# Patient Record
Sex: Female | Born: 1935 | Race: Black or African American | Hispanic: No | Smoking: Never smoker
Health system: Southern US, Community
[De-identification: ages and names within clinical notes are randomized; demographics above are authoritative.]

## PROBLEM LIST (undated history)

## (undated) DIAGNOSIS — K219 Gastro-esophageal reflux disease without esophagitis: Secondary | ICD-10-CM

## (undated) DIAGNOSIS — Z9989 Dependence on other enabling machines and devices: Secondary | ICD-10-CM

## (undated) DIAGNOSIS — M25512 Pain in left shoulder: Secondary | ICD-10-CM

## (undated) DIAGNOSIS — I10 Essential (primary) hypertension: Secondary | ICD-10-CM

## (undated) DIAGNOSIS — E119 Type 2 diabetes mellitus without complications: Secondary | ICD-10-CM

## (undated) DIAGNOSIS — E785 Hyperlipidemia, unspecified: Secondary | ICD-10-CM

## (undated) DIAGNOSIS — I509 Heart failure, unspecified: Secondary | ICD-10-CM

## (undated) DIAGNOSIS — M1990 Unspecified osteoarthritis, unspecified site: Secondary | ICD-10-CM

## (undated) DIAGNOSIS — J189 Pneumonia, unspecified organism: Secondary | ICD-10-CM

## (undated) DIAGNOSIS — I219 Acute myocardial infarction, unspecified: Secondary | ICD-10-CM

## (undated) DIAGNOSIS — I4891 Unspecified atrial fibrillation: Principal | ICD-10-CM

## (undated) DIAGNOSIS — G4733 Obstructive sleep apnea (adult) (pediatric): Secondary | ICD-10-CM

## (undated) DIAGNOSIS — C50912 Malignant neoplasm of unspecified site of left female breast: Secondary | ICD-10-CM

## (undated) HISTORY — PX: LAPAROSCOPIC CHOLECYSTECTOMY: SUR755

## (undated) HISTORY — PX: JOINT REPLACEMENT: SHX530

## (undated) HISTORY — PX: CATARACT EXTRACTION W/ INTRAOCULAR LENS  IMPLANT, BILATERAL: SHX1307

---

## 1996-02-07 DIAGNOSIS — C50912 Malignant neoplasm of unspecified site of left female breast: Secondary | ICD-10-CM

## 1996-02-07 HISTORY — PX: MASTECTOMY: SHX3

## 1996-02-07 HISTORY — PX: BREAST BIOPSY: SHX20

## 1996-02-07 HISTORY — DX: Malignant neoplasm of unspecified site of left female breast: C50.912

## 1999-02-07 HISTORY — PX: TOTAL KNEE ARTHROPLASTY: SHX125

## 2007-07-21 ENCOUNTER — Observation Stay (HOSPITAL_COMMUNITY): Admission: EM | Admit: 2007-07-21 | Discharge: 2007-07-22 | Payer: Self-pay | Admitting: Emergency Medicine

## 2008-09-29 ENCOUNTER — Ambulatory Visit: Payer: Self-pay | Admitting: Diagnostic Radiology

## 2008-09-29 ENCOUNTER — Emergency Department (HOSPITAL_BASED_OUTPATIENT_CLINIC_OR_DEPARTMENT_OTHER): Admission: EM | Admit: 2008-09-29 | Discharge: 2008-09-29 | Payer: Self-pay | Admitting: Emergency Medicine

## 2009-04-01 ENCOUNTER — Emergency Department (HOSPITAL_COMMUNITY): Admission: EM | Admit: 2009-04-01 | Discharge: 2009-04-01 | Payer: Self-pay | Admitting: Emergency Medicine

## 2010-04-27 LAB — D-DIMER, QUANTITATIVE: D-Dimer, Quant: 0.36 ug/mL-FEU (ref 0.00–0.48)

## 2010-04-27 LAB — POCT I-STAT, CHEM 8
Calcium, Ion: 1.1 mmol/L — ABNORMAL LOW (ref 1.12–1.32)
Creatinine, Ser: 0.9 mg/dL (ref 0.4–1.2)
HCT: 40 % (ref 36.0–46.0)
Hemoglobin: 13.6 g/dL (ref 12.0–15.0)
Sodium: 138 mEq/L (ref 135–145)
TCO2: 25 mmol/L (ref 0–100)

## 2010-04-27 LAB — POCT CARDIAC MARKERS
CKMB, poc: 2.7 ng/mL (ref 1.0–8.0)
Myoglobin, poc: 111 ng/mL (ref 12–200)
Troponin i, poc: <0.05 ng/mL (ref 0.00–0.09)

## 2010-04-27 LAB — CBC
MCV: 91.2 fL (ref 78.0–100.0)
WBC: 9.7 10*3/uL (ref 4.0–10.5)

## 2010-06-21 NOTE — Discharge Summary (Signed)
[name], [name]            ACCOUNT NO.:  [id number]   MEDICAL RECORD NO.:  [id number]          PATIENT TYPE:  OBV   LOCATION:  5502                         FACILITY:  [facility]   PHYSICIAN:  Altha Harm, MDDATE OF BIRTH:  February 24, 1933   DATE OF ADMISSION:  07/21/2007  DATE OF DISCHARGE:  07/22/2007                               DISCHARGE SUMMARY   DISCHARGE DISPOSITION:  Home.   DISCHARGE DIAGNOSES:  1. Chest pain, atypical.  2. Reported tachycardia, not observed in the hospital.  3. Bilateral paresthesias.  4. History of hypertension.  5. History of hyperlipidemia.  6. History of cancers of the breast, status post left-sided      mastectomy.   DISCHARGE MEDICATIONS:  1. Metoprolol 25 mg p.o. b.i.d.  2. Exforge 10/160 mg p.o. daily.  3. Aspirin 81 mg p.o. daily.  4. Fish oil 1 tablet p.o. daily.  5. Nitroglycerin 0.4 mg sublingual every 5 minutes p.r.n. pain.   CONSULTANTS:  None.   PROCEDURE:  None.   DIAGNOSTIC STUDIES:  1. CT head without contrast, which shows no acute intracranial      findings.  2. Chest x-ray 2 views, which shows mild cardiomegaly and minimal      bibasilar subsegmental atelectasis.   ALLERGIES:  No known drug allergies.   CODE STATUS:  Full code.   PRIMARY CARE PHYSICIAN:  The patient's primary care physician is in  [place], [place].   CHIEF COMPLAINT:  Chest pain and fast heartbeat.   HISTORY OF PRESENT ILLNESS:  Please see the H&P dictated by Dr.  Toniann Fail; however it is reported by the patient to me, the patient  states that she missed several doses of her metoprolol and started  having a fast heart rate starting yesterday.  She states that this has  happened in the past and her doctor had scheduled her to be seen by  cardiologist in [place] on August 13, 2007.  She states that in light of  the fact that she was away from home, she came to the emergency room for  further evaluation.   HOSPITAL COURSE:  The  patient was evaluated for resting cardiac ischemia  with serial enzymes and was found to have none.  The patient had no  tachyarrhythmias while hospitalized and was able to ambulate without any  difficulty or any tachyarrhythmias.  I have discussed this at length  with the patient and she agrees that she will return to [place] to  see her physician as she is out of town.  She also states that she would  rather follow up with her appointment already scheduled with her  cardiologist in [place] for August 13, 2007.   The patient is being discharged home on the above-stated medications.   PHYSICAL EXAMINATION:  VITAL SIGNS:  Today, her vital signs are stable.  Her heart rate is sinus rhythm in the 50s to 70s, blood pressure 119/59,  respiratory rate 14-16, and O2 sats are 96% on room air.  LUNGS: Clear to auscultation.  The patient has no further chest pain or  feelings of a fast heart rate at this  time.  HEART:  She has normal S1 and S2.  No murmurs, rubs, or gallops were  noted.  ABDOMEN:  Obese, soft, nontender, and nondistended.  No masses or  hepatosplenomegaly.  EXTREMITIES:  There are no clubbing, cyanosis, or edema noticed in the  extremities.   DIETARY RESTRICTIONS:  The patient should be on a heart-healthy diet.   PHYSICAL RESTRICTIONS:  None and I would continue her activity,  nonstrenuous activity.   DISCHARGE INSTRUCTIONS:  The patient has been instructed that if she has  any further chest pain, she is to return to the emergency room, if she  has not yet returned back to her home in the [place], [place]  here.  This has all been discussed with the patient's daughter in  addition.      Altha Harm, MD  Electronically Signed     MAM/MEDQ  D:  07/22/2007  T:  07/23/2007  Job:  [id number]

## 2010-06-21 NOTE — H&P (Signed)
[name], BOULE NO.:  [id number]   MEDICAL RECORD NO.:  [id number]          PATIENT TYPE:  EMS   LOCATION:  MAJO                         FACILITY:  [facility]   PHYSICIAN:  Eduard Clos, MDDATE OF BIRTH:  05-18-1933   DATE OF ADMISSION:  07/21/2007  DATE OF DISCHARGE:                              HISTORY & PHYSICAL   CHIEF COMPLAINT:  Chest pain.   HISTORY OF PRESENT ILLNESS:  A 77 year old female with known history of  hypertension presented to the ER complaining of chest pain.  The patient  has been having off and on chest pain for the last 2 weeks.  These  typically last around 10-15 minutes, retrosternal, present even at rest,  not related to any exertion.  In addition to chest pain, the patient  also has associated palpitations along with the chest pain.  Denies any  associated shortness of breath, dizziness, or loss of consciousness.  Does have some numbness in her hands which was also present along the  duration of the chest pain.  Numbness is present in both upper hands and  lower legs in a stocking glove pattern.  Denies any weakness of limbs,  dizziness, or loss of consciousness.  Denies fever, chills, headache,  abdominal pain, nausea, vomiting, or diarrhea.   PAST MEDICAL HISTORY:  Hypertension, history of CA of the breast status  post left-sided mastectomy.   PAST SURGICAL HISTORY:  Left-sided mastectomy for CA breast,  cholecystectomy, and bilateral knee replacement.   MEDICATIONS PRIOR TO ADMISSION:  1. Metoprolol 25 mg p.o. twice daily.  2. Exforge 10/160 mg p.o. daily.  3. Aspirin 81 mg p.o. daily.  4. Fish oil.   ALLERGIES:  No known drug allergies.   FAMILY HISTORY:  Significant for the patient's mother having MI at age  8.   SOCIAL HISTORY:  The patient denies smoking cigarettes, but does snuff  tobacco.  She has been advised to quit snuffing.  She denies any alcohol  or drug abuse.   REVIEW OF SYSTEMS:  As present in  history of present illness.  Nothing  else significant.   PHYSICAL EXAMINATION:  GENERAL:  The patient examined at bedside, not in  acute distress.  Denies any chest pain now.  VITAL SIGNS:  Blood pressure 152/78, pulse 78 per minute, temperature  98.7, respirations 18 per minute, O2 saturation 99%.  HEENT:  Anicteric, no pallor.  CHEST:  Bilateral air entry present.  No rhonchi and no crepitation.  HEART:  S1 and S2 heard.  ABDOMEN:  Soft, nontender, bowel sounds heard.  No guarding and no  rigidity.  NEUROLOGY:  Alert, awake, oriented to time, place, and person.  Moves  upper and lower extremities 5/5.  EXTREMITIES:  Peripheral pulses felt.  No edema.   LABORATORY DATA:  CT of the head shows nothing acute.   Chest x-ray; mild cardiomegaly.  Minimal bibasilar subsegmental  atelectasis.   EKG; normal sinus rhythm with T wave flattening in inferior leads and V1  and lateral leads.   CBC; WBC 9.3, hemoglobin 12.2, hematocrit 36, platelets 304.  Complete  metabolic panel; sodium  139, potassium 3.7, chloride 101, glucose 99,  BUN 7, creatinine 0.9, total bilirubin 0.6, alkaline phosphatase 89, AST  19, ALT 11, total protein 7.7, albumin 4.1.  CK-MB 1.5, troponin less  than 0.05.   ASSESSMENT:  1. Chest pain, rule out acute coronary syndrome.  2. Numbness in the upper and lower extremities probably from      peripheral neuropathy, unable to rule out any transient ischemic      attack.  3. History of hypertension.  4. History of cancer of breast status post left-sided mastectomy.   PLAN:  Admit the patient to telemetry.  We will follow serial cardiac  markers.  We will get an MRI of the brain, carotid ultrasound.  We will  get a two-dimensional echocardiogram and further recommendations as the  patient's condition evolves.      Eduard Clos, MD  Electronically Signed     ANK/MEDQ  D:  07/21/2007  T:  07/21/2007  Job:  [id number]

## 2010-11-03 LAB — HEPATIC FUNCTION PANEL

## 2010-11-03 LAB — CK TOTAL AND CKMB (NOT AT ARMC)
CK, MB: 2.1
Total CK: 203 — ABNORMAL HIGH

## 2010-11-03 LAB — CBC
Hemoglobin: 11.7 — ABNORMAL LOW
MCHC: 33.8
Platelets: 291
RBC: 3.85 — ABNORMAL LOW
RBC: 3.57 — ABNORMAL LOW
RDW: 14.4
WBC: 9.3
WBC: 8.1

## 2010-11-03 LAB — DIFFERENTIAL
Basophils Absolute: 0
Basophils Relative: 1
Eosinophils Absolute: 0.2
Eosinophils Relative: 2
Lymphocytes Relative: 32
Lymphs Abs: 3
Monocytes Absolute: 0.6
Monocytes Relative: 6
Neutro Abs: 5.5

## 2010-11-03 LAB — CARDIAC PANEL(CRET KIN+CKTOT+MB+TROPI)
CK, MB: 1.7
CK, MB: 1.8
Relative Index: 0.9
Total CK: 156
Troponin I: 0.01
Troponin I: <0.01

## 2010-11-03 LAB — LIPID PANEL
HDL: 48
LDL Cholesterol: 107 — ABNORMAL HIGH
Triglycerides: 88
VLDL: 18

## 2010-11-03 LAB — POCT I-STAT, CHEM 8
Calcium, Ion: 1.17
Chloride: 101
Glucose, Bld: 99
Potassium: 3.7

## 2010-11-03 LAB — TROPONIN I: Troponin I: <0.01

## 2010-11-03 LAB — POCT CARDIAC MARKERS
CKMB, poc: 1.5
CKMB, poc: 2.2
Myoglobin, poc: 80.8
Myoglobin, poc: 83.8
Operator id: 277751
Troponin i, poc: <0.05

## 2011-01-25 ENCOUNTER — Emergency Department (HOSPITAL_BASED_OUTPATIENT_CLINIC_OR_DEPARTMENT_OTHER)
Admission: EM | Admit: 2011-01-25 | Discharge: 2011-01-25 | Disposition: A | Discharge status: Home / Self Care | Payer: Medicare Other | Attending: Emergency Medicine | Admitting: Emergency Medicine

## 2011-01-25 ENCOUNTER — Encounter: Payer: Self-pay | Admitting: *Deleted

## 2011-01-25 DIAGNOSIS — M25519 Pain in unspecified shoulder: Secondary | ICD-10-CM | POA: Insufficient documentation

## 2011-01-25 DIAGNOSIS — M75100 Unspecified rotator cuff tear or rupture of unspecified shoulder, not specified as traumatic: Secondary | ICD-10-CM

## 2011-01-25 DIAGNOSIS — E119 Type 2 diabetes mellitus without complications: Secondary | ICD-10-CM | POA: Insufficient documentation

## 2011-01-25 DIAGNOSIS — M67919 Unspecified disorder of synovium and tendon, unspecified shoulder: Secondary | ICD-10-CM | POA: Insufficient documentation

## 2011-01-25 DIAGNOSIS — M719 Bursopathy, unspecified: Secondary | ICD-10-CM | POA: Insufficient documentation

## 2011-01-25 DIAGNOSIS — I10 Essential (primary) hypertension: Secondary | ICD-10-CM | POA: Insufficient documentation

## 2011-01-25 HISTORY — DX: Essential (primary) hypertension: I10

## 2011-01-25 MED ORDER — OXYCODONE-ACETAMINOPHEN 5-325 MG PO TABS: 2.0000 | ORAL_TABLET | ORAL | Status: AC | PRN

## 2011-01-25 NOTE — ED Notes (Signed)
3 week history of left shoulder pain extending down to elbow no injury reported saw pmd last week given tramadol told it is arthritis but pt states not helping at all

## 2011-01-25 NOTE — ED Provider Notes (Signed)
History     CSN: [id number] Arrival date & time: 01/25/2011  9:59 AM   First MD Initiated Contact with Patient 01/25/11 1025      Chief Complaint  Patient presents with  . Shoulder Pain   patient has had chronic left shoulder pain for the past 3 weeks. Especially worsened with range of motion of the joints or palpation. She was seen by her primary care physician last week and given a prescription for tramadol and diagnosed with arthritis. She states she is not sure if she was referred to an orthopedist. The pain is persisting and she states the tramadol is not really helping. She has had no chest pain, no numbness, no diaphoresis. Denies any fevers. No respiratory symptoms. She also has an appointment with her cancer doctor on January 8, which she plans to keep.  (Consider location/radiation/quality/duration/timing/severity/associated sxs/prior treatment) HPI  Past Medical History  Diagnosis Date  . Diabetes mellitus   . Hypertension     History reviewed. No pertinent past surgical history.  History reviewed. No pertinent family history.  History  Substance Use Topics  . Smoking status: Former Games developer  . Smokeless tobacco: Not on file  . Alcohol Use: No    OB History    Grav Para Term Preterm Abortions TAB SAB Ect Mult Living                  Review of Systems  All other systems reviewed and are negative.    Allergies  Review of patient's allergies indicates no known allergies.  Home Medications   Current Outpatient Rx  Name Route Sig Dispense Refill  . AMLODIPINE BESYLATE 5 MG PO TABS Oral Take 5 mg by mouth daily.      Marland Kitchen DRONEDARONE HCL 400 MG PO TABS Oral Take 400 mg by mouth 2 (two) times daily with a meal.      . METFORMIN HCL 500 MG PO TABS Oral Take 500 mg by mouth 2 (two) times daily with a meal.      . METOPROLOL TARTRATE 50 MG PO TABS Oral Take 50 mg by mouth 2 (two) times daily.      . TRAMADOL HCL 50 MG PO TABS Oral Take 50 mg by mouth every 6 (six)  hours as needed. Maximum dose= 8 tablets per day     . OXYCODONE-ACETAMINOPHEN 5-325 MG PO TABS Oral Take 2 tablets by mouth every 4 (four) hours as needed for pain. 15 tablet 0    BP 165/87  Pulse 88  Temp(Src) 98.4 F (36.9 C) (Oral)  Resp 20  SpO2 99%  Physical Exam  Nursing note and vitals reviewed. Constitutional: She is oriented to person, place, and time. She appears well-developed and well-nourished.  HENT:  Head: Normocephalic and atraumatic.  Eyes: Conjunctivae and EOM are normal. Pupils are equal, round, and reactive to light.  Neck: Neck supple.  Cardiovascular: Normal rate and regular rhythm.  Exam reveals no gallop and no friction rub.   No murmur heard. Pulmonary/Chest: Breath sounds normal. She has no wheezes. She has no rales. She exhibits no tenderness.  Abdominal: Soft. Bowel sounds are normal. She exhibits no distension. There is no tenderness. There is no rebound and no guarding.  Musculoskeletal: She exhibits tenderness. She exhibits no edema.       Pulses are equal and symmetric. No redness, swelling, or edema. Painful range of motion of the left shoulder joint. Diffuse tenderness. No crepitance. No other abnormal finding.  Neurological: She is  alert and oriented to person, place, and time. No cranial nerve deficit. Coordination normal.  Skin: Skin is warm and dry. No rash noted.  Psychiatric: She has a normal mood and affect.    ED Course  Procedures (including critical care time)  Labs Reviewed - No data to display No results found.   1. Shoulder pain   2. Rotator cuff syndrome       MDM  Pt is seen and examined;  Initial history and physical completed.  Will follow.          Trevell Pariseau A. Patrica Duel, MD 01/25/11 1038

## 2011-05-03 ENCOUNTER — Emergency Department (INDEPENDENT_AMBULATORY_CARE_PROVIDER_SITE_OTHER): Payer: Medicare Other

## 2011-05-03 ENCOUNTER — Other Ambulatory Visit: Payer: Self-pay

## 2011-05-03 ENCOUNTER — Encounter (HOSPITAL_BASED_OUTPATIENT_CLINIC_OR_DEPARTMENT_OTHER): Payer: Self-pay | Admitting: Emergency Medicine

## 2011-05-03 ENCOUNTER — Inpatient Hospital Stay (HOSPITAL_BASED_OUTPATIENT_CLINIC_OR_DEPARTMENT_OTHER)
Admission: EM | Admit: 2011-05-03 | Discharge: 2011-05-05 | DRG: 310 | Disposition: A | Discharge status: Home / Self Care | Payer: Medicare Other | Source: Ambulatory Visit | Attending: Internal Medicine | Admitting: Internal Medicine

## 2011-05-03 DIAGNOSIS — M25512 Pain in left shoulder: Secondary | ICD-10-CM | POA: Diagnosis present

## 2011-05-03 DIAGNOSIS — E785 Hyperlipidemia, unspecified: Secondary | ICD-10-CM | POA: Diagnosis present

## 2011-05-03 DIAGNOSIS — Z79899 Other long term (current) drug therapy: Secondary | ICD-10-CM

## 2011-05-03 DIAGNOSIS — M25519 Pain in unspecified shoulder: Secondary | ICD-10-CM | POA: Diagnosis present

## 2011-05-03 DIAGNOSIS — Z853 Personal history of malignant neoplasm of breast: Secondary | ICD-10-CM

## 2011-05-03 DIAGNOSIS — E119 Type 2 diabetes mellitus without complications: Secondary | ICD-10-CM | POA: Diagnosis present

## 2011-05-03 DIAGNOSIS — R079 Chest pain, unspecified: Secondary | ICD-10-CM

## 2011-05-03 DIAGNOSIS — K219 Gastro-esophageal reflux disease without esophagitis: Secondary | ICD-10-CM | POA: Diagnosis present

## 2011-05-03 DIAGNOSIS — I4891 Unspecified atrial fibrillation: Principal | ICD-10-CM | POA: Diagnosis present

## 2011-05-03 DIAGNOSIS — I10 Essential (primary) hypertension: Secondary | ICD-10-CM | POA: Diagnosis present

## 2011-05-03 DIAGNOSIS — I509 Heart failure, unspecified: Secondary | ICD-10-CM

## 2011-05-03 DIAGNOSIS — I517 Cardiomegaly: Secondary | ICD-10-CM

## 2011-05-03 DIAGNOSIS — R0602 Shortness of breath: Secondary | ICD-10-CM

## 2011-05-03 HISTORY — DX: Gastro-esophageal reflux disease without esophagitis: K21.9

## 2011-05-03 HISTORY — DX: Pain in left shoulder: M25.512

## 2011-05-03 HISTORY — DX: Hyperlipidemia, unspecified: E78.5

## 2011-05-03 HISTORY — DX: Unspecified atrial fibrillation: I48.91

## 2011-05-03 HISTORY — DX: Malignant neoplasm of unspecified site of left female breast: C50.912

## 2011-05-03 LAB — BASIC METABOLIC PANEL
Calcium: 9.7 mg/dL (ref 8.4–10.5)
Creatinine, Ser: 1 mg/dL (ref 0.50–1.10)
GFR calc Af Amer: 62 mL/min — ABNORMAL LOW (ref >90)

## 2011-05-03 LAB — DIFFERENTIAL
Basophils Absolute: 0 10*3/uL (ref 0.0–0.1)
Basophils Relative: 0 % (ref 0–1)
Eosinophils Relative: 2 % (ref 0–5)
Monocytes Absolute: 0.6 10*3/uL (ref 0.1–1.0)

## 2011-05-03 LAB — CBC
HCT: 38.3 % (ref 36.0–46.0)
MCH: 30.4 pg (ref 26.0–34.0)
MCHC: 33.7 g/dL (ref 30.0–36.0)
MCV: 90.3 fL (ref 78.0–100.0)
RDW: 14.1 % (ref 11.5–15.5)

## 2011-05-03 LAB — GLUCOSE, CAPILLARY
Glucose-Capillary: 69 mg/dL — ABNORMAL LOW (ref 70–99)
Glucose-Capillary: 81 mg/dL (ref 70–99)

## 2011-05-03 LAB — APTT: aPTT: 34 seconds (ref 24–37)

## 2011-05-03 MED ORDER — ZOLPIDEM TARTRATE 5 MG PO TABS: 5.0000 mg | ORAL_TABLET | Freq: Every evening | ORAL | Status: DC | PRN

## 2011-05-03 MED ORDER — SODIUM CHLORIDE 0.9 % IJ SOLN
3.0000 mL | Freq: Two times a day (BID) | INTRAMUSCULAR | Status: DC
  Administered 2011-05-04: 3 mL via INTRAVENOUS

## 2011-05-03 MED ORDER — PREGABALIN 50 MG PO CAPS
150.0000 mg | ORAL_CAPSULE | Freq: Every day | ORAL | Status: DC
Start: 2011-05-03 — End: 2011-05-05
  Administered 2011-05-03 – 2011-05-04 (×2): 150 mg via ORAL
  Filled 2011-05-03: qty 1
  Filled 2011-05-03: qty 2
  Filled 2011-05-03: qty 3

## 2011-05-03 MED ORDER — RIVAROXABAN 10 MG PO TABS
20.0000 mg | ORAL_TABLET | Freq: Every day | ORAL | Status: DC
  Administered 2011-05-03 – 2011-05-05 (×3): 20 mg via ORAL
  Filled 2011-05-03 (×3): qty 2

## 2011-05-03 MED ORDER — ASPIRIN 81 MG PO CHEW
CHEWABLE_TABLET | ORAL | Status: AC
  Administered 2011-05-03: 324 mg
  Filled 2011-05-03: qty 4

## 2011-05-03 MED ORDER — DILTIAZEM HCL 25 MG/5ML IV SOLN
10.0000 mg | Freq: Once | INTRAVENOUS | Status: AC
  Administered 2011-05-03: 10 mg via INTRAVENOUS
  Filled 2011-05-03: qty 5

## 2011-05-03 MED ORDER — SODIUM CHLORIDE 0.9 % IJ SOLN: 3.0000 mL | INTRAMUSCULAR | Status: DC | PRN

## 2011-05-03 MED ORDER — DILTIAZEM HCL 100 MG IV SOLR
INTRAVENOUS | Status: AC
  Administered 2011-05-03: 16:00:00
  Filled 2011-05-03: qty 100

## 2011-05-03 MED ORDER — INSULIN ASPART 100 UNIT/ML ~~LOC~~ SOLN: 0.0000 [IU] | Freq: Three times a day (TID) | SUBCUTANEOUS | Status: DC

## 2011-05-03 MED ORDER — ASPIRIN 81 MG PO TABS
324.0000 mg | ORAL_TABLET | Freq: Once | ORAL | Status: DC
  Administered 2011-05-03: 324 mg via ORAL

## 2011-05-03 MED ORDER — NITROGLYCERIN 0.4 MG SL SUBL: 0.4000 mg | SUBLINGUAL_TABLET | SUBLINGUAL | Status: DC | PRN

## 2011-05-03 MED ORDER — DILTIAZEM HCL 60 MG PO TABS
120.0000 mg | ORAL_TABLET | Freq: Every day | ORAL | Status: DC
  Filled 2011-05-03: qty 2

## 2011-05-03 MED ORDER — ASPIRIN 81 MG PO CHEW: 324.0000 mg | CHEWABLE_TABLET | Freq: Once | ORAL | Status: DC

## 2011-05-03 MED ORDER — SODIUM CHLORIDE 0.9 % IV BOLUS (SEPSIS)
500.0000 mL | Freq: Once | INTRAVENOUS | Status: AC
  Administered 2011-05-03: 500 mL via INTRAVENOUS

## 2011-05-03 MED ORDER — SODIUM CHLORIDE 0.9 % IV SOLN: 250.0000 mL | INTRAVENOUS | Status: DC | PRN

## 2011-05-03 MED ORDER — ONDANSETRON HCL 4 MG/2ML IJ SOLN: 4.0000 mg | Freq: Four times a day (QID) | INTRAMUSCULAR | Status: DC | PRN

## 2011-05-03 MED ORDER — DILTIAZEM HCL 100 MG IV SOLR
5.0000 mg/h | Freq: Once | INTRAVENOUS | Status: AC
  Administered 2011-05-03: 5 mg/h via INTRAVENOUS

## 2011-05-03 MED ORDER — ACETAMINOPHEN 325 MG PO TABS
650.0000 mg | ORAL_TABLET | ORAL | Status: DC | PRN
  Administered 2011-05-04: 650 mg via ORAL
  Filled 2011-05-03: qty 2

## 2011-05-03 MED ORDER — METOPROLOL TARTRATE 50 MG PO TABS
50.0000 mg | ORAL_TABLET | Freq: Two times a day (BID) | ORAL | Status: DC
  Administered 2011-05-04 – 2011-05-05 (×3): 50 mg via ORAL
  Filled 2011-05-03 (×5): qty 1

## 2011-05-03 NOTE — H&P (Signed)
History and Physical  Patient ID: DILLYN JOAQUIN MRN: [id number], SOB: 07/17/1933 78 y.o. Date of Encounter: 05/03/2011, 9:13 PM  Primary Physician:  Primary Cardiologist: New to [facility]  Chief Complaint: Left shoulder pain and A.fib w/ RVR  HPI: 78 y.o. female w/ PMHx significant for A.Fib, HTN, HLD, and diabetes who presented to [facility] with c/o of left shoulder pain and was found to be in A. Fib w/ RVR and transferred to [facility] on 05/03/2011 .  She reports being diagnosed with A.Fib about 57yrs ago at which time she was placed on metoprolol and Multaq, but no anticoagulation. She denies DCCV. She moved to [place] to live with her daughter 3 years ago and has not seen cardiology since moving. She reports having chronic left shoulder pain for about three years. It was evaluated with a MRI in January and was told she has a pinched nerve for which she has received injections. She has had worsening of her pain over the last 2 months that has not been relieved with oxycodone or lyrica so she [facility]. While there she was noted to be in A. Fib w/ rates 150s. She reports feeling palpitations with mild dizziness this morning and states that she has occasional palpitations, maybe every other week, but usually only when she is in pain. She denies chest pain, sob, or syncope. She states she has "poor balance" but denies falls. She is not active and therefore unable to determine whether she experiences exertional symptoms. She denies recent illness, fever, chills, changes in bladder or bowels. She does report that she doesn't always take her meds as prescribed, stating she is forgetful.   At the [facility] labs were unremarkable with normal troponin, CXR revealed minimal CHF with stable cardiomegaly and minimal interstitial pulmonary edema. She received IV cardizem followed by a drip with improvement of heart rates 80s-90s. Upon arrival to [facility] she is still in A. Fib with rates in the 60s-70s on 5mg /hr. She denies chest pain, sob, or palpitations. She is euvolemic on exam.  Past Medical History  Diagnosis Date  . Diabetes mellitus   . Hypertension   . Atrial fibrillation     Diagnosed ~2009  . GERD (gastroesophageal reflux disease)   . Breast cancer, left breast     S/p chemo and mastectomy 1998  . Hyperlipidemia   . Left shoulder pain     "MRI showed pinched nerve" - per pt     Surgical History:  Past Surgical History  Procedure Date  . Cholecystectomy   . Knee surgery     left  . Mastectomy     left, 1998     Home Meds: Medication Sig  amLODipine (NORVASC) 5 MG tablet Take 5 mg by mouth daily.    dronedarone (MULTAQ) 400 MG tablet Take 400 mg by mouth 2 (two) times daily with a meal.    metFORMIN (GLUCOPHAGE) 500 MG tablet Take 500 mg by mouth 2 (two) times daily with a meal.    metoprolol (LOPRESSOR) 50 MG tablet Take 50 mg by mouth 2 (two) times daily.    pregabalin (LYRICA) 75 MG capsule Take 150 mg by mouth at bedtime.     Allergies: No Known Allergies  Social History  . Marital Status: Single   Occupational History  . Retired   Social History Main Topics  . Smoking status: Never Smoker   . Smokeless tobacco: Current User  Types: Chew  . Alcohol Use: No  . Drug Use: No  . Sexually Active: No   Social History Narrative   Moved from the [place] area to [place] ~ 84yrs ago to live with her daughter     Family History  Problem Relation Age of Onset  . Heart attack Mother     63s    Review of Systems General: negative for chills, fever, night sweats or weight changes.  Cardiovascular: (+) palpitations, right ankle edema; negative for chest pain, shortness of breath, dyspnea on exertion, orthopnea, paroxysmal nocturnal dyspnea  Dermatological: negative for rash Respiratory: negative for cough or wheezing Urologic: negative for hematuria Msk: (+) Left shoulder pain Abdominal:  negative for nausea, vomiting, diarrhea, bright red blood per rectum, melena, or hematemesis Neurologic: (+) Dizziness; negative for visual changes, syncope All other systems reviewed and are otherwise negative except as noted above.  Labs:  Component Value Date   WBC 6.8 05/03/2011   HGB 12.9 05/03/2011   HCT 38.3 05/03/2011   MCV 90.3 05/03/2011   PLT 331 05/03/2011    Lab 05/03/11 1505  NA 138  K 3.7  CL 102  CO2 26  BUN 13  CREATININE 1.00  CALCIUM 9.7  GLUCOSE 102*   Basename 05/03/11 1505  CKTOTAL --  CKMB --  TROPONINI <0.30     Radiology/Studies:  Dg Chest Portable 1 View  05/03/2011  *RADIOLOGY REPORT*  Clinical Data: Chest pain.  Shortness of breath.  Dizziness. History diabetes, hypertension, and atrial fibrillation.  PORTABLE CHEST - 1 VIEW 05/03/2011 1530 hours:  Comparison: Portable chest x-ray 04/01/2009 and two-view chest x- ray 07/21/2007 [facility].  Findings: Cardiac silhouette enlarged but stable, allowing for differences in technique.  Thoracic aorta tortuous atherosclerotic, unchanged.  Hilar and mediastinal contours otherwise unremarkable. Pulmonary venous hypertension with perhaps minimal interstitial pulmonary edema.  No confluent airspace consolidation.  Prior left mastectomy and axillary node dissection.  IMPRESSION: Minimal/incipient CHF, with stable cardiomegaly and perhaps minimal interstitial pulmonary edema.  Original Report Authenticated By: Arnell Sieving, M.D.     EKG: 05/03/11 @ 1459 - A. Fib w/ RVR 150bpm  05/03/11 @ 1525 - A. Fib   Physical Exam: Blood pressure 131/80, pulse 77, temperature 98.1 F (36.7 C), temperature source Oral, resp. rate 16, height 5\' 6"  (1.676 m), weight 150 lb (68.04 kg), SpO2 97.00%. General: Elderly black female in no acute distress. Head: Normocephalic, atraumatic, sclera non-icteric, nares are without discharge Neck: Supple. Negative for carotid bruits. JVD not elevated. Chest: Left mastectomy Lungs:  Distant, diminished breath sounds throughout without wheezes, rales, or rhonchi. Breathing is unlabored. Heart: Irregularly irregular with S1 S2. Modest systolic murmur, no rub or gallop appreciated. Abdomen: Soft, non-tender, non-distended with normoactive bowel sounds. No rebound/guarding. No obvious abdominal masses. Msk:  Strength and tone appear normal for age. Extremities: No edema. No clubbing or cyanosis. Distal pedal pulses are 2+ and equal bilaterally. Neuro: Alert and oriented X 3. Moves all extremities spontaneously. Psych:  Responds to questions appropriately with a normal affect.    ASSESSMENT AND PLAN:  78 y.o. female w/ PMHx significant for A.Fib, HTN, HLD, and DMII who presented to [facility] with c/o of left shoulder pain and was found to be in A. Fib w/ RVR and transferred to [facility] on 05/03/2011 .  1. A. Fib w/ RVR: Has a history of A. Fib diagnosed about 39yrs ago and placed on BB and Multaq. Was seen at [facility]  in 2009 presenting with possible tachyarrhythmia but found to be in NSR. [facility] with shoulder pain and found to be in a.fib w/ RVR. Unsure of onset. Rate controlled with Cardizem. No anginal symptoms. CHADS2 score 3 (HTN, Age, DM; Echo pending to assess LV function). Will need anticoagulation, but due to questionable medication compliance would favor Xarelto. Will start now and have case management help her with determining cost. CrCl 52.89ml/min. Stop Multaq and Amlodipine. Cont Metoprolol. Cont IV cardizem over night and switch to oral in the morning. Check TSH.  2. HTN: Stable. Meds as above  3. HLD: Lipid panel & LFTs in AM. With a history of diabetes, she will likely require treatment with a statin.  4. DMII: Hold Metformin. Place on SSI. Check A1C.  Signed, HOPE, JESSICA PA-C 05/03/2011, 9:13 PM   Cardiology Attending Patient interviewed and examined. Discussed with [facility].  Above note annotated and  modified based upon my findings.  H/O AF, but the pattern of occurrence is uncertain.  Likely has PAF that had been fairly well contolled with dronedarone.  We need records from pt's cardiologist in [place] and PCP in [place].  For now antiarrhythmics will be discontinued, and we will pursue a rate control strategy with anticoagulation.  TSH and echo pending.  CXR interpreted as mild pulmonary edema, but there are no signs of CHF on exam.  Will defer treatment for now and check a BNP level.  Convert from IV to PO diltiazem in AM.  If she is doing and feeling well, consider discharge tomorrow afternoon.  Albion Bing, MD 05/03/2011, 10:45 PM

## 2011-05-03 NOTE — Progress Notes (Signed)
cardizem iv that patient had infusing on admission is cut off.

## 2011-05-03 NOTE — ED Notes (Signed)
Pt. Is in A Fib at present time.

## 2011-05-03 NOTE — ED Notes (Signed)
Pt. Has IV Cardizem infusing with no s/s of distress .

## 2011-05-03 NOTE — ED Notes (Signed)
Pt declines w/c, amb to room, gait noted to be unsteady initially, pt states "I feel dizzy..." pt reports left sided intermittant chest pain x Thursday with sob, and dizzyness onset today.

## 2011-05-03 NOTE — ED Notes (Signed)
Informed Pt. And Pt. Family of being transported by [facility] soon.

## 2011-05-03 NOTE — ED Provider Notes (Addendum)
History     CSN: [id number]  Arrival date & time 05/03/11  1448   First MD Initiated Contact with Patient 05/03/11 1501      Chief Complaint  Patient presents with  . Chest Pain  . Shortness of Breath  . Dizziness    (Consider location/radiation/quality/duration/timing/severity/associated sxs/prior treatment) HPI Comments: Patient presents today for her left shoulder and arm pain.  She notes that this is been ongoing for several weeks to months.  She's also noted over the last few days that she's felt some mild intermittent left-sided chest pain and some dizziness.  She's noted that she's had a problem with elevated heart rate before and believes this may be going on again today.  She's not currently taking any antiplatelet or anticoagulation agents.  She does note that he ongoing shoulder and neck pain she did receive some steroid injections.  Patient denies any prior coronary artery disease her cardiac stenting.  She does have a history of hypertension.  Patient is a 78 y.o. female presenting with shoulder pain. The history is provided by the patient. No language interpreter was used.  Shoulder Pain Associated symptoms include chest pain and shortness of breath. Pertinent negatives include no abdominal pain and no headaches.    Past Medical History  Diagnosis Date  . Diabetes mellitus   . Hypertension   . Atrial fibrillation   . GERD (gastroesophageal reflux disease)     Past Surgical History  Procedure Date  . Cholecystectomy   . Knee surgery     History reviewed. No pertinent family history.  History  Substance Use Topics  . Smoking status: Former Games developer  . Smokeless tobacco: Not on file  . Alcohol Use: No    OB History    Grav Para Term Preterm Abortions TAB SAB Ect Mult Living                  Review of Systems  Constitutional: Negative.  Negative for fever and chills.  HENT: Negative.   Eyes: Negative.  Negative for discharge and redness.  Respiratory:  Positive for shortness of breath. Negative for cough.   Cardiovascular: Positive for chest pain.  Gastrointestinal: Negative.  Negative for nausea, vomiting, abdominal pain and diarrhea.  Genitourinary: Negative.  Negative for dysuria and vaginal discharge.  Musculoskeletal: Negative for back pain.  Skin: Negative.  Negative for color change and rash.  Neurological: Positive for dizziness. Negative for syncope and headaches.  Hematological: Negative.  Negative for adenopathy.  Psychiatric/Behavioral: Negative.  Negative for confusion.  All other systems reviewed and are negative.    Allergies  Review of patient's allergies indicates no known allergies.  Home Medications   Current Outpatient Rx  Name Route Sig Dispense Refill  . AMLODIPINE BESYLATE 5 MG PO TABS Oral Take 5 mg by mouth daily.      Marland Kitchen DRONEDARONE HCL 400 MG PO TABS Oral Take 400 mg by mouth 2 (two) times daily with a meal.      . METFORMIN HCL 500 MG PO TABS Oral Take 500 mg by mouth 2 (two) times daily with a meal.      . METOPROLOL TARTRATE 50 MG PO TABS Oral Take 50 mg by mouth 2 (two) times daily.      . TRAMADOL HCL 50 MG PO TABS Oral Take 50 mg by mouth every 6 (six) hours as needed. Maximum dose= 8 tablets per day       BP 128/87  Pulse 113  Temp(Src) 98.4  F (36.9 C) (Oral)  Resp 18  Ht 5\' 6"  (1.676 m)  Wt 150 lb (68.04 kg)  BMI 24.21 kg/m2  SpO2 100%  Physical Exam  Nursing note and vitals reviewed. Constitutional: She is oriented to person, place, and time. She appears well-developed and well-nourished.  Non-toxic appearance. She does not have a sickly appearance.  HENT:  Head: Normocephalic and atraumatic.  Eyes: Conjunctivae, EOM and lids are normal. Pupils are equal, round, and reactive to light. No scleral icterus.  Neck: Trachea normal and normal range of motion. Neck supple.  Cardiovascular: S1 normal, S2 normal and normal heart sounds.  An irregularly irregular rhythm present. Tachycardia  present.   Pulmonary/Chest: Effort normal and breath sounds normal. No respiratory distress. She has no wheezes. She has no rales.  Abdominal: Soft. Normal appearance. There is no tenderness. There is no rebound, no guarding and no CVA tenderness.  Musculoskeletal: Normal range of motion.  Neurological: She is alert and oriented to person, place, and time. She has normal strength.  Skin: Skin is warm, dry and intact. No rash noted.  Psychiatric: She has a normal mood and affect. Her behavior is normal. Judgment and thought content normal.    ED Course  Procedures (including critical care time)  Results for orders placed during the hospital encounter of 05/03/11  CBC      Component Value Range   WBC 6.8  4.0 - 10.5 (K/uL)   RBC 4.24  3.87 - 5.11 (MIL/uL)   Hemoglobin 12.9  12.0 - 15.0 (g/dL)   HCT 84.6  96.2 - 95.2 (%)   MCV 90.3  78.0 - 100.0 (fL)   MCH 30.4  26.0 - 34.0 (pg)   MCHC 33.7  30.0 - 36.0 (g/dL)   RDW 84.1  32.4 - 40.1 (%)   Platelets 331  150 - 400 (K/uL)  DIFFERENTIAL      Component Value Range   Neutrophils Relative 48  43 - 77 (%)   Neutro Abs 3.2  1.7 - 7.7 (K/uL)   Lymphocytes Relative 42  12 - 46 (%)   Lymphs Abs 2.9  0.7 - 4.0 (K/uL)   Monocytes Relative 8  3 - 12 (%)   Monocytes Absolute 0.6  0.1 - 1.0 (K/uL)   Eosinophils Relative 2  0 - 5 (%)   Eosinophils Absolute 0.1  0.0 - 0.7 (K/uL)   Basophils Relative 0  0 - 1 (%)   Basophils Absolute 0.0  0.0 - 0.1 (K/uL)  BASIC METABOLIC PANEL      Component Value Range   Sodium 138  135 - 145 (mEq/L)   Potassium 3.7  3.5 - 5.1 (mEq/L)   Chloride 102  96 - 112 (mEq/L)   CO2 26  19 - 32 (mEq/L)   Glucose, Bld 102 (*) 70 - 99 (mg/dL)   BUN 13  6 - 23 (mg/dL)   Creatinine, Ser 0.27  0.50 - 1.10 (mg/dL)   Calcium 9.7  8.4 - 25.3 (mg/dL)   GFR calc non Af Amer 54 (*) >90 (mL/min)   GFR calc Af Amer 62 (*) >90 (mL/min)  APTT      Component Value Range   aPTT 34  24 - 37 (seconds)  PROTIME-INR      Component  Value Range   Prothrombin Time 13.8  11.6 - 15.2 (seconds)   INR 1.04  0.00 - 1.49   TROPONIN I      Component Value Range   Troponin I <0.30  <  0.30 (ng/mL)   Dg Chest Portable 1 View  05/03/2011  *RADIOLOGY REPORT*  Clinical Data: Chest pain.  Shortness of breath.  Dizziness. History diabetes, hypertension, and atrial fibrillation.  PORTABLE CHEST - 1 VIEW 05/03/2011 1530 hours:  Comparison: Portable chest x-ray 04/01/2009 and two-view chest x- ray 07/21/2007 [facility].  Findings: Cardiac silhouette enlarged but stable, allowing for differences in technique.  Thoracic aorta tortuous atherosclerotic, unchanged.  Hilar and mediastinal contours otherwise unremarkable. Pulmonary venous hypertension with perhaps minimal interstitial pulmonary edema.  No confluent airspace consolidation.  Prior left mastectomy and axillary node dissection.  IMPRESSION: Minimal/incipient CHF, with stable cardiomegaly and perhaps minimal interstitial pulmonary edema.  Original Report Authenticated By: Arnell Sieving, M.D.       Date: 05/03/2011  Rate: 150  Rhythm: atrial fibrillation  QRS Axis: normal  Intervals: normal  ST/T Wave abnormalities: nonspecific ST/T changes  Conduction Disutrbances:none  Narrative Interpretation:   Old EKG Reviewed: changed from old ECG from 04/01/09 where pt was in NSR, but other old ECGs do show a-fib but unclear of date of those ECGs (not labeled on ECG)   Date: 05/03/2011 repeat @1525   Rate: 99  Rhythm: atrial fibrillation  QRS Axis: normal  Intervals: normal  ST/T Wave abnormalities: Flattened T waves diffusely  Conduction Disutrbances:none  Narrative Interpretation:   Old EKG Reviewed: changes noted from earlier ECG with RVR   MDM  Patient with A. fib with RVR on her initial EKG.  Patient was given an aspirin here and will be placed on a diltiazem bolus and interpret treatment.  Her heart rate has decreased to 80s-90s.  Her blood pressures have maintained  normal levels.  Patient's cardiac markers are normal and her laboratory studies.  Her oxygenation is good so although there is possible minimal CHF on her chest x-ray clinically the patient is not having any difficulty with her breathing.  I do believe this patient will warrant transfer for admission for A. fib with RVR.  Patient prefers to go to [facility] and does not have a cardiologist of her own but I will contact the cardiology service on call for unassigned patients for transfer potentially to their service.  Patient's primary care physician is unclear as multiple papers from the patient mentioned of her doctors and she's not sure which one of them is her primary care physician.  CRITICAL CARE Performed by: Emeline General A   Total critical care time: 35 minutes  Critical care time was exclusive of separately billable procedures and treating other patients.  Critical care was necessary to treat or prevent imminent or life-threatening deterioration.  Critical care was time spent personally by me on the following activities: development of treatment plan with patient and/or surrogate as well as nursing, discussions with consultants, evaluation of patient's response to treatment, examination of patient, obtaining history from patient or surrogate, ordering and performing treatments and interventions, ordering and review of laboratory studies, ordering and review of radiographic studies, pulse oximetry and re-evaluation of patient's condition.         Nat Christen, MD 05/03/11 1606  Pt discussed with Dr. Johney Frame from cardiology and he accepted the pt to a tele bed.    Nat Christen, MD 05/03/11 1626

## 2011-05-04 ENCOUNTER — Other Ambulatory Visit: Payer: Self-pay

## 2011-05-04 ENCOUNTER — Inpatient Hospital Stay (HOSPITAL_COMMUNITY): Payer: Medicare Other

## 2011-05-04 DIAGNOSIS — I059 Rheumatic mitral valve disease, unspecified: Secondary | ICD-10-CM

## 2011-05-04 LAB — LIPID PANEL
Cholesterol: 186 mg/dL (ref 0–200)
HDL: 53 mg/dL (ref >39)
Total CHOL/HDL Ratio: 3.5 RATIO
VLDL: 31 mg/dL (ref 0–40)

## 2011-05-04 LAB — COMPREHENSIVE METABOLIC PANEL WITH GFR
ALT: 8 U/L (ref 0–35)
AST: 17 U/L (ref 0–37)
Albumin: 3.3 g/dL — ABNORMAL LOW (ref 3.5–5.2)
Alkaline Phosphatase: 60 U/L (ref 39–117)
BUN: 10 mg/dL (ref 6–23)
CO2: 22 meq/L (ref 19–32)
Calcium: 9.2 mg/dL (ref 8.4–10.5)
Chloride: 106 meq/L (ref 96–112)
Creatinine, Ser: 0.77 mg/dL (ref 0.50–1.10)
GFR calc Af Amer: >90 mL/min
GFR calc non Af Amer: 80 mL/min — ABNORMAL LOW
Glucose, Bld: 98 mg/dL (ref 70–99)
Potassium: 4.2 meq/L (ref 3.5–5.1)
Sodium: 138 meq/L (ref 135–145)
Total Bilirubin: 0.3 mg/dL (ref 0.3–1.2)
Total Protein: 6.7 g/dL (ref 6.0–8.3)

## 2011-05-04 LAB — GLUCOSE, CAPILLARY
Glucose-Capillary: 89 mg/dL (ref 70–99)
Glucose-Capillary: 83 mg/dL (ref 70–99)

## 2011-05-04 LAB — CBC
HCT: 36.2 % (ref 36.0–46.0)
Hemoglobin: 11.9 g/dL — ABNORMAL LOW (ref 12.0–15.0)
MCH: 30.5 pg (ref 26.0–34.0)
MCHC: 32.9 g/dL (ref 30.0–36.0)
MCV: 92.8 fL (ref 78.0–100.0)
Platelets: 290 10*3/uL (ref 150–400)
RBC: 3.9 MIL/uL (ref 3.87–5.11)
RDW: 14.5 % (ref 11.5–15.5)
WBC: 6.2 10*3/uL (ref 4.0–10.5)

## 2011-05-04 LAB — TSH: TSH: 1.007 u[IU]/mL (ref 0.350–4.500)

## 2011-05-04 LAB — HEMOGLOBIN A1C
Hgb A1c MFr Bld: 6.6 % — ABNORMAL HIGH (ref <5.7)
Mean Plasma Glucose: 143 mg/dL — ABNORMAL HIGH (ref <117)

## 2011-05-04 MED ORDER — DILTIAZEM HCL ER COATED BEADS 120 MG PO CP24
120.0000 mg | ORAL_CAPSULE | Freq: Every day | ORAL | Status: DC
  Administered 2011-05-04 – 2011-05-05 (×2): 120 mg via ORAL
  Filled 2011-05-04 (×3): qty 1

## 2011-05-04 MED FILL — Aspirin Chew Tab 81 MG: ORAL | Qty: 4 | Status: AC

## 2011-05-04 NOTE — Progress Notes (Signed)
   CARE MANAGEMENT NOTE 05/04/2011  Patient:  [name], [name]   Account Number:  [id number]  Date Initiated:  05/04/2011  Documentation initiated by:  GRAVES-BIGELOW,Linzy Darling  Subjective/Objective Assessment:   Pt admitted with Left shoulder pain and A.fib w/ RVR.Xarelto benefits check in process. Pt usually gets medications filled at [facility] on [address]. The system is down and CM is not able to get a cost for xarelto.     Action/Plan:   Anticipated DC Date:  05/05/2011   Anticipated DC Plan:  HOME/SELF CARE      DC Planning Services  CM consult      Choice offered to / List presented to:             Status of service:  Completed, signed off Medicare Important Message given?   (If response is "NO", the following Medicare IM given date fields will be blank) Date Medicare IM given:   Date Additional Medicare IM given:    Discharge Disposition:  HOME/SELF CARE  Per UR Regulation:    If discussed at Long Length of Stay Meetings, dates discussed:    Comments:  05-04-11 1524 Tomi Bamberger, RN,BSN [phone] CM will continue to monitor for co pay cost in am. Pt states she usually pays 1.50 to 3.00 for medications.

## 2011-05-04 NOTE — Progress Notes (Signed)
UR Completed. Simmons, Rishikesh Khachatryan F [phone]  

## 2011-05-04 NOTE — Progress Notes (Signed)
Subjective:  Patient is still having mild left shoulder pain which is worse when she moves.  No chest pain.  NO dyspnea.  Objective:  Vital Signs in the last 24 hours: Temp:  [97.9 F (36.6 C)-98.4 F (36.9 C)] 97.9 F (36.6 C) (03/28 0359) Pulse Rate:  [70-113] 70  (03/28 0359) Resp:  [16-18] 18  (03/28 0359) BP: (109-131)/(70-92) 110/70 mmHg (03/28 0359) SpO2:  [95 %-100 %] 95 % (03/28 0359) FiO2 (%):  [2 %] 2 % (03/27 1746) Weight:  [68.04 kg (150 lb)] 68.04 kg (150 lb) (03/27 1502)  Intake/Output from previous day: 03/27 0701 - 03/28 0700 In: 365.1 [P.O.:120; I.V.:245.1] Out: -  Intake/Output from this shift:       . aspirin      . diltiazem (CARDIZEM) infusion  5-15 mg/hr Intravenous Once  . diltiazem      . diltiazem  10 mg Intravenous Once  . diltiazem  120 mg Oral Daily  . insulin aspart  0-15 Units Subcutaneous TID WC  . metoprolol  50 mg Oral BID  . pregabalin  150 mg Oral QHS  . rivaroxaban  20 mg Oral QAC supper  . sodium chloride  500 mL Intravenous Once  . sodium chloride  3 mL Intravenous Q12H  . DISCONTD: aspirin  324 mg Oral Once  . DISCONTD: aspirin  324 mg Oral Once      Physical Exam: The patient appears to be in no distress.  Head and neck exam reveals that the pupils are equal and reactive.  The extraocular movements are full.  There is no scleral icterus.  Mouth and pharynx are benign.  No lymphadenopathy.  No carotid bruits.  The jugular venous pressure is normal.  Thyroid is not enlarged or tender.  Chest is clear to percussion and auscultation.  No rales or rhonchi.  Expansion of the chest is symmetrical.  Heart reveals no abnormal lift or heave.  First and second heart sounds are normal.  There is no  gallop rub or click. No significant murmur heard this am  The abdomen is soft and nontender.  Bowel sounds are normoactive.  There is no hepatosplenomegaly or mass.  There are no abdominal bruits.  Extremities reveal no phlebitis or  edema.  Pedal pulses are good.  There is no cyanosis or clubbing.  Neurologic exam is normal strength and no lateralizing weakness.  No sensory deficits.  Integument reveals no rash  Lab Results:  Basename 05/04/11 0635 05/03/11 1505  WBC 6.2 6.8  HGB 11.9* 12.9  PLT 290 331    Basename 05/04/11 0635 05/03/11 1505  NA 138 138  K 4.2 3.7  CL 106 102  CO2 22 26  GLUCOSE 98 102*  BUN 10 13  CREATININE 0.77 1.00    Basename 05/03/11 1505  TROPONINI <0.30   Hepatic Function Panel  Basename 05/04/11 0635  PROT 6.7  ALBUMIN 3.3*  AST 17  ALT 8  ALKPHOS 60  BILITOT 0.3  BILIDIR --  IBILI --    Basename 05/04/11 0635  CHOL 186   No results found for this basename: PROTIME in the last 72 hours  Imaging: Dg Chest Portable 1 View  05/03/2011  *RADIOLOGY REPORT*  Clinical Data: Chest pain.  Shortness of breath.  Dizziness. History diabetes, hypertension, and atrial fibrillation.  PORTABLE CHEST - 1 VIEW 05/03/2011 1530 hours:  Comparison: Portable chest x-ray 04/01/2009 and two-view chest x- ray 07/21/2007 [facility].  Findings: Cardiac silhouette enlarged but  stable, allowing for differences in technique.  Thoracic aorta tortuous atherosclerotic, unchanged.  Hilar and mediastinal contours otherwise unremarkable. Pulmonary venous hypertension with perhaps minimal interstitial pulmonary edema.  No confluent airspace consolidation.  Prior left mastectomy and axillary node dissection.  IMPRESSION: Minimal/incipient CHF, with stable cardiomegaly and perhaps minimal interstitial pulmonary edema.  Original Report Authenticated By: Arnell Sieving, M.D.    Cardiac Studies: 2D echo pending.  EKG shows low voltage, no ischemic changes. Assessment/Plan:  Patient Active Hospital Problem List:  1) Atrial fib not known whether paroxysmal or established.  Not on anticoagulation at home.         Plan: Rate control, anticoagulation (with Xarelto if patient can afford)  2)  Left shoulder pain probably musculoskeletal         Plan: shoulder xray today  Plan: Probably home in am   LOS: 1 day    Cassell Clement 05/04/2011, 7:54 AM

## 2011-05-04 NOTE — Progress Notes (Signed)
  Echocardiogram 2D Echocardiogram has been performed.  [name] A 05/04/2011, 3:35 PM

## 2011-05-05 ENCOUNTER — Other Ambulatory Visit: Payer: Self-pay

## 2011-05-05 DIAGNOSIS — I4891 Unspecified atrial fibrillation: Principal | ICD-10-CM | POA: Diagnosis present

## 2011-05-05 DIAGNOSIS — M25512 Pain in left shoulder: Secondary | ICD-10-CM | POA: Diagnosis present

## 2011-05-05 DIAGNOSIS — I10 Essential (primary) hypertension: Secondary | ICD-10-CM | POA: Diagnosis present

## 2011-05-05 LAB — GLUCOSE, CAPILLARY: Glucose-Capillary: 106 mg/dL — ABNORMAL HIGH (ref 70–99)

## 2011-05-05 MED ORDER — DILTIAZEM HCL ER COATED BEADS 120 MG PO CP24: 120.0000 mg | ORAL_CAPSULE | Freq: Every day | ORAL | Status: DC

## 2011-05-05 MED ORDER — RIVAROXABAN 20 MG PO TABS: 20.0000 mg | ORAL_TABLET | Freq: Every day | ORAL | Status: DC

## 2011-05-05 NOTE — Progress Notes (Signed)
05-05-11 0840 CM did Call [facility] on [address] and they have xarelto 20 mg po available at cost of 3.50. Gala Lewandowsky, RN,BSN [phone].

## 2011-05-05 NOTE — Discharge Summary (Signed)
CARDIOLOGY DISCHARGE SUMMARY   Patient ID: [name] MRN: [id number] DOB/AGE: 07-14-1933 78 y.o.  Admit date: 05/03/2011 Discharge date: 05/05/2011  Primary Discharge Diagnosis:  PAF Secondary Discharge Diagnosis:  Patient Active Problem List  Diagnoses  . Atrial fibrillation  . Left shoulder pain  . Hypertension   Hospital Course: [name] is a 78 year-old female with a history of PAF. She went to [facility] with c/o of left shoulder pain and was found to be in A. Fib w/ RVR. She was transferred to [facility] for further evaluation and treatment.  Her shoulder was x-rayed and found to have no acute process. She was started on IV Cardizem and spontaneously converted to SR. The Cardizem was then changed to PO. Her Norvasc was discontinued. She was started on Xarelto for anticoagulation and case management checked to make sure her insurance would cover it and her pharmacy carried it.   On 05/05/2011, she was evaluated by Dr Daleen Squibb. She was ambulating without chest pain or SOB and is considered stable for discharge, to follow up as an outpatient.  Labs:   Lab Results  Component Value Date   WBC 6.2 05/04/2011   HGB 11.9* 05/04/2011   HCT 36.2 05/04/2011   MCV 92.8 05/04/2011   PLT 290 05/04/2011    Lab 05/04/11 0635  NA 138  K 4.2  CL 106  CO2 22  BUN 10  CREATININE 0.77  CALCIUM 9.2  PROT 6.7  BILITOT 0.3  ALKPHOS 60  ALT 8  AST 17  GLUCOSE 98    Basename 05/03/11 1505  CKTOTAL --  CKMB --  CKMBINDEX --  TROPONINI <0.30   Lipid Panel     Component Value Date/Time   CHOL 186 05/04/2011 0635   TRIG 157* 05/04/2011 0635   HDL 53 05/04/2011 0635   CHOLHDL 3.5 05/04/2011 0635   VLDL 31 05/04/2011 0635   LDLCALC 102* 05/04/2011 0635    Pro B Natriuretic peptide (BNP)  Date/Time Value Range Status  05/04/2011  6:35 AM 1043.0* 0-450 (pg/mL) Final    Basename 05/03/11 1505  INR 1.04       Radiology: Dg Chest Portable 1 View  05/03/2011   *RADIOLOGY REPORT*  Clinical Data: Chest pain.  Shortness of breath.  Dizziness. History diabetes, hypertension, and atrial fibrillation.  PORTABLE CHEST - 1 VIEW 05/03/2011 1530 hours:  Comparison: Portable chest x-ray 04/01/2009 and two-view chest x- ray 07/21/2007 [facility].  Findings: Cardiac silhouette enlarged but stable, allowing for differences in technique.  Thoracic aorta tortuous atherosclerotic, unchanged.  Hilar and mediastinal contours otherwise unremarkable. Pulmonary venous hypertension with perhaps minimal interstitial pulmonary edema.  No confluent airspace consolidation.  Prior left mastectomy and axillary node dissection.  IMPRESSION: Minimal/incipient CHF, with stable cardiomegaly and perhaps minimal interstitial pulmonary edema.  Original Report Authenticated By: Arnell Sieving, M.D.   Dg Shoulder Left  05/04/2011  *RADIOLOGY REPORT*  Clinical Data: Left shoulder pain.  Bursitis.  LEFT SHOULDER - 2+ VIEW  Comparison: None.  Findings: Left shoulder is located.  Internal and external rotation views appear normal.  Visualized left chest appears within normal limits.  Left axillary dissection clips are present.  There is no fracture.  Mild to moderate AC joint osteoarthritis.  Type 2 acromion.  IMPRESSION: No acute osseous abnormality.  AC joint osteoarthritis.  Original Report Authenticated By: Andreas Newport, M.D.    EKG: 04-May-2011 07:56:12  Atrial fibrillation Low voltage QRS Nonspecific T wave abnormality ,  probably digitalis effect Vent. rate 71 BPM PR interval * Cassandra QRS duration 78 Cassandra QT/QTc 406/441 Cassandra P-R-T axes * 44 68  Echo: 05/04/2011 Study Conclusions - Left ventricle: Poor image quality. Abnormal septal motion ? mild diffuse hypokinesis Consider MRI for more accurate EF The cavity size was normal. Ronnel Zuercher thickness was normal. Systolic function was mildly reduced. The estimated ejection fraction was in the range of 45% to 50%. Diffuse hypokinesis. -  Mitral valve: Calcified annulus. Mildly thickened leaflets . Mild regurgitation. - Atrial septum: No defect or patent foramen ovale was identified. - Pericardium, extracardiac: A trivial pericardial effusion was identified posterior to the heart.    FOLLOW UP PLANS AND APPOINTMENTS Discharge Orders    Future Orders Please Complete By Expires   Diet Carb Modified      Increase activity slowly        No Known Allergies Medication List  As of 05/05/2011  5:04 PM   STOP taking these medications         amLODipine 5 MG tablet      dronedarone 400 MG tablet         TAKE these medications         diltiazem 120 MG 24 hr capsule   Commonly known as: CARDIZEM CD   Take 1 capsule (120 mg total) by mouth daily.      metFORMIN 500 MG tablet   Commonly known as: GLUCOPHAGE   Take 500 mg by mouth 2 (two) times daily with a meal.      metoprolol 50 MG tablet   Commonly known as: LOPRESSOR   Take 50 mg by mouth 2 (two) times daily.      pregabalin 75 MG capsule   Commonly known as: LYRICA   Take 150 mg by mouth at bedtime.      Rivaroxaban 20 MG Tabs   Take 20 mg by mouth daily before supper.        [name] will call you with a follow-up appt.     BRING ALL MEDICATIONS WITH YOU TO FOLLOW UP APPOINTMENTS  Time spent with patient to include physician time: 35 min Signed: Theodore Demark 05/05/2011, 5:04 PM Co-Sign MD  Jesse Sans. Daleen Squibb, MD, [facility] Pager:  [phone]

## 2011-05-05 NOTE — Progress Notes (Signed)
Patient ambulated the 'circle' and down to the end of the long hall with the NT without any difficulty earlier this morning.  Baird Lyons 2:16 PM

## 2011-05-05 NOTE — Discharge Instructions (Signed)
[facility] will call with a follow-up appointment. [phone] [address] [place], [place] [address]

## 2011-05-05 NOTE — Progress Notes (Signed)
Patient Name: [name] Date of Encounter: 05/05/2011  Active Problems:  Atrial fibrillation  Left shoulder pain  Hypertension   SUBJECTIVE: Feels well. Denies chest pain or SOB. No palps. Has not been OOB much or amb in halls.   OBJECTIVE  Filed Vitals:   05/03/11 1935 05/04/11 0359 05/04/11 1426 05/04/11 2049  BP: 131/80 110/70 112/73 105/65  Pulse: 77 70 62 63  Temp: 98.1 F (36.7 C) 97.9 F (36.6 C) 97.8 F (36.6 C) 98.7 F (37.1 C)  TempSrc: Oral Oral Oral Oral  Resp: 16 18 17 18   Height:      Weight:      SpO2: 97% 95% 96% 94%    Intake/Output Summary (Last 24 hours) at 05/05/11 0640 Last data filed at 05/04/11 1800  Gross per 24 hour  Intake    720 ml  Output    400 ml  Net    320 ml   Weight change:  Wt Readings from Last 3 Encounters:  05/03/11 150 lb (68.04 kg)     PHYSICAL EXAM  General: Well developed, well nourished, AA female in no acute distress. Head: Normocephalic, atraumatic.  Neck: Supple without bruits, JVD slightly elevated. Lungs:  Resp regular and unlabored, decreased breath sounds in bases with no crackles noted. Heart: Irregular R&R, S1, S2, no S3, S4, or murmurs. Abdomen: Soft, non-tender, non-distended, BS + x 4.  Extremities: No clubbing, cyanosis, no edema.  Neuro: Alert and oriented X 3. Moves all extremities spontaneously. Psych: Normal affect.  LABS:  CBC: Basename 05/04/11 0635 05/03/11 1505  WBC 6.2 6.8  NEUTROABS -- 3.2  HGB 11.9* 12.9  HCT 36.2 38.3  MCV 92.8 90.3  PLT 290 331   INR: Basename 05/03/11 1505  INR 1.04   Basic Metabolic Panel: Basename 05/04/11 0635 05/03/11 1505  NA 138 138  K 4.2 3.7  CL 106 102  CO2 22 26  GLUCOSE 98 102*  BUN 10 13  CREATININE 0.77 1.00  CALCIUM 9.2 9.7  MG -- --  PHOS -- --   Liver Function Tests: [employer] 05/04/11 0635  AST 17  ALT 8  ALKPHOS 60  BILITOT 0.3  PROT 6.7  ALBUMIN 3.3*   Cardiac Enzymes: Basename 05/03/11 1505  CKTOTAL --    CKMB --  CKMBINDEX --  TROPONINI <0.30   BNP: Pro B Natriuretic peptide (BNP)  Date/Time Value Range Status  05/04/2011  6:35 AM 1043.0* 0-450 (pg/mL) Final   Hemoglobin A1C: Basename 05/03/11 2205  HGBA1C 6.6*   Fasting Lipid Panel: Basename 05/04/11 0635  CHOL 186  HDL 53  LDLCALC 102*  TRIG 157*  CHOLHDL 3.5  LDLDIRECT --   Thyroid Function Tests: Basename 05/03/11 2205  TSH 1.007  T4TOTAL --  T3FREE --  THYROIDAB --    TELE:  Atrial fib, rates 40s at times, especially while asleep but no pauses > 3 sec and no HR sustained < 40.   Echo: 05/04/2011 Study Conclusions - Left ventricle: Poor image quality. Abnormal septal motion ? mild diffuse hypokinesis Consider MRI for more accurate EF The cavity size was normal. [name] Dec thickness was normal. Systolic function was mildly reduced. The estimated ejection fraction was in the range of 45% to 50%. Diffuse hypokinesis. - Mitral valve: Calcified annulus. Mildly thickened leaflets . Mild regurgitation. - Atrial septum: No defect or patent foramen ovale was identified. - Pericardium, extracardiac: A trivial pericardial effusion was identified posterior to the heart.   ECG: 05/04/2011 04-May-2011 07:56:12  Atrial fibrillation Low voltage QRS Nonspecific T wave abnormality , probably digitalis effect Abnormal ECG Vent. rate 71 BPM PR interval * ms QRS duration 78 ms QT/QTc 406/441 ms P-R-T axes * 44 68   Radiology/Studies: Dg Chest Portable 1 View 05/03/2011  *RADIOLOGY REPORT*  Clinical Data: Chest pain.  Shortness of breath.  Dizziness. History diabetes, hypertension, and atrial fibrillation.  PORTABLE CHEST - 1 VIEW 05/03/2011 1530 hours:  Comparison: Portable chest x-ray 04/01/2009 and two-view chest x- ray 07/21/2007 [facility].  Findings: Cardiac silhouette enlarged but stable, allowing for differences in technique.  Thoracic aorta tortuous atherosclerotic, unchanged.  Hilar and mediastinal contours  otherwise unremarkable. Pulmonary venous hypertension with perhaps minimal interstitial pulmonary edema.  No confluent airspace consolidation.  Prior left mastectomy and axillary node dissection.  IMPRESSION: Minimal/incipient CHF, with stable cardiomegaly and perhaps minimal interstitial pulmonary edema.  Original Report Authenticated By: Arnell Sieving, M.D.   Dg Shoulder Left 05/04/2011  *RADIOLOGY REPORT*  Clinical Data: Left shoulder pain.  Bursitis.  LEFT SHOULDER - 2+ VIEW  Comparison: None.  Findings: Left shoulder is located.  Internal and external rotation views appear normal.  Visualized left chest appears within normal limits.  Left axillary dissection clips are present.  There is no fracture.  Mild to moderate AC joint osteoarthritis.  Type 2 acromion.  IMPRESSION: No acute osseous abnormality.  AC joint osteoarthritis.  Original Report Authenticated By: Andreas Newport, M.D.    Current Medications:     . diltiazem  120 mg Oral Daily  . insulin aspart  0-15 Units Subcutaneous TID WC  . metoprolol  50 mg Oral BID  . pregabalin  150 mg Oral QHS  . rivaroxaban  20 mg Oral QAC supper  . sodium chloride  3 mL Intravenous Q12H  . DISCONTD: diltiazem  120 mg Oral Daily    ASSESSMENT AND PLAN: Active Problems:  Atrial fibrillation - Good rate control on Lopressor 50 bid, dronedarone d/c'd    Left shoulder pain -  OA on X ray, f/u with primary MD +/- ortho   Hypertension - Good control on Lopressor, off Norvasc  Anticoag - Xarelto  Started this admit. Case Mgr to assess cost to pt.   Plan: ambulate today, d/c if pt does well with ambulation and Xarelto can be obtained as OP.       SignedTheodore Demark , PA-C 6:40 AM 05/05/2011 Patient examined and agree. Home this afternoon.  Valera Castle, MD 05/05/2011 10:22 AM

## 2011-05-19 ENCOUNTER — Encounter: Payer: Medicare Other | Admitting: Nurse Practitioner

## 2011-09-18 DIAGNOSIS — K219 Gastro-esophageal reflux disease without esophagitis: Secondary | ICD-10-CM | POA: Insufficient documentation

## 2012-06-18 DIAGNOSIS — J4489 Other specified chronic obstructive pulmonary disease: Secondary | ICD-10-CM | POA: Insufficient documentation

## 2012-07-31 ENCOUNTER — Encounter (HOSPITAL_BASED_OUTPATIENT_CLINIC_OR_DEPARTMENT_OTHER): Payer: Self-pay | Admitting: Family Medicine

## 2012-07-31 ENCOUNTER — Emergency Department (HOSPITAL_BASED_OUTPATIENT_CLINIC_OR_DEPARTMENT_OTHER): Payer: Medicare Other

## 2012-07-31 ENCOUNTER — Emergency Department (HOSPITAL_BASED_OUTPATIENT_CLINIC_OR_DEPARTMENT_OTHER)
Admission: EM | Admit: 2012-07-31 | Discharge: 2012-07-31 | Disposition: A | Discharge status: Home / Self Care | Payer: Medicare Other | Attending: Emergency Medicine | Admitting: Emergency Medicine

## 2012-07-31 DIAGNOSIS — M25512 Pain in left shoulder: Secondary | ICD-10-CM

## 2012-07-31 DIAGNOSIS — Z853 Personal history of malignant neoplasm of breast: Secondary | ICD-10-CM | POA: Insufficient documentation

## 2012-07-31 DIAGNOSIS — R002 Palpitations: Secondary | ICD-10-CM | POA: Insufficient documentation

## 2012-07-31 DIAGNOSIS — E119 Type 2 diabetes mellitus without complications: Secondary | ICD-10-CM | POA: Insufficient documentation

## 2012-07-31 DIAGNOSIS — I10 Essential (primary) hypertension: Secondary | ICD-10-CM | POA: Insufficient documentation

## 2012-07-31 DIAGNOSIS — M25519 Pain in unspecified shoulder: Secondary | ICD-10-CM | POA: Insufficient documentation

## 2012-07-31 DIAGNOSIS — Z79899 Other long term (current) drug therapy: Secondary | ICD-10-CM | POA: Insufficient documentation

## 2012-07-31 DIAGNOSIS — Z862 Personal history of diseases of the blood and blood-forming organs and certain disorders involving the immune mechanism: Secondary | ICD-10-CM | POA: Insufficient documentation

## 2012-07-31 DIAGNOSIS — I4891 Unspecified atrial fibrillation: Secondary | ICD-10-CM | POA: Insufficient documentation

## 2012-07-31 DIAGNOSIS — Z8639 Personal history of other endocrine, nutritional and metabolic disease: Secondary | ICD-10-CM | POA: Insufficient documentation

## 2012-07-31 DIAGNOSIS — Z8719 Personal history of other diseases of the digestive system: Secondary | ICD-10-CM | POA: Insufficient documentation

## 2012-07-31 LAB — BASIC METABOLIC PANEL
BUN: 14 mg/dL (ref 6–23)
Chloride: 102 mEq/L (ref 96–112)
Creatinine, Ser: 0.9 mg/dL (ref 0.50–1.10)
GFR calc non Af Amer: 61 mL/min — ABNORMAL LOW (ref >90)
Glucose, Bld: 112 mg/dL — ABNORMAL HIGH (ref 70–99)
Potassium: 3.7 mEq/L (ref 3.5–5.1)

## 2012-07-31 LAB — CBC WITH DIFFERENTIAL/PLATELET
Eosinophils Absolute: 0.2 10*3/uL (ref 0.0–0.7)
HCT: 36.2 % (ref 36.0–46.0)
Hemoglobin: 12 g/dL (ref 12.0–15.0)
Lymphs Abs: 2.7 10*3/uL (ref 0.7–4.0)
MCH: 30.1 pg (ref 26.0–34.0)
MCHC: 33.1 g/dL (ref 30.0–36.0)
MCV: 90.7 fL (ref 78.0–100.0)
Monocytes Absolute: 0.8 10*3/uL (ref 0.1–1.0)
Monocytes Relative: 8 % (ref 3–12)
Neutrophils Relative %: 65 % (ref 43–77)
RBC: 3.99 MIL/uL (ref 3.87–5.11)

## 2012-07-31 NOTE — ED Notes (Signed)
Patient transported to X-ray 

## 2012-07-31 NOTE — ED Notes (Signed)
Pt denies chest pain, denies neck and shoulder pain at present.

## 2012-07-31 NOTE — ED Notes (Signed)
Pt c/o heart racing x 2-3 days and pain to left neck and shoulder x 2-3 weeks. Pt sts she had echo with [place] on 5/26 at [facility].

## 2012-07-31 NOTE — ED Provider Notes (Signed)
History    CSN: [id number] Arrival date & time 07/31/12  4098  First MD Initiated Contact with Patient 07/31/12 [phone]     Chief Complaint  Patient presents with  . Palpitations   (Consider location/radiation/quality/duration/timing/severity/associated sxs/prior Treatment) Patient is a 79 y.o. female presenting with palpitations.  Palpitations  Pt with history PAF currently on Cardizem and Xarelto and followed by cardiology at [facility] reports intermittent heart racing for the last several days. Feels like her heart is 'going to run away' Some chest fullness and SOB during those episodes. She is symptom free now. She has appt with Cards tomorrow but was afraid to wait that long. She also had chronic L shoulder pain from pinched nerve and rotator cuff syndrome. No change there, no pain now.   Past Medical History  Diagnosis Date  . Diabetes mellitus   . Hypertension   . Atrial fibrillation     Diagnosed ~2009  . GERD (gastroesophageal reflux disease)   . Breast cancer, left breast     S/p chemo and mastectomy 1998  . Hyperlipidemia   . Left shoulder pain     "MRI showed pinched nerve" - per pt   Past Surgical History  Procedure Laterality Date  . Cholecystectomy    . Knee surgery      left  . Mastectomy      left, 1998   Family History  Problem Relation Age of Onset  . Heart attack Mother     85s   History  Substance Use Topics  . Smoking status: Never Smoker   . Smokeless tobacco: Current User    Types: Chew  . Alcohol Use: No   OB History   Grav Para Term Preterm Abortions TAB SAB Ect Mult Living                 Review of Systems  Cardiovascular: Positive for palpitations.   All other systems reviewed and are negative except as noted in HPI.   Allergies  Review of patient's allergies indicates no known allergies.  Home Medications   Current Outpatient Rx  Name  Route  Sig  Dispense  Refill  . EXPIRED: diltiazem (CARDIZEM CD) 120 MG 24 hr  capsule   Oral   Take 1 capsule (120 mg total) by mouth daily.   30 capsule   11   . metFORMIN (GLUCOPHAGE) 500 MG tablet   Oral   Take 500 mg by mouth 2 (two) times daily with a meal.           . metoprolol (LOPRESSOR) 50 MG tablet   Oral   Take 50 mg by mouth 2 (two) times daily.           . pregabalin (LYRICA) 75 MG capsule   Oral   Take 150 mg by mouth at bedtime.          . rivaroxaban 20 MG TABS   Oral   Take 20 mg by mouth daily before supper.   30 tablet   11    BP 172/104  Pulse 98  Temp(Src) 98.1 F (36.7 C) (Oral)  Resp 20  SpO2 98% Physical Exam  Nursing note and vitals reviewed. Constitutional: She is oriented to person, place, and time. She appears well-developed and well-nourished.  HENT:  Head: Normocephalic and atraumatic.  Eyes: EOM are normal. Pupils are equal, round, and reactive to light.  Neck: Normal range of motion. Neck supple.  Cardiovascular: Normal rate, normal heart sounds and  intact distal pulses.   Pulmonary/Chest: Effort normal and breath sounds normal.  Abdominal: Bowel sounds are normal. She exhibits no distension. There is no tenderness.  Musculoskeletal: Normal range of motion. She exhibits no edema and no tenderness.  Neurological: She is alert and oriented to person, place, and time. She has normal strength. No cranial nerve deficit or sensory deficit.  Skin: Skin is warm and dry. No rash noted.  Psychiatric: She has a normal mood and affect.    ED Course  Procedures (including critical care time) Labs Reviewed  CBC WITH DIFFERENTIAL - Abnormal; Notable for the following:    WBC 10.7 (*)    All other components within normal limits  BASIC METABOLIC PANEL - Abnormal; Notable for the following:    Glucose, Bld 112 (*)    GFR calc non Af Amer 61 (*)    GFR calc Af Amer 70 (*)    All other components within normal limits  TROPONIN I   Dg Chest 2 View  07/31/2012   *RADIOLOGY REPORT*  Clinical Data: Shortness of  breath with palpitations.  History of diabetes and hypertension.  CHEST - 2 VIEW  Comparison: 05/03/2011 and 04/01/2009.  Findings: There is stable cardiomegaly.  Mediastinal contours are stable.  The lungs are clear.  There is no pleural effusion or pneumothorax.  Postsurgical changes are noted status post left mastectomy and axillary node dissection.  The osseous structures appear unchanged.  IMPRESSION: No acute cardiopulmonary process.  Stable cardiomegaly.   Original Report Authenticated By: Carey Bullocks, M.D.   1. Atrial fibrillation   2. Left shoulder pain   3. Palpitations     MDM   Date: 07/31/2012  Rate: 100  Rhythm: atrial fibrillation and premature ventricular contractions (PVC)  QRS Axis: normal  Intervals: normal  ST/T Wave abnormalities: normal  Conduction Disutrbances:none  Narrative Interpretation:   Old EKG Reviewed: faster rate today  Pt remains asymptomatic in the ED. Labs and imaging unremarkable. She has follow up already scheduled for tomorrow. I suspect she is having episodes of RVR due to afib, but none in the ED today. Advised to return for worsening.    Charles B. Bernette Mayers, MD 07/31/12 1021

## 2012-11-16 ENCOUNTER — Encounter (HOSPITAL_BASED_OUTPATIENT_CLINIC_OR_DEPARTMENT_OTHER): Payer: Self-pay | Admitting: Emergency Medicine

## 2012-11-16 ENCOUNTER — Emergency Department (HOSPITAL_BASED_OUTPATIENT_CLINIC_OR_DEPARTMENT_OTHER)
Admission: EM | Admit: 2012-11-16 | Discharge: 2012-11-16 | Disposition: A | Discharge status: Home / Self Care | Payer: Medicare Other | Attending: Emergency Medicine | Admitting: Emergency Medicine

## 2012-11-16 ENCOUNTER — Emergency Department (HOSPITAL_BASED_OUTPATIENT_CLINIC_OR_DEPARTMENT_OTHER): Payer: Medicare Other

## 2012-11-16 DIAGNOSIS — Z79899 Other long term (current) drug therapy: Secondary | ICD-10-CM | POA: Insufficient documentation

## 2012-11-16 DIAGNOSIS — Z9221 Personal history of antineoplastic chemotherapy: Secondary | ICD-10-CM | POA: Insufficient documentation

## 2012-11-16 DIAGNOSIS — I10 Essential (primary) hypertension: Secondary | ICD-10-CM | POA: Insufficient documentation

## 2012-11-16 DIAGNOSIS — Z853 Personal history of malignant neoplasm of breast: Secondary | ICD-10-CM | POA: Insufficient documentation

## 2012-11-16 DIAGNOSIS — M25561 Pain in right knee: Secondary | ICD-10-CM

## 2012-11-16 DIAGNOSIS — M25469 Effusion, unspecified knee: Secondary | ICD-10-CM | POA: Insufficient documentation

## 2012-11-16 DIAGNOSIS — Z9889 Other specified postprocedural states: Secondary | ICD-10-CM | POA: Insufficient documentation

## 2012-11-16 DIAGNOSIS — E119 Type 2 diabetes mellitus without complications: Secondary | ICD-10-CM | POA: Insufficient documentation

## 2012-11-16 DIAGNOSIS — IMO0001 Reserved for inherently not codable concepts without codable children: Secondary | ICD-10-CM | POA: Insufficient documentation

## 2012-11-16 DIAGNOSIS — Z8719 Personal history of other diseases of the digestive system: Secondary | ICD-10-CM | POA: Insufficient documentation

## 2012-11-16 DIAGNOSIS — M25569 Pain in unspecified knee: Secondary | ICD-10-CM | POA: Insufficient documentation

## 2012-11-16 DIAGNOSIS — I4891 Unspecified atrial fibrillation: Secondary | ICD-10-CM | POA: Insufficient documentation

## 2012-11-16 MED ORDER — HYDROCODONE-ACETAMINOPHEN 5-325 MG PO TABS
2.0000 | ORAL_TABLET | Freq: Once | ORAL | Status: AC
  Administered 2012-11-16: 2 via ORAL
  Filled 2012-11-16: qty 2

## 2012-11-16 MED ORDER — HYDROCODONE-ACETAMINOPHEN 5-325 MG PO TABS: 2.0000 | ORAL_TABLET | ORAL | Status: DC | PRN

## 2012-11-16 NOTE — ED Notes (Signed)
Patient here with increasing left knee pain, denies injury, swelling with fluid noted to same, hx of same

## 2012-11-16 NOTE — ED Provider Notes (Signed)
Medical screening examination/treatment/procedure(s) were performed by non-physician practitioner and as supervising physician I was immediately available for consultation/collaboration.   Charles B. Bernette Mayers, MD 11/16/12 1459

## 2012-11-16 NOTE — ED Provider Notes (Addendum)
CSN: [id number]     Arrival date & time 11/16/12  1316 History   First MD Initiated Contact with Patient 11/16/12 1415     Chief Complaint  Patient presents with  . Knee Pain   (Consider location/radiation/quality/duration/timing/severity/associated sxs/prior Treatment) Patient is a 79 y.o. female presenting with knee pain. The history is provided by the patient. No language interpreter was used.  Knee Pain Location:  Knee Injury: no   Knee location:  L knee Pain details:    Quality:  Aching   Radiates to:  Does not radiate   Severity:  No pain   Timing:  Constant   Progression:  Worsening Chronicity:  New Relieved by:  Nothing Worsened by:  Nothing tried Ineffective treatments:  None tried Associated symptoms: no back pain   Risk factors: no concern for non-accidental trauma   Pt complains of pain in her left knee.   Pt has had swelling for several months.    Past Medical History  Diagnosis Date  . Diabetes mellitus   . Hypertension   . Atrial fibrillation     Diagnosed ~2009  . GERD (gastroesophageal reflux disease)   . Breast cancer, left breast     S/p chemo and mastectomy 1998  . Hyperlipidemia   . Left shoulder pain     "MRI showed pinched nerve" - per pt   Past Surgical History  Procedure Laterality Date  . Cholecystectomy    . Knee surgery      left  . Mastectomy      left, 1998   Family History  Problem Relation Age of Onset  . Heart attack Mother     46s   History  Substance Use Topics  . Smoking status: Never Smoker   . Smokeless tobacco: Current User    Types: Chew  . Alcohol Use: No   OB History   Grav Para Term Preterm Abortions TAB SAB Ect Mult Living                 Review of Systems  Musculoskeletal: Positive for gait problem, joint swelling and myalgias. Negative for back pain.  All other systems reviewed and are negative.    Allergies  Review of patient's allergies indicates no known allergies.  Home Medications   Current  Outpatient Rx  Name  Route  Sig  Dispense  Refill  . EXPIRED: diltiazem (CARDIZEM CD) 120 MG 24 hr capsule   Oral   Take 1 capsule (120 mg total) by mouth daily.   30 capsule   11   . metFORMIN (GLUCOPHAGE) 500 MG tablet   Oral   Take 500 mg by mouth 2 (two) times daily with a meal.           . metoprolol (LOPRESSOR) 50 MG tablet   Oral   Take 50 mg by mouth 2 (two) times daily.           . pregabalin (LYRICA) 75 MG capsule   Oral   Take 150 mg by mouth at bedtime.          . rivaroxaban 20 MG TABS   Oral   Take 20 mg by mouth daily before supper.   30 tablet   11    BP 150/108  Pulse 94  Temp(Src) 98.4 F (36.9 C) (Oral)  Resp 20  Ht 5\' 4"  (1.626 m)  Wt 163 lb (73.936 kg)  BMI 27.97 kg/m2  SpO2 98% Physical Exam  Nursing note and vitals reviewed.  Constitutional: She appears well-developed and well-nourished.  HENT:  Head: Normocephalic.  Neck: Normal range of motion.  Cardiovascular: Normal rate.   Pulmonary/Chest: Effort normal.  Musculoskeletal: She exhibits tenderness.  Swollen left knee,  No erythema,  Normal temp   Neurological: She is alert.  Skin: Skin is warm.  Psychiatric: She has a normal mood and affect.    ED Course  Procedures (including critical care time) Labs Review Labs Reviewed - No data to display Imaging Review Dg Knee Complete 4 Views Left  11/16/2012   CLINICAL DATA:  Left knee pain.  EXAM: LEFT KNEE - COMPLETE 4+ VIEW  COMPARISON:  No priors.  FINDINGS: Four views of the left knee demonstrate postoperative changes of left total knee arthroplasty. Adjacent to the femoral prosthesis there is extensive lucency in the surrounding bone, which can be seen in setting of loosening or infection. Likewise, on the lateral projection there is lucency at the bone-cement interface in the posterior aspect of the proximal tibia. Large joint effusion. No acute displaced fractures are identified.  IMPRESSION: 1. Postoperative changes of left total  knee arthroplasty without evidence to suggest loosening or infection of the prosthesis, with very large joint effusion. Clinical correlation is strongly recommended.   Electronically Signed   By: Trudie Reed M.D.   On: 11/16/2012 14:15    EKG Interpretation   None       MDM  No diagnosis found. Pt counseled.   I will treat pain.   I advised pt to follow up with her Orthopaedist for recheck next week.    Elson Areas, PA-C 11/16/12 1456  Elson Areas, PA-C 11/16/12 [phone]

## 2012-11-18 ENCOUNTER — Telehealth (HOSPITAL_BASED_OUTPATIENT_CLINIC_OR_DEPARTMENT_OTHER): Payer: Self-pay | Admitting: *Deleted

## 2012-11-29 ENCOUNTER — Other Ambulatory Visit (HOSPITAL_COMMUNITY): Payer: Self-pay | Admitting: Orthopaedic Surgery

## 2012-11-29 DIAGNOSIS — M25562 Pain in left knee: Secondary | ICD-10-CM

## 2012-12-10 ENCOUNTER — Encounter (HOSPITAL_COMMUNITY)
Admission: RE | Admit: 2012-12-10 | Discharge: 2012-12-10 | Disposition: A | Discharge status: Home / Self Care | Payer: Medicare Other | Source: Ambulatory Visit | Attending: Orthopaedic Surgery | Admitting: Orthopaedic Surgery

## 2012-12-10 DIAGNOSIS — Z96659 Presence of unspecified artificial knee joint: Secondary | ICD-10-CM | POA: Insufficient documentation

## 2012-12-10 DIAGNOSIS — M7989 Other specified soft tissue disorders: Secondary | ICD-10-CM | POA: Insufficient documentation

## 2012-12-10 DIAGNOSIS — M25562 Pain in left knee: Secondary | ICD-10-CM

## 2012-12-10 DIAGNOSIS — R059 Cough, unspecified: Secondary | ICD-10-CM | POA: Insufficient documentation

## 2012-12-10 DIAGNOSIS — R05 Cough: Secondary | ICD-10-CM | POA: Insufficient documentation

## 2012-12-10 DIAGNOSIS — M25569 Pain in unspecified knee: Secondary | ICD-10-CM | POA: Insufficient documentation

## 2012-12-10 MED ORDER — TECHNETIUM TC 99M MEDRONATE IV KIT
25.0000 | PACK | Freq: Once | INTRAVENOUS | Status: AC | PRN
  Administered 2012-12-10: 25 via INTRAVENOUS

## 2013-01-15 ENCOUNTER — Other Ambulatory Visit (HOSPITAL_COMMUNITY): Payer: Self-pay | Admitting: Orthopaedic Surgery

## 2013-01-21 NOTE — Pre-Procedure Instructions (Signed)
[name]  01/21/2013   Your procedure is scheduled on:  Tuesday, December 23rd.  Report to [facility], Main Entrance/Entrance "A" at 11:45 AM.  Call this number if you have problems the morning of surgery: [phone]   Remember:   Do not eat food or drink liquids after midnight Monday.   Take these medicines the morning of surgery with A SIP OF WATER: Diltiazem, Metoprolol and pain medication if needed for pain   Do not wear jewelry, make-up or nail polish.  Do not wear lotions, powders, or perfumes. You may wear deodorant.  Do not shave 48 hours prior to surgery.   Do not bring valuables to the hospital.  [facility] is not responsible for any belongings or valuables.               Contacts, dentures or bridgework may not be worn into surgery.  Leave suitcase in the car. After surgery it may be brought to your room.  For patients admitted to the hospital, discharge time is determined by your  treatment team.                 Special Instructions: Shower using CHG 2 nights before surgery and the night before surgery.  If you shower the day of surgery use CHG.  Use special wash - you have one bottle of CHG for all showers.  You should use approximately 1/3 of the bottle for each shower.   Please read over the following fact sheets that you were given: Pain Booklet, Coughing and Deep Breathing, Blood Transfusion Information and Surgical Site Infection Prevention

## 2013-01-22 ENCOUNTER — Encounter (HOSPITAL_COMMUNITY)
Admission: RE | Admit: 2013-01-22 | Discharge: 2013-01-22 | Disposition: A | Discharge status: Home / Self Care | Payer: Medicare Other | Source: Ambulatory Visit | Attending: Orthopaedic Surgery | Admitting: Orthopaedic Surgery

## 2013-01-22 ENCOUNTER — Encounter (HOSPITAL_COMMUNITY): Payer: Self-pay

## 2013-01-22 DIAGNOSIS — Z01812 Encounter for preprocedural laboratory examination: Secondary | ICD-10-CM | POA: Insufficient documentation

## 2013-01-22 DIAGNOSIS — Z01818 Encounter for other preprocedural examination: Secondary | ICD-10-CM | POA: Insufficient documentation

## 2013-01-22 HISTORY — DX: Unspecified osteoarthritis, unspecified site: M19.90

## 2013-01-22 LAB — URINALYSIS, ROUTINE W REFLEX MICROSCOPIC
Glucose, UA: NEGATIVE mg/dL
Hgb urine dipstick: NEGATIVE
Ketones, ur: NEGATIVE mg/dL
Leukocytes, UA: NEGATIVE
Protein, ur: 100 mg/dL — AB
Urobilinogen, UA: 1 mg/dL (ref 0.0–1.0)
pH: 6 (ref 5.0–8.0)

## 2013-01-22 LAB — COMPREHENSIVE METABOLIC PANEL
ALT: 6 U/L (ref 0–35)
AST: 16 U/L (ref 0–37)
Calcium: 9.9 mg/dL (ref 8.4–10.5)
Creatinine, Ser: 0.87 mg/dL (ref 0.50–1.10)
GFR calc non Af Amer: 63 mL/min — ABNORMAL LOW (ref >90)
Sodium: 138 mEq/L (ref 135–145)
Total Bilirubin: 0.4 mg/dL (ref 0.3–1.2)
Total Protein: 8.4 g/dL — ABNORMAL HIGH (ref 6.0–8.3)

## 2013-01-22 LAB — URINE MICROSCOPIC-ADD ON

## 2013-01-22 LAB — CBC WITH DIFFERENTIAL/PLATELET
Basophils Absolute: 0 10*3/uL (ref 0.0–0.1)
Basophils Relative: 0 % (ref 0–1)
Eosinophils Absolute: 0.1 10*3/uL (ref 0.0–0.7)
Eosinophils Relative: 1 % (ref 0–5)
HCT: 39.6 % (ref 36.0–46.0)
Lymphocytes Relative: 23 % (ref 12–46)
MCH: 30.4 pg (ref 26.0–34.0)
MCHC: 33.3 g/dL (ref 30.0–36.0)
MCV: 91.2 fL (ref 78.0–100.0)
Monocytes Absolute: 0.6 10*3/uL (ref 0.1–1.0)
Neutrophils Relative %: 71 % (ref 43–77)
Platelets: 301 10*3/uL (ref 150–400)
RDW: 13.3 % (ref 11.5–15.5)
WBC: 10.7 10*3/uL — ABNORMAL HIGH (ref 4.0–10.5)

## 2013-01-22 LAB — C-REACTIVE PROTEIN: CRP: 0.5 mg/dL — ABNORMAL LOW (ref <0.60)

## 2013-01-22 LAB — SEDIMENTATION RATE: Sed Rate: 22 mm/hr (ref 0–22)

## 2013-01-22 LAB — TYPE AND SCREEN
ABO/RH(D): A POS
Antibody Screen: NEGATIVE

## 2013-01-22 LAB — SURGICAL PCR SCREEN: Staphylococcus aureus: NEGATIVE

## 2013-01-22 LAB — PROTIME-INR: INR: 1.13 (ref 0.00–1.49)

## 2013-01-22 MED ORDER — CHLORHEXIDINE GLUCONATE 4 % EX LIQD: 60.0000 mL | Freq: Once | CUTANEOUS | Status: DC

## 2013-01-23 LAB — URINE CULTURE

## 2013-01-23 NOTE — Progress Notes (Addendum)
Anesthesia chart review:  Patient is a 79 year old female scheduled for revision of left TKA on 01/28/13 by Dr. Roda Shutters.  History includes non-smoker, afib ~ '09, DM2, HTN, GERD, left breast cancer s/p left mastectomy and chemotherapy '98, arthritis, cholecystectomy, left TKA '01, bilateral cataract extraction. PCP is listed as Deneen Harts, FNP.  Last cardiology note in Epic is from Dr. Valera Castle in 04/2011 during a hospitalization for afib with RVR. However, she is now followed by Dr. Lollie Marrow at [facility]) in [facility].  Records are pending, but patient reports a recent stress and echo.  She denies chest pain, SOB.  She reports her Xarelto is already on hold.     EKG on 07/31/12 showed afib with PVC, septal infarct (age undetermined).  Her last echo in Epic is from 05/04/11.  Report can be viewed under Results Review tab.  She reports a more recent echo at [facility], however records are pending.  CXR on 07/31/12 showed no acute cardiopulmonary process.  Stable cardiomegaly.  Preoperative labs noted.  Urine culture showed insignificant growth.  I'll follow-up once additional records are received.  Velna Ochs [facility] Short Stay Center/Anesthesiology Phone [phone] 01/23/2013 1:40 PM  Addendum: 01/27/2013 9:45 AM I received records from [facility] this morning.  She saw Dr. Hanley Hays last on 12/26/12.  His note indicates that she is cleared for surgery.    She had a nuclear stress test on 12/26/12 that showed: Myocardial perfusion imaging is normal showing only mild mid anterior fixed breast attenuation artifact.  Overall, LV systolic function was normal without regional wall motion abnormalities.  LVEF 57%.  Echo on 12/26/12 showed: Overall LV systolic function is mildly impaired with EF between 41-49%. Mildly dilated LA,  Moderately enlarged RA, Trace MR.  Moderate TR. Mild to moderate pulmonary hypertension.  RVSP measured by Doppler is 44.35 mmHg.    If no  acute changes then anticipate that she can proceed as planned.  Of note, she reported that she did not want to go back on Xarelto after surgery because it made her feel "sick."  I did encourage her to talk further with Dr. Hanley Hays about this since her CVA risk was increased due to her chronic afib.  I do anticipate that she will be on some sort of DVT prophylaxis post-operatively per Dr. Roda Shutters.

## 2013-01-27 LAB — VITAMIN D 1,25 DIHYDROXY
Vitamin D 1, 25 (OH)2 Total: 57 pg/mL (ref 18–72)
Vitamin D2 1, 25 (OH)2: <8 pg/mL
Vitamin D3 1, 25 (OH)2: 57 pg/mL

## 2013-01-27 MED ORDER — SODIUM CHLORIDE 0.9 % IJ SOLN
Freq: Once | INTRAMUSCULAR | Status: DC
  Filled 2013-01-27: qty 20

## 2013-01-28 ENCOUNTER — Encounter (HOSPITAL_COMMUNITY): Payer: Medicare Other | Admitting: Vascular Surgery

## 2013-01-28 ENCOUNTER — Encounter (HOSPITAL_COMMUNITY)
Admission: RE | Disposition: A | Discharge status: Home / Self Care | Payer: Self-pay | Source: Ambulatory Visit | Attending: Orthopaedic Surgery

## 2013-01-28 ENCOUNTER — Inpatient Hospital Stay (HOSPITAL_COMMUNITY): Payer: Medicare Other | Admitting: Anesthesiology

## 2013-01-28 ENCOUNTER — Encounter (HOSPITAL_COMMUNITY): Payer: Self-pay | Admitting: *Deleted

## 2013-01-28 ENCOUNTER — Inpatient Hospital Stay (HOSPITAL_COMMUNITY)
Admission: RE | Admit: 2013-01-28 | Discharge: 2013-01-31 | DRG: 468 | Disposition: A | Discharge status: Home / Self Care | Payer: Medicare Other | Source: Ambulatory Visit | Attending: Orthopaedic Surgery | Admitting: Orthopaedic Surgery

## 2013-01-28 ENCOUNTER — Inpatient Hospital Stay (HOSPITAL_COMMUNITY): Payer: Medicare Other

## 2013-01-28 DIAGNOSIS — E119 Type 2 diabetes mellitus without complications: Secondary | ICD-10-CM | POA: Diagnosis present

## 2013-01-28 DIAGNOSIS — Y831 Surgical operation with implant of artificial internal device as the cause of abnormal reaction of the patient, or of later complication, without mention of misadventure at the time of the procedure: Secondary | ICD-10-CM | POA: Diagnosis present

## 2013-01-28 DIAGNOSIS — I10 Essential (primary) hypertension: Secondary | ICD-10-CM | POA: Diagnosis present

## 2013-01-28 DIAGNOSIS — E785 Hyperlipidemia, unspecified: Secondary | ICD-10-CM | POA: Diagnosis present

## 2013-01-28 DIAGNOSIS — M129 Arthropathy, unspecified: Secondary | ICD-10-CM | POA: Diagnosis present

## 2013-01-28 DIAGNOSIS — T8484XA Pain due to internal orthopedic prosthetic devices, implants and grafts, initial encounter: Secondary | ICD-10-CM

## 2013-01-28 DIAGNOSIS — I4891 Unspecified atrial fibrillation: Secondary | ICD-10-CM | POA: Diagnosis present

## 2013-01-28 DIAGNOSIS — Z96659 Presence of unspecified artificial knee joint: Secondary | ICD-10-CM

## 2013-01-28 DIAGNOSIS — Z853 Personal history of malignant neoplasm of breast: Secondary | ICD-10-CM

## 2013-01-28 DIAGNOSIS — T84039A Mechanical loosening of unspecified internal prosthetic joint, initial encounter: Principal | ICD-10-CM | POA: Diagnosis present

## 2013-01-28 DIAGNOSIS — K219 Gastro-esophageal reflux disease without esophagitis: Secondary | ICD-10-CM | POA: Diagnosis present

## 2013-01-28 HISTORY — PX: TOTAL KNEE REVISION: SHX996

## 2013-01-28 LAB — GLUCOSE, CAPILLARY
Glucose-Capillary: 73 mg/dL (ref 70–99)
Glucose-Capillary: 97 mg/dL (ref 70–99)
Glucose-Capillary: 108 mg/dL — ABNORMAL HIGH (ref 70–99)

## 2013-01-28 LAB — GRAM STAIN

## 2013-01-28 SURGERY — TOTAL KNEE REVISION
Anesthesia: Spinal | Site: Knee | Laterality: Left

## 2013-01-28 MED ORDER — TRANEXAMIC ACID 100 MG/ML IV SOLN
1000.0000 mg | INTRAVENOUS | Status: AC
  Administered 2013-01-28: 1000 mg via INTRAVENOUS
  Filled 2013-01-28: qty 10

## 2013-01-28 MED ORDER — ONDANSETRON HCL 4 MG PO TABS: 4.0000 mg | ORAL_TABLET | Freq: Four times a day (QID) | ORAL | Status: DC | PRN

## 2013-01-28 MED ORDER — POVIDONE-IODINE 10 % EX SOLN
CUTANEOUS | Status: DC | PRN
  Administered 2013-01-28: 1 via TOPICAL

## 2013-01-28 MED ORDER — KETOROLAC TROMETHAMINE 30 MG/ML IJ SOLN
INTRAMUSCULAR | Status: AC
  Filled 2013-01-28: qty 1

## 2013-01-28 MED ORDER — ALBUTEROL SULFATE HFA 108 (90 BASE) MCG/ACT IN AERS
1.0000 | INHALATION_SPRAY | Freq: Four times a day (QID) | RESPIRATORY_TRACT | Status: DC | PRN
Start: 2013-01-28 — End: 2013-01-31
  Filled 2013-01-28: qty 6.7

## 2013-01-28 MED ORDER — CELECOXIB 200 MG PO CAPS
200.0000 mg | ORAL_CAPSULE | Freq: Two times a day (BID) | ORAL | Status: DC
  Administered 2013-01-28 – 2013-01-31 (×6): 200 mg via ORAL
  Filled 2013-01-28 (×7): qty 1

## 2013-01-28 MED ORDER — LIDOCAINE HCL (CARDIAC) 20 MG/ML IV SOLN
INTRAVENOUS | Status: DC | PRN
  Administered 2013-01-28: 100 mg via INTRAVENOUS

## 2013-01-28 MED ORDER — LACTATED RINGERS IV SOLN
INTRAVENOUS | Status: DC
  Administered 2013-01-28: 12:00:00 via INTRAVENOUS

## 2013-01-28 MED ORDER — ACETAMINOPHEN 650 MG RE SUPP: 650.0000 mg | Freq: Four times a day (QID) | RECTAL | Status: DC | PRN

## 2013-01-28 MED ORDER — METOPROLOL TARTRATE 50 MG PO TABS
50.0000 mg | ORAL_TABLET | Freq: Two times a day (BID) | ORAL | Status: DC
  Administered 2013-01-29 – 2013-01-31 (×5): 50 mg via ORAL
  Filled 2013-01-28 (×7): qty 1

## 2013-01-28 MED ORDER — MENTHOL 3 MG MT LOZG: 1.0000 | LOZENGE | OROMUCOSAL | Status: DC | PRN

## 2013-01-28 MED ORDER — SORBITOL 70 % SOLN: 30.0000 mL | Freq: Every day | Status: DC | PRN

## 2013-01-28 MED ORDER — METOCLOPRAMIDE HCL 5 MG/ML IJ SOLN: 5.0000 mg | Freq: Three times a day (TID) | INTRAMUSCULAR | Status: DC | PRN

## 2013-01-28 MED ORDER — ACETAMINOPHEN 500 MG PO TABS
1000.0000 mg | ORAL_TABLET | Freq: Four times a day (QID) | ORAL | Status: AC
  Administered 2013-01-28 – 2013-01-29 (×4): 1000 mg via ORAL
  Filled 2013-01-28 (×5): qty 2

## 2013-01-28 MED ORDER — SODIUM CHLORIDE 0.9 % IJ SOLN: Freq: Once | INTRAMUSCULAR | Status: DC

## 2013-01-28 MED ORDER — CEFAZOLIN SODIUM-DEXTROSE 2-3 GM-% IV SOLR
INTRAVENOUS | Status: DC | PRN
  Administered 2013-01-28: 2 g via INTRAVENOUS

## 2013-01-28 MED ORDER — OXYCODONE HCL ER 10 MG PO T12A
10.0000 mg | EXTENDED_RELEASE_TABLET | Freq: Two times a day (BID) | ORAL | Status: DC
  Administered 2013-01-28 – 2013-01-31 (×6): 10 mg via ORAL
  Filled 2013-01-28 (×6): qty 1

## 2013-01-28 MED ORDER — SENNA 8.6 MG PO TABS
1.0000 | ORAL_TABLET | Freq: Two times a day (BID) | ORAL | Status: DC
  Administered 2013-01-28 – 2013-01-31 (×6): 8.6 mg via ORAL
  Filled 2013-01-28 (×7): qty 1

## 2013-01-28 MED ORDER — PROMETHAZINE HCL 25 MG/ML IJ SOLN: 6.2500 mg | INTRAMUSCULAR | Status: DC | PRN

## 2013-01-28 MED ORDER — DILTIAZEM HCL ER COATED BEADS 120 MG PO CP24
120.0000 mg | ORAL_CAPSULE | Freq: Every day | ORAL | Status: DC
  Administered 2013-01-29 – 2013-01-31 (×3): 120 mg via ORAL
  Filled 2013-01-28 (×4): qty 1

## 2013-01-28 MED ORDER — LACTATED RINGERS IV SOLN
INTRAVENOUS | Status: DC | PRN
  Administered 2013-01-28 (×2): via INTRAVENOUS

## 2013-01-28 MED ORDER — METHOCARBAMOL 500 MG PO TABS
ORAL_TABLET | ORAL | Status: AC
  Filled 2013-01-28: qty 1

## 2013-01-28 MED ORDER — CHLORHEXIDINE GLUCONATE 4 % EX LIQD: 60.0000 mL | Freq: Once | CUTANEOUS | Status: DC

## 2013-01-28 MED ORDER — MEPERIDINE HCL 25 MG/ML IJ SOLN: 6.2500 mg | INTRAMUSCULAR | Status: DC | PRN

## 2013-01-28 MED ORDER — OXYCODONE HCL 5 MG PO TABS
5.0000 mg | ORAL_TABLET | Freq: Once | ORAL | Status: AC | PRN
  Administered 2013-01-28: 5 mg via ORAL

## 2013-01-28 MED ORDER — ALUM & MAG HYDROXIDE-SIMETH 200-200-20 MG/5ML PO SUSP: 30.0000 mL | ORAL | Status: DC | PRN

## 2013-01-28 MED ORDER — HYDROMORPHONE HCL PF 1 MG/ML IJ SOLN: 0.2500 mg | INTRAMUSCULAR | Status: DC | PRN

## 2013-01-28 MED ORDER — KETOROLAC TROMETHAMINE 30 MG/ML IJ SOLN
30.0000 mg | Freq: Four times a day (QID) | INTRAMUSCULAR | Status: AC | PRN
  Administered 2013-01-28: 30 mg via INTRAVENOUS
  Filled 2013-01-28: qty 1

## 2013-01-28 MED ORDER — OXYCODONE HCL 5 MG/5ML PO SOLN: 5.0000 mg | Freq: Once | ORAL | Status: AC | PRN

## 2013-01-28 MED ORDER — DIPHENHYDRAMINE HCL 12.5 MG/5ML PO ELIX: 25.0000 mg | ORAL_SOLUTION | ORAL | Status: DC | PRN

## 2013-01-28 MED ORDER — PHENYLEPHRINE HCL 10 MG/ML IJ SOLN
10.0000 mg | INTRAVENOUS | Status: DC | PRN
  Administered 2013-01-28: 20 ug/min via INTRAVENOUS

## 2013-01-28 MED ORDER — SODIUM CHLORIDE 0.9 % IR SOLN
Status: DC | PRN
  Administered 2013-01-28: 3000 mL

## 2013-01-28 MED ORDER — BUPIVACAINE LIPOSOME 1.3 % IJ SUSP
20.0000 mL | INTRAMUSCULAR | Status: AC
  Filled 2013-01-28: qty 20

## 2013-01-28 MED ORDER — CEFAZOLIN SODIUM-DEXTROSE 2-3 GM-% IV SOLR
2.0000 g | Freq: Three times a day (TID) | INTRAVENOUS | Status: AC
  Administered 2013-01-28 – 2013-01-29 (×2): 2 g via INTRAVENOUS
  Filled 2013-01-28 (×2): qty 50

## 2013-01-28 MED ORDER — PROPOFOL INFUSION 10 MG/ML OPTIME
INTRAVENOUS | Status: DC | PRN
  Administered 2013-01-28: 25 ug/kg/min via INTRAVENOUS

## 2013-01-28 MED ORDER — METHOCARBAMOL 100 MG/ML IJ SOLN
500.0000 mg | Freq: Four times a day (QID) | INTRAVENOUS | Status: DC | PRN
  Filled 2013-01-28: qty 5

## 2013-01-28 MED ORDER — SODIUM CHLORIDE 0.9 % IJ SOLN
INTRAMUSCULAR | Status: DC | PRN
  Administered 2013-01-28: 15:00:00

## 2013-01-28 MED ORDER — MIDAZOLAM HCL 5 MG/5ML IJ SOLN
INTRAMUSCULAR | Status: DC | PRN
  Administered 2013-01-28 (×2): 1 mg via INTRAVENOUS

## 2013-01-28 MED ORDER — MAGNESIUM CITRATE PO SOLN: 1.0000 | Freq: Once | ORAL | Status: AC | PRN

## 2013-01-28 MED ORDER — OXYCODONE HCL 5 MG PO TABS
ORAL_TABLET | ORAL | Status: AC
  Filled 2013-01-28: qty 1

## 2013-01-28 MED ORDER — SODIUM CHLORIDE 0.9 % IR SOLN
Status: DC | PRN
  Administered 2013-01-28: 1000 mL

## 2013-01-28 MED ORDER — ONDANSETRON HCL 4 MG/2ML IJ SOLN
INTRAMUSCULAR | Status: DC | PRN
  Administered 2013-01-28: 4 mg via INTRAVENOUS

## 2013-01-28 MED ORDER — SODIUM CHLORIDE 0.9 % IJ SOLN
INTRAMUSCULAR | Status: AC
  Filled 2013-01-28: qty 6

## 2013-01-28 MED ORDER — POLYETHYLENE GLYCOL 3350 17 G PO PACK: 17.0000 g | PACK | Freq: Every day | ORAL | Status: DC | PRN

## 2013-01-28 MED ORDER — SODIUM CHLORIDE 0.9 % IV SOLN
INTRAVENOUS | Status: DC
  Administered 2013-01-28 – 2013-01-29 (×3): via INTRAVENOUS

## 2013-01-28 MED ORDER — ACETAMINOPHEN 325 MG PO TABS: 650.0000 mg | ORAL_TABLET | Freq: Four times a day (QID) | ORAL | Status: DC | PRN

## 2013-01-28 MED ORDER — PHENOL 1.4 % MT LIQD: 1.0000 | OROMUCOSAL | Status: DC | PRN

## 2013-01-28 MED ORDER — CEFAZOLIN SODIUM-DEXTROSE 2-3 GM-% IV SOLR
INTRAVENOUS | Status: AC
  Filled 2013-01-28: qty 50

## 2013-01-28 MED ORDER — OXYCODONE HCL 5 MG PO TABS
5.0000 mg | ORAL_TABLET | ORAL | Status: DC | PRN
  Administered 2013-01-28: 10 mg via ORAL
  Administered 2013-01-29 – 2013-01-30 (×3): 5 mg via ORAL
  Filled 2013-01-28: qty 2
  Filled 2013-01-28: qty 3
  Filled 2013-01-28 (×2): qty 1
  Filled 2013-01-28: qty 2

## 2013-01-28 MED ORDER — ASPIRIN EC 325 MG PO TBEC
325.0000 mg | DELAYED_RELEASE_TABLET | Freq: Two times a day (BID) | ORAL | Status: DC
Start: 2013-01-28 — End: 2013-01-31
  Administered 2013-01-28 – 2013-01-31 (×6): 325 mg via ORAL
  Filled 2013-01-28 (×8): qty 1

## 2013-01-28 MED ORDER — METHOCARBAMOL 500 MG PO TABS
500.0000 mg | ORAL_TABLET | Freq: Four times a day (QID) | ORAL | Status: DC | PRN
  Administered 2013-01-28: 500 mg via ORAL
  Filled 2013-01-28: qty 1

## 2013-01-28 MED ORDER — MORPHINE SULFATE 2 MG/ML IJ SOLN
2.0000 mg | INTRAMUSCULAR | Status: DC | PRN
Start: 2013-01-28 — End: 2013-01-31
  Administered 2013-01-28: 2 mg via INTRAVENOUS
  Filled 2013-01-28: qty 1

## 2013-01-28 MED ORDER — METOCLOPRAMIDE HCL 10 MG PO TABS: 5.0000 mg | ORAL_TABLET | Freq: Three times a day (TID) | ORAL | Status: DC | PRN

## 2013-01-28 MED ORDER — ONDANSETRON HCL 4 MG/2ML IJ SOLN: 4.0000 mg | Freq: Four times a day (QID) | INTRAMUSCULAR | Status: DC | PRN

## 2013-01-28 SURGICAL SUPPLY — 79 items
BANDAGE ELASTIC 6 VELCRO ST LF (GAUZE/BANDAGES/DRESSINGS) ×2 IMPLANT
BANDAGE ESMARK 6X9 LF (GAUZE/BANDAGES/DRESSINGS) ×1 IMPLANT
BLADE SAG 18X100X1.27 (BLADE) ×2 IMPLANT
BLADE SAGITTAL 25.0X1.27X90 (BLADE) ×2 IMPLANT
BLADE SAW SGTL 13.0X1.19X90.0M (BLADE) ×4 IMPLANT
BLADE SURG ROTATE 9660 (MISCELLANEOUS) IMPLANT
BNDG ESMARK 6X9 LF (GAUZE/BANDAGES/DRESSINGS) ×2
BONE CEMENT PALACOS R-G (Orthopedic Implant) ×4 IMPLANT
BOWL SMART MIX CTS (DISPOSABLE) ×2 IMPLANT
CEMENT BONE PALACOS R-G (Orthopedic Implant) ×2 IMPLANT
CLOTH BEACON ORANGE TIMEOUT ST (SAFETY) ×2 IMPLANT
CONT SPEC 4OZ CLIKSEAL STRL BL (MISCELLANEOUS) ×2 IMPLANT
COVER BACK TABLE 24X17X13 BIG (DRAPES) IMPLANT
COVER SURGICAL LIGHT HANDLE (MISCELLANEOUS) ×2 IMPLANT
CUFF TOURNIQUET SINGLE 34IN LL (TOURNIQUET CUFF) ×2 IMPLANT
CUFF TOURNIQUET SINGLE 44IN (TOURNIQUET CUFF) IMPLANT
DRAPE EXTREMITY T 121X128X90 (DRAPE) ×2 IMPLANT
DRAPE ORTHO SPLIT 77X108 STRL (DRAPES) ×2
DRAPE SURG ORHT 6 SPLT 77X108 (DRAPES) ×2 IMPLANT
DRAPE U-SHAPE 47X51 STRL (DRAPES) ×2 IMPLANT
DRSG PAD ABDOMINAL 8X10 ST (GAUZE/BANDAGES/DRESSINGS) ×4 IMPLANT
DURAPREP 26ML APPLICATOR (WOUND CARE) ×2 IMPLANT
ELECT REM PT RETURN 9FT ADLT (ELECTROSURGICAL) ×2
ELECTRODE REM PT RTRN 9FT ADLT (ELECTROSURGICAL) ×1 IMPLANT
EVACUATOR 1/8 PVC DRAIN (DRAIN) IMPLANT
FACESHIELD STD STERILE (MASK) ×4 IMPLANT
FEM COMP 4 LEFT KNEE (Knees) ×2 IMPLANT
FEMORAL GENESIS PRIM LUGS (Orthopedic Implant) ×4 IMPLANT
GAUZE XEROFORM 1X8 LF (GAUZE/BANDAGES/DRESSINGS) ×2 IMPLANT
GAUZE XEROFORM 5X9 LF (GAUZE/BANDAGES/DRESSINGS) ×2 IMPLANT
GLOVE BIO SURGEON STRL SZ8 (GLOVE) ×2 IMPLANT
GLOVE BIOGEL PI IND STRL 7.5 (GLOVE) ×1 IMPLANT
GLOVE BIOGEL PI IND STRL 8 (GLOVE) ×1 IMPLANT
GLOVE BIOGEL PI INDICATOR 7.5 (GLOVE) ×1
GLOVE BIOGEL PI INDICATOR 8 (GLOVE) ×1
GLOVE ECLIPSE 7.0 STRL STRAW (GLOVE) ×2 IMPLANT
GLOVE ORTHO TXT STRL SZ7.5 (GLOVE) ×2 IMPLANT
GOWN PREVENTION PLUS LG XLONG (DISPOSABLE) ×4 IMPLANT
GOWN PREVENTION PLUS XLARGE (GOWN DISPOSABLE) ×2 IMPLANT
GOWN STRL NON-REIN LRG LVL3 (GOWN DISPOSABLE) ×2 IMPLANT
HANDPIECE INTERPULSE COAX TIP (DISPOSABLE) ×1
INSERT ARTICULAR 13MM (Knees) ×2 IMPLANT
KIT BASIN OR (CUSTOM PROCEDURE TRAY) ×2 IMPLANT
KIT ROOM TURNOVER OR (KITS) ×2 IMPLANT
MANIFOLD NEPTUNE II (INSTRUMENTS) ×2 IMPLANT
NEEDLE 18GX1X1/2 (RX/OR ONLY) (NEEDLE) ×2 IMPLANT
NEEDLE SPNL 18GX3.5 QUINCKE PK (NEEDLE) ×2 IMPLANT
NS IRRIG 1000ML POUR BTL (IV SOLUTION) ×2 IMPLANT
PACK TOTAL JOINT (CUSTOM PROCEDURE TRAY) ×2 IMPLANT
PAD ABD 8X10 STRL (GAUZE/BANDAGES/DRESSINGS) ×2 IMPLANT
PAD ARMBOARD 7.5X6 YLW CONV (MISCELLANEOUS) ×4 IMPLANT
PADDING CAST COTTON 6X4 STRL (CAST SUPPLIES) ×2 IMPLANT
PIN TROCAR 3 INCH (PIN) ×8 IMPLANT
RASP HELIOCORDIAL MED (MISCELLANEOUS) IMPLANT
SET HNDPC FAN SPRY TIP SCT (DISPOSABLE) ×1 IMPLANT
SET PAD KNEE POSITIONER (MISCELLANEOUS) ×2 IMPLANT
SPONGE GAUZE 4X4 12PLY (GAUZE/BANDAGES/DRESSINGS) ×2 IMPLANT
STAPLER VISISTAT 35W (STAPLE) ×2 IMPLANT
SUCTION FRAZIER TIP 10 FR DISP (SUCTIONS) ×2 IMPLANT
SUT ETHILON 2 0 FS 18 (SUTURE) ×6 IMPLANT
SUT PDS AB 1 CT  36 (SUTURE) ×2
SUT PDS AB 1 CT 36 (SUTURE) ×2 IMPLANT
SUT VIC AB 0 CT1 27 (SUTURE) ×2
SUT VIC AB 0 CT1 27XBRD ANBCTR (SUTURE) ×2 IMPLANT
SUT VIC AB 1 CT1 27 (SUTURE)
SUT VIC AB 1 CT1 27XBRD ANBCTR (SUTURE) IMPLANT
SUT VIC AB 2-0 CT1 27 (SUTURE) ×4
SUT VIC AB 2-0 CT1 TAPERPNT 27 (SUTURE) ×4 IMPLANT
SUT VLOC 180 0 24IN GS25 (SUTURE) IMPLANT
SWAB COLLECTION DEVICE MRSA (MISCELLANEOUS) ×2 IMPLANT
SYR 20CC LL (SYRINGE) ×2 IMPLANT
SYR 50ML LL SCALE MARK (SYRINGE) ×2 IMPLANT
TOWEL OR 17X24 6PK STRL BLUE (TOWEL DISPOSABLE) ×2 IMPLANT
TOWEL OR 17X26 10 PK STRL BLUE (TOWEL DISPOSABLE) ×2 IMPLANT
TRAY FOLEY CATH 14FRSI W/METER (CATHETERS) ×2 IMPLANT
TRAY FOLEY CATH 16FRSI W/METER (SET/KITS/TRAYS/PACK) IMPLANT
TUBE ANAEROBIC SPECIMEN COL (MISCELLANEOUS) ×4 IMPLANT
WATER STERILE IRR 1000ML POUR (IV SOLUTION) ×6 IMPLANT
WEDGE FEMORAL LNG 5MM (Knees) ×4 IMPLANT

## 2013-01-28 NOTE — Anesthesia Preprocedure Evaluation (Addendum)
Anesthesia Evaluation  Patient identified by MRN, date of birth, ID band Patient awake    Reviewed: Allergy & Precautions, H&P , NPO status , Patient's Chart, lab work & pertinent test results, reviewed documented beta blocker date and time   Airway Mallampati: II TM Distance: >3 FB Neck ROM: Full    Dental  (+) Dental Advisory Given, Edentulous Upper and Edentulous Lower   Pulmonary neg pulmonary ROS,  breath sounds clear to auscultation        Cardiovascular hypertension, Pt. on medications and Pt. on home beta blockers negative cardio ROS  + dysrhythmias Atrial Fibrillation Rhythm:Irregular Rate:Normal     Neuro/Psych negative neurological ROS  negative psych ROS   GI/Hepatic Neg liver ROS, GERD-  ,  Endo/Other  negative endocrine ROSdiabetes, Type 2  Renal/GU negative Renal ROS     Musculoskeletal negative musculoskeletal ROS (+)   Abdominal   Peds  Hematology negative hematology ROS (+)   Anesthesia Other Findings   Reproductive/Obstetrics negative OB ROS                          Anesthesia Physical Anesthesia Plan  ASA: III  Anesthesia Plan: Spinal   Post-op Pain Management:    Induction:   Airway Management Planned:   Additional Equipment:   Intra-op Plan:   Post-operative Plan:   Informed Consent: I have reviewed the patients History and Physical, chart, labs and discussed the procedure including the risks, benefits and alternatives for the proposed anesthesia with the patient or authorized representative who has indicated his/her understanding and acceptance.   Dental advisory given  Plan Discussed with: CRNA  Anesthesia Plan Comments:         Anesthesia Quick Evaluation

## 2013-01-28 NOTE — Op Note (Addendum)
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  Surgeon: Glee Arvin, M.D.  Co-surgeon: Doneen Poisson, M.D.  Asst.: Richardean Canal, PA-C  Anesthesia: Spinal and local  Implants: [employer] PS  Estimated blood loss: Minimal  Drains: Hemovac  Complications: None  Condition to PACU: Stable  Indications for procedure: The patient is a 78 year old female who I've seen in the clinic who presents today with surgical management of a painful left total knee replacement. She undergone preoperative workup for infection and this was ruled out. The risks, benefits, and alternatives to surgery were once again discussed with the patient and the daughter and they elected to undergo surgery.  Description of procedure: The patient was identified in the preoperative holding area. The operative site and the procedure were confirmed by the patient and marked by the surgeon. She was brought back to the operating room. A timeout was performed.  Spinal anesthesia was induced by the anesthesiologist. She was placed supine on the table. A nonsterile tourniquet was placed on the upper left thigh. Pre-incisional antibiotics were held in anticipation for intraoperative cultures and specimens area the left lower extremity was prepped and draped in standard sterile fashion. The tourniquet was inflated to 350 mm Hg.  We began by excising the old surgical scar. Blunt dissection was taken down to the level of the retinaculum.  Full-thickness flaps were created on the medial side and on the lateral side. The patellar tendon and the quadriceps tendon and medial retinaculum were identified and marked with a purple pen. The arthrotomy was then created. There was a large effusion.  We first began with debridement of the knee  joint gain adequate exposure.  The medial and lateral gutters and superior pouch were debrided of synovitis and inflamed tissue.  Once this was done the knee was brought up into flexion and the polyethylene liner was extracted. The PCL was resected.  Once this was done the femoral component was tested and did not require any significant effort to extract.  The component was grossly loose. There was a small amount of cement that was retained. There was not any significant bone loss. At that time samples were taken from the knee joint and sent off for frozen section. While we waited for the frozen section to come back we proceeded with the surgery.  We tested the tibial implant for loosening and no loosening was appreciated.  The cement mantle was intact and stable.  We gained access to the femoral canal using the intramedullary rod. The jig was then fitted to the distal femur. This was pinned. And angel wing was used to assess the cut.  The distal femoral cut was freshened up. We then moved to the flexion gap. In order to determine our rotation of the femoral component, I placed a 9 mm dog bone on the tibial tray and allowed the femoral component to rest flush. We then pinned the femoral sizer in its place. The dog bone was taken out and the anterior and anterior and posterior chamfer cuts were then made. There was no bone that was taken off with the posterior condylar cut. Once this was done we curetted out the old lug holes. We placed the femoral trial on the femur it appeared that we needed a 5 mm posterior condylar augment. Placed a trial on the femur with the  augments and then cut our box. Once this was done a trial 13 mm constrained liner was placed. We were able to achieve full extension and over 120 of flexion without liftoff of the polyethylene liner. The knee was ligamentously stable in full extension, 30 of flexion, 60 of flexion and 90 of flexion. We then took the components out and prepared the bone  surface. We irrigated 3 L of normal saline using pulse lavage.  A 20cc 1.3% solution of exparel diluded down with 40 cc with normal saline was used to inject the posterior capsule and the anterior medial and medial proximal tibia.  We then dried the femoral bone surface and cemented in the femoral component. Once the cement hardened, the trial poly was taken out and the posterior knee was checked for any loose cement.  The final 13 mm constrained liner was then placed.  The knee was then checked again and was deemed to be stable. A medium Hemovac was placed in the joint and the arthrotomy was closed using interrupted #1 PDS suture. 2-0 Vicryl was used to close the subcutaneous layer. 2-0 nylon was used to close the skin. Dermabond was placed on the incision. A sterile dressing was placed. The patient was transferred to the PACU in stable condition.  Disposition: Patient will be admitted to the orthopedic unit. She will be weightbearing as tolerated. She will be on a CPM machine while she is in the hospital. We will monitor the drain output and discontinue it when it is appropriate. She will be on aspirin 325 twice a day for DVT chemoprophylaxis. She will also be placed on SCDs for mechanical DVT prophylaxis.  She will be up with PT and OT in the morning.  Mayra Reel, MD [facility] [phone] 5:09 PM

## 2013-01-28 NOTE — H&P (Signed)
PREOPERATIVE H&P  Chief Complaint: Painful left total knee replacement  HPI: [name] is a 79 y.o. female who presents for surgical treatment of Painful left total knee replacement.  She denies any changes in medical history.  Past Medical History  Diagnosis Date  . Diabetes mellitus   . Hypertension   . Atrial fibrillation     Diagnosed ~2009  . GERD (gastroesophageal reflux disease)   . Breast cancer, left breast     S/p chemo and mastectomy 1998  . Hyperlipidemia   . Left shoulder pain     "MRI showed pinched nerve" - per pt  . Arthritis    Past Surgical History  Procedure Laterality Date  . Cholecystectomy    . Knee surgery      left  . Mastectomy      left, 1998  . Joint replacement Left 2001  . Eye surgery Bilateral     cataracts   History   Social History  . Marital Status: Single    Spouse Name: N/A    Number of Children: N/A  . Years of Education: N/A   Social History Main Topics  . Smoking status: Never Smoker   . Smokeless tobacco: Current User    Types: Chew  . Alcohol Use: No  . Drug Use: No  . Sexual Activity: No   Other Topics Concern  . None   Social History Narrative   Moved from the [place] area to [place] ~ 29yrs ago to live with her daughter   Family History  Problem Relation Age of Onset  . Heart attack Mother     102s   No Known Allergies Prior to Admission medications   Medication Sig Start Date End Date Taking? Authorizing Provider  Cholecalciferol (VITAMIN D PO) Take 1 capsule by mouth daily.   Yes Historical Provider, MD  HYDROcodone-acetaminophen (NORCO/VICODIN) 5-325 MG per tablet Take 2 tablets by mouth every 4 (four) hours as needed for pain. 11/16/12  Yes Lonia Skinner Sofia, PA-C  metoprolol (LOPRESSOR) 50 MG tablet Take 50 mg by mouth 2 (two) times daily.     Yes Historical Provider, MD  albuterol (PROVENTIL HFA;VENTOLIN HFA) 108 (90 BASE) MCG/ACT inhaler Inhale 1 puff into the lungs every 6 (six) hours as  needed for wheezing or shortness of breath.    Historical Provider, MD  diltiazem (CARDIZEM CD) 120 MG 24 hr capsule Take 120 mg by mouth daily.    Historical Provider, MD     Positive ROS: All other systems have been reviewed and were otherwise negative with the exception of those mentioned in the HPI and as above.  Physical Exam: General: Alert, no acute distress Cardiovascular: No pedal edema Respiratory: No cyanosis, no use of accessory musculature GI: No organomegaly, abdomen is soft and non-tender Skin: No lesions in the area of chief complaint Neurologic: Sensation intact distally Psychiatric: Patient is competent for consent with normal mood and affect Lymphatic: No axillary or cervical lymphadenopathy  MUSCULOSKELETAL:  LLE - well healed incision - ROM 0 - 105  Assessment: Painful left total knee replacement  Plan: Plan for Procedure(s): LEFT TOTAL KNEE ARTHROPLASTY REVISION  The risks benefits and alternatives were discussed with the patient including but not limited to the risks of nonoperative treatment, versus surgical intervention including infection, bleeding, nerve injury,  blood clots, cardiopulmonary complications, morbidity, mortality, among others, and they were willing to proceed.   Cheral Almas, MD   01/28/2013 1:51 PM

## 2013-01-28 NOTE — OR Nursing (Signed)
Rund, MD called to [facility], at 1545 on 01-28-2013, and reported results of frozen section from left knee synovium to Roda Shutters, MD - "reactive tissue, no significant acute inflamation."

## 2013-01-28 NOTE — OR Nursing (Signed)
Spoke with Misty Stanley in Microbiology at 1704 on 01-28-13, results of stat gram stain on synovium from left knee arthroplasty revision were " red WBC's, predominately monos, no organisms." Per Roda Shutters, MD request, anaerobic, aerobic, and fungal cultures were cancelled at 1704 on 01-28-13, per [facility] in Microbiology.

## 2013-01-28 NOTE — Plan of Care (Signed)
Problem: Consults Goal: Diagnosis- Total Joint Replacement Revision Total Knee: Left     

## 2013-01-28 NOTE — Progress Notes (Signed)
Orthopedic Tech Progress Note Patient Details:  [name] 1933/11/20 [id number]  CPM Left Knee CPM Left Knee: On Left Knee Flexion (Degrees): 60 Left Knee Extension (Degrees): 0 Additional Comments: put ohf on bed   Jennye Moccasin 01/28/2013, 5:59 PM

## 2013-01-28 NOTE — Transfer of Care (Signed)
Immediate Anesthesia Transfer of Care Note  Patient: [name]  Procedure(s) Performed: Procedure(s): LEFT TOTAL KNEE ARTHROPLASTY REVISION (Left)  Patient Location: PACU  Anesthesia Type:Spinal  Level of Consciousness: awake, alert , oriented and patient cooperative  Airway & Oxygen Therapy: Patient Spontanous Breathing and Patient connected to nasal cannula oxygen  Post-op Assessment: Report given to PACU RN and Post -op Vital signs reviewed and stable  Post vital signs: Reviewed and stable  Complications: No apparent anesthesia complications

## 2013-01-28 NOTE — Anesthesia Procedure Notes (Addendum)
Spinal  Patient location during procedure: OR Start time: 01/28/2013 2:00 PM End time: 01/28/2013 2:06 PM Staffing Anesthesiologist: Lewie Loron R Performed by: anesthesiologist  Preanesthetic Checklist Completed: patient identified, site marked, surgical consent, pre-op evaluation, timeout performed, IV checked, risks and benefits discussed and monitors and equipment checked Spinal Block Patient position: sitting Prep: Betadine Patient monitoring: heart rate, continuous pulse ox and blood pressure Approach: midline Location: L3-4 Injection technique: single-shot Needle Needle type: Quincke  Needle gauge: 22 G Needle length: 9 cm Assessment Sensory level: T8 Additional Notes Expiration date of kit checked and confirmed. Patient tolerated procedure well, without complications.   Performed by: Coralee Rud    Procedure Name: MAC Date/Time: 01/28/2013 2:10 PM Performed by: Coralee Rud Pre-anesthesia Checklist: Patient identified Patient Re-evaluated:Patient Re-evaluated prior to inductionOxygen Delivery Method: Simple face mask Preoxygenation: Pre-oxygenation with 100% oxygen Intubation Type: IV induction

## 2013-01-29 LAB — GLUCOSE, CAPILLARY
Glucose-Capillary: 110 mg/dL — ABNORMAL HIGH (ref 70–99)
Glucose-Capillary: 114 mg/dL — ABNORMAL HIGH (ref 70–99)
Glucose-Capillary: 122 mg/dL — ABNORMAL HIGH (ref 70–99)

## 2013-01-29 LAB — CBC
HCT: 30.5 % — ABNORMAL LOW (ref 36.0–46.0)
MCV: 91.6 fL (ref 78.0–100.0)
Platelets: 221 10*3/uL (ref 150–400)
RBC: 3.33 MIL/uL — ABNORMAL LOW (ref 3.87–5.11)
RDW: 13.4 % (ref 11.5–15.5)
WBC: 9.4 10*3/uL (ref 4.0–10.5)

## 2013-01-29 LAB — BASIC METABOLIC PANEL
CO2: 25 mEq/L (ref 19–32)
Calcium: 8.3 mg/dL — ABNORMAL LOW (ref 8.4–10.5)
Chloride: 103 mEq/L (ref 96–112)
Creatinine, Ser: 0.87 mg/dL (ref 0.50–1.10)
Glucose, Bld: 112 mg/dL — ABNORMAL HIGH (ref 70–99)

## 2013-01-29 MED ORDER — ASPIRIN EC 325 MG PO TBEC: 325.0000 mg | DELAYED_RELEASE_TABLET | Freq: Two times a day (BID) | ORAL | Status: DC

## 2013-01-29 MED ORDER — OXYCODONE HCL 5 MG PO TABS: 5.0000 mg | ORAL_TABLET | ORAL | Status: DC | PRN

## 2013-01-29 NOTE — Anesthesia Postprocedure Evaluation (Signed)
  Anesthesia Post-op Note  Patient: [name]  Procedure(s) Performed: Procedure(s): LEFT TOTAL KNEE ARTHROPLASTY REVISION (Left)  Patient Location: PACU  Anesthesia Type:Spinal  Level of Consciousness: awake, alert , oriented and patient cooperative  Airway and Oxygen Therapy: Patient Spontanous Breathing  Post-op Pain: none  Post-op Assessment: Post-op Vital signs reviewed, Patient's Cardiovascular Status Stable, Respiratory Function Stable, No signs of Nausea or vomiting and Pain level controlled  Post-op Vital Signs: Reviewed and stable  Complications: No apparent anesthesia complications

## 2013-01-29 NOTE — Progress Notes (Signed)
Orthopedic Tech Progress Note Patient Details:  [name] 05-11-1933 [id number]  Patient ID: [name], female   DOB: [birth year], 79 y.o.   MRN: [id number] Placed pt's lle in cpm @ 0-60 degrees @ 1745  Nikki Dom 01/29/2013, 7:42 PM

## 2013-01-29 NOTE — Progress Notes (Signed)
Patient reported feeling dizzy and shaky.  She stated, "it feels like my sugar is low."  Vital signs and a capillary blood glucose were obtained, both within normal limits.  Pt will continue to be monitored closely.

## 2013-01-29 NOTE — Progress Notes (Signed)
Utilization review completed.  

## 2013-01-29 NOTE — Evaluation (Signed)
Physical Therapy Evaluation Patient Details Name: Cassandra DOBESH MRN: [id number] DOB: [birth year] Today's Date: 01/29/2013 Time: 8657-8469 PT Time Calculation (min): 28 min  PT Assessment / Plan / Recommendation History of Present Illness  79 y.o. s/p LEFT TOTAL KNEE ARTHROPLASTY REVISION (Left); femoral component only  Clinical Impression  This patient underwent a left TKA revision and presents to PT with anticipated post-op decrease in strength and ROM, decreased functional mobility and gait.  Pt. Will benefit from acute PT to address these and below issues. Made good progress with PT today and anticipate she would be ready for Dc home tomorrow ( from PT standpoint) following PT visit if MD feels she is ready.      PT Assessment  Patient needs continued PT services    Follow Up Recommendations  Home health PT;Supervision/Assistance - 24 hour    Does the patient have the potential to tolerate intense rehabilitation      Barriers to Discharge        Equipment Recommendations  None recommended by PT    Recommendations for Other Services     Frequency 7X/week    Precautions / Restrictions Precautions Precautions: Knee;Fall Precaution Booklet Issued: No Precaution Comments: Pt. able to recall "no pillow under knee" without prompting Restrictions Weight Bearing Restrictions: Yes LLE Weight Bearing: Weight bearing as tolerated   Pertinent Vitals/Pain See vitals tab Pain was not a significant limiting factor in her PT session today      Mobility  Bed Mobility Bed Mobility: Not assessed (up in recliner) Supine to Sit: 5: Supervision Sitting - Scoot to Edge of Bed: 4: Min guard Details for Bed Mobility Assistance: Pt able to move to EOB without physical assistance. Cues to be gentle with RLE. Transfers Transfers: Sit to Stand;Stand to Sit Sit to Stand: 4: Min guard;With armrests;With upper extremity assist;From chair/3-in-1 Stand to Sit: 4: Min guard;To  chair/3-in-1;With upper extremity assist;With armrests Details for Transfer Assistance: reminders for technique Ambulation/Gait Ambulation/Gait Assistance: 4: Min guard Ambulation Distance (Feet): 80 Feet Assistive device: Rolling walker Ambulation/Gait Assistance Details: pt. with good sequencing; cued to increase weightbearing as she is able and for heel strike Gait Pattern: Step-to pattern (emerging step through)    Exercises Total Joint Exercises Ankle Circles/Pumps: AROM;Both;20 reps Quad Sets: AROM;Both;10 reps Hip ABduction/ADduction: AROM;Left;10 reps Knee Flexion: AROM;10 reps;Seated Goniometric ROM: -4 to 80   PT Diagnosis: Difficulty walking;Abnormality of gait;Acute pain  PT Problem List: Decreased strength;Decreased range of motion;Decreased activity tolerance;Decreased mobility;Decreased knowledge of use of DME;Decreased knowledge of precautions;Pain PT Treatment Interventions: DME instruction;Gait training;Functional mobility training;Therapeutic activities;Therapeutic exercise;Patient/family education     PT Goals(Current goals can be found in the care plan section) Acute Rehab PT Goals Patient Stated Goal: home on Friday PT Goal Formulation: With patient Time For Goal Achievement: 02/05/13 Potential to Achieve Goals: Good  Visit Information  Last PT Received On: 01/29/13 Assistance Needed: +1 History of Present Illness: 79 y.o. s/p LEFT TOTAL KNEE ARTHROPLASTY REVISION (Left); femoral component only       Prior Functioning  Home Living Family/patient expects to be discharged to:: Private residence Living Arrangements: Children Available Help at Discharge: Available 24 hours/day (daughter available 24/ until after new year) Type of Home: House Home Access: Level entry Home Layout: Two level;Able to live on main level with bedroom/bathroom Home Equipment: Shower seat;Walker - 2 wheels;Cane - single point Prior Function Level of Independence: Needs  assistance Gait / Transfers Assistance Needed: independent with cane on PRN basis ADL's / Homemaking  Assistance Needed: Assistance with bathing Communication Communication: No difficulties    Cognition  Cognition Arousal/Alertness: Awake/alert Behavior During Therapy: WFL for tasks assessed/performed Overall Cognitive Status: Within Functional Limits for tasks assessed    Extremity/Trunk Assessment Upper Extremity Assessment Upper Extremity Assessment: Overall WFL for tasks assessed Lower Extremity Assessment Lower Extremity Assessment: LLE deficits/detail LLE Deficits / Details: good ankle pump and quad set Cervical / Trunk Assessment Cervical / Trunk Assessment: Normal   Balance    End of Session PT - End of Session Equipment Utilized During Treatment: Gait belt Activity Tolerance: Patient tolerated treatment well Patient left: in chair;with call bell/phone within reach Nurse Communication: Mobility status  GP     Ferman Hamming 01/29/2013, 1:00 PM Weldon Picking PT Acute Rehab Services [phone] [facility] [phone]

## 2013-01-29 NOTE — Progress Notes (Addendum)
   Subjective:  Patient reports pain as mild.    Objective:   VITALS:   Filed Vitals:   01/28/13 2111 01/29/13 0000 01/29/13 0400 01/29/13 0625  BP: 102/45   105/48  Pulse: 75   84  Temp: 98.2 F (36.8 C)   98.1 F (36.7 C)  TempSrc:      Resp: 18 16 18 18   Height:      Weight:      SpO2: 99% 99% 100% 100%    Neurologically intact Neurovascular intact Sensation intact distally Intact pulses distally Dorsiflexion/Plantar flexion intact Incision: dressing C/D/I and no drainage No cellulitis present Compartment soft HVAC intact    Lab Results  Component Value Date   WBC 9.4 01/29/2013   HGB 9.8* 01/29/2013   HCT 30.5* 01/29/2013   MCV 91.6 01/29/2013   PLT 221 01/29/2013     Assessment/Plan: 1 Day Post-Op   Problem List Items Addressed This Visit   None      Advance diet Up with PT/OT DVT ppx - SCDs, ambulation, aspirin  WBAT left and lower extremity Foley out this am HVAC d/c'ed CPM 6 hrs a day only while in house.  Does not need CPM at home. Pain control Discharge planning   Cheral Almas 01/29/2013, 8:05 AM [phone]

## 2013-01-29 NOTE — Evaluation (Signed)
Occupational Therapy Evaluation Patient Details Name: [name] MRN: [id number] DOB: [birth year] Today's Date: 01/29/2013 Time: 4098-1191 OT Time Calculation (min): 17 min  OT Assessment / Plan / Recommendation History of present illness 79 y.o. s/p LEFT TOTAL KNEE ARTHROPLASTY REVISION (Left)   Clinical Impression   Pt presents with below problem list. Pt moving well during evaluation. Recommending HHOT upon d/c with 24/7 assistance/supervision.     OT Assessment  Patient needs continued OT Services    Follow Up Recommendations  Home health OT;Supervision/Assistance - 24 hour    Barriers to Discharge      Equipment Recommendations  None recommended by OT    Recommendations for Other Services    Frequency  Min 2X/week    Precautions / Restrictions Precautions Precautions: Knee;Fall Precaution Booklet Issued: No Precaution Comments: Reviewed precautions with pt Restrictions Weight Bearing Restrictions: Yes LLE Weight Bearing: Weight bearing as tolerated   Pertinent Vitals/Pain Pain 6/10 in knee. Repositioned.     ADL  Grooming: Wash/dry hands;Min guard Where Assessed - Grooming: Supported standing Upper Body Dressing: Set up Where Assessed - Upper Body Dressing: Supported sitting Lower Body Dressing: Min guard Where Assessed - Lower Body Dressing: Supported sit to Pharmacist, hospital: Hydrographic surveyor Method: Sit to Barista: Raised toilet seat with arms (or 3-in-1 over toilet) Toileting - Clothing Manipulation and Hygiene: Min guard Where Assessed - Toileting Clothing Manipulation and Hygiene: Sit on 3-in-1 or toilet;Sit to stand from 3-in-1 or toilet Tub/Shower Transfer Method: Not assessed Equipment Used: Gait belt;Rolling walker Transfers/Ambulation Related to ADLs: Min guard; cues for technique with ambulation ADL Comments: Educated on benefit of reaching down to don left sock as it increases ROM in knee. Educated on  dressing technique and wrote it down for pt. Educated to stand in front of chair/bed with walker in front when pulling up LB clothing for safety. OT demonstrated/explained tub transfer techniques.    OT Diagnosis: Acute pain  OT Problem List: Decreased strength;Decreased activity tolerance;Impaired balance (sitting and/or standing);Decreased knowledge of use of DME or AE;Decreased knowledge of precautions;Pain OT Treatment Interventions: Self-care/ADL training;DME and/or AE instruction;Therapeutic activities;Patient/family education;Balance training   OT Goals(Current goals can be found in the care plan section) Acute Rehab OT Goals Patient Stated Goal: not stated OT Goal Formulation: With patient Time For Goal Achievement: 02/05/13 Potential to Achieve Goals: Good ADL Goals Pt Will Perform Grooming: with modified independence;standing Pt Will Perform Lower Body Dressing: with modified independence;sit to/from stand Pt Will Transfer to Toilet: with modified independence;ambulating (3 in 1 over commode) Pt Will Perform Toileting - Clothing Manipulation and hygiene: with modified independence;sit to/from stand Pt Will Perform Tub/Shower Transfer: Tub transfer;with supervision;ambulating;rolling walker (tub equipment tbd)  Visit Information  Last OT Received On: 01/29/13 Assistance Needed: +1 History of Present Illness: 79 y.o. s/p LEFT TOTAL KNEE ARTHROPLASTY REVISION (Left)       Prior Functioning     Home Living Family/patient expects to be discharged to:: Private residence Living Arrangements: Children Available Help at Discharge: Family;Available PRN/intermittently Type of Home: House Home Access: Level entry Home Layout: Two level;Able to live on main level with bedroom/bathroom Home Equipment: Shower seat;Walker - 2 wheels;Cane - single point Prior Function Level of Independence: Needs assistance ADL's / Homemaking Assistance Needed: Assistance with  bathing Communication Communication: No difficulties         Vision/Perception     Cognition  Cognition Arousal/Alertness: Awake/alert Behavior During Therapy: WFL for tasks assessed/performed Overall Cognitive Status:  Within Functional Limits for tasks assessed    Extremity/Trunk Assessment Upper Extremity Assessment Upper Extremity Assessment: Overall WFL for tasks assessed Lower Extremity Assessment Lower Extremity Assessment: Defer to PT evaluation     Mobility Bed Mobility Bed Mobility: Supine to Sit;Sitting - Scoot to Edge of Bed Supine to Sit: 5: Supervision Sitting - Scoot to Edge of Bed: 4: Min guard Details for Bed Mobility Assistance: Pt able to move to EOB without physical assistance. Cues to be gentle with RLE. Transfers Transfers: Sit to Stand;Stand to Sit Sit to Stand: 4: Min guard;With upper extremity assist;From bed;From chair/3-in-1 Stand to Sit: 4: Min guard;To chair/3-in-1 Details for Transfer Assistance: Cues for technique.     Exercise     Balance     End of Session OT - End of Session Equipment Utilized During Treatment: Gait belt;Rolling walker Activity Tolerance: Patient tolerated treatment well Patient left: in chair;with call bell/phone within reach  GO     Earlie Raveling OTR/L [phone] 01/29/2013, 12:40 PM

## 2013-01-29 NOTE — Care Management Note (Signed)
CARE MANAGEMENT NOTE 01/29/2013  Patient:  [name], [name]   Account Number:  [id number]  Date Initiated:  01/29/2013  Documentation initiated by:  Vance Peper  Subjective/Objective Assessment:   79 yr old female s/p left total knee revision.     Action/Plan:   CM spoke with patient concerning home health and DME needs at discharge. Choice offered. Patient lives with her daughter. CM contacted Jodene Nam, RN with referral.   Anticipated DC Date:  01/30/2013   Anticipated DC Plan:  HOME W HOME HEALTH SERVICES      DC Planning Services  CM consult      [facility] Choice  DURABLE MEDICAL EQUIPMENT  HOME HEALTH   Choice offered to / List presented to:  C-1 Patient   DME arranged  3-N-1  Levan Hurst      DME agency  [facility]     HH arranged  HH-2 PT      [facility] agency  [facility]   Status of service:  In process, will continue to follow Medicare Important Message given?   (If response is "NO", the following Medicare IM given date fields will be blank) Date Medicare IM given:   Date Additional Medicare IM given:    Discharge Disposition:  HOME W HOME HEALTH SERVICES  Per UR Regulation:

## 2013-01-30 LAB — GLUCOSE, CAPILLARY: Glucose-Capillary: 91 mg/dL (ref 70–99)

## 2013-01-30 LAB — CBC
MCH: 30 pg (ref 26.0–34.0)
MCHC: 32.9 g/dL (ref 30.0–36.0)
Platelets: 192 10*3/uL (ref 150–400)
RBC: 3.17 MIL/uL — ABNORMAL LOW (ref 3.87–5.11)
RDW: 13.4 % (ref 11.5–15.5)

## 2013-01-30 NOTE — Progress Notes (Signed)
Subjective: 2 Days Post-Op Procedure(s) (LRB): LEFT TOTAL KNEE ARTHROPLASTY REVISION (Left) Patient reports pain as 8 on 0-10 scale.    Objective: Vital signs in last 24 hours: Temp:  [98.1 F (36.7 C)-98.9 F (37.2 C)] 98.8 F (37.1 C) (12/25 0522) Pulse Rate:  [63-88] 65 (12/25 0522) Resp:  [16-18] 16 (12/25 0522) BP: (114-132)/(60-90) 126/90 mmHg (12/25 0522) SpO2:  [95 %-100 %] 95 % (12/25 0522)  Intake/Output from previous day: 12/24 0701 - 12/25 0700 In: 2786.7 [P.O.:620; I.V.:2166.7] Out: -  Intake/Output this shift: Total I/O In: 240 [P.O.:240] Out: -    Recent Labs  01/29/13 0400  HGB 9.8*    Recent Labs  01/29/13 0400  WBC 9.4  RBC 3.33*  HCT 30.5*  PLT 221    Recent Labs  01/29/13 0400  NA 138  K 4.1  CL 103  CO2 25  BUN 11  CREATININE 0.87  GLUCOSE 112*  CALCIUM 8.3*   No results found for this basename: LABPT, INR,  in the last 72 hours  Neurologically intact  Assessment/Plan: 2 Days Post-Op Procedure(s) (LRB): LEFT TOTAL KNEE ARTHROPLASTY REVISION (Left) Up with therapy dressing change  Kashayla Ungerer C 01/30/2013, 8:23 AM

## 2013-01-30 NOTE — Progress Notes (Signed)
Physical Therapy Treatment Patient Details Name: [name] MRN: [id number] DOB: August 24, 1933 Today's Date: 01/30/2013 Time: 4098-1191 PT Time Calculation (min): 24 min  PT Assessment / Plan / Recommendation  History of Present Illness 79 y.o. s/p LEFT TOTAL KNEE ARTHROPLASTY REVISION (Left); femoral component only   PT Comments   Pt progressing very well. Will be ready to d/c tomorrow.  Follow Up Recommendations  Home health PT;Supervision/Assistance - 24 hour     Does the patient have the potential to tolerate intense rehabilitation     Barriers to Discharge        Equipment Recommendations  None recommended by PT    Recommendations for Other Services    Frequency 7X/week   Progress towards PT Goals Progress towards PT goals: Progressing toward goals  Plan Current plan remains appropriate    Precautions / Restrictions Precautions Precautions: Knee;Fall Precaution Booklet Issued: No Restrictions Weight Bearing Restrictions: Yes LLE Weight Bearing: Weight bearing as tolerated   Pertinent Vitals/Pain 4/10 Lt knee with activity    Mobility  Bed Mobility Bed Mobility: Supine to Sit;Sitting - Scoot to Edge of Bed Supine to Sit: 5: Supervision;HOB flat Sitting - Scoot to Edge of Bed: 7: Independent Details for Bed Mobility Assistance: pt assists her LLE off EOB with Bil UEs Transfers Transfers: Sit to Stand;Stand to Sit Sit to Stand: With upper extremity assist;5: Supervision Stand to Sit: To chair/3-in-1;With upper extremity assist;With armrests;5: Supervision Details for Transfer Assistance: no cues needed; supervision for safety Ambulation/Gait Ambulation/Gait Assistance: 4: Min guard Ambulation Distance (Feet): 150 Feet Assistive device: Rolling walker Ambulation/Gait Assistance Details: vc to shorten step length on Lt and keep RW slightly closer to her (pushing too far ahead) Gait Pattern: Step-to pattern (emerging step through)    Exercises Total Joint  Exercises Ankle Circles/Pumps: AROM;Both;10 reps Quad Sets: AROM;Both;10 reps Heel Slides: AAROM;Left;10 reps;Supine Straight Leg Raises: AAROM;Left;10 reps;Supine   PT Diagnosis:    PT Problem List:   PT Treatment Interventions:     PT Goals (current goals can now be found in the care plan section) Acute Rehab PT Goals Patient Stated Goal: home on Friday PT Goal Formulation: With patient Time For Goal Achievement: 02/05/13 Potential to Achieve Goals: Good  Visit Information  Last PT Received On: 01/30/13 Assistance Needed: +1 History of Present Illness: 79 y.o. s/p LEFT TOTAL KNEE ARTHROPLASTY REVISION (Left); femoral component only    Subjective Data  Patient Stated Goal: home on Friday   Cognition  Cognition Arousal/Alertness: Awake/alert Behavior During Therapy: [facility] for tasks assessed/performed Overall Cognitive Status: Within Functional Limits for tasks assessed    Balance     End of Session PT - End of Session Equipment Utilized During Treatment: Gait belt Activity Tolerance: Patient tolerated treatment well Patient left: in chair;with call bell/phone within reach Nurse Communication: Mobility status   GP     [name] 01/30/2013, 1:24 PM Pager [phone]

## 2013-01-30 NOTE — Progress Notes (Signed)
Orthopedic Tech Progress Note Patient Details:  [name] 09/24/1933 [id number]  Patient ID: [name], female   DOB: 1933-08-20, 79 y.o.   MRN: [id number]  Placed pt's lle in cpm @ 0-70 degrees @1445  Nikki Dom 01/30/2013, 2:42 PM

## 2013-01-30 NOTE — Progress Notes (Signed)
Orthopedic Tech Progress Note Patient Details:  [name] [birth year] [id number]  Patient ID: [name], female   DOB: [birth year], 79 y.o.   MRN: [id number] Placed pt's lle in cpm @ 0-70 degrees @2015   Nikki Dom 01/30/2013, 8:14 PM

## 2013-01-31 LAB — CBC
HCT: 25.5 % — ABNORMAL LOW (ref 36.0–46.0)
Hemoglobin: 8.5 g/dL — ABNORMAL LOW (ref 12.0–15.0)
MCV: 89.5 fL (ref 78.0–100.0)
WBC: 10.3 10*3/uL (ref 4.0–10.5)

## 2013-01-31 MED ORDER — ASPIRIN 325 MG PO TBEC: 325.0000 mg | DELAYED_RELEASE_TABLET | Freq: Two times a day (BID) | ORAL | Status: DC

## 2013-01-31 MED ORDER — POLYETHYLENE GLYCOL 3350 17 G PO PACK: 17.0000 g | PACK | Freq: Every day | ORAL | Status: DC | PRN

## 2013-01-31 MED ORDER — OXYCODONE HCL ER 10 MG PO T12A: 10.0000 mg | EXTENDED_RELEASE_TABLET | Freq: Two times a day (BID) | ORAL | Status: DC

## 2013-01-31 MED ORDER — CELECOXIB 200 MG PO CAPS: 200.0000 mg | ORAL_CAPSULE | Freq: Two times a day (BID) | ORAL | Status: DC

## 2013-01-31 MED ORDER — OXYCODONE HCL 5 MG PO TABS: 5.0000 mg | ORAL_TABLET | ORAL | Status: DC | PRN

## 2013-01-31 MED ORDER — METHOCARBAMOL 500 MG PO TABS: 500.0000 mg | ORAL_TABLET | Freq: Four times a day (QID) | ORAL | Status: DC | PRN

## 2013-01-31 NOTE — Progress Notes (Signed)
Occupational Therapy Treatment Patient Details Name: [name] MRN: [id number] DOB: 15-Feb-1933 Today's Date: 01/31/2013 Time: 2952-8413 OT Time Calculation (min): 24 min  OT Assessment / Plan / Recommendation  History of present illness 79 y.o. s/p LEFT TOTAL KNEE ARTHROPLASTY REVISION (Left); femoral component only   OT comments  Pt doing well and is supposed to d/c home today. Will need a 3in1. Has assist by daughter PRN at d/c.   Follow Up Recommendations  Home health OT;Supervision/Assistance - 24 hour    Barriers to Discharge       Equipment Recommendations  3 in 1 bedside comode    Recommendations for Other Services    Frequency Min 2X/week   Progress towards OT Goals Progress towards OT goals: Progressing toward goals  Plan Discharge plan remains appropriate    Precautions / Restrictions Precautions Precautions: Knee;Fall Restrictions LLE Weight Bearing: Weight bearing as tolerated   Pertinent Vitals/Pain 3/10 L knee; rest, pt states had pain meds this am.    ADL  Grooming: Performed;Wash/dry hands;Supervision/safety Where Assessed - Grooming: Unsupported standing Lower Body Dressing: Performed;Min guard Where Assessed - Lower Body Dressing: Supported sit to Pharmacist, hospital: Performed;Min Psychologist, sport and exercise: Raised toilet seat with arms (or 3-in-1 over toilet) Toileting - Clothing Manipulation and Hygiene: Performed;Min guard Where Assessed - Engineer, mining and Hygiene: Sit to stand from 3-in-1 or toilet Equipment Used: Rolling walker ADL Comments: Educated on having walker in front of her when standing to pull up clothing for safety. Discussed sequence of LB dressing and pt did well with starting clothing over L foot. Pt states she will sponge bathe and agreeable to let HHOT assess tub transfer and she has a seat. Pt has assist by daugther PRN at d/c and discussed having her there when up initially.     OT Diagnosis:     OT Problem List:   OT Treatment Interventions:     OT Goals(current goals can now be found in the care plan section)    Visit Information  Last OT Received On: 01/31/13 Assistance Needed: +1 History of Present Illness: 79 y.o. s/p LEFT TOTAL KNEE ARTHROPLASTY REVISION (Left); femoral component only    Subjective Data      Prior Functioning       Cognition  Cognition Arousal/Alertness: Awake/alert Behavior During Therapy: WFL for tasks assessed/performed Overall Cognitive Status: Within Functional Limits for tasks assessed    Mobility  Bed Mobility Supine to Sit: 6: Modified independent (Device/Increase time);HOB elevated Transfers Transfers: Sit to Stand;Stand to Sit Sit to Stand: 5: Supervision;With upper extremity assist;From bed;From chair/3-in-1 Stand to Sit: 5: Supervision;With upper extremity assist;To chair/3-in-1 Details for Transfer Assistance: supervision needed for a verbal cue to reach back for chair X 1    Exercises      Balance Balance Balance Assessed: Yes Dynamic Standing Balance Dynamic Standing - Level of Assistance: 5: Stand by assistance   End of Session OT - End of Session Equipment Utilized During Treatment: Rolling walker Activity Tolerance: Patient tolerated treatment well Patient left: in chair (with PT)  GO     Lennox Laity [phone] 01/31/2013, 9:30 AM

## 2013-01-31 NOTE — Progress Notes (Signed)
Patient discharge home with daughter. Prescriptions given. DME ordered. [facility] for [facility].

## 2013-01-31 NOTE — Progress Notes (Signed)
Patient ID: [name], female   DOB: 1933/03/28, 79 y.o.   MRN: [id number] Subjective: 3 Days Post-Op Procedure(s) (LRB): LEFT TOTAL KNEE ARTHROPLASTY REVISION (Left) Awake, alert and oriented x 4. Patient reports pain as moderate.    Objective:   VITALS:  Temp:  [98 F (36.7 C)-99.8 F (37.7 C)] 98 F (36.7 C) (12/26 0444) Pulse Rate:  [60-88] 60 (12/26 0444) Resp:  [16-18] 16 (12/26 0444) BP: (110-126)/(60-96) 110/60 mmHg (12/26 0444) SpO2:  [97 %-99 %] 99 % (12/26 0444)  Neurologically intact ABD soft Sensation intact distally Intact pulses distally   LABS  Recent Labs  01/29/13 0400 01/30/13 0756 01/31/13 0500  HGB 9.8* 9.5* 8.5*  WBC 9.4 10.7* PENDING  PLT 221 192 213    Recent Labs  01/29/13 0400  NA 138  K 4.1  CL 103  CO2 25  BUN 11  CREATININE 0.87  GLUCOSE 112*   No results found for this basename: LABPT, INR,  in the last 72 hours   Assessment/Plan: 3 Days Post-Op Procedure(s) (LRB): LEFT TOTAL KNEE ARTHROPLASTY REVISION (Left)  Advance diet Up with therapy Discharge home with home health  Kyriaki Moder E 01/31/2013, 7:52 AM

## 2013-01-31 NOTE — Progress Notes (Signed)
Physical Therapy Treatment Patient Details Name: [name] MRN: [id number] DOB: [birth year] Today's Date: 01/31/2013 Time: 8295-6213 PT Time Calculation (min): 23 min  PT Assessment / Plan / Recommendation  History of Present Illness 79 y.o. s/p LEFT TOTAL KNEE ARTHROPLASTY REVISION (Left); femoral component only   PT Comments   Pt progressing well and she is expecting to DC home this AM. Answered all questions related to mobility.    Follow Up Recommendations        Does the patient have the potential to tolerate intense rehabilitation     Barriers to Discharge        Equipment Recommendations       Recommendations for Other Services    Frequency     Progress towards PT Goals Progress towards PT goals: Progressing toward goals  Plan Current plan remains appropriate (anticipate DC home today with family to assist)    Precautions / Restrictions Precautions Precautions: Knee;Fall Restrictions Weight Bearing Restrictions: Yes LLE Weight Bearing: Weight bearing as tolerated   Pertinent Vitals/Pain 3/10 pain at rest. Pain increased with exercise.      Mobility  Bed Mobility Bed Mobility: Not assessed (Pt up in chair) Supine to Sit: 6: Modified independent (Device/Increase time);HOB elevated Transfers Transfers: Sit to Stand;Stand to Sit Sit to Stand: 6: Modified independent (Device/Increase time);From chair/3-in-1;With armrests Stand to Sit: 6: Modified independent (Device/Increase time);With armrests;To chair/3-in-1 Details for Transfer Assistance: no cues needed Ambulation/Gait Ambulation/Gait Assistance: 5: Supervision Ambulation Distance (Feet): 160 Feet Assistive device: Rolling walker Ambulation/Gait Assistance Details: generally kept proper sequencing Gait Pattern: Step-to pattern;Step-through pattern (varied between the two patterns)    Exercises Total Joint Exercises Ankle Circles/Pumps: AROM;Both;10 reps Hip ABduction/ADduction: AROM;Left;10  reps Long Arc Quad: Left;10 reps;Seated Knee Flexion: AAROM;Left;5 reps Goniometric ROM: 85 degrees flexion   PT Diagnosis:    PT Problem List:   PT Treatment Interventions:     PT Goals (current goals can now be found in the care plan section) Acute Rehab PT Goals Patient Stated Goal: Go home today  Visit Information  Last PT Received On: 01/31/13 Assistance Needed: +1 History of Present Illness: 79 y.o. s/p LEFT TOTAL KNEE ARTHROPLASTY REVISION (Left); femoral component only    Subjective Data  Patient Stated Goal: Go home today   Cognition  Cognition Arousal/Alertness: Awake/alert Behavior During Therapy: WFL for tasks assessed/performed Overall Cognitive Status: Within Functional Limits for tasks assessed    Balance  Balance Balance Assessed: Yes Dynamic Standing Balance Dynamic Standing - Level of Assistance: 5: Stand by assistance  End of Session PT - End of Session Activity Tolerance: Patient tolerated treatment well Patient left: in chair;with call bell/phone within reach Nurse Communication: Mobility status (Rn observed pt ambulating in hallways)   GP     Donnella Sham 01/31/2013, 9:54 AM Lavona Mound, PT  [phone] 01/31/2013

## 2013-02-03 NOTE — Discharge Summary (Signed)
Physician Discharge Summary  Patient ID: [name] MRN: [id number] DOB/AGE: 04-05-1933 79 y.o.  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 Diabetes mellitus   . Hypertension   . Atrial fibrillation     Diagnosed ~2009  . GERD (gastroesophageal reflux disease)   . Breast cancer, left breast     S/p chemo and mastectomy 1998  . Hyperlipidemia   . Left shoulder pain     "MRI showed pinched nerve" - per pt  . Arthritis     Surgeries: Procedure(s): LEFT TOTAL KNEE ARTHROPLASTY REVISION on 01/28/2013   Consultants (if any):    Discharged Condition: Improved  Hospital Course: [name] is an 79 y.o. female who was admitted 01/28/2013 with a diagnosis of aseptic loosening of left total knee replacement and went to the operating room on 01/28/2013 and underwent the above named procedures.    She was given perioperative antibiotics:      Anti-infectives   Start     Dose/Rate Route Frequency Ordered Stop   01/28/13 2300  ceFAZolin (ANCEF) IVPB 2 g/50 mL premix     2 g 100 mL/hr over 30 Minutes Intravenous Every 8 hours 01/28/13 1858 01/29/13 0649   01/28/13 1343  ceFAZolin (ANCEF) 2-3 GM-% IVPB SOLR    Comments:  Christell Constant, Terri   : cabinet override      01/28/13 1343 01/29/13 0159    .  She was given sequential compression devices, early ambulation, and aspirin for DVT prophylaxis.  She benefited maximally from the hospital stay and there were no complications.    Recent vital signs:  Filed Vitals:   01/31/13 1139  BP:   Pulse:   Temp:   Resp: 18    Recent laboratory studies:  Lab Results  Component Value Date   HGB 8.5* 01/31/2013   HGB 9.5* 01/30/2013   HGB 9.8* 01/29/2013   Lab Results  Component Value Date   WBC 10.3 01/31/2013   PLT 213  01/31/2013   Lab Results  Component Value Date   INR 1.13 01/22/2013   Lab Results  Component Value Date   NA 138 01/29/2013   K 4.1 01/29/2013   CL 103 01/29/2013   CO2 25 01/29/2013   BUN 11 01/29/2013   CREATININE 0.87 01/29/2013   GLUCOSE 112* 01/29/2013    Discharge Medications:     Medication List         albuterol 108 (90 BASE) MCG/ACT inhaler  Commonly known as:  PROVENTIL HFA;VENTOLIN HFA  Inhale 1 puff into the lungs every 6 (six) hours as needed for wheezing or shortness of breath.     aspirin EC 325 MG tablet  Take 1 tablet (325 mg total) by mouth 2 (two) times daily.     aspirin 325 MG EC tablet  Take 1 tablet (325 mg total) by mouth 2 (two) times daily.     CARDIZEM CD 120 MG 24 hr capsule  Generic drug:  diltiazem  Take 120 mg by mouth daily.     celecoxib 200 MG capsule  Commonly known as:  CELEBREX  Take 1 capsule (200 mg total) by mouth every 12 (twelve) hours.     HYDROcodone-acetaminophen 5-325 MG per tablet  Commonly known as:  NORCO/VICODIN  Take 2 tablets by mouth every 4 (four) hours  as needed for pain.     methocarbamol 500 MG tablet  Commonly known as:  ROBAXIN  Take 1 tablet (500 mg total) by mouth every 6 (six) hours as needed for muscle spasms.     metoprolol 50 MG tablet  Commonly known as:  LOPRESSOR  Take 50 mg by mouth 2 (two) times daily.     oxyCODONE 5 MG immediate release tablet  Commonly known as:  Oxy IR/ROXICODONE  Take 1-3 tablets (5-15 mg total) by mouth every 4 (four) hours as needed.     oxyCODONE 5 MG immediate release tablet  Commonly known as:  Oxy IR/ROXICODONE  Take 1 tablet (5 mg total) by mouth every 3 (three) hours as needed for breakthrough pain.     OxyCODONE 10 mg T12a 12 hr tablet  Commonly known as:  OXYCONTIN  Take 1 tablet (10 mg total) by mouth every 12 (twelve) hours.     polyethylene glycol packet  Commonly known as:  MIRALAX / GLYCOLAX  Take 17 g by mouth daily as needed for mild  constipation.     VITAMIN D PO  Take 1 capsule by mouth daily.        Diagnostic Studies: Dg Knee Left Port  01/28/2013   CLINICAL DATA:  Knee arthroplasty.  EXAM: PORTABLE LEFT KNEE - 1-2 VIEW  COMPARISON:  11/16/2012.  FINDINGS: Revision left total knee arthroplasty. Large areas of bone cement are present in the distal femur associated with the revision. It is unclear whether tibial tray revision has been performed. Expected postsurgical changes in the soft tissues. No complicating features are identified.  IMPRESSION: Revision left total knee arthroplasty.   Electronically Signed   By: Andreas Newport M.D.   On: 01/28/2013 19:02    Disposition: 01-Home or Self Care  Discharge Orders   Future Orders Complete By Expires   Call MD / Call 911  As directed    Comments:     If you experience chest pain or shortness of breath, CALL 911 and be transported to the hospital emergency room.  If you develope a fever above 101 F, pus (white drainage) or increased drainage or redness at the wound, or calf pain, call your surgeon's office.   Change dressing  As directed    Comments:     Change dressing on 12/27, then change the dressing daily with sterile 4 x 4 inch gauze dressing and apply TED hose.  You may clean the incision with alcohol prior to redressing.   Constipation Prevention  As directed    Comments:     Drink plenty of fluids.  Prune juice may be helpful.  You may use a stool softener, such as Colace (over the counter) 100 mg twice a day.  Use MiraLax (over the counter) for constipation as needed.   CPM  As directed    Comments:     Continuous passive motion machine (CPM):      Use the CPM from 0 to 60 for 8 hours per day.      You may increase by 10 degrees flexion per day.  You may break it up into 2 or 3 sessions per day.      Use CPM for 3-4 weeks or until you are told to stop.   Diet - low sodium heart healthy  As directed    Discharge instructions  As directed    Comments:       Keep knee incision dry for 5 days post op  then may wet while bathing. Therapy daily and CPM goal full extension and greater than 90 degrees flexion. Call if fever or chills or increased drainage. Go to ER if acutely short of breath or call for ambulance. Return for follow up in 2 weeks. May full weight bear on the surgical leg unless told otherwise. Use knee immobilizer until able to straight leg raise off bed with knee stable. In house walking for first 2 weeks.   Do not put a pillow under the knee. Place it under the heel.  As directed    Driving restrictions  As directed    Comments:     No driving for 6 weeks   Increase activity slowly as tolerated  As directed    Lifting restrictions  As directed    Comments:     No lifting for 6 weeks   Weight bearing as tolerated  As directed    Questions:     Laterality:     Extremity:        Follow-up Information   Follow up with Cheral Almas, MD In 2 weeks.   Specialty:  Orthopedic Surgery   Contact information:   [address] [place] [address] [phone]       Follow up In 2 weeks.       Signed: Cheral Almas 02/03/2013, 11:32 AM

## 2013-02-04 ENCOUNTER — Encounter (HOSPITAL_COMMUNITY): Payer: Self-pay | Admitting: Orthopaedic Surgery

## 2013-02-24 ENCOUNTER — Ambulatory Visit: Payer: Medicare Other | Attending: Orthopaedic Surgery | Admitting: Rehabilitation

## 2013-02-24 DIAGNOSIS — M25669 Stiffness of unspecified knee, not elsewhere classified: Secondary | ICD-10-CM | POA: Insufficient documentation

## 2013-02-24 DIAGNOSIS — Z96659 Presence of unspecified artificial knee joint: Secondary | ICD-10-CM | POA: Insufficient documentation

## 2013-02-24 DIAGNOSIS — IMO0001 Reserved for inherently not codable concepts without codable children: Secondary | ICD-10-CM | POA: Insufficient documentation

## 2013-02-24 DIAGNOSIS — I10 Essential (primary) hypertension: Secondary | ICD-10-CM | POA: Insufficient documentation

## 2013-02-24 DIAGNOSIS — J45909 Unspecified asthma, uncomplicated: Secondary | ICD-10-CM | POA: Insufficient documentation

## 2013-02-24 DIAGNOSIS — M25569 Pain in unspecified knee: Secondary | ICD-10-CM | POA: Insufficient documentation

## 2013-02-24 DIAGNOSIS — I4891 Unspecified atrial fibrillation: Secondary | ICD-10-CM | POA: Insufficient documentation

## 2013-02-26 ENCOUNTER — Ambulatory Visit: Payer: Medicare Other | Admitting: Rehabilitation

## 2013-02-28 ENCOUNTER — Emergency Department (HOSPITAL_BASED_OUTPATIENT_CLINIC_OR_DEPARTMENT_OTHER): Payer: Medicare Other

## 2013-02-28 ENCOUNTER — Encounter (HOSPITAL_BASED_OUTPATIENT_CLINIC_OR_DEPARTMENT_OTHER): Payer: Self-pay | Admitting: Emergency Medicine

## 2013-02-28 ENCOUNTER — Emergency Department (HOSPITAL_BASED_OUTPATIENT_CLINIC_OR_DEPARTMENT_OTHER)
Admission: EM | Admit: 2013-02-28 | Discharge: 2013-03-01 | Disposition: A | Discharge status: Other Acute Inpatient Hospital | Payer: Medicare Other | Attending: Emergency Medicine | Admitting: Emergency Medicine

## 2013-02-28 DIAGNOSIS — R143 Flatulence: Secondary | ICD-10-CM

## 2013-02-28 DIAGNOSIS — R142 Eructation: Secondary | ICD-10-CM | POA: Insufficient documentation

## 2013-02-28 DIAGNOSIS — R110 Nausea: Secondary | ICD-10-CM | POA: Insufficient documentation

## 2013-02-28 DIAGNOSIS — R141 Gas pain: Secondary | ICD-10-CM | POA: Insufficient documentation

## 2013-02-28 DIAGNOSIS — Z7901 Long term (current) use of anticoagulants: Secondary | ICD-10-CM | POA: Insufficient documentation

## 2013-02-28 DIAGNOSIS — R05 Cough: Secondary | ICD-10-CM | POA: Insufficient documentation

## 2013-02-28 DIAGNOSIS — E785 Hyperlipidemia, unspecified: Secondary | ICD-10-CM | POA: Insufficient documentation

## 2013-02-28 DIAGNOSIS — Z853 Personal history of malignant neoplasm of breast: Secondary | ICD-10-CM | POA: Insufficient documentation

## 2013-02-28 DIAGNOSIS — E119 Type 2 diabetes mellitus without complications: Secondary | ICD-10-CM | POA: Insufficient documentation

## 2013-02-28 DIAGNOSIS — Z7982 Long term (current) use of aspirin: Secondary | ICD-10-CM | POA: Insufficient documentation

## 2013-02-28 DIAGNOSIS — Z791 Long term (current) use of non-steroidal anti-inflammatories (NSAID): Secondary | ICD-10-CM | POA: Insufficient documentation

## 2013-02-28 DIAGNOSIS — K219 Gastro-esophageal reflux disease without esophagitis: Secondary | ICD-10-CM | POA: Insufficient documentation

## 2013-02-28 DIAGNOSIS — R0989 Other specified symptoms and signs involving the circulatory and respiratory systems: Principal | ICD-10-CM | POA: Insufficient documentation

## 2013-02-28 DIAGNOSIS — R059 Cough, unspecified: Secondary | ICD-10-CM | POA: Insufficient documentation

## 2013-02-28 DIAGNOSIS — I10 Essential (primary) hypertension: Secondary | ICD-10-CM | POA: Insufficient documentation

## 2013-02-28 DIAGNOSIS — R5381 Other malaise: Secondary | ICD-10-CM | POA: Insufficient documentation

## 2013-02-28 DIAGNOSIS — R0609 Other forms of dyspnea: Secondary | ICD-10-CM | POA: Insufficient documentation

## 2013-02-28 DIAGNOSIS — R0600 Dyspnea, unspecified: Secondary | ICD-10-CM

## 2013-02-28 DIAGNOSIS — M129 Arthropathy, unspecified: Secondary | ICD-10-CM | POA: Insufficient documentation

## 2013-02-28 DIAGNOSIS — I4891 Unspecified atrial fibrillation: Secondary | ICD-10-CM | POA: Insufficient documentation

## 2013-02-28 DIAGNOSIS — R5383 Other fatigue: Secondary | ICD-10-CM

## 2013-02-28 DIAGNOSIS — Z79899 Other long term (current) drug therapy: Secondary | ICD-10-CM | POA: Insufficient documentation

## 2013-02-28 DIAGNOSIS — Z96659 Presence of unspecified artificial knee joint: Secondary | ICD-10-CM | POA: Insufficient documentation

## 2013-02-28 DIAGNOSIS — J3489 Other specified disorders of nose and nasal sinuses: Secondary | ICD-10-CM | POA: Insufficient documentation

## 2013-02-28 LAB — URINALYSIS, ROUTINE W REFLEX MICROSCOPIC
Bilirubin Urine: NEGATIVE
GLUCOSE, UA: NEGATIVE mg/dL
Hgb urine dipstick: NEGATIVE
KETONES UR: NEGATIVE mg/dL
LEUKOCYTES UA: NEGATIVE
NITRITE: NEGATIVE
Protein, ur: >300 mg/dL — AB
SPECIFIC GRAVITY, URINE: 1.012 (ref 1.005–1.030)
Urobilinogen, UA: 1 mg/dL (ref 0.0–1.0)
pH: 7.5 (ref 5.0–8.0)

## 2013-02-28 LAB — CBC WITH DIFFERENTIAL/PLATELET
Basophils Absolute: 0 10*3/uL (ref 0.0–0.1)
Basophils Relative: 0 % (ref 0–1)
Eosinophils Absolute: 0.4 10*3/uL (ref 0.0–0.7)
Eosinophils Relative: 5 % (ref 0–5)
HCT: 30.2 % — ABNORMAL LOW (ref 36.0–46.0)
Hemoglobin: 9.6 g/dL — ABNORMAL LOW (ref 12.0–15.0)
LYMPHS ABS: 1.9 10*3/uL (ref 0.7–4.0)
LYMPHS PCT: 20 % (ref 12–46)
MCH: 29.4 pg (ref 26.0–34.0)
MCHC: 31.8 g/dL (ref 30.0–36.0)
MCV: 92.4 fL (ref 78.0–100.0)
MONOS PCT: 6 % (ref 3–12)
Monocytes Absolute: 0.6 10*3/uL (ref 0.1–1.0)
NEUTROS PCT: 69 % (ref 43–77)
Neutro Abs: 6.4 10*3/uL (ref 1.7–7.7)
Platelets: 236 10*3/uL (ref 150–400)
RBC: 3.27 MIL/uL — AB (ref 3.87–5.11)
RDW: 13.8 % (ref 11.5–15.5)
WBC: 9.3 10*3/uL (ref 4.0–10.5)

## 2013-02-28 LAB — COMPREHENSIVE METABOLIC PANEL
ALT: 10 U/L (ref 0–35)
AST: 23 U/L (ref 0–37)
Albumin: 3.8 g/dL (ref 3.5–5.2)
Alkaline Phosphatase: 103 U/L (ref 39–117)
BILIRUBIN TOTAL: 0.5 mg/dL (ref 0.3–1.2)
BUN: 8 mg/dL (ref 6–23)
CO2: 31 mEq/L (ref 19–32)
Calcium: 8.7 mg/dL (ref 8.4–10.5)
Chloride: 101 mEq/L (ref 96–112)
Creatinine, Ser: 0.9 mg/dL (ref 0.50–1.10)
GFR calc Af Amer: 70 mL/min — ABNORMAL LOW (ref >90)
GFR calc non Af Amer: 60 mL/min — ABNORMAL LOW (ref >90)
Glucose, Bld: 99 mg/dL (ref 70–99)
Potassium: 3.6 mEq/L — ABNORMAL LOW (ref 3.7–5.3)
Sodium: 144 mEq/L (ref 137–147)
Total Protein: 7.4 g/dL (ref 6.0–8.3)

## 2013-02-28 LAB — PROTIME-INR
INR: 1.24 (ref 0.00–1.49)
PROTHROMBIN TIME: 15.3 s — AB (ref 11.6–15.2)

## 2013-02-28 LAB — PRO B NATRIURETIC PEPTIDE: Pro B Natriuretic peptide (BNP): 4572 pg/mL — ABNORMAL HIGH (ref 0–450)

## 2013-02-28 LAB — URINE MICROSCOPIC-ADD ON

## 2013-02-28 LAB — TROPONIN I

## 2013-02-28 MED ORDER — IOHEXOL 350 MG/ML SOLN
100.0000 mL | Freq: Once | INTRAVENOUS | Status: AC | PRN
  Administered 2013-02-28: 100 mL via INTRAVENOUS

## 2013-02-28 NOTE — ED Notes (Signed)
Cough and SOB that started today.  Chest and back pain.

## 2013-02-28 NOTE — ED Notes (Signed)
Patient transported to X-ray 

## 2013-02-28 NOTE — ED Provider Notes (Signed)
CSN: [id number]     Arrival date & time 02/28/13  2102 History   First MD Initiated Contact with Patient 02/28/13 2115     Chief Complaint  Patient presents with  . Shortness of Breath   (Consider location/radiation/quality/duration/timing/severity/associated sxs/prior Treatment) Patient is a 80 y.o. female presenting with shortness of breath. The history is provided by the patient. No language interpreter was used.  Shortness of Breath Severity:  Mild Onset quality:  Gradual Timing:  Constant Associated symptoms: cough   Associated symptoms: no chest pain, no fever and no vomiting   Associated symptoms comment:  The patient is a difficulty historian. She states she has chest fullness "like there's something I need to cough up but can't". She has had abdominal bloating and nausea without vomiting. No fever. She continues to eat and drink per her normal. She reports normal bowel movement yesterday. She states that the shortness of breath is worse with activity or lying flat but is minimal at present, sitting up in the bed. She has a history of a-fib on chronic anticoagulation, and also has history of knee replacement surgery 4 weeks ago without post-surgical complication.   Past Medical History  Diagnosis Date  . Diabetes mellitus   . Hypertension   . Atrial fibrillation     Diagnosed ~2009  . GERD (gastroesophageal reflux disease)   . Breast cancer, left breast     S/p chemo and mastectomy 1998  . Hyperlipidemia   . Left shoulder pain     "MRI showed pinched nerve" - per pt  . Arthritis    Past Surgical History  Procedure Laterality Date  . Cholecystectomy    . Knee surgery      left  . Mastectomy      left, 1998  . Joint replacement Left 2001  . Eye surgery Bilateral     cataracts  . Total knee revision Left 01/28/2013    Procedure: LEFT TOTAL KNEE ARTHROPLASTY REVISION;  Surgeon: Marianna Payment, MD;  Location: [facility];  Service: Orthopedics;  Laterality: Left;    Family History  Problem Relation Age of Onset  . Heart attack Mother     73s   History  Substance Use Topics  . Smoking status: Never Smoker   . Smokeless tobacco: Current User    Types: Chew  . Alcohol Use: No   OB History   Grav Para Term Preterm Abortions TAB SAB Ect Mult Living                 Review of Systems  Constitutional: Negative for fever and chills.  HENT: Positive for congestion.   Respiratory: Positive for cough and shortness of breath.   Cardiovascular: Negative.  Negative for chest pain.  Gastrointestinal: Positive for nausea and abdominal distention. Negative for vomiting.  Genitourinary: Negative.  Negative for dysuria.  Skin: Negative.   Neurological: Positive for weakness.  Psychiatric/Behavioral: Negative for confusion.    Allergies  Review of patient's allergies indicates no known allergies.  Home Medications   Current Outpatient Rx  Name  Route  Sig  Dispense  Refill  . pregabalin (LYRICA) 75 MG capsule   Oral   Take 75 mg by mouth daily.         Marland Kitchen albuterol (PROVENTIL HFA;VENTOLIN HFA) 108 (90 BASE) MCG/ACT inhaler   Inhalation   Inhale 1 puff into the lungs every 6 (six) hours as needed for wheezing or shortness of breath.         Marland Kitchen  aspirin EC 325 MG EC tablet   Oral   Take 1 tablet (325 mg total) by mouth 2 (two) times daily.   100 tablet   0   . aspirin EC 325 MG tablet   Oral   Take 1 tablet (325 mg total) by mouth 2 (two) times daily.   84 tablet   0   . celecoxib (CELEBREX) 200 MG capsule   Oral   Take 1 capsule (200 mg total) by mouth every 12 (twelve) hours.   60 capsule   0   . Cholecalciferol (VITAMIN D PO)   Oral   Take 1 capsule by mouth daily.         Marland Kitchen diltiazem (CARDIZEM CD) 120 MG 24 hr capsule   Oral   Take 120 mg by mouth daily.         Marland Kitchen HYDROcodone-acetaminophen (NORCO/VICODIN) 5-325 MG per tablet   Oral   Take 2 tablets by mouth every 4 (four) hours as needed for pain.   16 tablet   0    . methocarbamol (ROBAXIN) 500 MG tablet   Oral   Take 1 tablet (500 mg total) by mouth every 6 (six) hours as needed for muscle spasms.   40 tablet   1   . metoprolol (LOPRESSOR) 50 MG tablet   Oral   Take 50 mg by mouth 2 (two) times daily.           Marland Kitchen oxyCODONE (OXY IR/ROXICODONE) 5 MG immediate release tablet   Oral   Take 1-3 tablets (5-15 mg total) by mouth every 4 (four) hours as needed.   90 tablet   0   . oxyCODONE (OXY IR/ROXICODONE) 5 MG immediate release tablet   Oral   Take 1 tablet (5 mg total) by mouth every 3 (three) hours as needed for breakthrough pain.   50 tablet   0   . OxyCODONE (OXYCONTIN) 10 mg T12A 12 hr tablet   Oral   Take 1 tablet (10 mg total) by mouth every 12 (twelve) hours.   30 tablet   0     Dispense as written.   . polyethylene glycol (MIRALAX / GLYCOLAX) packet   Oral   Take 17 g by mouth daily as needed for mild constipation.   14 each   0    BP 175/99  Pulse 74  Temp(Src) 98.8 F (37.1 C) (Oral)  Resp 16  Ht 5\' 4"  (1.626 m)  Wt 156 lb (70.761 kg)  BMI 26.76 kg/m2  SpO2 100% Physical Exam  Constitutional: She is oriented to person, place, and time. She appears well-developed and well-nourished.  HENT:  Head: Normocephalic.  Neck: Normal range of motion. Neck supple.  Cardiovascular: Normal rate.  An irregularly irregular rhythm present.  No murmur heard. Pulmonary/Chest: Effort normal. She has no wheezes. She has rales. She exhibits no tenderness.  Abdominal: Soft. Bowel sounds are normal. There is no tenderness. There is no rebound and no guarding.  Musculoskeletal: Normal range of motion. She exhibits no edema.  Left knee swollen, non-tender.   Neurological: She is alert and oriented to person, place, and time. No cranial nerve deficit.  Skin: Skin is warm and dry. No rash noted.  Psychiatric: She has a normal mood and affect.    ED Course  Procedures (including critical care time) Labs Review Labs Reviewed -  No data to display Imaging Review No results found. Dg Chest 2 View  02/28/2013   CLINICAL DATA:  Shortness of breath  EXAM: CHEST  2 VIEW  COMPARISON:  07/31/2012  FINDINGS: There are surgical clips within the left axilla. Mild cardiac enlargement. No pleural effusion or edema. No airspace consolidation. The visualized osseous structures are significant for mild thoracic spondylosis.  IMPRESSION: 1. No acute cardiopulmonary abnormalities. 2. Cardiac enlargement.   Electronically Signed   By: Kerby Moors M.D.   On: 02/28/2013 21:27   EKG Interpretation    Date/Time:  Friday February 28 2013 21:14:03 EST Ventricular Rate:  85 PR Interval:    QRS Duration: 86 QT Interval:  366 QTC Calculation: 435 R Axis:   41 Text Interpretation:  Atrial fibrillation with premature ventricular or aberrantly conducted complexes Septal infarct , age undetermined Abnormal ECG No significant change since last tracing Confirmed by POLLINA  MD, Hadar ([id number]) on 02/28/2013 9:17:37 PM            MDM  No diagnosis found. 1. Dyspnea  Patient care transferred to Dr. Betsey Holiday pending lab studies.     Dewaine Oats, PA-C 02/28/13 2150

## 2013-03-01 DIAGNOSIS — Z7982 Long term (current) use of aspirin: Secondary | ICD-10-CM | POA: Diagnosis not present

## 2013-03-01 DIAGNOSIS — Z791 Long term (current) use of non-steroidal anti-inflammatories (NSAID): Secondary | ICD-10-CM | POA: Diagnosis not present

## 2013-03-01 DIAGNOSIS — Z79899 Other long term (current) drug therapy: Secondary | ICD-10-CM | POA: Diagnosis not present

## 2013-03-01 DIAGNOSIS — R141 Gas pain: Secondary | ICD-10-CM | POA: Diagnosis not present

## 2013-03-01 DIAGNOSIS — K219 Gastro-esophageal reflux disease without esophagitis: Secondary | ICD-10-CM | POA: Diagnosis not present

## 2013-03-01 DIAGNOSIS — M129 Arthropathy, unspecified: Secondary | ICD-10-CM | POA: Diagnosis not present

## 2013-03-01 DIAGNOSIS — R05 Cough: Secondary | ICD-10-CM | POA: Diagnosis not present

## 2013-03-01 DIAGNOSIS — Z853 Personal history of malignant neoplasm of breast: Secondary | ICD-10-CM | POA: Diagnosis not present

## 2013-03-01 DIAGNOSIS — E785 Hyperlipidemia, unspecified: Secondary | ICD-10-CM | POA: Diagnosis not present

## 2013-03-01 DIAGNOSIS — Z7901 Long term (current) use of anticoagulants: Secondary | ICD-10-CM | POA: Diagnosis not present

## 2013-03-01 DIAGNOSIS — I4891 Unspecified atrial fibrillation: Secondary | ICD-10-CM | POA: Diagnosis not present

## 2013-03-01 DIAGNOSIS — R0609 Other forms of dyspnea: Secondary | ICD-10-CM | POA: Diagnosis not present

## 2013-03-01 DIAGNOSIS — R5383 Other fatigue: Secondary | ICD-10-CM | POA: Diagnosis not present

## 2013-03-01 DIAGNOSIS — E119 Type 2 diabetes mellitus without complications: Secondary | ICD-10-CM | POA: Diagnosis not present

## 2013-03-01 DIAGNOSIS — J3489 Other specified disorders of nose and nasal sinuses: Secondary | ICD-10-CM | POA: Diagnosis not present

## 2013-03-01 DIAGNOSIS — I10 Essential (primary) hypertension: Secondary | ICD-10-CM | POA: Diagnosis not present

## 2013-03-01 DIAGNOSIS — R5381 Other malaise: Secondary | ICD-10-CM | POA: Diagnosis not present

## 2013-03-01 DIAGNOSIS — R059 Cough, unspecified: Secondary | ICD-10-CM | POA: Diagnosis not present

## 2013-03-01 DIAGNOSIS — R0602 Shortness of breath: Secondary | ICD-10-CM | POA: Diagnosis present

## 2013-03-01 DIAGNOSIS — R110 Nausea: Secondary | ICD-10-CM | POA: Diagnosis not present

## 2013-03-01 DIAGNOSIS — Z96659 Presence of unspecified artificial knee joint: Secondary | ICD-10-CM | POA: Diagnosis not present

## 2013-03-01 MED ORDER — ASPIRIN 81 MG PO CHEW
324.0000 mg | CHEWABLE_TABLET | Freq: Once | ORAL | Status: AC
  Administered 2013-03-01: 324 mg via ORAL
  Filled 2013-03-01: qty 4

## 2013-03-01 MED ORDER — FUROSEMIDE 10 MG/ML IJ SOLN
40.0000 mg | Freq: Once | INTRAMUSCULAR | Status: AC
  Administered 2013-03-01: 40 mg via INTRAVENOUS
  Filled 2013-03-01: qty 4

## 2013-03-01 NOTE — ED Provider Notes (Signed)
Medical screening examination/treatment/procedure(s) were conducted as a shared visit with non-physician practitioner(s) and myself.  I personally evaluated the patient during the encounter.  EKG Interpretation    Date/Time:  Friday February 28 2013 21:14:03 EST Ventricular Rate:  85 PR Interval:    QRS Duration: 86 QT Interval:  366 QTC Calculation: 435 R Axis:   41 Text Interpretation:  Atrial fibrillation with premature ventricular or aberrantly conducted complexes Septal infarct , age undetermined Abnormal ECG No significant change since last tracing Confirmed by POLLINA  MD, Lomas ([id number]) on 02/28/2013 9:17:37 PM           Patient presents to ER for evaluation of fullness in her chest, difficulty breathing and a choking sensation in her throat. She is a difficult historian, has difficulty completely explaining her symptoms. She does have a history of paroxysmal atrial fibrillation. Patient underwent knee surgery 4 weeks ago. Because of this, PE was considered. CT angiography was performed to rule out PE. The patient's EKG did not show ischemic changes. Troponin negative. Remainder of the workup was unremarkable except for elevated BNP. Reviewing the patient's records reveal she did have an echo in 2013 which revealed ejection fraction of 45-50% with hypokinesis present. She is not currently treated for congestive heart failure, this would be a consideration. Patient will require hospitalization for cardiac rule out further evaluation for possible CHF.  Orpah Greek, MD 03/01/13 0030

## 2013-03-01 NOTE — ED Notes (Signed)
Pt placed on heart monitor.

## 2013-03-03 ENCOUNTER — Ambulatory Visit: Payer: Medicare Other | Admitting: Rehabilitation

## 2013-03-05 ENCOUNTER — Ambulatory Visit: Payer: Medicare Other | Admitting: Rehabilitation

## 2013-03-10 ENCOUNTER — Ambulatory Visit: Payer: Medicare Other | Attending: Orthopaedic Surgery | Admitting: Rehabilitation

## 2013-03-10 DIAGNOSIS — I4891 Unspecified atrial fibrillation: Secondary | ICD-10-CM | POA: Insufficient documentation

## 2013-03-10 DIAGNOSIS — M25569 Pain in unspecified knee: Secondary | ICD-10-CM | POA: Insufficient documentation

## 2013-03-10 DIAGNOSIS — I10 Essential (primary) hypertension: Secondary | ICD-10-CM | POA: Insufficient documentation

## 2013-03-10 DIAGNOSIS — IMO0001 Reserved for inherently not codable concepts without codable children: Secondary | ICD-10-CM | POA: Insufficient documentation

## 2013-03-10 DIAGNOSIS — M25669 Stiffness of unspecified knee, not elsewhere classified: Secondary | ICD-10-CM | POA: Insufficient documentation

## 2013-03-10 DIAGNOSIS — J45909 Unspecified asthma, uncomplicated: Secondary | ICD-10-CM | POA: Insufficient documentation

## 2013-03-10 DIAGNOSIS — Z96659 Presence of unspecified artificial knee joint: Secondary | ICD-10-CM | POA: Insufficient documentation

## 2013-03-12 ENCOUNTER — Ambulatory Visit: Payer: Medicare Other | Admitting: Rehabilitation

## 2013-03-17 ENCOUNTER — Ambulatory Visit: Payer: Medicare Other | Admitting: Rehabilitation

## 2013-03-19 ENCOUNTER — Ambulatory Visit: Payer: Medicare Other | Admitting: Rehabilitation

## 2013-06-28 ENCOUNTER — Emergency Department (HOSPITAL_BASED_OUTPATIENT_CLINIC_OR_DEPARTMENT_OTHER): Payer: Medicare Other

## 2013-06-28 ENCOUNTER — Encounter (HOSPITAL_BASED_OUTPATIENT_CLINIC_OR_DEPARTMENT_OTHER): Payer: Self-pay | Admitting: Emergency Medicine

## 2013-06-28 ENCOUNTER — Emergency Department (HOSPITAL_BASED_OUTPATIENT_CLINIC_OR_DEPARTMENT_OTHER)
Admission: EM | Admit: 2013-06-28 | Discharge: 2013-06-28 | Disposition: A | Discharge status: Home / Self Care | Payer: Medicare Other | Attending: Emergency Medicine | Admitting: Emergency Medicine

## 2013-06-28 DIAGNOSIS — Z8639 Personal history of other endocrine, nutritional and metabolic disease: Secondary | ICD-10-CM | POA: Insufficient documentation

## 2013-06-28 DIAGNOSIS — Z7982 Long term (current) use of aspirin: Secondary | ICD-10-CM | POA: Insufficient documentation

## 2013-06-28 DIAGNOSIS — E119 Type 2 diabetes mellitus without complications: Secondary | ICD-10-CM | POA: Insufficient documentation

## 2013-06-28 DIAGNOSIS — M7989 Other specified soft tissue disorders: Secondary | ICD-10-CM | POA: Insufficient documentation

## 2013-06-28 DIAGNOSIS — I4891 Unspecified atrial fibrillation: Secondary | ICD-10-CM | POA: Insufficient documentation

## 2013-06-28 DIAGNOSIS — Z862 Personal history of diseases of the blood and blood-forming organs and certain disorders involving the immune mechanism: Secondary | ICD-10-CM | POA: Insufficient documentation

## 2013-06-28 DIAGNOSIS — I10 Essential (primary) hypertension: Secondary | ICD-10-CM | POA: Insufficient documentation

## 2013-06-28 DIAGNOSIS — M129 Arthropathy, unspecified: Secondary | ICD-10-CM | POA: Insufficient documentation

## 2013-06-28 DIAGNOSIS — Z791 Long term (current) use of non-steroidal anti-inflammatories (NSAID): Secondary | ICD-10-CM | POA: Insufficient documentation

## 2013-06-28 DIAGNOSIS — I499 Cardiac arrhythmia, unspecified: Secondary | ICD-10-CM | POA: Insufficient documentation

## 2013-06-28 DIAGNOSIS — Z8719 Personal history of other diseases of the digestive system: Secondary | ICD-10-CM | POA: Insufficient documentation

## 2013-06-28 DIAGNOSIS — Z79899 Other long term (current) drug therapy: Secondary | ICD-10-CM | POA: Insufficient documentation

## 2013-06-28 DIAGNOSIS — Z853 Personal history of malignant neoplasm of breast: Secondary | ICD-10-CM | POA: Insufficient documentation

## 2013-06-28 MED ORDER — ACETAMINOPHEN 325 MG PO TABS: 650.0000 mg | ORAL_TABLET | Freq: Once | ORAL | Status: DC

## 2013-06-28 NOTE — Discharge Instructions (Signed)
Edema Edema is an abnormal build-up of fluids in tissues. Because this is partly dependent on gravity (water flows to the lowest place), it is more common in the legs and thighs (lower extremities). It is also common in the looser tissues, like around the eyes. Painless swelling of the feet and ankles is common and increases as a person ages. It may affect both legs and may include the calves or even thighs. When squeezed, the fluid may move out of the affected area and may leave a dent for a few moments. CAUSES   Prolonged standing or sitting in one place for extended periods of time. Movement helps pump tissue fluid into the veins, and absence of movement prevents this, resulting in edema.  Varicose veins. The valves in the veins do not work as well as they should. This causes fluid to leak into the tissues.  Fluid and salt overload.  Injury, burn, or surgery to the leg, ankle, or foot, may damage veins and allow fluid to leak out.  Sunburn damages vessels. Leaky vessels allow fluid to go out into the sunburned tissues.  Allergies (from insect bites or stings, medications or chemicals) cause swelling by allowing vessels to become leaky.  Protein in the blood helps keep fluid in your vessels. Low protein, as in malnutrition, allows fluid to leak out.  Hormonal changes, including pregnancy and menstruation, cause fluid retention. This fluid may leak out of vessels and cause edema.  Medications that cause fluid retention. Examples are sex hormones, blood pressure medications, steroid treatment, or anti-depressants.  Some illnesses cause edema, especially heart failure, kidney disease, or liver disease.  Surgery that cuts veins or lymph nodes, such as surgery done for the heart or for breast cancer, may result in edema. DIAGNOSIS  Your caregiver is usually easily able to determine what is causing your swelling (edema) by simply asking what is wrong (getting a history) and examining you (doing  a physical). Sometimes x-rays, EKG (electrocardiogram or heart tracing), and blood work may be done to evaluate for underlying medical illness. TREATMENT  General treatment includes:  Leg elevation (or elevation of the affected body part).  Restriction of fluid intake.  Prevention of fluid overload.  Compression of the affected body part. Compression with elastic bandages or support stockings squeezes the tissues, preventing fluid from entering and forcing it back into the blood vessels.  Diuretics (also called water pills or fluid pills) pull fluid out of your body in the form of increased urination. These are effective in reducing the swelling, but can have side effects and must be used only under your caregiver's supervision. Diuretics are appropriate only for some types of edema. The specific treatment can be directed at any underlying causes discovered. Heart, liver, or kidney disease should be treated appropriately. HOME CARE INSTRUCTIONS   Elevate the legs (or affected body part) above the level of the heart, while lying down.  Avoid sitting or standing still for prolonged periods of time.  Avoid putting anything directly under the knees when lying down, and do not wear constricting clothing or garters on the upper legs.  Exercising the legs causes the fluid to work back into the veins and lymphatic channels. This may help the swelling go down.  The pressure applied by elastic bandages or support stockings can help reduce ankle swelling.  A low-salt diet may help reduce fluid retention and decrease the ankle swelling.  Take any medications exactly as prescribed. SEEK MEDICAL CARE IF:  Your edema is   not responding to recommended treatments. SEEK IMMEDIATE MEDICAL CARE IF:   You develop shortness of breath or chest pain.  You cannot breathe when you lay down; or if, while lying down, you have to get up and go to the window to get your breath.  You are having increasing  swelling without relief from treatment.  You develop a fever over 102 F (38.9 C).  You develop pain or redness in the areas that are swollen.  Tell your caregiver right away if you have gained 03 lb/1.4 kg in 1 day or 05 lb/2.3 kg in a week. MAKE SURE YOU:   Understand these instructions.  Will watch your condition.  Will get help right away if you are not doing well or get worse. Document Released: 01/23/2005 Document Revised: 07/25/2011 Document Reviewed: 09/11/2007 [employer] Patient Information 2014 [employer].  

## 2013-06-28 NOTE — ED Provider Notes (Signed)
CSN: [id number]     Arrival date & time 06/28/13  1306 History   First MD Initiated Contact with Patient 06/28/13 1350     Chief Complaint  Patient presents with  . Leg Swelling     (Consider location/radiation/quality/duration/timing/severity/associated sxs/prior Treatment) Patient is a 80 y.o. female presenting with leg pain.  Leg Pain Location:  Leg Injury: no   Leg location:  L lower leg Pain details:    Quality:  Aching   Radiates to:  Does not radiate   Severity:  Severe   Onset quality:  Gradual   Duration:  3 days   Timing:  Constant   Progression:  Worsening Chronicity:  New Relieved by:  Nothing Exacerbated by: palpation. Ineffective treatments:  None tried Associated symptoms: swelling   Associated symptoms: no fever, no numbness and no tingling     Past Medical History  Diagnosis Date  . Diabetes mellitus   . Hypertension   . Atrial fibrillation     Diagnosed ~2009  . GERD (gastroesophageal reflux disease)   . Breast cancer, left breast     S/p chemo and mastectomy 1998  . Hyperlipidemia   . Left shoulder pain     "MRI showed pinched nerve" - per pt  . Arthritis    Past Surgical History  Procedure Laterality Date  . Cholecystectomy    . Knee surgery      left  . Mastectomy      left, 1998  . Joint replacement Left 2001  . Eye surgery Bilateral     cataracts  . Total knee revision Left 01/28/2013    Procedure: LEFT TOTAL KNEE ARTHROPLASTY REVISION;  Surgeon: Marianna Payment, MD;  Location: [facility];  Service: Orthopedics;  Laterality: Left;   Family History  Problem Relation Age of Onset  . Heart attack Mother     71s   History  Substance Use Topics  . Smoking status: Never Smoker   . Smokeless tobacco: Current User    Types: Chew  . Alcohol Use: No   OB History   Grav Para Term Preterm Abortions TAB SAB Ect Mult Living                 Review of Systems  Constitutional: Negative for fever.  Respiratory: Negative for cough and  shortness of breath.   Cardiovascular: Negative for chest pain.  Gastrointestinal: Negative for nausea, vomiting, abdominal pain and diarrhea.  All other systems reviewed and are negative.     Allergies  Review of patient's allergies indicates no known allergies.  Home Medications   Prior to Admission medications   Medication Sig Start Date End Date Taking? Authorizing Provider  albuterol (PROVENTIL HFA;VENTOLIN HFA) 108 (90 BASE) MCG/ACT inhaler Inhale 1 puff into the lungs every 6 (six) hours as needed for wheezing or shortness of breath.    Historical Provider, MD  aspirin EC 325 MG EC tablet Take 1 tablet (325 mg total) by mouth 2 (two) times daily. 01/31/13   Jessy Oto, MD  aspirin EC 325 MG tablet Take 1 tablet (325 mg total) by mouth 2 (two) times daily. 01/29/13   Naiping Eduard Roux, MD  celecoxib (CELEBREX) 200 MG capsule Take 1 capsule (200 mg total) by mouth every 12 (twelve) hours. 01/31/13   Jessy Oto, MD  Cholecalciferol (VITAMIN D PO) Take 1 capsule by mouth daily.    Historical Provider, MD  diltiazem (CARDIZEM CD) 120 MG 24 hr capsule Take 120 mg by  mouth daily.    Historical Provider, MD  HYDROcodone-acetaminophen (NORCO/VICODIN) 5-325 MG per tablet Take 2 tablets by mouth every 4 (four) hours as needed for pain. 11/16/12   Fransico Meadow, PA-C  methocarbamol (ROBAXIN) 500 MG tablet Take 1 tablet (500 mg total) by mouth every 6 (six) hours as needed for muscle spasms. 01/31/13   Jessy Oto, MD  metoprolol (LOPRESSOR) 50 MG tablet Take 50 mg by mouth 2 (two) times daily.      Historical Provider, MD  oxyCODONE (OXY IR/ROXICODONE) 5 MG immediate release tablet Take 1-3 tablets (5-15 mg total) by mouth every 4 (four) hours as needed. 01/29/13   Naiping Eduard Roux, MD  oxyCODONE (OXY IR/ROXICODONE) 5 MG immediate release tablet Take 1 tablet (5 mg total) by mouth every 3 (three) hours as needed for breakthrough pain. 01/31/13   Jessy Oto, MD  OxyCODONE  (OXYCONTIN) 10 mg T12A 12 hr tablet Take 1 tablet (10 mg total) by mouth every 12 (twelve) hours. 01/31/13   Jessy Oto, MD  polyethylene glycol [facility] / Floria Raveling) packet Take 17 g by mouth daily as needed for mild constipation. 01/31/13   Jessy Oto, MD  pregabalin (LYRICA) 75 MG capsule Take 75 mg by mouth daily.    Historical Provider, MD   BP 138/87  Pulse 89  Temp(Src) 98.4 F (36.9 C) (Oral)  Resp 20  Ht 5\' 4"  (1.626 m)  Wt 159 lb (72.122 kg)  BMI 27.28 kg/m2  SpO2 98% Physical Exam  Nursing note and vitals reviewed. Constitutional: She is oriented to person, place, and time. She appears well-developed and well-nourished. No distress.  HENT:  Head: Normocephalic and atraumatic.  Eyes: Conjunctivae are normal. No scleral icterus.  Neck: Neck supple.  Cardiovascular: Normal rate and intact distal pulses.  An irregularly irregular rhythm present.  Pulmonary/Chest: Effort normal. No stridor. No respiratory distress.  Abdominal: Normal appearance. She exhibits no distension.  Musculoskeletal: She exhibits edema (1+ pitting edema in left leg from knee down.  2+ distal pulses.  ) and tenderness (left calf).  Neurological: She is alert and oriented to person, place, and time.  Skin: Skin is warm and dry. No rash noted.  Psychiatric: She has a normal mood and affect. Her behavior is normal.    ED Course  Procedures (including critical care time) Labs Review Labs Reviewed - No data to display  Imaging Review US Venous Img Lower Unilateral Left  06/28/2013   CLINICAL DATA:  Calf pain and swelling  EXAM: LEFT LOWER EXTREMITY VENOUS DOPPLER ULTRASOUND  TECHNIQUE: Gray-scale sonography with compression, as well as color and duplex ultrasound, were performed to evaluate the deep venous system from the level of the common femoral vein through the popliteal and proximal calf veins.  COMPARISON:  None  FINDINGS: Normal compressibility of the common femoral, superficial femoral, and  popliteal veins, as well as the proximal calf veins. No filling defects to suggest DVT on grayscale or color Doppler imaging. Doppler waveforms show normal direction of venous flow, normal respiratory phasicity and response to augmentation. There is subcutaneous edema at the ankle level. Visualized segments of the saphenous venous system normal in caliber and compressibility. There is a complex 2.2 x 2.9 x 16.8 cm fluid collection extending from the posterior popliteal region into the mid calf.  IMPRESSION: 1. No evidence of  lower extremity deep vein thrombosis. 2. Probable complex or ruptured Baker's cyst extending to the mid calf.   Electronically Signed  By: Arne Cleveland M.D.   On: 06/28/2013 14:37     EKG Interpretation None      MDM   Final diagnoses:  Left leg swelling    [place] negative for DVT.  Pt otherwise well appearing with no additional complaints.  Plan dc with PCP follow up.      Houston Siren III, MD 06/28/13 [phone]

## 2013-06-28 NOTE — ED Notes (Signed)
Reports onset of left lower leg swelling Wednesday.  Reports pain in her calf area, no prior history of this problem.  Denies chest pain, SHOB, fever. C/o feeling like her leg is warm to touch.

## 2013-12-20 ENCOUNTER — Emergency Department (HOSPITAL_BASED_OUTPATIENT_CLINIC_OR_DEPARTMENT_OTHER): Payer: Medicare Other

## 2013-12-20 ENCOUNTER — Encounter (HOSPITAL_BASED_OUTPATIENT_CLINIC_OR_DEPARTMENT_OTHER): Payer: Self-pay

## 2013-12-20 ENCOUNTER — Emergency Department (HOSPITAL_BASED_OUTPATIENT_CLINIC_OR_DEPARTMENT_OTHER)
Admission: EM | Admit: 2013-12-20 | Discharge: 2013-12-20 | Disposition: A | Discharge status: Home / Self Care | Payer: Medicare Other | Attending: Emergency Medicine | Admitting: Emergency Medicine

## 2013-12-20 DIAGNOSIS — H5712 Ocular pain, left eye: Secondary | ICD-10-CM | POA: Diagnosis present

## 2013-12-20 DIAGNOSIS — K219 Gastro-esophageal reflux disease without esophagitis: Secondary | ICD-10-CM | POA: Insufficient documentation

## 2013-12-20 DIAGNOSIS — E119 Type 2 diabetes mellitus without complications: Secondary | ICD-10-CM | POA: Diagnosis not present

## 2013-12-20 DIAGNOSIS — I10 Essential (primary) hypertension: Secondary | ICD-10-CM | POA: Insufficient documentation

## 2013-12-20 DIAGNOSIS — G8929 Other chronic pain: Secondary | ICD-10-CM | POA: Diagnosis not present

## 2013-12-20 DIAGNOSIS — M542 Cervicalgia: Secondary | ICD-10-CM | POA: Diagnosis not present

## 2013-12-20 DIAGNOSIS — I4891 Unspecified atrial fibrillation: Secondary | ICD-10-CM | POA: Diagnosis not present

## 2013-12-20 DIAGNOSIS — R519 Headache, unspecified: Secondary | ICD-10-CM

## 2013-12-20 DIAGNOSIS — Z853 Personal history of malignant neoplasm of breast: Secondary | ICD-10-CM | POA: Insufficient documentation

## 2013-12-20 DIAGNOSIS — Z7982 Long term (current) use of aspirin: Secondary | ICD-10-CM | POA: Insufficient documentation

## 2013-12-20 DIAGNOSIS — R51 Headache: Secondary | ICD-10-CM | POA: Insufficient documentation

## 2013-12-20 DIAGNOSIS — R202 Paresthesia of skin: Secondary | ICD-10-CM | POA: Insufficient documentation

## 2013-12-20 DIAGNOSIS — Z9012 Acquired absence of left breast and nipple: Secondary | ICD-10-CM | POA: Insufficient documentation

## 2013-12-20 DIAGNOSIS — Z79899 Other long term (current) drug therapy: Secondary | ICD-10-CM | POA: Diagnosis not present

## 2013-12-20 DIAGNOSIS — M1990 Unspecified osteoarthritis, unspecified site: Secondary | ICD-10-CM | POA: Insufficient documentation

## 2013-12-20 DIAGNOSIS — Z791 Long term (current) use of non-steroidal anti-inflammatories (NSAID): Secondary | ICD-10-CM | POA: Diagnosis not present

## 2013-12-20 LAB — SEDIMENTATION RATE: Sed Rate: 30 mm/hr — ABNORMAL HIGH (ref 0–22)

## 2013-12-20 MED ORDER — ACETAMINOPHEN 500 MG PO TABS
1000.0000 mg | ORAL_TABLET | Freq: Once | ORAL | Status: AC
  Administered 2013-12-20: 1000 mg via ORAL
  Filled 2013-12-20: qty 2

## 2013-12-20 MED ORDER — TETRACAINE HCL 0.5 % OP SOLN
2.0000 [drp] | Freq: Once | OPHTHALMIC | Status: DC
  Filled 2013-12-20: qty 2

## 2013-12-20 NOTE — ED Provider Notes (Signed)
CSN: [id number]     Arrival date & time 12/20/13  1537 History  This chart was scribed for Cassandra Johns, MD by Peyton Bottoms, ED Scribe. This patient was seen in room MH01/MH01 and the patient's care was started at 4:30 PM.   Chief Complaint  Patient presents with  . Eye Pain   Patient is a 80 y.o. female presenting with eye pain. The history is provided by the patient. No language interpreter was used.  Eye Pain Associated symptoms include headaches. Pertinent negatives include no chest pain, no abdominal pain and no shortness of breath.   HPI Comments: [name] is a 80 y.o. female who presents to the Emergency Department complaining of constant left sided head pain that waxes and wanes with associated intermittent left eye pain for the past 2 weeks. She currently denies any eye pain.  She currently denies any vision changes. She denies fevers.  She has chronic neck pain unchanged from baseline.  She denies recent head trauma.  No pain with chewing/eating.  No n/v.  No current numbness or weakness in there extremities.  She reports some intermittent tingling in her hands and feet only at night which is unchanged over the last several months.  She says that her balance is not great but no new or recent change in her gait.  No speech deficits.  No hx of similar headaches.  Past Medical History  Diagnosis Date  . Diabetes mellitus   . Hypertension   . Atrial fibrillation     Diagnosed ~2009  . GERD (gastroesophageal reflux disease)   . Breast cancer, left breast     S/p chemo and mastectomy 1998  . Hyperlipidemia   . Left shoulder pain     "MRI showed pinched nerve" - per pt  . Arthritis    Past Surgical History  Procedure Laterality Date  . Cholecystectomy    . Knee surgery      left  . Mastectomy      left, 1998  . Joint replacement Left 2001  . Eye surgery Bilateral     cataracts  . Total knee revision Left 01/28/2013    Procedure: LEFT TOTAL KNEE ARTHROPLASTY  REVISION;  Surgeon: Marianna Payment, MD;  Location: [facility];  Service: Orthopedics;  Laterality: Left;   Family History  Problem Relation Age of Onset  . Heart attack Mother     46s   History  Substance Use Topics  . Smoking status: Never Smoker   . Smokeless tobacco: Current User    Types: Chew  . Alcohol Use: No   OB History    No data available     Review of Systems  Constitutional: Negative for fever, chills, diaphoresis and fatigue.  HENT: Negative for congestion, rhinorrhea and sneezing.   Eyes: Positive for pain.  Respiratory: Negative for cough, chest tightness and shortness of breath.   Cardiovascular: Negative for chest pain and leg swelling.  Gastrointestinal: Negative for nausea, vomiting, abdominal pain, diarrhea and blood in stool.  Genitourinary: Negative for frequency, hematuria, flank pain and difficulty urinating.  Musculoskeletal: Negative for back pain and arthralgias.  Skin: Negative for rash.  Neurological: Positive for headaches. Negative for dizziness, speech difficulty, weakness and numbness.   Allergies  Review of patient's allergies indicates no known allergies.  Home Medications   Prior to Admission medications   Medication Sig Start Date End Date Taking? Authorizing Provider  albuterol (PROVENTIL HFA;VENTOLIN HFA) 108 (90 BASE) MCG/ACT inhaler Inhale 1 puff  into the lungs every 6 (six) hours as needed for wheezing or shortness of breath.    Historical Provider, MD  aspirin EC 325 MG EC tablet Take 1 tablet (325 mg total) by mouth 2 (two) times daily. 01/31/13   Jessy Oto, MD  aspirin EC 325 MG tablet Take 1 tablet (325 mg total) by mouth 2 (two) times daily. 01/29/13   Naiping Eduard Roux, MD  celecoxib (CELEBREX) 200 MG capsule Take 1 capsule (200 mg total) by mouth every 12 (twelve) hours. 01/31/13   Jessy Oto, MD  Cholecalciferol (VITAMIN D PO) Take 1 capsule by mouth daily.    Historical Provider, MD  diltiazem (CARDIZEM CD) 120 MG 24  hr capsule Take 120 mg by mouth daily.    Historical Provider, MD  HYDROcodone-acetaminophen (NORCO/VICODIN) 5-325 MG per tablet Take 2 tablets by mouth every 4 (four) hours as needed for pain. 11/16/12   Fransico Meadow, PA-C  methocarbamol (ROBAXIN) 500 MG tablet Take 1 tablet (500 mg total) by mouth every 6 (six) hours as needed for muscle spasms. 01/31/13   Jessy Oto, MD  metoprolol (LOPRESSOR) 50 MG tablet Take 50 mg by mouth 2 (two) times daily.      Historical Provider, MD  oxyCODONE (OXY IR/ROXICODONE) 5 MG immediate release tablet Take 1-3 tablets (5-15 mg total) by mouth every 4 (four) hours as needed. 01/29/13   Naiping Eduard Roux, MD  oxyCODONE (OXY IR/ROXICODONE) 5 MG immediate release tablet Take 1 tablet (5 mg total) by mouth every 3 (three) hours as needed for breakthrough pain. 01/31/13   Jessy Oto, MD  OxyCODONE (OXYCONTIN) 10 mg T12A 12 hr tablet Take 1 tablet (10 mg total) by mouth every 12 (twelve) hours. 01/31/13   Jessy Oto, MD  polyethylene glycol [facility] Floria Raveling) packet Take 17 g by mouth daily as needed for mild constipation. 01/31/13   Jessy Oto, MD  pregabalin (LYRICA) 75 MG capsule Take 75 mg by mouth daily.    Historical Provider, MD   Triage Vitals: BP 154/90 mmHg  Pulse 56  Temp(Src) 98.1 F (36.7 C) (Oral)  Resp 21  Ht _0  (1.626 m)  Wt 156 lb (70.761 kg)  BMI 26.76 kg/m2  SpO2 98%  Physical Exam  Constitutional: She is oriented to person, place, and time. She appears well-developed and well-nourished.  HENT:  Head: Normocephalic and atraumatic.  No pain over temporal artery  Eyes: Conjunctivae are normal. Pupils are equal, round, and reactive to light.  No swelling, erythema to the eye. No pain upon palpation around eye.  Eye pressures 24 left eye, 22 right eye.  Neck: Normal range of motion. Neck supple.  Cardiovascular: Normal rate, regular rhythm and normal heart sounds.   Pulmonary/Chest: Effort normal and breath sounds normal.  No respiratory distress. She has no wheezes. She has no rales. She exhibits no tenderness.  Abdominal: Soft. Bowel sounds are normal. There is no tenderness. There is no rebound and no guarding.  Musculoskeletal: Normal range of motion. She exhibits no edema.  Lymphadenopathy:    She has no cervical adenopathy.  Neurological: She is alert and oriented to person, place, and time.  motor 5/5 all extremities. Sensation intact to light touch all extremities. Cranial nerves 2-12 intact. No pronator drift. Gait intact.   Skin: Skin is warm and dry. No rash noted.  Psychiatric: She has a normal mood and affect.  Nursing note and vitals reviewed.  ED Course  Procedures (  including critical care time)  DIAGNOSTIC STUDIES: Oxygen Saturation is 98% on RA, normal by my interpretation.    COORDINATION OF CARE: 4:34 PM- Discussed plans to order diagnostic CT of head, and lab work. Will give patient pontocaine. Pt advised of plan for treatment and pt agrees.  Labs Review Labs Reviewed  SEDIMENTATION RATE - Abnormal; Notable for the following:    Sed Rate 30 (*)    All other components within normal limits    Imaging Review Ct Head Wo Contrast  12/20/2013   CLINICAL DATA:  Periodic headache for 2 weeks. History of breast cancer 1998.  EXAM: CT HEAD WITHOUT CONTRAST  TECHNIQUE: Contiguous axial images were obtained from the base of the skull through the vertex without intravenous contrast.  COMPARISON:  Head CT 07/21/2007  FINDINGS:  No acute intracranial abnormality is identified. Specifically, no intra or extra-axial hemorrhage, hydrocephalus, mass effect, mass lesion, or evidence of acute cortically based infarction. Physiologic bilateral basal ganglia calcifications are unchanged.  The skull is intact. The visualized paranasal sinuses and mastoid air cells are clear.  IMPRESSION: No acute intracranial abnormality.   Electronically Signed   By: Curlene Dolphin M.D.   On: 12/20/2013 18:05     EKG  Interpretation None     MDM   Final diagnoses:  Headache    Pt well appearing.  No vision deficits.  Eye pressures mildly elevated, but equal in both eyes and no other suggestions of glaucoma.  Pt only has mild h/a now.  No ICH.  No stroke symptoms. ESR mildy elevated but without pain with chewing, vision changes, pain directly over temporal artery, doubt temporal arteritis.  Advised pt to have recheck of her eye pressures by her eye doctor in [place] this week and to have f/u with her PMD regarding her headaches.  Return here if symptoms worsen.  I personally performed the services described in this documentation, which was scribed in my presence. The recorded information has been reviewed and is accurate.  Cassandra Johns, MD 12/20/13 [phone]

## 2013-12-20 NOTE — Discharge Instructions (Signed)

## 2013-12-20 NOTE — ED Notes (Addendum)
Pt reports 2 week history of left eye pain with left sided headache. Pt does not have home medication list with her.

## 2013-12-20 NOTE — ED Notes (Signed)
Pt discharged to home with family. NAD.  

## 2014-12-03 ENCOUNTER — Encounter (HOSPITAL_BASED_OUTPATIENT_CLINIC_OR_DEPARTMENT_OTHER): Payer: Self-pay | Admitting: *Deleted

## 2014-12-03 ENCOUNTER — Emergency Department (HOSPITAL_BASED_OUTPATIENT_CLINIC_OR_DEPARTMENT_OTHER)
Admission: EM | Admit: 2014-12-03 | Discharge: 2014-12-03 | Disposition: A | Discharge status: Home / Self Care | Payer: Medicare Other | Attending: Emergency Medicine | Admitting: Emergency Medicine

## 2014-12-03 ENCOUNTER — Emergency Department (HOSPITAL_BASED_OUTPATIENT_CLINIC_OR_DEPARTMENT_OTHER): Payer: Medicare Other

## 2014-12-03 DIAGNOSIS — M1990 Unspecified osteoarthritis, unspecified site: Secondary | ICD-10-CM | POA: Diagnosis not present

## 2014-12-03 DIAGNOSIS — I10 Essential (primary) hypertension: Secondary | ICD-10-CM | POA: Insufficient documentation

## 2014-12-03 DIAGNOSIS — Z8719 Personal history of other diseases of the digestive system: Secondary | ICD-10-CM | POA: Insufficient documentation

## 2014-12-03 DIAGNOSIS — Z7982 Long term (current) use of aspirin: Secondary | ICD-10-CM | POA: Insufficient documentation

## 2014-12-03 DIAGNOSIS — L293 Anogenital pruritus, unspecified: Secondary | ICD-10-CM | POA: Insufficient documentation

## 2014-12-03 DIAGNOSIS — Z853 Personal history of malignant neoplasm of breast: Secondary | ICD-10-CM | POA: Insufficient documentation

## 2014-12-03 DIAGNOSIS — E119 Type 2 diabetes mellitus without complications: Secondary | ICD-10-CM | POA: Diagnosis not present

## 2014-12-03 DIAGNOSIS — N898 Other specified noninflammatory disorders of vagina: Secondary | ICD-10-CM

## 2014-12-03 DIAGNOSIS — Z79899 Other long term (current) drug therapy: Secondary | ICD-10-CM | POA: Insufficient documentation

## 2014-12-03 DIAGNOSIS — R1012 Left upper quadrant pain: Secondary | ICD-10-CM | POA: Insufficient documentation

## 2014-12-03 DIAGNOSIS — I4891 Unspecified atrial fibrillation: Secondary | ICD-10-CM | POA: Diagnosis not present

## 2014-12-03 DIAGNOSIS — R109 Unspecified abdominal pain: Secondary | ICD-10-CM

## 2014-12-03 DIAGNOSIS — R1013 Epigastric pain: Secondary | ICD-10-CM | POA: Diagnosis present

## 2014-12-03 LAB — URINE MICROSCOPIC-ADD ON

## 2014-12-03 LAB — COMPREHENSIVE METABOLIC PANEL
ALBUMIN: 4.2 g/dL (ref 3.5–5.0)
ALK PHOS: 65 U/L (ref 38–126)
ALT: 8 U/L — AB (ref 14–54)
AST: 18 U/L (ref 15–41)
Anion gap: 6 (ref 5–15)
BUN: 12 mg/dL (ref 6–20)
CALCIUM: 8.8 mg/dL — AB (ref 8.9–10.3)
CO2: 27 mmol/L (ref 22–32)
CREATININE: 0.78 mg/dL (ref 0.44–1.00)
Chloride: 105 mmol/L (ref 101–111)
GFR calc Af Amer: >60 mL/min (ref >60)
GFR calc non Af Amer: >60 mL/min (ref >60)
Glucose, Bld: 115 mg/dL — ABNORMAL HIGH (ref 65–99)
Potassium: 3.3 mmol/L — ABNORMAL LOW (ref 3.5–5.1)
SODIUM: 138 mmol/L (ref 135–145)
Total Bilirubin: 0.6 mg/dL (ref 0.3–1.2)
Total Protein: 8 g/dL (ref 6.5–8.1)

## 2014-12-03 LAB — CBC WITH DIFFERENTIAL/PLATELET
BASOS PCT: 0 %
Basophils Absolute: 0 10*3/uL (ref 0.0–0.1)
EOS ABS: 0.1 10*3/uL (ref 0.0–0.7)
Eosinophils Relative: 3 %
HCT: 35.4 % — ABNORMAL LOW (ref 36.0–46.0)
HEMOGLOBIN: 11.5 g/dL — AB (ref 12.0–15.0)
Lymphocytes Relative: 20 %
Lymphs Abs: 1.1 10*3/uL (ref 0.7–4.0)
MCH: 28.2 pg (ref 26.0–34.0)
MCHC: 32.5 g/dL (ref 30.0–36.0)
MCV: 86.8 fL (ref 78.0–100.0)
Monocytes Absolute: 0.4 10*3/uL (ref 0.1–1.0)
Monocytes Relative: 8 %
NEUTROS PCT: 69 %
Neutro Abs: 3.6 10*3/uL (ref 1.7–7.7)
Platelets: 258 10*3/uL (ref 150–400)
RBC: 4.08 MIL/uL (ref 3.87–5.11)
RDW: 13.8 % (ref 11.5–15.5)
WBC: 5.3 10*3/uL (ref 4.0–10.5)

## 2014-12-03 LAB — URINALYSIS, ROUTINE W REFLEX MICROSCOPIC
Bilirubin Urine: NEGATIVE
Glucose, UA: NEGATIVE mg/dL
Hgb urine dipstick: NEGATIVE
Ketones, ur: NEGATIVE mg/dL
Leukocytes, UA: NEGATIVE
Nitrite: NEGATIVE
Protein, ur: 30 mg/dL — AB
Specific Gravity, Urine: 1.02 (ref 1.005–1.030)
Urobilinogen, UA: 1 mg/dL (ref 0.0–1.0)
pH: 6.5 (ref 5.0–8.0)

## 2014-12-03 LAB — LIPASE, BLOOD: Lipase: 32 U/L (ref 11–51)

## 2014-12-03 LAB — TROPONIN I: Troponin I: <0.03 ng/mL (ref <0.031)

## 2014-12-03 MED ORDER — CLOTRIMAZOLE 1 % VA CREA: 1.0000 | TOPICAL_CREAM | Freq: Every day | VAGINAL | Status: AC

## 2014-12-03 NOTE — ED Notes (Signed)
MD at bedside. 

## 2014-12-03 NOTE — ED Notes (Signed)
Pt c/o LLQ pain, low back pain, and vaginal itching x 3 weeks. States she has been dealing with these symptoms for "months" and has see her PCP without any relief

## 2014-12-03 NOTE — ED Notes (Signed)
EMT at bedside for EKG.

## 2014-12-03 NOTE — ED Provider Notes (Signed)
CSN: [id number]     Arrival date & time 12/03/14  1696 History   First MD Initiated Contact with Patient 12/03/14 1010     Chief Complaint  Patient presents with  . Abdominal Pain     (Consider location/radiation/quality/duration/timing/severity/associated sxs/prior Treatment) Patient is a 81 y.o. female presenting with abdominal pain.  Abdominal Pain Pain location:  Epigastric and periumbilical Pain quality: aching   Pain radiates to:  Does not radiate Pain severity:  Moderate Onset quality:  Gradual Duration: off and on for 6 months.  constant for 2 weeks. Progression:  Unchanged Chronicity:  Chronic Relieved by: has seen PCP regarding this.  Had recent negative pap smear.  Worsened by:  Nothing tried (not worse with eating.  No food fear. ) Ineffective treatments:  Acetaminophen (tried tylenol last week) Associated symptoms: no chest pain, no constipation, no diarrhea, no dysuria (+frequency and malodorous urine. ), no hematuria, no nausea, no vaginal bleeding, no vaginal discharge and no vomiting   Associated symptoms comment:  Vaginal itching.  Low back pain   Past Medical History  Diagnosis Date  . Diabetes mellitus   . Hypertension   . Atrial fibrillation ([place])     Diagnosed ~2009  . GERD (gastroesophageal reflux disease)   . Breast cancer, left breast ([place])     S/p chemo and mastectomy 1998  . Hyperlipidemia   . Left shoulder pain     "MRI showed pinched nerve" - per pt  . Arthritis    Past Surgical History  Procedure Laterality Date  . Cholecystectomy    . Knee surgery      left  . Mastectomy      left, 1998  . Joint replacement Left 2001  . Eye surgery Bilateral     cataracts  . Total knee revision Left 01/28/2013    Procedure: LEFT TOTAL KNEE ARTHROPLASTY REVISION;  Surgeon: Marianna Payment, MD;  Location: [facility];  Service: Orthopedics;  Laterality: Left;   Family History  Problem Relation Age of Onset  . Heart attack Mother     55s   Social  History  Substance Use Topics  . Smoking status: Never Smoker   . Smokeless tobacco: Current User    Types: Snuff  . Alcohol Use: No   OB History    No data available     Review of Systems  Cardiovascular: Negative for chest pain.  Gastrointestinal: Positive for abdominal pain. Negative for nausea, vomiting, diarrhea and constipation.  Genitourinary: Negative for dysuria (+frequency and malodorous urine. ), hematuria, vaginal bleeding and vaginal discharge.  All other systems reviewed and are negative.     Allergies  Review of patient's allergies indicates no known allergies.  Home Medications   Prior to Admission medications   Medication Sig Start Date End Date Taking? Authorizing Provider  amLODipine (NORVASC) 10 MG tablet Take 10 mg by mouth daily.   Yes Historical Provider, MD  aspirin EC 325 MG EC tablet Take 1 tablet (325 mg total) by mouth 2 (two) times daily. Patient taking differently: Take 325 mg by mouth daily.  01/31/13  Yes Jessy Oto, MD  Cholecalciferol (VITAMIN D PO) Take 1 capsule by mouth daily.   Yes Historical Provider, MD  lisinopril (PRINIVIL,ZESTRIL) 10 MG tablet Take 10 mg by mouth daily.   Yes Historical Provider, MD  metFORMIN (GLUCOPHAGE) 500 MG tablet Take by mouth 2 (two) times daily with a meal.   Yes Historical Provider, MD  polyethylene glycol (MIRALAX / GLYCOLAX)  packet Take 17 g by mouth daily as needed for mild constipation. 01/31/13  Yes Jessy Oto, MD  rivaroxaban (XARELTO) 20 MG TABS tablet Take 20 mg by mouth daily with supper.   Yes Historical Provider, MD  albuterol (PROVENTIL HFA;VENTOLIN HFA) 108 (90 BASE) MCG/ACT inhaler Inhale 1 puff into the lungs every 6 (six) hours as needed for wheezing or shortness of breath.    Historical Provider, MD  aspirin EC 325 MG tablet Take 1 tablet (325 mg total) by mouth 2 (two) times daily. 01/29/13   Naiping Ephriam Jenkins, MD  celecoxib (CELEBREX) 200 MG capsule Take 1 capsule (200 mg total) by mouth  every 12 (twelve) hours. 01/31/13   Jessy Oto, MD  diltiazem (CARDIZEM CD) 120 MG 24 hr capsule Take 120 mg by mouth daily.    Historical Provider, MD  HYDROcodone-acetaminophen (NORCO/VICODIN) 5-325 MG per tablet Take 2 tablets by mouth every 4 (four) hours as needed for pain. 11/16/12   Fransico Meadow, PA-C  methocarbamol (ROBAXIN) 500 MG tablet Take 1 tablet (500 mg total) by mouth every 6 (six) hours as needed for muscle spasms. 01/31/13   Jessy Oto, MD  metoprolol (LOPRESSOR) 50 MG tablet Take 50 mg by mouth 2 (two) times daily.      Historical Provider, MD  oxyCODONE (OXY IR/ROXICODONE) 5 MG immediate release tablet Take 1-3 tablets (5-15 mg total) by mouth every 4 (four) hours as needed. 01/29/13   Naiping Ephriam Jenkins, MD  oxyCODONE (OXY IR/ROXICODONE) 5 MG immediate release tablet Take 1 tablet (5 mg total) by mouth every 3 (three) hours as needed for breakthrough pain. 01/31/13   Jessy Oto, MD  OxyCODONE (OXYCONTIN) 10 mg T12A 12 hr tablet Take 1 tablet (10 mg total) by mouth every 12 (twelve) hours. 01/31/13   Jessy Oto, MD  pregabalin (LYRICA) 75 MG capsule Take 75 mg by mouth daily.    Historical Provider, MD   BP 129/96 mmHg  Pulse 94  Temp(Src) 98.4 F (36.9 C) (Oral)  Resp 19  Ht 5\' 4"  (1.626 m)  Wt 163 lb (73.936 kg)  BMI 27.97 kg/m2  SpO2 100% Physical Exam  Constitutional: She is oriented to person, place, and time. She appears well-developed and well-nourished. No distress.  HENT:  Head: Normocephalic and atraumatic.  Mouth/Throat: Oropharynx is clear and moist.  Eyes: Conjunctivae are normal. Pupils are equal, round, and reactive to light. No scleral icterus.  Neck: Neck supple.  Cardiovascular: Normal rate, regular rhythm, normal heart sounds and intact distal pulses.   No murmur heard. Pulmonary/Chest: Effort normal and breath sounds normal. No stridor. No respiratory distress. She has no rales.  Abdominal: Soft. Bowel sounds are normal. She exhibits no  distension. There is tenderness (mild LUQ). There is no rigidity, no rebound and no guarding.  Genitourinary: There is no rash or tenderness on the right labia. There is no rash or tenderness on the left labia.  Mild erythema and swelling of labia   Musculoskeletal: Normal range of motion.  No low back tenderness  Neurological: She is alert and oriented to person, place, and time.  Skin: Skin is warm and dry. No rash noted.  Psychiatric: She has a normal mood and affect. Her behavior is normal.  Nursing note and vitals reviewed.   ED Course  Procedures (including critical care time) Labs Review Labs Reviewed  CBC WITH DIFFERENTIAL/PLATELET - Abnormal; Notable for the following:    Hemoglobin 11.5 (*)  HCT 35.4 (*)    All other components within normal limits  COMPREHENSIVE METABOLIC PANEL - Abnormal; Notable for the following:    Potassium 3.3 (*)    Glucose, Bld 115 (*)    Calcium 8.8 (*)    ALT 8 (*)    All other components within normal limits  URINALYSIS, ROUTINE W REFLEX MICROSCOPIC (NOT AT [facility]) - Abnormal; Notable for the following:    Protein, ur 30 (*)    All other components within normal limits  LIPASE, BLOOD  URINE MICROSCOPIC-ADD ON  TROPONIN I    Imaging Review Dg Chest 2 View  12/03/2014  CLINICAL DATA:  Right-sided chest pain. History of previous left breast carcinoma. Atrial fibrillation. EXAM: CHEST  2 VIEW COMPARISON:  January 12, 2014 FINDINGS: There is no edema or consolidation. There is minimal scarring in the left base. Heart is borderline enlarged with pulmonary vascularity within normal limits, stable. No adenopathy. There are no appreciable bone lesions. No pneumothorax. Patient is status post left mastectomy with surgical clips in left axillary region. IMPRESSION: No edema or consolidation.  Heart prominent but stable. Electronically Signed   By: Lowella Grip III M.D.   On: 12/03/2014 14:26   I have personally reviewed and evaluated these images  and lab results as part of my medical decision-making.   EKG Interpretation   Date/Time:  Thursday December 03 2014 13:55:25 EDT Ventricular Rate:  97 PR Interval:    QRS Duration: 86 QT Interval:  380 QTC Calculation: 482 R Axis:   25 Text Interpretation:  Atrial fibrillation with premature ventricular or  aberrantly conducted complexes Septal infarct , age undetermined Abnormal  ECG No significant change was found Confirmed by [facility]  MD, TREY ([id number])  on 12/03/2014 4:06:21 PM      MDM   Final diagnoses:  Vaginal itching  Abdominal pain, unspecified abdominal location    81 year old female who presents due to vaginal itching and chronic abdominal pain.    Regarding her vaginal itching, she has appearance of a mild yeast infection. Plan to treat with clotrimazole. Also advised her to follow-up with her primary doctor regarding this.  Regarding her chronic abdominal pain, she reports having seen her primary doctor for this in the past. She has had symptoms for 6 months. Her abdominal exam is very reassuring. Her abdomen is soft, with minimal left upper quadrant tenderness, no rigidity, rebound, or guarding. Given her reassuring exam, stable labs, and 6 month time course of pain, I do not think she needs further emergent workup. Advised her to follow-up closely with her primary doctor for further testing. Also, have given return precautions.  Prior to discharge, patient had an additional complaint of right chest wall pain. She had tenderness to her right anterolateral chest wall. She says she has had this pain before as well. EKG unchanged, troponin negative, chest x-ray negative. Lungs are clear. Suspect musculoskeletal chest wall pain and I believe she can also see her primary doctor about this issue.  Throughout her ED course, she remained very well-appearing.  Serita Grit, MD 12/03/14 [phone]

## 2014-12-03 NOTE — ED Notes (Signed)
MD at bedside. Vaginal examination done with chaperone present

## 2014-12-03 NOTE — ED Notes (Signed)
Patient transported to X-ray 

## 2014-12-03 NOTE — Discharge Instructions (Signed)

## 2014-12-03 NOTE — ED Notes (Signed)
Pt has with her lab results from a pap smear earlier this month that was negative for HPV and cervical cancer

## 2015-01-06 ENCOUNTER — Observation Stay (HOSPITAL_BASED_OUTPATIENT_CLINIC_OR_DEPARTMENT_OTHER)
Admission: EM | Admit: 2015-01-06 | Discharge: 2015-01-08 | Disposition: A | Discharge status: Home / Self Care | Payer: Medicare Other | Attending: Internal Medicine | Admitting: Internal Medicine

## 2015-01-06 ENCOUNTER — Encounter (HOSPITAL_BASED_OUTPATIENT_CLINIC_OR_DEPARTMENT_OTHER): Payer: Self-pay | Admitting: Emergency Medicine

## 2015-01-06 ENCOUNTER — Emergency Department (HOSPITAL_BASED_OUTPATIENT_CLINIC_OR_DEPARTMENT_OTHER): Payer: Medicare Other

## 2015-01-06 DIAGNOSIS — I208 Other forms of angina pectoris: Secondary | ICD-10-CM | POA: Insufficient documentation

## 2015-01-06 DIAGNOSIS — R079 Chest pain, unspecified: Secondary | ICD-10-CM | POA: Diagnosis present

## 2015-01-06 DIAGNOSIS — E119 Type 2 diabetes mellitus without complications: Secondary | ICD-10-CM | POA: Insufficient documentation

## 2015-01-06 DIAGNOSIS — Z720 Tobacco use: Secondary | ICD-10-CM | POA: Diagnosis not present

## 2015-01-06 DIAGNOSIS — E785 Hyperlipidemia, unspecified: Secondary | ICD-10-CM | POA: Diagnosis present

## 2015-01-06 DIAGNOSIS — I10 Essential (primary) hypertension: Secondary | ICD-10-CM | POA: Diagnosis present

## 2015-01-06 DIAGNOSIS — I4819 Other persistent atrial fibrillation: Secondary | ICD-10-CM | POA: Diagnosis present

## 2015-01-06 DIAGNOSIS — F17200 Nicotine dependence, unspecified, uncomplicated: Secondary | ICD-10-CM | POA: Insufficient documentation

## 2015-01-06 DIAGNOSIS — M25512 Pain in left shoulder: Secondary | ICD-10-CM | POA: Diagnosis present

## 2015-01-06 DIAGNOSIS — I4891 Unspecified atrial fibrillation: Secondary | ICD-10-CM | POA: Diagnosis present

## 2015-01-06 DIAGNOSIS — Z7984 Long term (current) use of oral hypoglycemic drugs: Secondary | ICD-10-CM | POA: Insufficient documentation

## 2015-01-06 DIAGNOSIS — E118 Type 2 diabetes mellitus with unspecified complications: Secondary | ICD-10-CM | POA: Insufficient documentation

## 2015-01-06 HISTORY — DX: Pneumonia, unspecified organism: J18.9

## 2015-01-06 HISTORY — DX: Obstructive sleep apnea (adult) (pediatric): G47.33

## 2015-01-06 HISTORY — DX: Dependence on other enabling machines and devices: Z99.89

## 2015-01-06 HISTORY — DX: Type 2 diabetes mellitus without complications: E11.9

## 2015-01-06 LAB — CBC WITH DIFFERENTIAL/PLATELET
Basophils Absolute: 0 10*3/uL (ref 0.0–0.1)
Basophils Relative: 0 %
Eosinophils Absolute: 0.3 10*3/uL (ref 0.0–0.7)
Eosinophils Relative: 5 %
HEMATOCRIT: 35.5 % — AB (ref 36.0–46.0)
HEMOGLOBIN: 11.5 g/dL — AB (ref 12.0–15.0)
LYMPHS ABS: 1.8 10*3/uL (ref 0.7–4.0)
Lymphocytes Relative: 25 %
MCH: 28.3 pg (ref 26.0–34.0)
MCHC: 32.4 g/dL (ref 30.0–36.0)
MCV: 87.4 fL (ref 78.0–100.0)
MONOS PCT: 11 %
Monocytes Absolute: 0.8 10*3/uL (ref 0.1–1.0)
NEUTROS ABS: 4.3 10*3/uL (ref 1.7–7.7)
NEUTROS PCT: 59 %
Platelets: 307 10*3/uL (ref 150–400)
RBC: 4.06 MIL/uL (ref 3.87–5.11)
RDW: 14.8 % (ref 11.5–15.5)
WBC: 7.2 10*3/uL (ref 4.0–10.5)

## 2015-01-06 LAB — D-DIMER, QUANTITATIVE: D-Dimer, Quant: 0.48 ug/mL-FEU (ref 0.00–0.50)

## 2015-01-06 LAB — GLUCOSE, CAPILLARY
GLUCOSE-CAPILLARY: 80 mg/dL (ref 65–99)
GLUCOSE-CAPILLARY: 151 mg/dL — AB (ref 65–99)

## 2015-01-06 LAB — TROPONIN I: Troponin I: <0.03 ng/mL (ref <0.031)

## 2015-01-06 LAB — BASIC METABOLIC PANEL
Anion gap: 7 (ref 5–15)
BUN: 12 mg/dL (ref 6–20)
CHLORIDE: 105 mmol/L (ref 101–111)
CO2: 25 mmol/L (ref 22–32)
CREATININE: 0.91 mg/dL (ref 0.44–1.00)
Calcium: 9.4 mg/dL (ref 8.9–10.3)
GFR calc Af Amer: >60 mL/min (ref >60)
GFR calc non Af Amer: 58 mL/min — ABNORMAL LOW (ref >60)
GLUCOSE: 100 mg/dL — AB (ref 65–99)
Potassium: 4.3 mmol/L (ref 3.5–5.1)
Sodium: 137 mmol/L (ref 135–145)

## 2015-01-06 LAB — CBG MONITORING, ED: Glucose-Capillary: 80 mg/dL (ref 65–99)

## 2015-01-06 MED ORDER — DILTIAZEM HCL ER COATED BEADS 120 MG PO CP24
120.0000 mg | ORAL_CAPSULE | Freq: Every day | ORAL | Status: DC
  Administered 2015-01-07 – 2015-01-08 (×2): 120 mg via ORAL
  Filled 2015-01-06 (×2): qty 1

## 2015-01-06 MED ORDER — MORPHINE SULFATE (PF) 2 MG/ML IV SOLN
2.0000 mg | INTRAVENOUS | Status: DC | PRN
  Administered 2015-01-07: 2 mg via INTRAVENOUS
  Filled 2015-01-06: qty 1

## 2015-01-06 MED ORDER — ASPIRIN 81 MG PO CHEW
324.0000 mg | CHEWABLE_TABLET | Freq: Once | ORAL | Status: AC
  Administered 2015-01-06: 324 mg via ORAL
  Filled 2015-01-06: qty 4

## 2015-01-06 MED ORDER — VITAMIN D 1000 UNITS PO TABS
1000.0000 [IU] | ORAL_TABLET | Freq: Every day | ORAL | Status: DC
  Administered 2015-01-07 – 2015-01-08 (×2): 1000 [IU] via ORAL
  Filled 2015-01-06 (×2): qty 1

## 2015-01-06 MED ORDER — INSULIN ASPART 100 UNIT/ML ~~LOC~~ SOLN
0.0000 [IU] | Freq: Three times a day (TID) | SUBCUTANEOUS | Status: DC
  Administered 2015-01-07: 1 [IU] via SUBCUTANEOUS

## 2015-01-06 MED ORDER — DOCUSATE SODIUM 100 MG PO CAPS
100.0000 mg | ORAL_CAPSULE | Freq: Two times a day (BID) | ORAL | Status: DC
  Administered 2015-01-07 – 2015-01-08 (×2): 100 mg via ORAL
  Filled 2015-01-06 (×4): qty 1

## 2015-01-06 MED ORDER — POLYETHYLENE GLYCOL 3350 17 G PO PACK: 17.0000 g | PACK | Freq: Every day | ORAL | Status: DC | PRN

## 2015-01-06 MED ORDER — ACETAMINOPHEN 325 MG PO TABS: 650.0000 mg | ORAL_TABLET | ORAL | Status: DC | PRN

## 2015-01-06 MED ORDER — INFLUENZA VAC SPLIT QUAD 0.5 ML IM SUSY
0.5000 mL | PREFILLED_SYRINGE | INTRAMUSCULAR | Status: AC
  Administered 2015-01-07: 0.5 mL via INTRAMUSCULAR

## 2015-01-06 MED ORDER — ALBUTEROL SULFATE (2.5 MG/3ML) 0.083% IN NEBU: 2.5000 mg | INHALATION_SOLUTION | Freq: Four times a day (QID) | RESPIRATORY_TRACT | Status: DC | PRN

## 2015-01-06 MED ORDER — METHOCARBAMOL 500 MG PO TABS: 500.0000 mg | ORAL_TABLET | Freq: Four times a day (QID) | ORAL | Status: DC | PRN

## 2015-01-06 MED ORDER — PREGABALIN 50 MG PO CAPS: 75.0000 mg | ORAL_CAPSULE | Freq: Every day | ORAL | Status: DC

## 2015-01-06 MED ORDER — GI COCKTAIL ~~LOC~~
30.0000 mL | Freq: Four times a day (QID) | ORAL | Status: DC | PRN
Start: 2015-01-06 — End: 2015-01-08
  Administered 2015-01-07: 30 mL via ORAL
  Filled 2015-01-06: qty 30

## 2015-01-06 MED ORDER — OXYCODONE HCL ER 10 MG PO T12A: 10.0000 mg | EXTENDED_RELEASE_TABLET | Freq: Two times a day (BID) | ORAL | Status: DC

## 2015-01-06 MED ORDER — ONDANSETRON HCL 4 MG/2ML IJ SOLN: 4.0000 mg | Freq: Four times a day (QID) | INTRAMUSCULAR | Status: DC | PRN

## 2015-01-06 MED ORDER — RIVAROXABAN 20 MG PO TABS
20.0000 mg | ORAL_TABLET | Freq: Every day | ORAL | Status: DC
  Administered 2015-01-07 – 2015-01-08 (×2): 20 mg via ORAL
  Filled 2015-01-06 (×2): qty 1

## 2015-01-06 MED ORDER — ASPIRIN EC 325 MG PO TBEC
325.0000 mg | DELAYED_RELEASE_TABLET | Freq: Every day | ORAL | Status: DC
  Administered 2015-01-06: 325 mg via ORAL

## 2015-01-06 MED ORDER — LISINOPRIL 10 MG PO TABS
10.0000 mg | ORAL_TABLET | Freq: Every day | ORAL | Status: DC
  Administered 2015-01-07 – 2015-01-08 (×2): 10 mg via ORAL
  Filled 2015-01-06 (×2): qty 1

## 2015-01-06 MED ORDER — AMLODIPINE BESYLATE 10 MG PO TABS
10.0000 mg | ORAL_TABLET | Freq: Every day | ORAL | Status: DC
  Administered 2015-01-07 – 2015-01-08 (×2): 10 mg via ORAL
  Filled 2015-01-06 (×2): qty 1

## 2015-01-06 MED ORDER — ENSURE ENLIVE PO LIQD
237.0000 mL | Freq: Two times a day (BID) | ORAL | Status: DC
  Administered 2015-01-07 – 2015-01-08 (×2): 237 mL via ORAL

## 2015-01-06 MED ORDER — ASPIRIN EC 325 MG PO TBEC
325.0000 mg | DELAYED_RELEASE_TABLET | Freq: Every day | ORAL | Status: DC
  Administered 2015-01-07 – 2015-01-08 (×2): 325 mg via ORAL
  Filled 2015-01-06 (×3): qty 1

## 2015-01-06 MED ORDER — METOPROLOL TARTRATE 50 MG PO TABS: 50.0000 mg | ORAL_TABLET | Freq: Two times a day (BID) | ORAL | Status: DC

## 2015-01-06 MED ORDER — PANTOPRAZOLE SODIUM 40 MG PO TBEC
40.0000 mg | DELAYED_RELEASE_TABLET | Freq: Every day | ORAL | Status: DC
  Administered 2015-01-06 – 2015-01-08 (×3): 40 mg via ORAL
  Filled 2015-01-06 (×3): qty 1

## 2015-01-06 NOTE — Progress Notes (Signed)
Transfer from [facility] to admit for chest pain obs, tele bed requested.   Faye Ramsay, MD  [facility] Pager [phone]  If 7PM-7AM, please contact night-coverage [email or web address] Password [email or web address]

## 2015-01-06 NOTE — ED Notes (Signed)
MD at bedside. 

## 2015-01-06 NOTE — ED Notes (Signed)
[facility] at bedside for transport. 

## 2015-01-06 NOTE — ED Notes (Signed)
MD at bedside to discuss admission

## 2015-01-06 NOTE — H&P (Signed)
[facility] History and Physical                                                                                    [name], is a 81 y.o. female  MRN: [id number]   DOB - 28-Jan-1934  Admit Date - 01/06/2015  Outpatient Primary MD for the patient is [facility], [place]  Referring Physician:  Leo Grosser, EDP  Chief Complaint:   Chief Complaint  Patient presents with  . Chest Pain     HPI  [name]  is a 81 y.o. female, with diabetes, atrial fibrillation, hyperlipidemia, chronic pain in her neck and back as well as a history of breast cancer who presents to [facility] today with chest pain. The patient reports that last night at 9 PM she developed centralized sharp chest pain that would last a few seconds and was intermittent in nature. At first she thought it was gas and took Gas-X, but had no relief. The pain kept her awake all night and when it was still present this morning she decided to seek evaluation. The patient states when she initially had the pain she had an episode where she became very hot, dizzy and sweaty and saw black dots across her field of vision. She was concerned she may be hypoglycemic. She reports that she hasn't been eating much lately and has been losing weight over the past year.  The patient returned from a cruise to [place] early Sunday morning at approximately 1 AM.  In the emergency department her troponin was less than 0.03, d-dimer was within normal limits (0.45). Her EKG shows atrial fibrillation with rapid ventricular response. Glucose is 100. Of note the patient is on both Xarelto and daily aspirin.  Review of Systems  Constitutional: Negative.   Eyes: Negative.   Respiratory: Negative.   Cardiovascular: Positive for chest pain.  Gastrointestinal: Positive for abdominal pain, diarrhea and constipation.  Genitourinary: Negative.   Musculoskeletal: Positive for back pain and neck pain.  Skin: Negative.   Neurological:  Positive for dizziness.  Endo/Heme/Allergies: Negative.   Psychiatric/Behavioral: Negative.      Past Medical History  Past Medical History  Diagnosis Date  . Diabetes mellitus   . Hypertension   . Atrial fibrillation ([place])     Diagnosed ~2009  . GERD (gastroesophageal reflux disease)   . Breast cancer, left breast ([place])     S/p chemo and mastectomy 1998  . Hyperlipidemia   . Left shoulder pain     "MRI showed pinched nerve" - per pt  . Arthritis     Past Surgical History  Procedure Laterality Date  . Cholecystectomy    . Knee surgery      left  . Mastectomy      left, 1998  . Joint replacement Left 2001  . Eye surgery Bilateral     cataracts  . Total knee revision Left 01/28/2013    Procedure: LEFT TOTAL KNEE ARTHROPLASTY REVISION;  Surgeon: Marianna Payment, MD;  Location: [facility];  Service: Orthopedics;  Laterality: Left;      Social History Social History  Substance Use Topics  . Smoking status: Never Smoker   .  Smokeless tobacco: Current User    Types: Snuff  . Alcohol Use: No  Lives in [place].  Independent with ADLs.  Walks independently.  Family History Family History  Problem Relation Age of Onset  . Heart attack Mother     54s    Prior to Admission medications   Medication Sig Start Date End Date Taking? Authorizing Provider  albuterol (PROVENTIL HFA;VENTOLIN HFA) 108 (90 BASE) MCG/ACT inhaler Inhale 1 puff into the lungs every 6 (six) hours as needed for wheezing or shortness of breath.    Historical Provider, MD  amLODipine (NORVASC) 10 MG tablet Take 10 mg by mouth daily.    Historical Provider, MD  aspirin EC 325 MG EC tablet Take 1 tablet (325 mg total) by mouth 2 (two) times daily. Patient taking differently: Take 325 mg by mouth daily.  01/31/13   Jessy Oto, MD  celecoxib (CELEBREX) 200 MG capsule Take 1 capsule (200 mg total) by mouth every 12 (twelve) hours. 01/31/13   Jessy Oto, MD  Cholecalciferol (VITAMIN D PO) Take 1  capsule by mouth daily.    Historical Provider, MD  diltiazem (CARDIZEM CD) 120 MG 24 hr capsule Take 120 mg by mouth daily.    Historical Provider, MD  lisinopril (PRINIVIL,ZESTRIL) 10 MG tablet Take 10 mg by mouth daily.    Historical Provider, MD  metFORMIN (GLUCOPHAGE) 500 MG tablet Take by mouth 2 (two) times daily with a meal.    Historical Provider, MD  methocarbamol (ROBAXIN) 500 MG tablet Take 1 tablet (500 mg total) by mouth every 6 (six) hours as needed for muscle spasms. 01/31/13   Jessy Oto, MD  metoprolol (LOPRESSOR) 50 MG tablet Take 50 mg by mouth 2 (two) times daily.      Historical Provider, MD  oxyCODONE (OXY IR/ROXICODONE) 5 MG immediate release tablet Take 1-3 tablets (5-15 mg total) by mouth every 4 (four) hours as needed. 01/29/13   Leandrew Koyanagi, MD  OxyCODONE (OXYCONTIN) 10 mg T12A 12 hr tablet Take 1 tablet (10 mg total) by mouth every 12 (twelve) hours. 01/31/13   Jessy Oto, MD  polyethylene glycol [facility] / Floria Raveling) packet Take 17 g by mouth daily as needed for mild constipation. 01/31/13   Jessy Oto, MD  pregabalin (LYRICA) 75 MG capsule Take 75 mg by mouth daily.    Historical Provider, MD  rivaroxaban (XARELTO) 20 MG TABS tablet Take 20 mg by mouth daily with supper.    Historical Provider, MD    No Known Allergies  Physical Exam  Vitals  Blood pressure 121/80, pulse 76, temperature 98.4 F (36.9 C), temperature source Oral, resp. rate 18, height 5\' 4"  (1.626 m), weight 71.033 kg (156 lb 9.6 oz), SpO2 100 %.   General:  lying in bed in NAD, very pleasant  Psych:  Normal affect and insight, Not Suicidal or Homicidal, Awake Alert, Oriented X 3.  Neuro:   No F.N deficits, ALL C.Nerves Intact, Strength 5/5 all 4 extremities, Sensation intact all 4 extremities.  ENT:  Ears and Eyes appear Normal, Conjunctivae clear, PER. Moist oral mucosa without erythema or exudates.  Neck:  Supple, No lymphadenopathy appreciated  Respiratory:  Symmetrical  chest wall movement, Good air movement bilaterally, CTAB.  Cardiac:  RRR, No Murmurs, no LE edema noted, no JVD.  No chest pain with inspiration, or M/S exertion.  Abdomen:  Positive bowel sounds, Soft, Non tender, Non distended,  No masses appreciated  Skin:  No  Cyanosis, Normal Skin Turgor, No Skin Rash or Bruise.  Extremities:  Able to move all 4. 5/5 strength in each,  no effusions.  Data Review  Wt Readings from Last 3 Encounters:  01/06/15 71.033 kg (156 lb 9.6 oz)  12/03/14 73.936 kg (163 lb)  12/20/13 70.761 kg (156 lb)    CBC  Recent Labs Lab 01/06/15 1015  WBC 7.2  HGB 11.5*  HCT 35.5*  PLT 307  MCV 87.4  MCH 28.3  MCHC 32.4  RDW 14.8  LYMPHSABS 1.8  MONOABS 0.8  EOSABS 0.3  BASOSABS 0.0    Chemistries   Recent Labs Lab 01/06/15 1045  NA 137  K 4.3  CL 105  CO2 25  GLUCOSE 100*  BUN 12  CREATININE 0.91  CALCIUM 9.4      Lab Results  Component Value Date   HGBA1C 6.6* 05/03/2011       Recent Labs  01/06/15 1115  DDIMER 0.48    Cardiac Enzymes  Recent Labs Lab 01/06/15 1015  TROPONINI <0.03     Imaging results:   Dg Chest 2 View  01/06/2015  CLINICAL DATA:  Left-sided chest pain for 1 day. History of breast carcinoma EXAM: CHEST  2 VIEW COMPARISON:  December 03, 2014 FINDINGS: There is no edema or consolidation. Heart is mildly enlarged with pulmonary vascularity within normal limits. No adenopathy. Patient is status post left mastectomy with surgical clips in the left axillary region. No bone lesions are appreciable. IMPRESSION: No edema or consolidation. Stable mild cardiac enlargement. Status post left mastectomy. Electronically Signed   By: Lowella Grip III M.D.   On: 01/06/2015 11:02    My personal review of EKG: Afib with RVR.  No significant change since last tracing.   Assessment & Plan  Principal Problem:   Chest pain Active Problems:   Atrial fibrillation (H[facility])   Left shoulder pain   Hypertension    Diabetes (HCC)   A-fib (HCC)   HLD (hyperlipidemia)   Atypical chest pain Uncertain etiology. Possibly was related to GI issues. We will cycle troponin, she had a mildly decreased 45% LVEF in 2013 so we will repeat a 2-D echo. GI cocktail PRN, PPI.  Will call cardiology unless her troponins are elevated or her echocardiogram is significantly changed. Continue 325 mg aspirin.  Atrial fibrillation Rate controlled.  Continue metoprolol, diltiazem takes Xarelto for anticoagulations.   Chronic left shoulder and neck pain Continue home medication regimen.  Hold Celebrex   Diabetes mellitus Will check hemoglobin A1c. Place on sliding scale sensitive. I am concerned she may have had some hypoglycemia. Will hold metformin for now.   Hypertension. Controlled. Continue amlodipine, diltiazem, lisinopril, metoprolol     Consultants Called:   None   Family Communication:    Patient is alert, orientated and understands their plan of care.  Code Status:    DO NOT RESUSCITATE, but amenable to BiPAP and pressors  Condition:    Guarded  Potential Disposition:   To home 12/1 if workup is negative.  Time spent in minutes : [facility] [place],  [place] on 01/06/2015 at 2:56 PM Between 7am to 7pm - Pager - [phone] After 7pm go to [email or web address] - password [email or web address] And look for the night coverage person covering me after hours

## 2015-01-06 NOTE — ED Notes (Signed)
Chest pain since yesterday with some sob.  Recent trip to [place], flight to [place].  Some diaphoresis with back pain and dizziness.  Several risk factors including afib

## 2015-01-06 NOTE — ED Notes (Signed)
Patient transported to X-ray 

## 2015-01-06 NOTE — ED Notes (Signed)
Attempt x 1 to call report to [facility]- they will call back- [facility] at bedside- Pt given room assignment and contact numbers for [facility]

## 2015-01-06 NOTE — ED Provider Notes (Signed)
CSN: [id number]     Arrival date & time 01/06/15  1008 History   First MD Initiated Contact with Patient 01/06/15 1018     Chief Complaint  Patient presents with  . Chest Pain     (Consider location/radiation/quality/duration/timing/severity/associated sxs/prior Treatment) Patient is a 81 y.o. female presenting with chest pain. The history is provided by the patient.  Chest Pain Pain location:  Substernal area Pain quality: sharp   Pain radiates to:  Does not radiate Pain radiates to the back: no   Pain severity:  Moderate Onset quality:  Gradual Duration:  1 day Timing:  Constant Progression:  Unchanged Chronicity:  New Context: raising an arm and at rest   Context: not breathing, not lifting and no movement   Relieved by:  Nothing Worsened by:  Nothing tried Ineffective treatments:  None tried Associated symptoms: cough (over the last year intermittently)   Associated symptoms: no abdominal pain, no fever, no nausea, no shortness of breath and not vomiting   Cough:    Cough characteristics:  Non-productive   Severity:  Moderate   Onset quality:  Gradual   Timing:  Constant   Chronicity:  Chronic Risk factors: diabetes mellitus, high cholesterol and hypertension   Risk factors: no coronary artery disease, no prior DVT/PE and no smoking     Past Medical History  Diagnosis Date  . Diabetes mellitus   . Hypertension   . Atrial fibrillation ([place])     Diagnosed ~2009  . GERD (gastroesophageal reflux disease)   . Breast cancer, left breast ([place])     S/p chemo and mastectomy 1998  . Hyperlipidemia   . Left shoulder pain     "MRI showed pinched nerve" - per pt  . Arthritis    Past Surgical History  Procedure Laterality Date  . Cholecystectomy    . Knee surgery      left  . Mastectomy      left, 1998  . Joint replacement Left 2001  . Eye surgery Bilateral     cataracts  . Total knee revision Left 01/28/2013    Procedure: LEFT TOTAL KNEE ARTHROPLASTY REVISION;   Surgeon: Marianna Payment, MD;  Location: [facility];  Service: Orthopedics;  Laterality: Left;   Family History  Problem Relation Age of Onset  . Heart attack Mother     39s   Social History  Substance Use Topics  . Smoking status: Never Smoker   . Smokeless tobacco: Current User    Types: Snuff  . Alcohol Use: No   OB History    No data available     Review of Systems  Constitutional: Negative for fever.  Respiratory: Positive for cough (over the last year intermittently). Negative for shortness of breath.   Cardiovascular: Positive for chest pain.  Gastrointestinal: Negative for nausea, vomiting and abdominal pain.  All other systems reviewed and are negative.     Allergies  Review of patient's allergies indicates no known allergies.  Home Medications   Prior to Admission medications   Medication Sig Start Date End Date Taking? Authorizing Provider  albuterol (PROVENTIL HFA;VENTOLIN HFA) 108 (90 BASE) MCG/ACT inhaler Inhale 1 puff into the lungs every 6 (six) hours as needed for wheezing or shortness of breath.    Historical Provider, MD  amLODipine (NORVASC) 10 MG tablet Take 10 mg by mouth daily.    Historical Provider, MD  aspirin EC 325 MG EC tablet Take 1 tablet (325 mg total) by mouth 2 (two) times  daily. Patient taking differently: Take 325 mg by mouth daily.  01/31/13   Jessy Oto, MD  celecoxib (CELEBREX) 200 MG capsule Take 1 capsule (200 mg total) by mouth every 12 (twelve) hours. 01/31/13   Jessy Oto, MD  Cholecalciferol (VITAMIN D PO) Take 1 capsule by mouth daily.    Historical Provider, MD  diltiazem (CARDIZEM CD) 120 MG 24 hr capsule Take 120 mg by mouth daily.    Historical Provider, MD  HYDROcodone-acetaminophen (NORCO/VICODIN) 5-325 MG per tablet Take 2 tablets by mouth every 4 (four) hours as needed for pain. 11/16/12   Fransico Meadow, PA-C  lisinopril (PRINIVIL,ZESTRIL) 10 MG tablet Take 10 mg by mouth daily.    Historical Provider, MD   metFORMIN (GLUCOPHAGE) 500 MG tablet Take by mouth 2 (two) times daily with a meal.    Historical Provider, MD  methocarbamol (ROBAXIN) 500 MG tablet Take 1 tablet (500 mg total) by mouth every 6 (six) hours as needed for muscle spasms. 01/31/13   Jessy Oto, MD  metoprolol (LOPRESSOR) 50 MG tablet Take 50 mg by mouth 2 (two) times daily.      Historical Provider, MD  oxyCODONE (OXY IR/ROXICODONE) 5 MG immediate release tablet Take 1-3 tablets (5-15 mg total) by mouth every 4 (four) hours as needed. 01/29/13   Naiping Ephriam Jenkins, MD  oxyCODONE (OXY IR/ROXICODONE) 5 MG immediate release tablet Take 1 tablet (5 mg total) by mouth every 3 (three) hours as needed for breakthrough pain. 01/31/13   Jessy Oto, MD  OxyCODONE (OXYCONTIN) 10 mg T12A 12 hr tablet Take 1 tablet (10 mg total) by mouth every 12 (twelve) hours. 01/31/13   Jessy Oto, MD  polyethylene glycol [facility] / Floria Raveling) packet Take 17 g by mouth daily as needed for mild constipation. 01/31/13   Jessy Oto, MD  pregabalin (LYRICA) 75 MG capsule Take 75 mg by mouth daily.    Historical Provider, MD  rivaroxaban (XARELTO) 20 MG TABS tablet Take 20 mg by mouth daily with supper.    Historical Provider, MD   BP 119/71 mmHg  Pulse 82  Temp(Src) 98 F (36.7 C) (Oral)  Resp 18  Ht 5\' 5"  (1.651 m)  Wt 163 lb (73.936 kg)  BMI 27.12 kg/m2  SpO2 99% Physical Exam  Constitutional: She is oriented to person, place, and time. She appears well-developed and well-nourished. No distress.  HENT:  Head: Normocephalic.  Eyes: Conjunctivae are normal.  Neck: Neck supple. No tracheal deviation present.  Cardiovascular: Normal rate, regular rhythm and normal heart sounds.   Pulmonary/Chest: Effort normal and breath sounds normal. No respiratory distress. She has no wheezes. She has no rales. She exhibits no tenderness.  Abdominal: Soft. She exhibits no distension.  Neurological: She is alert and oriented to person, place, and time.  Skin:  Skin is warm and dry.  Psychiatric: She has a normal mood and affect.  Vitals reviewed.   ED Course  Procedures (including critical care time) Labs Review Labs Reviewed  CBC WITH DIFFERENTIAL/PLATELET - Abnormal; Notable for the following:    Hemoglobin 11.5 (*)    HCT 35.5 (*)    All other components within normal limits  BASIC METABOLIC PANEL - Abnormal; Notable for the following:    Glucose, Bld 100 (*)    GFR calc non Af Amer 58 (*)    All other components within normal limits  GLUCOSE, CAPILLARY - Abnormal; Notable for the following:    Glucose-Capillary 151 (*)  All other components within normal limits  TROPONIN I  D-DIMER, QUANTITATIVE (NOT AT [facility])    Imaging Review Dg Chest 2 View  01/06/2015  CLINICAL DATA:  Left-sided chest pain for 1 day. History of breast carcinoma EXAM: CHEST  2 VIEW COMPARISON:  December 03, 2014 FINDINGS: There is no edema or consolidation. Heart is mildly enlarged with pulmonary vascularity within normal limits. No adenopathy. Patient is status post left mastectomy with surgical clips in the left axillary region. No bone lesions are appreciable. IMPRESSION: No edema or consolidation. Stable mild cardiac enlargement. Status post left mastectomy. Electronically Signed   By: Lowella Grip III M.D.   On: 01/06/2015 11:02   I have personally reviewed and evaluated these images and lab results as part of my medical decision-making.   EKG Interpretation   Date/Time:  Wednesday January 06 2015 10:13:03 EST Ventricular Rate:  102 PR Interval:    QRS Duration: 84 QT Interval:  360 QTC Calculation: 469 R Axis:   83 Text Interpretation:  Atrial fibrillation with rapid ventricular response  Septal infarct , age undetermined Abnormal ECG No significant change since  last tracing Confirmed by Adonte Vanriper MD, Quillian Quince [id number]) on 01/06/2015 10:19:30  AM      MDM   Final diagnoses:  Chest pain, unspecified chest pain type    81 year old female  presents with intermittent upper chest pain that is sharp in nature at rest since last night. No exertional symptoms. States she has had some shortness of breath associated with the pain. Heart score is 4, risk of ACS is elevated. First troponin is negative. Had recent travel history so d-dimer was ordered to risk stratify for PE, this is negative and patient is anticoagulated on Xarelto so doubt PE. Hospitalist was consulted for admission and accepted the patient in transfer to facility capable of caring for patient further to observe for high risk chest pain.     Leo Grosser, MD 01/07/15 [phone]

## 2015-01-07 ENCOUNTER — Ambulatory Visit (HOSPITAL_COMMUNITY): Payer: Medicare Other

## 2015-01-07 ENCOUNTER — Observation Stay (HOSPITAL_BASED_OUTPATIENT_CLINIC_OR_DEPARTMENT_OTHER): Payer: Medicare Other

## 2015-01-07 ENCOUNTER — Encounter (HOSPITAL_COMMUNITY): Payer: Self-pay | Admitting: Cardiology

## 2015-01-07 ENCOUNTER — Encounter (HOSPITAL_COMMUNITY): Payer: Medicare Other

## 2015-01-07 DIAGNOSIS — R079 Chest pain, unspecified: Secondary | ICD-10-CM | POA: Diagnosis not present

## 2015-01-07 DIAGNOSIS — I482 Chronic atrial fibrillation: Secondary | ICD-10-CM | POA: Diagnosis not present

## 2015-01-07 DIAGNOSIS — I10 Essential (primary) hypertension: Secondary | ICD-10-CM

## 2015-01-07 LAB — NM MYOCAR MULTI W/SPECT W/WALL MOTION / EF
CHL CUP MPHR: 141 {beats}/min
CHL CUP NUCLEAR SDS: 1
CHL CUP RESTING HR STRESS: 82 {beats}/min
CHL RATE OF PERCEIVED EXERTION: 0
CSEPEDS: 0 s
CSEPEW: 1 METS
Exercise duration (min): 0 min
LV sys vol: 55 mL
LVDIAVOL: 91 mL
Peak HR: 112 {beats}/min
Percent HR: 79 %
RATE: 0.37
SRS: 4
SSS: 5
TID: 1.23

## 2015-01-07 LAB — GLUCOSE, CAPILLARY
Glucose-Capillary: 99 mg/dL (ref 65–99)
Glucose-Capillary: 102 mg/dL — ABNORMAL HIGH (ref 65–99)
Glucose-Capillary: 144 mg/dL — ABNORMAL HIGH (ref 65–99)
Glucose-Capillary: 112 mg/dL — ABNORMAL HIGH (ref 65–99)

## 2015-01-07 LAB — TROPONIN I

## 2015-01-07 MED ORDER — TECHNETIUM TC 99M SESTAMIBI GENERIC - CARDIOLITE
10.0000 | Freq: Once | INTRAVENOUS | Status: AC | PRN
  Administered 2015-01-07: 10 via INTRAVENOUS

## 2015-01-07 MED ORDER — PANTOPRAZOLE SODIUM 40 MG PO TBEC: 40.0000 mg | DELAYED_RELEASE_TABLET | Freq: Every day | ORAL | Status: DC

## 2015-01-07 MED ORDER — ENSURE ENLIVE PO LIQD: 237.0000 mL | Freq: Two times a day (BID) | ORAL | Status: DC

## 2015-01-07 MED ORDER — REGADENOSON 0.4 MG/5ML IV SOLN
INTRAVENOUS | Status: AC
  Filled 2015-01-07: qty 5

## 2015-01-07 MED ORDER — REGADENOSON 0.4 MG/5ML IV SOLN
0.4000 mg | Freq: Once | INTRAVENOUS | Status: AC
  Administered 2015-01-07: 0.4 mg via INTRAVENOUS

## 2015-01-07 MED ORDER — TECHNETIUM TC 99M SESTAMIBI GENERIC - CARDIOLITE
30.0000 | Freq: Once | INTRAVENOUS | Status: AC | PRN
  Administered 2015-01-07: 30 via INTRAVENOUS

## 2015-01-07 NOTE — Progress Notes (Signed)
Nuclear stress test with no ischemia and septal scar and echo with low normal LVF with EF 50-55% with wall motion abnormalities.  No further workup at this time.  CP is atypical.

## 2015-01-07 NOTE — Progress Notes (Signed)
Initial Nutrition Assessment  DOCUMENTATION CODES:   Not applicable  INTERVENTION:   Diet advancement per MD Ensure Enlive po BID, each supplement provides 350 kcal and 20 grams of protein with advancement   NUTRITION DIAGNOSIS:   Inadequate oral intake related to poor appetite as evidenced by per patient/family report, percent weight loss.  GOAL:   Patient will meet greater than or equal to 90% of their needs  MONITOR:   PO intake, Supplement acceptance, Labs, I & O's  REASON FOR ASSESSMENT:   Consult, Malnutrition Screening Tool Assessment of nutrition requirement/status  ASSESSMENT:   [name] is a 81 y.o. female, with diabetes, atrial fibrillation, hyperlipidemia, chronic pain in her neck and back as well as a history of breast cancer who presents to [facility] today with chest pain. The patient reports that last night at 9 PM she developed centralized sharp chest pain that would last a few seconds and was intermittent in nature.  Spoke with pt at bedside. She admits to wt loss due to poor appetite of idiopathic nature. Says that she will get food in front of her and then not want it.  Reports no Nausea/Vomiting/Chewing/Swallowing issues Some diarrhea at home. Pt exhibits 9#/5% moderate wt loss in 1 month.  Drinks boost and ensure on and off at home.  Labs and Medications reviewed. Will provide Ensure with diet advancement.  Diet Order:  Diet NPO time specified Diet - low sodium heart healthy  Skin:  Reviewed, no issues  Last BM:  01/06/2015  Height:   Ht Readings from Last 1 Encounters:  01/06/15 5\' 4"  (1.626 m)    Weight:   Wt Readings from Last 1 Encounters:  01/07/15 154 lb 3.2 oz (69.945 kg)    Ideal Body Weight:  54.54 kg  BMI:  Body mass index is 26.46 kg/(m^2).  Estimated Nutritional Needs:   Kcal:  1400-1700 calories  Protein:  56-70 grams  Fluid:  >/= 1.4L  EDUCATION NEEDS:   No education needs identified  at this time  Satira Anis. Koleton Duchemin, MS, RD LDN After Hours/Weekend Pager [phone]

## 2015-01-07 NOTE — Progress Notes (Signed)
  Echocardiogram 2D Echocardiogram has been performed.  Darlina Sicilian M 01/07/2015, 4:24 PM

## 2015-01-07 NOTE — Consult Note (Signed)
Reason for Consult: chest pain   Referring Physician: Dr. Marily Memos   PCP:  No PCP Per Patient  Primary Cardiologist:was Dr. Electa Sniff [name] is an 81 y.o. female.    Chief Complaint: admitted 01/06/15 with chest pain   HPI:  Asked to see 81 year old female that was admitted yesterday for chest pain.  She has a hx of a fib PAF since 2009, (previously has been on metoprolol and multaq) CHADS2 score 3 and xarelto added in 2013.   HTN HLD and DM also breast cancer.  On discharge 04/2011 pt was in [place] but all EKGs since 2014 have been a fib at times with RVR and at times controlled.   Her pain began at 9 pm the night before admit, central sharp pain would last a few seconds and was intermittent.  Gas-X without relief.  Pt had recently traveled on cruise to [place].    In ER she was found to be in a fib with RVR.  Her troponins are neg.   Past Medical History  Diagnosis Date  . Hypertension   . Atrial fibrillation ([place])     Diagnosed ~2009  . GERD (gastroesophageal reflux disease)   . Hyperlipidemia   . Left shoulder pain     "MRI showed pinched nerve" - per pt  . Pneumonia     "4-5 times" (01/06/2015)  . OSA on CPAP   . Type II diabetes mellitus ([place])   . Arthritis     "all over"  . Breast cancer, left breast ([place]) 1998    S/P chemo and mastectomy     Past Surgical History  Procedure Laterality Date  . Total knee arthroplasty Left 2001  . Mastectomy Left 1998  . Joint replacement    . Cataract extraction w/ intraocular lens  implant, bilateral Bilateral   . Total knee revision Left 01/28/2013    Procedure: LEFT TOTAL KNEE ARTHROPLASTY REVISION;  Surgeon: Marianna Payment, MD;  Location: [facility];  Service: Orthopedics;  Laterality: Left;  . Laparoscopic cholecystectomy    . Breast biopsy Left 1998    Family History  Problem Relation Age of Onset  . Heart attack Mother     22s   Social History:  reports that she has never smoked. Her smokeless  tobacco use includes Snuff. She reports that she drinks alcohol. She reports that she does not use illicit drugs.  Allergies: No Known Allergies  OUTPATIENT MEDICATIONS: No current facility-administered medications on file prior to encounter.   Current Outpatient Prescriptions on File Prior to Encounter  Medication Sig Dispense Refill  . albuterol (PROVENTIL HFA;VENTOLIN HFA) 108 (90 BASE) MCG/ACT inhaler Inhale 1 puff into the lungs every 6 (six) hours as needed for wheezing or shortness of breath.    Marland Kitchen amLODipine (NORVASC) 10 MG tablet Take 10 mg by mouth daily.    . Cholecalciferol (VITAMIN D PO) Take 1 capsule by mouth daily.    Marland Kitchen diltiazem (CARDIZEM CD) 120 MG 24 hr capsule Take 120 mg by mouth daily.    Marland Kitchen lisinopril (PRINIVIL,ZESTRIL) 10 MG tablet Take 10 mg by mouth daily.    . metFORMIN (GLUCOPHAGE) 500 MG tablet Take 500 mg by mouth 2 (two) times daily with a meal.     . rivaroxaban (XARELTO) 20 MG TABS tablet Take 20 mg by mouth every morning.      CURRENT MEDICATIONS: Scheduled Meds: . amLODipine  10 mg Oral Daily  .  aspirin EC  325 mg Oral Daily  . cholecalciferol  1,000 Units Oral Daily  . diltiazem  120 mg Oral Daily  . docusate sodium  100 mg Oral BID  . feeding supplement (ENSURE ENLIVE)  237 mL Oral BID BM  . Influenza vac split quadrivalent PF  0.5 mL Intramuscular Tomorrow-1000  . insulin aspart  0-9 Units Subcutaneous TID WC  . lisinopril  10 mg Oral Daily  . pantoprazole  40 mg Oral Daily  . rivaroxaban  20 mg Oral Daily   Continuous Infusions:  PRN Meds:.acetaminophen, albuterol, gi cocktail, morphine injection, ondansetron (ZOFRAN) IV   Results for orders placed or performed during the hospital encounter of 01/06/15 (from the past 48 hour(s))  Troponin I     Status: None   Collection Time: 01/06/15 10:15 AM  Result Value Ref Range   Troponin I <0.03 <0.031 ng/mL    Comment:        NO INDICATION OF MYOCARDIAL INJURY.   CBC with Differential/Platelet      Status: Abnormal   Collection Time: 01/06/15 10:15 AM  Result Value Ref Range   WBC 7.2 4.0 - 10.5 K/uL   RBC 4.06 3.87 - 5.11 MIL/uL   Hemoglobin 11.5 (L) 12.0 - 15.0 g/dL   HCT 35.5 (L) 36.0 - 46.0 %   MCV 87.4 78.0 - 100.0 fL   MCH 28.3 26.0 - 34.0 pg   MCHC 32.4 30.0 - 36.0 g/dL   RDW 14.8 11.5 - 15.5 %   Platelets 307 150 - 400 K/uL   Neutrophils Relative % 59 %   Neutro Abs 4.3 1.7 - 7.7 K/uL   Lymphocytes Relative 25 %   Lymphs Abs 1.8 0.7 - 4.0 K/uL   Monocytes Relative 11 %   Monocytes Absolute 0.8 0.1 - 1.0 K/uL   Eosinophils Relative 5 %   Eosinophils Absolute 0.3 0.0 - 0.7 K/uL   Basophils Relative 0 %   Basophils Absolute 0.0 0.0 - 0.1 K/uL  Basic metabolic panel     Status: Abnormal   Collection Time: 01/06/15 10:45 AM  Result Value Ref Range   Sodium 137 135 - 145 mmol/L   Potassium 4.3 3.5 - 5.1 mmol/L   Chloride 105 101 - 111 mmol/L   CO2 25 22 - 32 mmol/L   Glucose, Bld 100 (H) 65 - 99 mg/dL   BUN 12 6 - 20 mg/dL   Creatinine, Ser 0.91 0.44 - 1.00 mg/dL   Calcium 9.4 8.9 - 10.3 mg/dL   GFR calc non Af Amer 58 (L) >60 mL/min   GFR calc Af Amer >60 >60 mL/min    Comment: (NOTE) The eGFR has been calculated using the CKD EPI equation. This calculation has not been validated in all clinical situations. eGFR's persistently <60 mL/min signify possible Chronic Kidney Disease.    Anion gap 7 5 - 15  D-dimer, quantitative (not at [facility])     Status: None   Collection Time: 01/06/15 11:15 AM  Result Value Ref Range   D-Dimer, Quant 0.48 0.00 - 0.50 ug/mL-FEU    Comment: (NOTE) At the manufacturer cut-off of 0.50 ug/mL FEU, this assay has been documented to exclude PE with a sensitivity and negative predictive value of 97 to 99%.  At this time, this assay has not been approved by the FDA to exclude DVT/VTE. Results should be correlated with clinical presentation.   POC CBG, ED     Status: None   Collection Time: 01/06/15  1:09  PM  Result Value Ref Range     Glucose-Capillary 80 65 - 99 mg/dL  Troponin I-serum (0, 3, 6 hours)     Status: None   Collection Time: 01/06/15  3:13 PM  Result Value Ref Range   Troponin I <0.03 <0.031 ng/mL    Comment:        NO INDICATION OF MYOCARDIAL INJURY.   Glucose, capillary     Status: None   Collection Time: 01/06/15  5:22 PM  Result Value Ref Range   Glucose-Capillary 80 65 - 99 mg/dL   Comment 1 Notify RN    Comment 2 Document in Chart   Troponin I-serum (0, 3, 6 hours)     Status: None   Collection Time: 01/06/15  8:16 PM  Result Value Ref Range   Troponin I <0.03 <0.031 ng/mL    Comment:        NO INDICATION OF MYOCARDIAL INJURY.   Glucose, capillary     Status: Abnormal   Collection Time: 01/06/15  8:33 PM  Result Value Ref Range   Glucose-Capillary 151 (H) 65 - 99 mg/dL  Troponin I-serum (0, 3, 6 hours)     Status: None   Collection Time: 01/07/15  2:19 AM  Result Value Ref Range   Troponin I <0.03 <0.031 ng/mL    Comment:        NO INDICATION OF MYOCARDIAL INJURY.   Glucose, capillary     Status: None   Collection Time: 01/07/15  7:45 AM  Result Value Ref Range   Glucose-Capillary 99 65 - 99 mg/dL   Dg Chest 2 View  01/06/2015  CLINICAL DATA:  Left-sided chest pain for 1 day. History of breast carcinoma EXAM: CHEST  2 VIEW COMPARISON:  December 03, 2014 FINDINGS: There is no edema or consolidation. Heart is mildly enlarged with pulmonary vascularity within normal limits. No adenopathy. Patient is status post left mastectomy with surgical clips in the left axillary region. No bone lesions are appreciable. IMPRESSION: No edema or consolidation. Stable mild cardiac enlargement. Status post left mastectomy. Electronically Signed   By: Lowella Grip III M.D.   On: 01/06/2015 11:02    ROS: General:no colds or fevers, no weight changes Skin:no rashes or ulcers HEENT:no blurred vision, no congestion CV:see HPI PUL:see HPI GI:+ diarrhea no constipation or melena, no  indigestion GU:no hematuria, no dysuria MS:no joint pain, no claudication, + back pain and neck pain and lt arm. Neuro:no syncope, no lightheadedness Endo:+ diabetes, no thyroid disease   Blood pressure 120/100, pulse 90, temperature 98.2 F (36.8 C), temperature source Oral, resp. rate 18, height 5' 4"  (1.626 m), weight 154 lb 3.2 oz (69.945 kg), SpO2 97 %.  Wt Readings from Last 3 Encounters:  01/07/15 154 lb 3.2 oz (69.945 kg)  12/03/14 163 lb (73.936 kg)  12/20/13 156 lb (70.761 kg)    PE: General:Pleasant affect, NAD Skin:Warm and dry, brisk capillary refill HEENT:normocephalic, sclera clear, mucus membranes moist Neck:supple, no JVD, no bruits  Heart:S1S2 Irregularly irregular without murmur, gallup, rub or click Lungs:clear without rales, rhonchi, or wheezes ZYY:QMGN, non tender, + BS, do not palpate liver spleen or masses Ext:no lower ext edema, 2+ pedal pulses, 2+ radial pulses Neuro:alert and oriented, MAE, follows commands, + facial symmetry    Assessment/Plan Principal Problem:   Chest pain Active Problems:   Atrial fibrillation (HCC)   Left shoulder pain   Hypertension   Diabetes (HCC)   A-fib (HCC)   HLD (hyperlipidemia)  Tobacco abuse   Diabetes mellitus with complication [facility])    GEXBMW,UXLKG R  Nurse Practitioner Certified [place] Pager [phone] or after 5pm or weekends call [phone] 01/07/2015, 11:43 AM   Patient seen and examined with Cecilie Kicks, NP. We discussed all aspects of the encounter. I agree with the assessment and plan as stated above.  ASSESSMENT/PLAN: 1.  Chest pain with typical and atypical components.  Her cardiac enzymes are negative.  Her CP is sharp with no radiation but she has also had some sharp pain in her upper back and neck with tingling in her left arm at times so ? Whether this is musculoskeletal from cervical disc disease.  She has had some mild DOE but no nausea.  She did break out in a sweat  with her pain yesterday.  Cardiac enzymes are negative and EKG is nonischemic.  She is NPO.  Will plan Leane Call today to rule out ischemia. 2.  Chronic atrial fibrillation rated controlled.  Continue Xarelto and Cardizem 3.  HTN - controlled on CCB/ACE I and amlodipine.

## 2015-01-07 NOTE — Discharge Summary (Signed)
PATIENT DETAILS Name: [name] Age: 81 y.o. Sex: female Date of Birth: 13-Jun-1933 MRN: [id number]. Admitting Physician: Waldemar Dickens, MD PCP:No PCP Per Patient  Admit Date: 01/06/2015 Discharge date: 01/08/2015  Recommendations for Outpatient Follow-up:  1. Age appropriate general health maintenance   PRIMARY DISCHARGE DIAGNOSIS:  Principal Problem:   Chest pain Active Problems:   Atrial fibrillation (HCC)   Left shoulder pain   Hypertension   Diabetes (HCC)   A-fib (HCC)   HLD (hyperlipidemia)   Tobacco abuse   Diabetes mellitus with complication ([place])      PAST MEDICAL HISTORY: Past Medical History  Diagnosis Date  . Hypertension   . Atrial fibrillation ([place])     Diagnosed ~2009  . GERD (gastroesophageal reflux disease)   . Hyperlipidemia   . Left shoulder pain     "MRI showed pinched nerve" - per pt  . Pneumonia     "4-5 times" (01/06/2015)  . OSA on CPAP   . Type II diabetes mellitus ([place])   . Arthritis     "all over"  . Breast cancer, left breast ([place]) 1998    S/P chemo and mastectomy     DISCHARGE MEDICATIONS: Current Discharge Medication List    START taking these medications   Details  Alum & Mag Hydroxide-Simeth (GI COCKTAIL) SUSP suspension Take 30 mLs by mouth 4 (four) times daily as needed for indigestion (or chest pain). Shake well. Qty: 300 mL, Refills: 0    feeding supplement, ENSURE ENLIVE, (ENSURE ENLIVE) LIQD Take 237 mLs by mouth 2 (two) times daily between meals. Qty: 60 Bottle, Refills: 0    pantoprazole (PROTONIX) 40 MG tablet Take 1 tablet (40 mg total) by mouth daily. Qty: 30 tablet, Refills: 0      CONTINUE these medications which have NOT CHANGED   Details  albuterol (PROVENTIL HFA;VENTOLIN HFA) 108 (90 BASE) MCG/ACT inhaler Inhale 1 puff into the lungs every 6 (six) hours as needed for wheezing or shortness of breath.    amLODipine (NORVASC) 10 MG tablet Take 10 mg by mouth daily.    aspirin EC 81 MG tablet  Take 81 mg by mouth daily.    Cholecalciferol (VITAMIN D PO) Take 1 capsule by mouth daily.    diltiazem (CARDIZEM CD) 120 MG 24 hr capsule Take 120 mg by mouth daily.    lisinopril (PRINIVIL,ZESTRIL) 10 MG tablet Take 10 mg by mouth daily.    metFORMIN (GLUCOPHAGE) 500 MG tablet Take 500 mg by mouth 2 (two) times daily with a meal.     rivaroxaban (XARELTO) 20 MG TABS tablet Take 20 mg by mouth every morning.         ALLERGIES:   Allergies  Allergen Reactions  . Peanuts [Peanut Oil] Cough    BRIEF HPI:  See H&P, Labs, Consult and Test reports for all details in brief, patient was admitted for evaluation of chest pain  CONSULTATIONS:   cardiology  PERTINENT RADIOLOGIC STUDIES: Dg Chest 2 View  01/06/2015  CLINICAL DATA:  Left-sided chest pain for 1 day. History of breast carcinoma EXAM: CHEST  2 VIEW COMPARISON:  December 03, 2014 FINDINGS: There is no edema or consolidation. Heart is mildly enlarged with pulmonary vascularity within normal limits. No adenopathy. Patient is status post left mastectomy with surgical clips in the left axillary region. No bone lesions are appreciable. IMPRESSION: No edema or consolidation. Stable mild cardiac enlargement. Status post left mastectomy. Electronically Signed   By: Lowella Grip III  M.D.   On: 01/06/2015 11:02   Nm Myocar Multi W/spect W/wall Motion / Ef  01/07/2015   There was no ST segment deviation noted during stress.  No T wave inversion was noted during stress.  Defect 1: There is a medium defect of moderate severity.  Findings consistent with prior myocardial infarction.  Nuclear stress EF: 40%.  Moderate size and intensity fixed basal to mid-septal defect suggestive of scar. No significant reversible ischemia. LVEF 40% with septal akinesis. This is an intermediate risk study.     PERTINENT LAB RESULTS: CBC:  Recent Labs  01/06/15 1015  WBC 7.2  HGB 11.5*  HCT 35.5*  PLT 307   CMET CMP     Component Value  Date/Time   NA 137 01/06/2015 1045   K 4.3 01/06/2015 1045   CL 105 01/06/2015 1045   CO2 25 01/06/2015 1045   GLUCOSE 100* 01/06/2015 1045   BUN 12 01/06/2015 1045   CREATININE 0.91 01/06/2015 1045   CALCIUM 9.4 01/06/2015 1045   PROT 8.0 12/03/2014 1059   ALBUMIN 4.2 12/03/2014 1059   AST 18 12/03/2014 1059   ALT 8* 12/03/2014 1059   ALKPHOS 65 12/03/2014 1059   BILITOT 0.6 12/03/2014 1059   GFRNONAA 58* 01/06/2015 1045   GFRAA >60 01/06/2015 1045    GFR Estimated Creatinine Clearance: 48 mL/min (by C-G formula based on Cr of 0.91). No results for input(s): LIPASE, AMYLASE in the last 72 hours.  Recent Labs  01/06/15 1513 01/06/15 2016 01/07/15 0219  TROPONINI <0.03 <0.03 <0.03   Invalid input(s): POCBNP  Recent Labs  01/06/15 1115  DDIMER 0.48    Recent Labs  01/06/15 2016  HGBA1C 6.7*   No results for input(s): CHOL, HDL, LDLCALC, TRIG, CHOLHDL, LDLDIRECT in the last 72 hours. No results for input(s): TSH, T4TOTAL, T3FREE, THYROIDAB in the last 72 hours.  Invalid input(s): FREET3 No results for input(s): VITAMINB12, FOLATE, FERRITIN, TIBC, IRON, RETICCTPCT in the last 72 hours. Coags: No results for input(s): INR in the last 72 hours.  Invalid input(s): PT Microbiology: No results found for this or any previous visit (from the past 240 hour(s)).   BRIEF HOSPITAL COURSE:  Chest pain: With both typical and atypical features-cardiac enzymes were negative, seen by cardiology-underwent a stress test that was negative.Echo showed preserved EF. Suspect could be from GI etiology-will start PPI and prn GI cocktail.  Completely chest pain-free at the time of discharge-stable for discharge.  Atrial fibrillation: Chronic issue, rate controlled with Cardizem, continue Xarelto   Type 2 diabetes: CBGs stable-resume metformin on discharge.  Essential hypertension: Stable-continue amlodipine, lisinopril. and Cardizem. Further optimization deferred to the outpatient  setting  Rest of her medical issues were stable during this brief hospitalization  TODAY-DAY OF DISCHARGE:  Subjective:   Rebeckah Kortz today has no headache,no chest abdominal pain,no new weakness tingling or numbness, feels much better wants to go home today.   Objective:   Blood pressure 119/67, pulse 85, temperature 97.5 F (36.4 C), temperature source Oral, resp. rate 18, height 5\' 4"  (1.626 m), weight 69.673 kg (153 lb 9.6 oz), SpO2 99 %.  Intake/Output Summary (Last 24 hours) at 01/08/15 0834 Last data filed at 01/08/15 0400  Gross per 24 hour  Intake   1110 ml  Output    900 ml  Net    210 ml   Filed Weights   01/06/15 1405 01/07/15 0506 01/08/15 0513  Weight: 71.033 kg (156 lb 9.6 oz) 69.945 kg (154  lb 3.2 oz) 69.673 kg (153 lb 9.6 oz)    Exam Awake Alert, Oriented *3, No new F.N deficits, Normal affect [place].AT,PERRAL Supple Neck,No JVD, No cervical lymphadenopathy appriciated.  Symmetrical Chest wall movement, Good air movement bilaterally, CTAB RRR,No Gallops,Rubs or new Murmurs, No Parasternal Heave +ve B.Sounds, Abd Soft, Non tender, No organomegaly appriciated, No rebound -guarding or rigidity. No Cyanosis, Clubbing or edema, No new Rash or bruise  DISCHARGE CONDITION: Stable  DISPOSITION: Home  DISCHARGE INSTRUCTIONS:    Activity:  As tolerated  Get Medicines reviewed and adjusted: Please take all your medications with you for your next visit with your Primary MD  Please request your Primary MD to go over all hospital tests and procedure/radiological results at the follow up, please ask your Primary MD to get all Hospital records sent to his/her office.  If you experience worsening of your admission symptoms, develop shortness of breath, life threatening emergency, suicidal or homicidal thoughts you must seek medical attention immediately by calling 911 or calling your MD immediately  if symptoms less severe.  You must read complete  instructions/literature along with all the possible adverse reactions/side effects for all the Medicines you take and that have been prescribed to you. Take any new Medicines after you have completely understood and accpet all the possible adverse reactions/side effects.   Do not drive when taking Pain medications.   Do not take more than prescribed Pain, Sleep and Anxiety Medications  Special Instructions: If you have smoked or chewed Tobacco  in the last 2 yrs please stop smoking, stop any regular Alcohol  and or any Recreational drug use.  Wear Seat belts while driving.  Please note  You were cared for by a hospitalist during your hospital stay. Once you are discharged, your primary care physician will handle any further medical issues. Please note that NO REFILLS for any discharge medications will be authorized once you are discharged, as it is imperative that you return to your primary care physician (or establish a relationship with a primary care physician if you do not have one) for your aftercare needs so that they can reassess your need for medications and monitor your lab values.   Diet recommendation: Diabetic Diet Heart Healthy diet  Discharge Instructions    Call MD for:  severe uncontrolled pain    Complete by:  As directed      Diet - low sodium heart healthy    Complete by:  As directed      Increase activity slowly    Complete by:  As directed           Follow-up Information    Schedule an appointment as soon as possible for a visit in 1 week to follow up.   Why:  Hospital follow up   Contact information:   Primary MD in [place]     Total Time spent on discharge equals 25 minutes.  SignedOren Binet 01/08/2015 8:34 AM

## 2015-01-07 NOTE — Progress Notes (Signed)
PATIENT DETAILS Name: [name] Age: 81 y.o. Sex: female Date of Birth: 1933/11/07 Admit Date: 01/06/2015 Admitting Physician Waldemar Dickens, MD PCP:No PCP Per Patient  Subjective: No further chest pain-anxious to go home  Assessment/Plan: Principal Problem: Chest pain:no further chest pain-cardiac enzymes neg-seen by cards-nuc stress done-pending results. If neg-home later today.  Active Problems: Atrial fibrillation: Chronic issue, rate controlled with Cardizem, continue Xarelto   Type 2 diabetes: CBGs stable-with SSI-resume metformin on discharge.  Essential hypertension: Stable-continue amlodipine, lisinopril. and Cardizem.  Disposition: Remain inpatient-home later today if nuc stress test neg  Antimicrobial agents  See below  Anti-infectives    None      DVT Prophylaxis: Prophylactic Lovenox or Heparin or SCD's  Code Status: Full code or DNR  Family Communication None  Procedures: None  CONSULTS:  cardiology  Time spent 30 minutes-Greater than 50% of this time was spent in counseling, explanation of diagnosis, planning of further management, and coordination of care.  MEDICATIONS: Scheduled Meds: . amLODipine  10 mg Oral Daily  . aspirin EC  325 mg Oral Daily  . cholecalciferol  1,000 Units Oral Daily  . diltiazem  120 mg Oral Daily  . docusate sodium  100 mg Oral BID  . feeding supplement (ENSURE ENLIVE)  237 mL Oral BID BM  . Influenza vac split quadrivalent PF  0.5 mL Intramuscular Tomorrow-1000  . insulin aspart  0-9 Units Subcutaneous TID WC  . lisinopril  10 mg Oral Daily  . pantoprazole  40 mg Oral Daily  . regadenoson      . rivaroxaban  20 mg Oral Daily   Continuous Infusions:  PRN Meds:.acetaminophen, albuterol, gi cocktail, morphine injection, ondansetron (ZOFRAN) IV    PHYSICAL EXAM: Vital signs in last 24 hours: Filed Vitals:   01/07/15 1340 01/07/15 1341 01/07/15 1343 01/07/15 1345  BP: 109/56  113/62 97/62 101/60  Pulse: 100 92 88 98  Temp:      TempSrc:      Resp:      Height:      Weight:      SpO2:        Weight change:  Filed Weights   01/06/15 1012 01/06/15 1405 01/07/15 0506  Weight: 73.936 kg (163 lb) 71.033 kg (156 lb 9.6 oz) 69.945 kg (154 lb 3.2 oz)   Body mass index is 26.46 kg/(m^2).   Gen Exam: Awake and alert with clear speech.  Neck: Supple, No JVD.  Chest: B/L Clear.   CVS: S1 S2 Regular, no murmurs.  Abdomen: soft, BS +, non tender, non distended.  Extremities: no edema, lower extremities warm to touch. Neurologic: Non Focal.   Skin: No Rash.   Wounds: N/A.    Intake/Output from previous day:  Intake/Output Summary (Last 24 hours) at 01/07/15 1535 Last data filed at 01/07/15 0900  Gross per 24 hour  Intake    600 ml  Output   1150 ml  Net   -550 ml     LAB RESULTS: CBC  Recent Labs Lab 01/06/15 1015  WBC 7.2  HGB 11.5*  HCT 35.5*  PLT 307  MCV 87.4  MCH 28.3  MCHC 32.4  RDW 14.8  LYMPHSABS 1.8  MONOABS 0.8  EOSABS 0.3  BASOSABS 0.0    Chemistries   Recent Labs Lab 01/06/15 1045  NA 137  K 4.3  CL 105  CO2 25  GLUCOSE 100*  BUN 12  CREATININE 0.91  CALCIUM 9.4    CBG:  Recent Labs Lab 01/06/15 1309 01/06/15 1722 01/06/15 2033 01/07/15 0745 01/07/15 1141  GLUCAP 80 80 151* 99 102*    GFR Estimated Creatinine Clearance: 48.1 mL/min (by C-G formula based on Cr of 0.91).  Coagulation profile No results for input(s): INR, PROTIME in the last 168 hours.  Cardiac Enzymes  Recent Labs Lab 01/06/15 1513 01/06/15 2016 01/07/15 0219  TROPONINI <0.03 <0.03 <0.03    Invalid input(s): POCBNP  Recent Labs  01/06/15 1115  DDIMER 0.48   No results for input(s): HGBA1C in the last 72 hours. No results for input(s): CHOL, HDL, LDLCALC, TRIG, CHOLHDL, LDLDIRECT in the last 72 hours. No results for input(s): TSH, T4TOTAL, T3FREE, THYROIDAB in the last 72 hours.  Invalid input(s): FREET3 No results  for input(s): VITAMINB12, FOLATE, FERRITIN, TIBC, IRON, RETICCTPCT in the last 72 hours. No results for input(s): LIPASE, AMYLASE in the last 72 hours.  Urine Studies No results for input(s): UHGB, CRYS in the last 72 hours.  Invalid input(s): UACOL, UAPR, USPG, UPH, UTP, UGL, UKET, UBIL, UNIT, UROB, ULEU, UEPI, UWBC, URBC, UBAC, CAST, UCOM, BILUA  MICROBIOLOGY: No results found for this or any previous visit (from the past 240 hour(s)).  RADIOLOGY STUDIES/RESULTS: Dg Chest 2 View  01/06/2015  CLINICAL DATA:  Left-sided chest pain for 1 day. History of breast carcinoma EXAM: CHEST  2 VIEW COMPARISON:  December 03, 2014 FINDINGS: There is no edema or consolidation. Heart is mildly enlarged with pulmonary vascularity within normal limits. No adenopathy. Patient is status post left mastectomy with surgical clips in the left axillary region. No bone lesions are appreciable. IMPRESSION: No edema or consolidation. Stable mild cardiac enlargement. Status post left mastectomy. Electronically Signed   By: Lowella Grip III M.D.   On: 01/06/2015 11:02    Oren Binet, MD  [facility] Pager:[phone]  If 7PM-7AM, please contact night-coverage [email or web address] Password [email or web address] 01/07/2015, 3:35 PM

## 2015-01-07 NOTE — Progress Notes (Signed)
Pt notified of no significant change in stress test.

## 2015-01-08 DIAGNOSIS — I482 Chronic atrial fibrillation: Secondary | ICD-10-CM | POA: Diagnosis not present

## 2015-01-08 DIAGNOSIS — I10 Essential (primary) hypertension: Secondary | ICD-10-CM | POA: Diagnosis not present

## 2015-01-08 DIAGNOSIS — R079 Chest pain, unspecified: Secondary | ICD-10-CM | POA: Diagnosis not present

## 2015-01-08 DIAGNOSIS — E118 Type 2 diabetes mellitus with unspecified complications: Secondary | ICD-10-CM

## 2015-01-08 LAB — GLUCOSE, CAPILLARY
GLUCOSE-CAPILLARY: 93 mg/dL (ref 65–99)
Glucose-Capillary: 118 mg/dL — ABNORMAL HIGH (ref 65–99)

## 2015-01-08 LAB — HEMOGLOBIN A1C
HEMOGLOBIN A1C: 6.7 % — AB (ref 4.8–5.6)
MEAN PLASMA GLUCOSE: 146 mg/dL

## 2015-01-08 MED ORDER — GI COCKTAIL ~~LOC~~: 30.0000 mL | Freq: Four times a day (QID) | ORAL | Status: DC | PRN

## 2015-01-08 NOTE — Care Management Obs Status (Signed)
[place] NOTIFICATION   Patient Details  Name: [name] MRN: [id number] Date of Birth: Jun 24, 1933   Medicare Observation Status Notification Given:  Yes    Bethena Roys, RN 01/08/2015, 9:59 AM

## 2015-01-08 NOTE — Care Management (Signed)
1137 01-08-15 Jacqlyn Krauss, RN, [facility] Follow was set up for disposition needs. No further needs from CM at this time. Bethena Roys, RN,BSN [phone]

## 2015-01-08 NOTE — Discharge Instructions (Signed)
Follow with Primary MD in 1 week   Please get a complete blood count and chemistry panel checked by your Primary MD at your next visit, and again as instructed by your Primary MD.  Get Medicines reviewed and adjusted. Please take all your medications with you for your next visit with your Primary MD  Please request your Primary MD to go over all hospital tests and procedure/radiological results at the follow up, please ask your Primary MD to get all Hospital records sent to his/her office.  If you experience worsening of your admission symptoms, develop shortness of breath, life threatening emergency, suicidal or homicidal thoughts you must seek medical attention immediately by calling 911 or calling your MD immediately  if symptoms less severe.  You must read complete instructions/literature along with all the possible adverse reactions/side effects for all the Medicines you take and that have been prescribed to you. Take any new Medicines after you have completely understood and accpet all the possible adverse reactions/side effects.   Do not drive when taking Pain medications or sleeping medications (Benzodaizepines)  Do not take more than prescribed Pain, Sleep and Anxiety Medications  Special Instructions: If you have smoked or chewed Tobacco  in the last 2 yrs please stop smoking, stop any regular Alcohol  and or any Recreational drug use.  Wear Seat belts while driving.  Please note  You were cared for by a hospitalist during your hospital stay. Once you are discharged, your primary care physician will handle any further medical issues. Please note that NO REFILLS for any discharge medications will be authorized once you are discharged, as it is imperative that you return to your primary care physician (or establish a relationship with a primary care physician if you do not have one) for your aftercare needs so that they can reassess your need for medications and monitor your lab  values.

## 2016-01-12 DIAGNOSIS — Z853 Personal history of malignant neoplasm of breast: Secondary | ICD-10-CM | POA: Insufficient documentation

## 2016-01-27 ENCOUNTER — Encounter: Payer: Self-pay | Admitting: Family Medicine

## 2016-02-02 IMAGING — CR DG CHEST 2V
2 series · 2 of 2 positions shown · non-contrast
Comparison: December 03, 2014

CLINICAL DATA: Left-sided chest pain for 1 day. History of breast
carcinoma

EXAM:
CHEST  2 VIEW

[w chest pa]
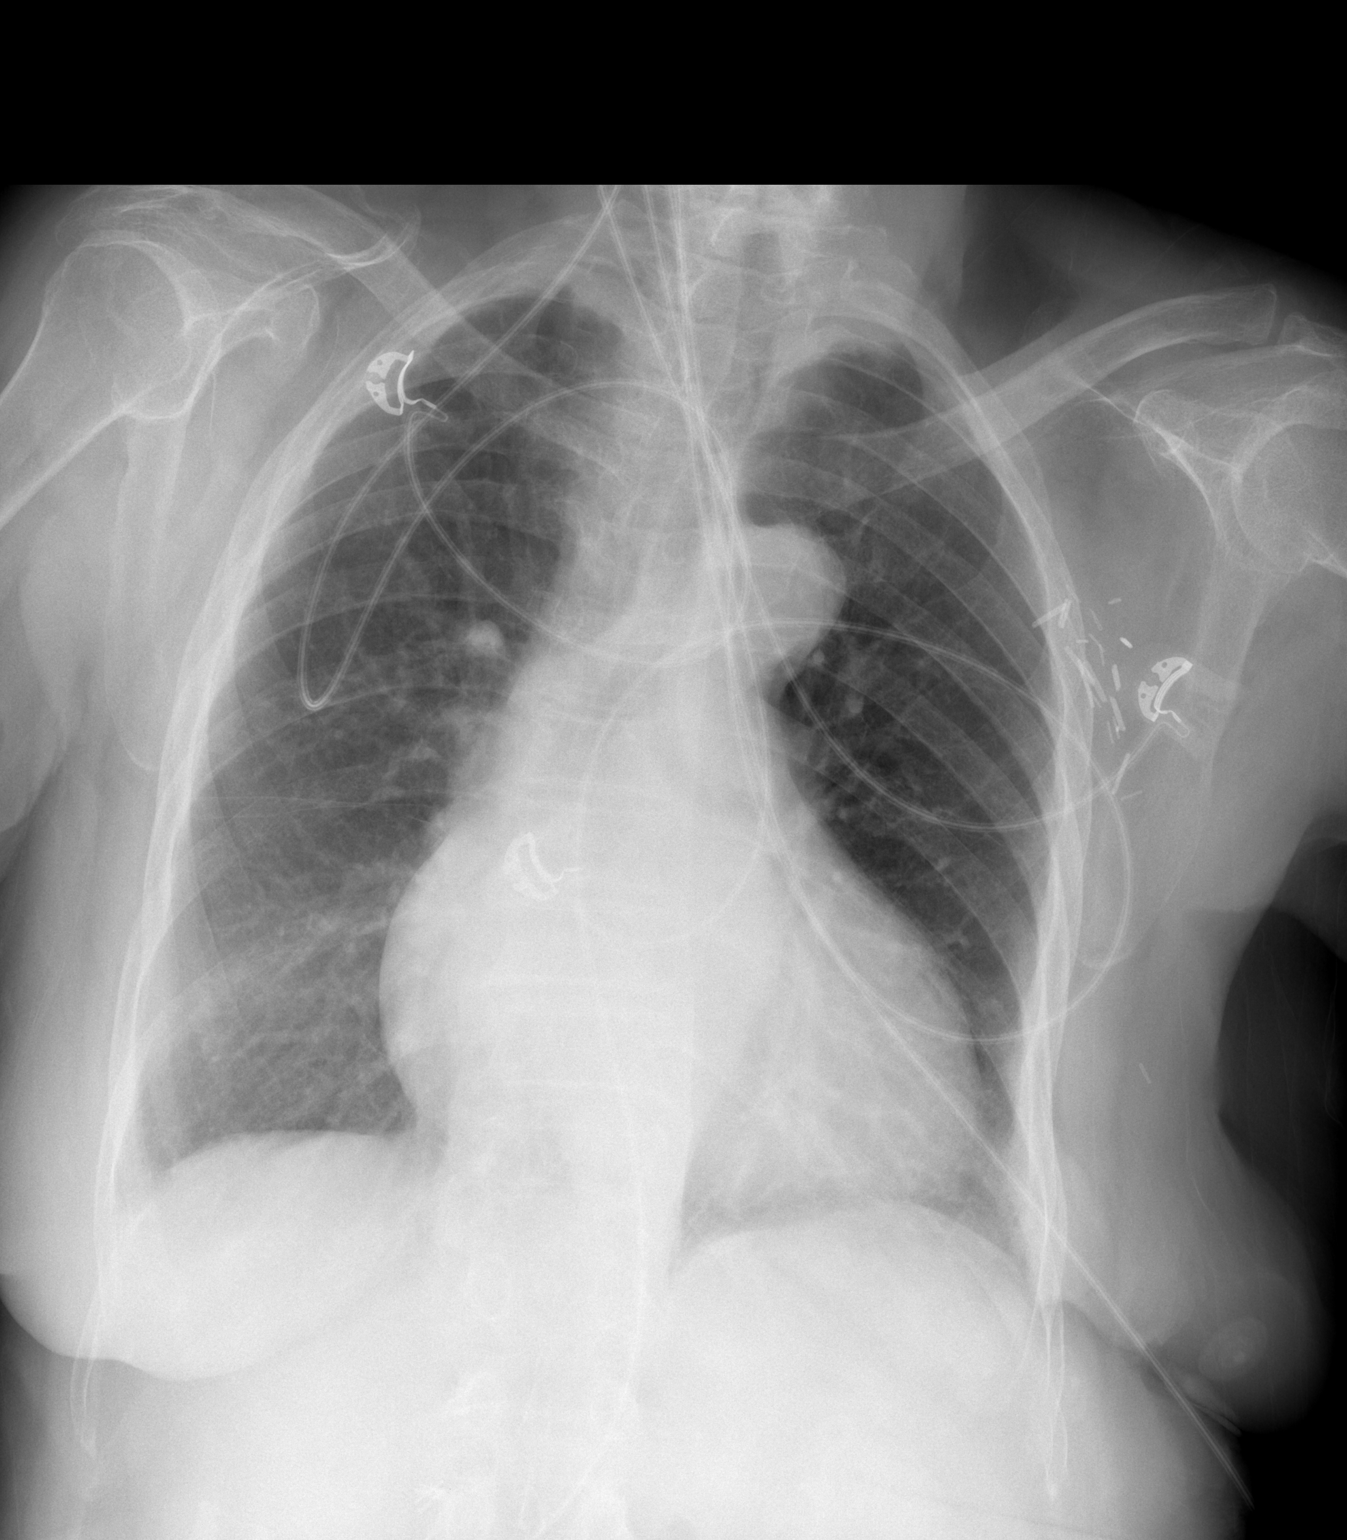

[w chest lat]
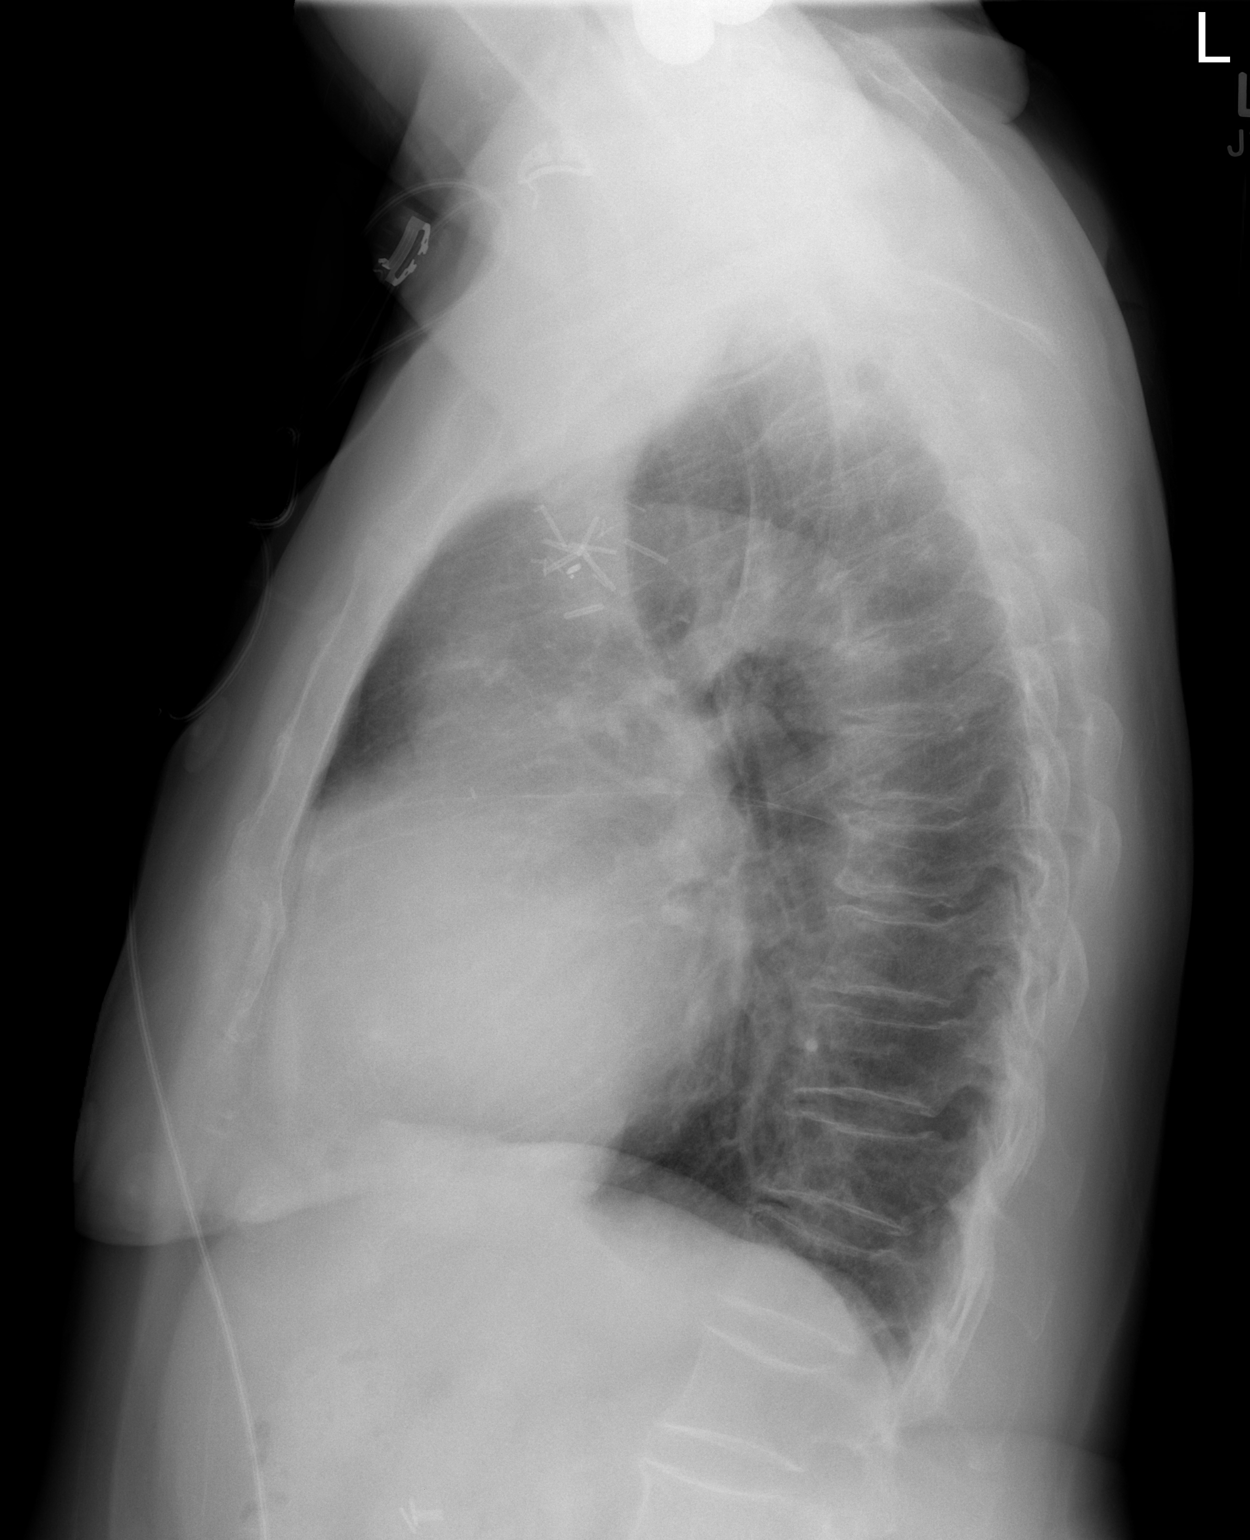

[2 of 2 positions shown; findings below may reference images not displayed]

FINDINGS: There is no edema or consolidation. Heart is mildly enlarged with
pulmonary vascularity within normal limits. No adenopathy. Patient
is status post left mastectomy with surgical clips in the left
axillary region. No bone lesions are appreciable.
IMPRESSION: No edema or consolidation. Stable mild cardiac enlargement. Status
post left mastectomy.

## 2016-08-16 ENCOUNTER — Ambulatory Visit: Payer: Self-pay | Admitting: Allergy and Immunology

## 2016-09-07 ENCOUNTER — Ambulatory Visit (INDEPENDENT_AMBULATORY_CARE_PROVIDER_SITE_OTHER): Payer: Medicare Other | Admitting: Allergy and Immunology

## 2016-09-07 ENCOUNTER — Encounter: Payer: Self-pay | Admitting: Allergy and Immunology

## 2016-09-07 VITALS — BP 106/60 | HR 84 | Temp 98.1°F | Resp 20 | Ht 64.17 in | Wt 155.2 lb

## 2016-09-07 DIAGNOSIS — L500 Allergic urticaria: Secondary | ICD-10-CM | POA: Diagnosis not present

## 2016-09-07 DIAGNOSIS — J301 Allergic rhinitis due to pollen: Secondary | ICD-10-CM | POA: Insufficient documentation

## 2016-09-07 DIAGNOSIS — J3089 Other allergic rhinitis: Secondary | ICD-10-CM | POA: Diagnosis not present

## 2016-09-07 DIAGNOSIS — J4540 Moderate persistent asthma, uncomplicated: Secondary | ICD-10-CM | POA: Diagnosis not present

## 2016-09-07 DIAGNOSIS — K2970 Gastritis, unspecified, without bleeding: Secondary | ICD-10-CM

## 2016-09-07 DIAGNOSIS — L2989 Other pruritus: Secondary | ICD-10-CM | POA: Insufficient documentation

## 2016-09-07 DIAGNOSIS — L298 Other pruritus: Secondary | ICD-10-CM | POA: Insufficient documentation

## 2016-09-07 MED ORDER — MONTELUKAST SODIUM 10 MG PO TABS: 10.0000 mg | ORAL_TABLET | Freq: Every day | ORAL | 5 refills | Status: DC

## 2016-09-07 MED ORDER — ALBUTEROL SULFATE HFA 108 (90 BASE) MCG/ACT IN AERS: 1.0000 | INHALATION_SPRAY | RESPIRATORY_TRACT | 2 refills | Status: DC | PRN

## 2016-09-07 MED ORDER — RANITIDINE HCL 150 MG PO TABS: 150.0000 mg | ORAL_TABLET | Freq: Two times a day (BID) | ORAL | 5 refills | Status: DC | PRN

## 2016-09-07 MED ORDER — LEVOCETIRIZINE DIHYDROCHLORIDE 5 MG PO TABS: 5.0000 mg | ORAL_TABLET | Freq: Every evening | ORAL | 5 refills | Status: DC

## 2016-09-07 MED ORDER — FLUTICASONE PROPIONATE 50 MCG/ACT NA SUSP: NASAL | 5 refills | Status: DC

## 2016-09-07 MED ORDER — FLUTICASONE PROPIONATE HFA 110 MCG/ACT IN AERO: 2.0000 | INHALATION_SPRAY | Freq: Two times a day (BID) | RESPIRATORY_TRACT | 5 refills | Status: DC

## 2016-09-07 NOTE — Patient Instructions (Addendum)
Generalized pruritus Unclear etiology. Skin tests to select food allergens were negative today. NSAIDs and emotional stress commonly exacerbate urticaria and/or pruritus but are not the underlying etiology in this case. There are no concomitant symptoms concerning for anaphylaxis. We will rule out other potential etiologies with labs. For symptom relief, patient is to take oral antihistamines as directed.  I have encouraged the patient to keep the skin moist with Aquaphor.  The following labs have been ordered: FCeRI antibody, TSH, anti-thyroglobulin antibody, thyroid peroxidase antibody, tryptase, urea breath test, CBC, CMP, and galactose-alpha-1,3-galactose IgE level.  The patient will be called with further recommendations after lab results have returned.  Instructions have been discussed and provided for H1/H2 receptor blockade with titration to find lowest effective dose.  A prescription has been provided for levocetirizine, 2.5 - 5mg  daily as needed.  A prescription has been provided for ranitidine 150 mg one times daily as needed.  Should significant or new symptoms occur, a journal is to be kept recording any foods eaten, beverages consumed, medications taken within a 6 hour period prior to the onset of symptoms, as well as record activities being performed, and environmental conditions. For any symptoms concerning for anaphylaxis, 911 is to be called immediately.  If laboratory results are unrevealing, neurology evaluation may be warranted.  Moderate persistent asthma Today's spirometry results, assessed while asymptomatic, suggest under-perception of bronchoconstriction.  For now, continue Floventt 110 g, 2 inhalations twice a day, and albuterol HFA, 1-2 inhalations every 4-6 hours as needed.  To maximize pulmonary deposition, a spacer has been provided along with instructions for its proper administration with an HFA inhaler.  A prescription has been provided for montelukast 10  mg daily at bedtime.  Subjective and objective measures of pulmonary function will be followed and the treatment plan will be adjusted accordingly.  Perennial and seasonal allergic rhinitis  Aeroallergen avoidance measures have been discussed and provided in written form.  Levocetirizine and montelukast have been prescribed (as above).  A prescription has been provided for fluticasone nasal spray, 1 spray per nostril 1 or 2 times daily as needed. Proper nasal spray technique has been discussed and demonstrated.  I have also recommended nasal saline spray (i.e. Simply Saline) as needed and prior to medicated nasal sprays.   When lab results have returned the patient will be called with further recommendations and follow up instructions.    Urticaria (Hives)  . Levocetirizine (Xyzal) 2.5 mg twice a day and ranitidine (Zantac) 150 mg twice a day. If no symptoms for 7-14 days then decrease to. . Levocetirizine (Xyzal) 2.5 mg twice a day and ranitidine (Zantac) 150 mg once a day.  If no symptoms for 7-14 days then decrease to. . Levocetirizine (Xyzal) 2.5 mg twice a day.  If no symptoms for 7-14 days then decrease to. . Levocetirizine (Xyzal) 2.5 mg once a day.  May use Benadryl (diphenhydramine) as needed for breakthrough symptoms       If symptoms return, then step up dosage  Reducing Pollen Exposure  The American Academy of Allergy, Asthma and Immunology suggests the following steps to reduce your exposure to pollen during allergy seasons.    1. Do not hang sheets or clothing out to dry; pollen may collect on these items. 2. Do not mow lawns or spend time around freshly cut grass; mowing stirs up pollen. 3. Keep windows closed at night.  Keep car windows closed while driving. 4. Minimize morning activities outdoors, a time when pollen counts are usually  at their highest. 5. Stay indoors as much as possible when pollen counts or humidity is high and on windy days when pollen tends  to remain in the air longer. 6. Use air conditioning when possible.  Many air conditioners have filters that trap the pollen spores. 7. Use a HEPA room air filter to remove pollen form the indoor air you breathe.   Control of Mold Allergen  Mold and fungi can grow on a variety of surfaces provided certain temperature and moisture conditions exist.  Outdoor molds grow on plants, decaying vegetation and soil.  The major outdoor mold, Alternaria and Cladosporium, are found in very high numbers during hot and dry conditions.  Generally, a late Summer - Fall peak is seen for common outdoor fungal spores.  Rain will temporarily lower outdoor mold spore count, but counts rise rapidly when the rainy period ends.  The most important indoor molds are Aspergillus and Penicillium.  Dark, humid and poorly ventilated basements are ideal sites for mold growth.  The next most common sites of mold growth are the bathroom and the kitchen.  Outdoor [employer] 1. Use air conditioning and keep windows closed 2. Avoid exposure to decaying vegetation. 3. Avoid leaf raking. 4. Avoid grain handling. 5. Consider wearing a face mask if working in moldy areas.  Indoor Mold Control 1. Maintain humidity below 50%. 2. Clean washable surfaces with 5% bleach solution. 3. Remove sources e.g. Contaminated carpets.

## 2016-09-07 NOTE — Progress Notes (Signed)
New Patient Note  RE: MARITHZA MALACHI MRN: [id number] DOB: 11-Mar-1933 Date of Office Visit: 09/07/2016  Referring provider: Elisabeth Cara, * Primary care provider: Elisabeth Cara, [place]  Chief Complaint: Pruritus and Asthma   History of present illness: Georjean Toya is a 83 y.o. female seen today in consultation requested by [place], PA-C. Over the past year she has experienced generalized pruritus as well as a stinging sensation that "feels like something is biting" her.  She does not believe that she has ever seen hives in association with the pruritus.  She does not experience concomitant angioedema, cardiopulmonary symptoms, or GI symptoms.  No specific medication, food, skin care product, detergent, soap, or other environmental triggers have been identified.  She admits that her skin gets dry.  She has tried cold bond lotion without benefit.  She has not tried antihistamines in an attempt to control the pruritus.   She was diagnosed with asthma approximately 15 years ago.  She takes Flovent 110 g, 2 inhalations twice a day.  She currently does not use a spacer device with her HFA inhalers.  She reports that she experiences wheezing "randomly", however is not clear how often she experiences lower respiratory symptoms.  She experiences nasal congestion, rhinorrhea, ocular pruritus, and occasional postnasal drainage and sinus pressure.  These symptoms occur year around but tend to occur more frequently during the springtime.   Assessment and plan: Generalized pruritus Unclear etiology. Skin tests to select food allergens were negative today. NSAIDs and emotional stress commonly exacerbate urticaria and/or pruritus but are not the underlying etiology in this case. There are no concomitant symptoms concerning for anaphylaxis. We will rule out other potential etiologies with labs. For symptom relief, patient is to take oral antihistamines as directed.  I have  encouraged the patient to keep the skin moist with Aquaphor.  The following labs have been ordered: FCeRI antibody, TSH, anti-thyroglobulin antibody, thyroid peroxidase antibody, tryptase, urea breath test, CBC, CMP, and galactose-alpha-1,3-galactose IgE level.  The patient will be called with further recommendations after lab results have returned.  Instructions have been discussed and provided for H1/H2 receptor blockade with titration to find lowest effective dose.  A prescription has been provided for levocetirizine, 2.5 - 5mg  daily as needed.  A prescription has been provided for ranitidine 150 mg one times daily as needed.  Should significant or new symptoms occur, a journal is to be kept recording any foods eaten, beverages consumed, medications taken within a 6 hour period prior to the onset of symptoms, as well as record activities being performed, and environmental conditions. For any symptoms concerning for anaphylaxis, 911 is to be called immediately.  If laboratory results are unrevealing, neurology evaluation may be warranted.  Moderate persistent asthma Today's spirometry results, assessed while asymptomatic, suggest under-perception of bronchoconstriction.  For now, continue Floventt 110 g, 2 inhalations twice a day, and albuterol HFA, 1-2 inhalations every 4-6 hours as needed.  To maximize pulmonary deposition, a spacer has been provided along with instructions for its proper administration with an HFA inhaler.  A prescription has been provided for montelukast 10 mg daily at bedtime.  Subjective and objective measures of pulmonary function will be followed and the treatment plan will be adjusted accordingly.  Perennial and seasonal allergic rhinitis  Aeroallergen avoidance measures have been discussed and provided in written form.  Levocetirizine and montelukast have been prescribed (as above).  A prescription has been provided for fluticasone nasal spray, 1 spray  per nostril 1 or 2 times daily as needed. Proper nasal spray technique has been discussed and demonstrated.  I have also recommended nasal saline spray (i.e. Simply Saline) as needed and prior to medicated nasal sprays.   Meds ordered this encounter  Medications  . levocetirizine (XYZAL) 5 MG tablet    Sig: Take 1 tablet (5 mg total) by mouth every evening.    Dispense:  30 tablet    Refill:  5  . ranitidine (ZANTAC) 150 MG tablet    Sig: Take 1 tablet (150 mg total) by mouth 2 (two) times daily as needed for heartburn.    Dispense:  60 tablet    Refill:  5  . montelukast (SINGULAIR) 10 MG tablet    Sig: Take 1 tablet (10 mg total) by mouth at bedtime.    Dispense:  30 tablet    Refill:  5  . fluticasone (FLOVENT HFA) 110 MCG/ACT inhaler    Sig: Inhale 2 puffs into the lungs 2 (two) times daily.    Dispense:  1 Inhaler    Refill:  5  . albuterol (PROVENTIL HFA;VENTOLIN HFA) 108 (90 Base) MCG/ACT inhaler    Sig: Inhale 1-2 puffs into the lungs every 4 (four) hours as needed for wheezing or shortness of breath.    Dispense:  1 Inhaler    Refill:  2  . fluticasone (FLONASE) 50 MCG/ACT nasal spray    Sig: Use 1 spray per nostril 1-2 times daily as needed    Dispense:  16 g    Refill:  5    Diagnostics: Spirometry: FVC was 1.18 L and FEV1 was 0.69 L (61% predicted) with significant (320 mL, 33%) postbronchodilator improvement. This study was performed while the patient was asymptomatic.  Please see scanned spirometry results for details. Epicutaneous testing: Positive to tree pollen and mold. Intradermal testing:  Negative. Food allergen skin testing:  Negative despite a positive histamine control.    Physical examination: Blood pressure 106/60, pulse 84, temperature 98.1 F (36.7 C), temperature source Oral, resp. rate 20, height 5' 4.17" (1.63 m), weight 155 lb 3.3 oz (70.4 kg), SpO2 96 %.  General: Alert, interactive, in no acute distress. HEENT: TMs pearly gray, turbinates  moderately edematous with thick discharge, post-pharynx unremarkable. Neck: Supple without lymphadenopathy. Lungs: Decreased breath sounds bilaterally without wheezing, rhonchi or rales. CV: Normal S1, S2 without murmurs. Abdomen: Nondistended, nontender. Skin: Warm and dry, without lesions or rashes. Extremities:  No clubbing, cyanosis or edema. Neuro:   Grossly intact.  Review of systems:  Review of systems negative except as noted in HPI / PMHx or noted below: Review of Systems  Constitutional: Negative.   HENT: Negative.   Eyes: Negative.   Respiratory: Negative.   Cardiovascular: Negative.   Gastrointestinal: Negative.   Genitourinary: Negative.   Musculoskeletal: Negative.   Skin: Negative.   Neurological: Negative.   Endo/Heme/Allergies: Negative.   Psychiatric/Behavioral: Negative.     Past medical history:  Past Medical History:  Diagnosis Date  . Arthritis    "all over"  . Atrial fibrillation ([place])    Diagnosed ~2009  . Breast cancer, left breast ([place]) 1998   S/P chemo and mastectomy   . GERD (gastroesophageal reflux disease)   . Hyperlipidemia   . Hypertension   . Left shoulder pain    "MRI showed pinched nerve" - per pt  . OSA on CPAP   . Pneumonia    "4-5 times" (01/06/2015)  . Type II diabetes mellitus ([place])  Past surgical history:  Past Surgical History:  Procedure Laterality Date  . BREAST BIOPSY Left 1998  . CATARACT EXTRACTION W/ INTRAOCULAR LENS  IMPLANT, BILATERAL Bilateral   . JOINT REPLACEMENT    . LAPAROSCOPIC CHOLECYSTECTOMY    . MASTECTOMY Left 1998  . TOTAL KNEE ARTHROPLASTY Left 2001  . TOTAL KNEE REVISION Left 01/28/2013   Procedure: LEFT TOTAL KNEE ARTHROPLASTY REVISION;  Surgeon: Marianna Payment, MD;  Location: [facility];  Service: Orthopedics;  Laterality: Left;    Family history: Family History  Problem Relation Age of Onset  . Heart attack Mother        39s  . Emphysema Brother   . Allergic rhinitis Neg Hx   .  Angioedema Neg Hx   . Asthma Neg Hx   . Eczema Neg Hx   . Immunodeficiency Neg Hx   . Urticaria Neg Hx     Social history: Social History   Social History  . Marital status: Widowed    Spouse name: N/A  . Number of children: N/A  . Years of education: N/A   Occupational History  . Not on file.   Social History Main Topics  . Smoking status: Never Smoker  . Smokeless tobacco: Current User    Types: Snuff  . Alcohol use Yes     Comment: 01/06/2015 "I'll have a beer q once in awhile"  . Drug use: No  . Sexual activity: No   Other Topics Concern  . Not on file   Social History Narrative   Moved from the [place] area to [place] ~ 2yrs ago to live with her daughter   Environmental History: The patient lives in a house with carpeting in the bedroom, [place], and central air.  There is a dog in the home which does not have access to her bedroom.  There is no known mold/water damage in the house.  She is a nonsmoker.  Allergies as of 09/07/2016      Reactions   Chocolate    Cough   Lisinopril Other (See Comments)   Peanuts [peanut Oil] Cough, Other (See Comments)      Medication List       Accurate as of 09/07/16  1:21 PM. Always use your most recent med list.          albuterol 108 (90 Base) MCG/ACT inhaler Commonly known as:  PROVENTIL HFA;VENTOLIN HFA Inhale 1-2 puffs into the lungs every 4 (four) hours as needed for wheezing or shortness of breath.   ALPRAZolam 0.5 MG tablet Commonly known as:  XANAX Take by mouth.   amLODipine 10 MG tablet Commonly known as:  NORVASC Take 10 mg by mouth.   aspirin 325 MG tablet Take by mouth.   CARDIZEM CD 120 MG 24 hr capsule Generic drug:  diltiazem Take 120 mg by mouth daily.   feeding supplement (ENSURE ENLIVE) Liqd Take 237 mLs by mouth 2 (two) times daily between meals.   fluticasone 110 MCG/ACT inhaler Commonly known as:  FLOVENT HFA Inhale 2 puffs into the lungs 2 (two) times daily.   fluticasone 50  MCG/ACT nasal spray Commonly known as:  FLONASE Use 1 spray per nostril 1-2 times daily as needed   furosemide 20 MG tablet Commonly known as:  LASIX Take 20 mg by mouth.   gi cocktail Susp suspension Take 30 mLs by mouth 4 (four) times daily as needed for indigestion (or chest pain). Shake well.   levocetirizine 5 MG tablet Commonly known  asHarlow Ohms Take 1 tablet (5 mg total) by mouth every evening.   lisinopril 10 MG tablet Commonly known as:  PRINIVIL,ZESTRIL Take 10 mg by mouth daily.   metFORMIN 500 MG tablet Commonly known as:  GLUCOPHAGE Take 500 mg by mouth.   montelukast 10 MG tablet Commonly known as:  SINGULAIR Take 1 tablet (10 mg total) by mouth at bedtime.   pantoprazole 40 MG tablet Commonly known as:  PROTONIX Take 1 tablet (40 mg total) by mouth daily.   ranitidine 150 MG tablet Commonly known as:  ZANTAC Take 1 tablet (150 mg total) by mouth 2 (two) times daily as needed for heartburn.   rivaroxaban 20 MG Tabs tablet Commonly known as:  XARELTO Take 20 mg by mouth every morning.   Vitamin D 2000 units tablet Frequency:daily   Dosage:2000   UNIT  Instructions:D3 Dots (2000UNIT TBDP, Oral daily)  Note:       Known medication allergies: Allergies  Allergen Reactions  . Chocolate     Cough   . Lisinopril Other (See Comments)  . Peanuts [Peanut Oil] Cough and Other (See Comments)    I appreciate the opportunity to take part in [place] care. Please do not hesitate to contact me with questions.  Sincerely,   R. Edgar Frisk, MD

## 2016-09-07 NOTE — Assessment & Plan Note (Deleted)
   For now, continue low-fat 110 g, 2 inhalations twice a day, and albuterol HFA, 1-2 inhalations every 4-6 hours as needed.  To maximize pulmonary deposition, a spacer has been provided along with instructions for its proper administration with an HFA inhaler.  Subjective and objective measures of pulmonary function will be followed and the treatment plan will be adjusted accordingly.

## 2016-09-07 NOTE — Assessment & Plan Note (Signed)
   Aeroallergen avoidance measures have been discussed and provided in written form.  Levocetirizine and montelukast have been prescribed (as above).  A prescription has been provided for fluticasone nasal spray, 1 spray per nostril 1 or 2 times daily as needed. Proper nasal spray technique has been discussed and demonstrated.  I have also recommended nasal saline spray (i.e. Simply Saline) as needed and prior to medicated nasal sprays.

## 2016-09-07 NOTE — Assessment & Plan Note (Addendum)
Today's spirometry results, assessed while asymptomatic, suggest under-perception of bronchoconstriction.  For now, continue Floventt 110 g, 2 inhalations twice a day, and albuterol HFA, 1-2 inhalations every 4-6 hours as needed.  To maximize pulmonary deposition, a spacer has been provided along with instructions for its proper administration with an HFA inhaler.  A prescription has been provided for montelukast 10 mg daily at bedtime.  Subjective and objective measures of pulmonary function will be followed and the treatment plan will be adjusted accordingly.

## 2016-09-07 NOTE — Assessment & Plan Note (Addendum)
Unclear etiology. Skin tests to select food allergens were negative today. NSAIDs and emotional stress commonly exacerbate urticaria and/or pruritus but are not the underlying etiology in this case. There are no concomitant symptoms concerning for anaphylaxis. We will rule out other potential etiologies with labs. For symptom relief, patient is to take oral antihistamines as directed.  I have encouraged the patient to keep the skin moist with Aquaphor.  The following labs have been ordered: FCeRI antibody, TSH, anti-thyroglobulin antibody, thyroid peroxidase antibody, tryptase, urea breath test, CBC, CMP, and galactose-alpha-1,3-galactose IgE level.  The patient will be called with further recommendations after lab results have returned.  Instructions have been discussed and provided for H1/H2 receptor blockade with titration to find lowest effective dose.  A prescription has been provided for levocetirizine, 2.5 - 5mg  daily as needed.  A prescription has been provided for ranitidine 150 mg one times daily as needed.  Should significant or new symptoms occur, a journal is to be kept recording any foods eaten, beverages consumed, medications taken within a 6 hour period prior to the onset of symptoms, as well as record activities being performed, and environmental conditions. For any symptoms concerning for anaphylaxis, 911 is to be called immediately.  If laboratory results are unrevealing, neurology evaluation may be warranted.

## 2016-09-08 LAB — CBC WITH DIFFERENTIAL/PLATELET
Basophils Absolute: 0 cells/uL (ref 0–200)
Basophils Relative: 0 %
Eosinophils Absolute: 110 cells/uL (ref 15–500)
Eosinophils Relative: 2 %
HCT: 35.6 % (ref 35.0–45.0)
Hemoglobin: 11.5 g/dL — ABNORMAL LOW (ref 11.7–15.5)
Lymphocytes Relative: 31 %
Lymphs Abs: 1705 cells/uL (ref 850–3900)
MCH: 29.3 pg (ref 27.0–33.0)
MCHC: 32.3 g/dL (ref 32.0–36.0)
MCV: 90.8 fL (ref 80.0–100.0)
MPV: 8.9 fL (ref 7.5–12.5)
Monocytes Absolute: 660 cells/uL (ref 200–950)
Monocytes Relative: 12 %
Neutro Abs: 3025 cells/uL (ref 1500–7800)
Neutrophils Relative %: 55 %
Platelets: 329 10*3/uL (ref 140–400)
RBC: 3.92 MIL/uL (ref 3.80–5.10)
RDW: 17 % — ABNORMAL HIGH (ref 11.0–15.0)
WBC: 5.5 10*3/uL (ref 3.8–10.8)

## 2016-09-09 LAB — COMPREHENSIVE METABOLIC PANEL
ALT: 10 U/L (ref 6–29)
AST: 20 U/L (ref 10–35)
Albumin: 4 g/dL (ref 3.6–5.1)
Alkaline Phosphatase: 66 U/L (ref 33–130)
BUN: 11 mg/dL (ref 7–25)
CO2: 22 mmol/L (ref 20–31)
Calcium: 9.3 mg/dL (ref 8.6–10.4)
Chloride: 104 mmol/L (ref 98–110)
Creat: 0.89 mg/dL — ABNORMAL HIGH (ref 0.60–0.88)
Glucose, Bld: 103 mg/dL — ABNORMAL HIGH (ref 65–99)
Potassium: 4 mmol/L (ref 3.5–5.3)
Sodium: 139 mmol/L (ref 135–146)
Total Bilirubin: 0.9 mg/dL (ref 0.2–1.2)
Total Protein: 7.6 g/dL (ref 6.1–8.1)

## 2016-09-11 LAB — H. PYLORI BREATH TEST: H. pylori Breath Test: NOT DETECTED

## 2016-09-12 LAB — TRYPTASE: Tryptase: 6 ug/L (ref <11)

## 2016-09-13 LAB — ALPHA-GAL PANEL
Beef IgE: 0.25 kU/L (ref <0.35)
Galactose-alpha-1,3-galactose IgE*: 0.49 kU/L — ABNORMAL HIGH (ref <0.35)
Lamb/Mutton IgE: 0.11 kU/L (ref <0.35)
Pork IgE: 0.13 kU/L (ref <0.35)

## 2016-09-13 LAB — CP CHRONIC URTICARIA INDEX PANEL
Histamine Release: <16 % (ref <16)
TSH: 1.2 mIU/L
Thyroglobulin Ab: <1 IU/mL (ref <2)
Thyroperoxidase Ab SerPl-aCnc: 4 IU/mL (ref <9)

## 2017-03-14 ENCOUNTER — Other Ambulatory Visit: Payer: Self-pay | Admitting: Allergy and Immunology

## 2017-03-14 DIAGNOSIS — L298 Other pruritus: Secondary | ICD-10-CM

## 2017-03-14 DIAGNOSIS — L500 Allergic urticaria: Secondary | ICD-10-CM

## 2017-03-14 DIAGNOSIS — J3089 Other allergic rhinitis: Secondary | ICD-10-CM

## 2017-03-14 DIAGNOSIS — J4540 Moderate persistent asthma, uncomplicated: Secondary | ICD-10-CM

## 2017-03-14 NOTE — Telephone Encounter (Signed)
Courtesy refill on Xyzal and Singulair x 1 with no refills. Pt needs an OV

## 2017-03-21 ENCOUNTER — Other Ambulatory Visit: Payer: Self-pay | Admitting: Allergy and Immunology

## 2017-03-21 DIAGNOSIS — J4540 Moderate persistent asthma, uncomplicated: Secondary | ICD-10-CM

## 2017-03-21 NOTE — Telephone Encounter (Signed)
Courtesy refill  

## 2017-04-14 ENCOUNTER — Other Ambulatory Visit: Payer: Self-pay | Admitting: Allergy and Immunology

## 2017-04-14 DIAGNOSIS — J3089 Other allergic rhinitis: Secondary | ICD-10-CM

## 2017-04-14 DIAGNOSIS — L500 Allergic urticaria: Secondary | ICD-10-CM

## 2017-04-14 DIAGNOSIS — L298 Other pruritus: Secondary | ICD-10-CM

## 2017-04-14 DIAGNOSIS — J4540 Moderate persistent asthma, uncomplicated: Secondary | ICD-10-CM

## 2017-04-20 ENCOUNTER — Other Ambulatory Visit: Payer: Self-pay | Admitting: Allergy and Immunology

## 2017-04-20 DIAGNOSIS — J4540 Moderate persistent asthma, uncomplicated: Secondary | ICD-10-CM

## 2017-04-23 ENCOUNTER — Other Ambulatory Visit: Payer: Self-pay | Admitting: *Deleted

## 2017-04-23 DIAGNOSIS — J4540 Moderate persistent asthma, uncomplicated: Secondary | ICD-10-CM

## 2017-04-23 DIAGNOSIS — J3089 Other allergic rhinitis: Secondary | ICD-10-CM

## 2017-08-17 DIAGNOSIS — E1165 Type 2 diabetes mellitus with hyperglycemia: Secondary | ICD-10-CM | POA: Insufficient documentation

## 2017-08-21 ENCOUNTER — Encounter (INDEPENDENT_AMBULATORY_CARE_PROVIDER_SITE_OTHER): Payer: Self-pay | Admitting: Orthopaedic Surgery

## 2017-08-21 ENCOUNTER — Ambulatory Visit (INDEPENDENT_AMBULATORY_CARE_PROVIDER_SITE_OTHER): Payer: Medicare Other | Admitting: Orthopaedic Surgery

## 2017-08-21 DIAGNOSIS — M19071 Primary osteoarthritis, right ankle and foot: Secondary | ICD-10-CM | POA: Diagnosis not present

## 2017-08-21 NOTE — Progress Notes (Signed)
Office Visit Note   Patient: [name]           Date of Birth: 22-Apr-1933           MRN: [id number] Visit Date: 08/21/2017              Requested by: [place], [place], [place] [address] [place] [place], [place] [address] PCP: Belva Bertin, [place], [place]   Assessment & Plan: Visit Diagnoses:  1. Arthritis of right ankle     Plan: Impression is severe osteoarthritis to the right ankle tibiotalar joint as well as peroneal tendinitis.  We will proceed with the right ankle cortisone injection today.  If she does not benefit from this, she will follow-up in the next 2 to 3 weeks and we will place her in a cam walker to try and rest her peroneal tendon.  She will follow-up with [place] as needed otherwise.  Follow-Up Instructions: Return if symptoms worsen or fail to improve.   Orders:  No orders of the defined types were placed in this encounter.  No orders of the defined types were placed in this encounter.     Procedures: No procedures performed   Clinical Data: No additional findings.   Subjective: Chief Complaint  Patient presents with  . Right Ankle - Pain    HPI patient is a pleasant 84 year old female who presents to our clinic today with right ankle pain.  This began a few years back with no known injury or change in activity.  She does note a remote distal tibia fracture approximately 30 years ago.  The pain she has recently been having has progressively worsened.  All of her pain is lateral aspect.  Pain is worse with walking.  She also has pain at night that does occasionally wake her from her sleep.  She has associated swelling as well.  She has been taking Tylenol with minimal relief of symptoms.  She does note occasional paresthesias to both feet but is recently been placed on medication for this by her PCP which has seemed to somewhat help.  She also notes a remote history of what sounds like a cortisone injection to the right ankle with significant  relief of symptoms.  She was recently seen by her PCP where x-rays and MRI of the right ankle were ordered.  These showed severe osteoarthritis to the right tibiotalar joint.  Review of Systems as detailed in HPI.  All others reviewed and are negative.   Objective: Vital Signs: There were no vitals taken for this visit.  Physical Exam well-developed well-nourished female in no acute distress.  Alert and oriented x3.  Ortho Exam examination of the right ankle reveals mild swelling.  Moderate tenderness along the peroneal tendon.  Limited dorsiflexion.  Pain with plantar flexion as well as inversion of the ankle.  Pain with resisted inversion of the ankle.  Minimal pain to the Achilles bursa.  She is neurovascularly intact distally.  Specialty Comments:  No specialty comments available.  Imaging: No new imaging   PMFS History: Patient Active Problem List   Diagnosis Date Noted  . Arthritis of right ankle 08/21/2017  . Allergic urticaria 09/07/2016  . Generalized pruritus 09/07/2016  . Moderate persistent asthma 09/07/2016  . Perennial and seasonal allergic rhinitis 09/07/2016  . Chest pain 01/06/2015  . Angina at rest [facility]) 01/06/2015  . Diabetes ([place]) 01/06/2015  . A-fib ([place]) 01/06/2015  . HLD (hyperlipidemia) 01/06/2015  . Tobacco abuse 01/06/2015  .  Diabetes mellitus with complication ([place])   . Painful total knee replacement ([place]) 01/28/2013  . Atrial fibrillation ([place])   . Left shoulder pain   . Hypertension    Past Medical History:  Diagnosis Date  . Arthritis    "all over"  . Atrial fibrillation ([facility])    Diagnosed ~2009  . Breast cancer, left breast ([place]) 1998   S/P chemo and mastectomy   . GERD (gastroesophageal reflux disease)   . Hyperlipidemia   . Hypertension   . Left shoulder pain    "MRI showed pinched nerve" - per pt  . OSA on CPAP   . Pneumonia    "4-5 times" (01/06/2015)  . Type II diabetes mellitus ([facility])     Family History  Problem Relation  Age of Onset  . Heart attack Mother        58s  . Emphysema Brother   . Allergic rhinitis Neg Hx   . Angioedema Neg Hx   . Asthma Neg Hx   . Eczema Neg Hx   . Immunodeficiency Neg Hx   . Urticaria Neg Hx     Past Surgical History:  Procedure Laterality Date  . BREAST BIOPSY Left 1998  . CATARACT EXTRACTION W/ INTRAOCULAR LENS  IMPLANT, BILATERAL Bilateral   . JOINT REPLACEMENT    . LAPAROSCOPIC CHOLECYSTECTOMY    . MASTECTOMY Left 1998  . TOTAL KNEE ARTHROPLASTY Left 2001  . TOTAL KNEE REVISION Left 01/28/2013   Procedure: LEFT TOTAL KNEE ARTHROPLASTY REVISION;  Surgeon: Marianna Payment, MD;  Location: [facility];  Service: Orthopedics;  Laterality: Left;   Social History   Occupational History  . Not on file  Tobacco Use  . Smoking status: Never Smoker  . Smokeless tobacco: Current User    Types: Snuff  Substance and Sexual Activity  . Alcohol use: Yes    Comment: 01/06/2015 "I'll have a beer q once in awhile"  . Drug use: No  . Sexual activity: Never

## 2017-09-28 DIAGNOSIS — K5904 Chronic idiopathic constipation: Secondary | ICD-10-CM | POA: Insufficient documentation

## 2017-09-28 DIAGNOSIS — M129 Arthropathy, unspecified: Secondary | ICD-10-CM | POA: Insufficient documentation

## 2018-01-24 ENCOUNTER — Encounter (INDEPENDENT_AMBULATORY_CARE_PROVIDER_SITE_OTHER): Payer: Self-pay | Admitting: Orthopaedic Surgery

## 2018-01-24 ENCOUNTER — Ambulatory Visit (INDEPENDENT_AMBULATORY_CARE_PROVIDER_SITE_OTHER): Payer: Medicare Other | Admitting: Orthopaedic Surgery

## 2018-01-24 DIAGNOSIS — M19071 Primary osteoarthritis, right ankle and foot: Secondary | ICD-10-CM | POA: Insufficient documentation

## 2018-01-24 MED ORDER — LIDOCAINE HCL 1 % IJ SOLN
1.0000 mL | INTRAMUSCULAR | Status: AC | PRN
  Administered 2018-01-24: 1 mL

## 2018-01-24 MED ORDER — BUPIVACAINE HCL 0.25 % IJ SOLN
0.6600 mL | INTRAMUSCULAR | Status: AC | PRN
  Administered 2018-01-24: .66 mL via INTRA_ARTICULAR

## 2018-01-24 MED ORDER — METHYLPREDNISOLONE ACETATE 40 MG/ML IJ SUSP
13.3300 mg | INTRAMUSCULAR | Status: AC | PRN
  Administered 2018-01-24: 13.33 mg via INTRA_ARTICULAR

## 2018-01-24 NOTE — Progress Notes (Signed)
Office Visit Note   Patient: [name]           Date of Birth: 07-Jul-1933           MRN: [id number] Visit Date: 01/24/2018              Requested by: Elisabeth Cara, [address] [place], [place] [address] PCP: Belva Bertin, [place], [place]   Assessment & Plan: Visit Diagnoses:  1. Primary osteoarthritis, right ankle and foot     Plan: Impression is right ankle tibiotalar osteoarthritis.  We will reinject this with cortisone today as she has significant relief from the previous injection.  She will follow-up with [place] as needed.  Follow-Up Instructions: Return if symptoms worsen or fail to improve.   Orders:  Orders Placed This Encounter  Procedures  . Medium Joint Inj: R ankle   No orders of the defined types were placed in this encounter.     Procedures: Medium Joint Inj: R ankle on 01/24/2018 9:24 AM Indications: pain Medications: 1 mL lidocaine 1 %; 0.66 mL bupivacaine 0.25 %; 13.33 mg methylPREDNISolone acetate 40 MG/ML      Clinical Data: No additional findings.   Subjective: Chief Complaint  Patient presents with  . Right Foot - Pain    HPI patient is a pleasant 84 year old female presents to our clinic today with recurrent right ankle pain.  History of tibiotalar arthritis.  She was seen in our office mid July 2019.  I injected her right ankle joint with cortisone.  This significantly helped until about 3 weeks ago.  Her pain has started to return.  The majority of her pain is to the anterior aspect of her foot worse with dorsiflexion.  She has increased pain trying to walk as well.  Previous MRI from 2018 showed severe tibiotalar osteoarthritis.  Review of Systems as detailed in HPI.  All others reviewed and are negative.   Objective: Vital Signs: There were no vitals taken for this visit.  Physical Exam well-developed well-nourished female no acute distress.  Alert and oriented x3.  Ortho Exam examination of her  right ankle shows slight swelling.  Mild tenderness throughout the anterior aspect.  Increased pain with dorsiflexion.  She is neurovascularly intact distally.  Specialty Comments:  No specialty comments available.  Imaging: No new imaging   PMFS History: Patient Active Problem List   Diagnosis Date Noted  . Primary osteoarthritis, right ankle and foot 01/24/2018  . Arthritis of right ankle 08/21/2017  . Allergic urticaria 09/07/2016  . Generalized pruritus 09/07/2016  . Moderate persistent asthma 09/07/2016  . Perennial and seasonal allergic rhinitis 09/07/2016  . Chest pain 01/06/2015  . Angina at rest [facility]) 01/06/2015  . Diabetes ([place]) 01/06/2015  . A-fib ([place]) 01/06/2015  . HLD (hyperlipidemia) 01/06/2015  . Tobacco abuse 01/06/2015  . Diabetes mellitus with complication ([place])   . Painful total knee replacement ([place]) 01/28/2013  . Atrial fibrillation ([place])   . Left shoulder pain   . Hypertension    Past Medical History:  Diagnosis Date  . Arthritis    "all over"  . Atrial fibrillation ([place])    Diagnosed ~2009  . Breast cancer, left breast ([place]) 1998   S/P chemo and mastectomy   . GERD (gastroesophageal reflux disease)   . Hyperlipidemia   . Hypertension   . Left shoulder pain    "MRI showed pinched nerve" - per pt  . OSA on CPAP   . Pneumonia    "  4-5 times" (01/06/2015)  . Type II diabetes mellitus ([facility])     Family History  Problem Relation Age of Onset  . Heart attack Mother        66s  . Emphysema Brother   . Allergic rhinitis Neg Hx   . Angioedema Neg Hx   . Asthma Neg Hx   . Eczema Neg Hx   . Immunodeficiency Neg Hx   . Urticaria Neg Hx     Past Surgical History:  Procedure Laterality Date  . BREAST BIOPSY Left 1998  . CATARACT EXTRACTION W/ INTRAOCULAR LENS  IMPLANT, BILATERAL Bilateral   . JOINT REPLACEMENT    . LAPAROSCOPIC CHOLECYSTECTOMY    . MASTECTOMY Left 1998  . TOTAL KNEE ARTHROPLASTY Left 2001  . TOTAL KNEE REVISION Left  01/28/2013   Procedure: LEFT TOTAL KNEE ARTHROPLASTY REVISION;  Surgeon: Marianna Payment, MD;  Location: [facility];  Service: Orthopedics;  Laterality: Left;   Social History   Occupational History  . Not on file  Tobacco Use  . Smoking status: Never Smoker  . Smokeless tobacco: Current User    Types: Snuff  Substance and Sexual Activity  . Alcohol use: Yes    Comment: 01/06/2015 "I'll have a beer q once in awhile"  . Drug use: No  . Sexual activity: Never

## 2018-01-27 ENCOUNTER — Encounter (HOSPITAL_BASED_OUTPATIENT_CLINIC_OR_DEPARTMENT_OTHER): Payer: Self-pay

## 2018-01-27 ENCOUNTER — Emergency Department (HOSPITAL_BASED_OUTPATIENT_CLINIC_OR_DEPARTMENT_OTHER): Payer: Medicare Other

## 2018-01-27 ENCOUNTER — Emergency Department (HOSPITAL_BASED_OUTPATIENT_CLINIC_OR_DEPARTMENT_OTHER)
Admission: EM | Admit: 2018-01-27 | Discharge: 2018-01-27 | Disposition: A | Discharge status: Home / Self Care | Payer: Medicare Other | Attending: Emergency Medicine | Admitting: Emergency Medicine

## 2018-01-27 ENCOUNTER — Other Ambulatory Visit: Payer: Self-pay

## 2018-01-27 DIAGNOSIS — M79603 Pain in arm, unspecified: Secondary | ICD-10-CM | POA: Diagnosis present

## 2018-01-27 DIAGNOSIS — Z853 Personal history of malignant neoplasm of breast: Secondary | ICD-10-CM | POA: Diagnosis not present

## 2018-01-27 DIAGNOSIS — Z96652 Presence of left artificial knee joint: Secondary | ICD-10-CM | POA: Insufficient documentation

## 2018-01-27 DIAGNOSIS — E119 Type 2 diabetes mellitus without complications: Secondary | ICD-10-CM | POA: Insufficient documentation

## 2018-01-27 DIAGNOSIS — R0602 Shortness of breath: Secondary | ICD-10-CM

## 2018-01-27 DIAGNOSIS — I10 Essential (primary) hypertension: Secondary | ICD-10-CM | POA: Insufficient documentation

## 2018-01-27 DIAGNOSIS — J189 Pneumonia, unspecified organism: Secondary | ICD-10-CM | POA: Diagnosis not present

## 2018-01-27 LAB — BASIC METABOLIC PANEL
ANION GAP: 8 (ref 5–15)
BUN: 14 mg/dL (ref 8–23)
CALCIUM: 9.4 mg/dL (ref 8.9–10.3)
CO2: 26 mmol/L (ref 22–32)
Chloride: 103 mmol/L (ref 98–111)
Creatinine, Ser: 0.86 mg/dL (ref 0.44–1.00)
GFR calc non Af Amer: >60 mL/min (ref >60)
Glucose, Bld: 104 mg/dL — ABNORMAL HIGH (ref 70–99)
Potassium: 3.9 mmol/L (ref 3.5–5.1)
Sodium: 137 mmol/L (ref 135–145)

## 2018-01-27 LAB — URINALYSIS, ROUTINE W REFLEX MICROSCOPIC
Bilirubin Urine: NEGATIVE
Glucose, UA: NEGATIVE mg/dL
Hgb urine dipstick: NEGATIVE
Ketones, ur: NEGATIVE mg/dL
LEUKOCYTES UA: NEGATIVE
NITRITE: NEGATIVE
Protein, ur: 100 mg/dL — AB
Specific Gravity, Urine: 1.02 (ref 1.005–1.030)
pH: 7 (ref 5.0–8.0)

## 2018-01-27 LAB — URINALYSIS, MICROSCOPIC (REFLEX): Bacteria, UA: NONE SEEN

## 2018-01-27 LAB — CBC
HCT: 36.6 % (ref 36.0–46.0)
Hemoglobin: 11.3 g/dL — ABNORMAL LOW (ref 12.0–15.0)
MCH: 28.3 pg (ref 26.0–34.0)
MCHC: 30.9 g/dL (ref 30.0–36.0)
MCV: 91.7 fL (ref 80.0–100.0)
Platelets: 325 10*3/uL (ref 150–400)
RBC: 3.99 MIL/uL (ref 3.87–5.11)
RDW: 13.9 % (ref 11.5–15.5)
WBC: 12.4 10*3/uL — ABNORMAL HIGH (ref 4.0–10.5)
nRBC: 0 % (ref 0.0–0.2)

## 2018-01-27 LAB — BRAIN NATRIURETIC PEPTIDE: B Natriuretic Peptide: 159.8 pg/mL — ABNORMAL HIGH (ref 0.0–100.0)

## 2018-01-27 LAB — TROPONIN I: Troponin I: <0.03 ng/mL (ref <0.03)

## 2018-01-27 MED ORDER — AZITHROMYCIN 250 MG PO TABS: ORAL_TABLET | ORAL | 0 refills | Status: DC

## 2018-01-27 NOTE — ED Triage Notes (Signed)
Pt was seen by PCP on Tuesday for a check up and had a CXR. Pt was notified yesterday that she has "fluid around my heart." Pt has been having L shoulder pain, and a cough. Pt states the L shoulder pain became worse today. Pt is supposed to have a cardiology appointment tomorrow.

## 2018-01-27 NOTE — Discharge Instructions (Addendum)
You were evaluated in the Emergency Department and after careful evaluation, we did not find any emergent condition requiring admission or further testing in the hospital.  Your x-ray today showed some evidence of pneumonia.  Please take the antibiotic as directed.  Your labs did show some evidence that your heart is not pumping as strong as it used to.  Please keep your follow-up with your primary care doctor tomorrow to discuss management.  Please return to the Emergency Department if you experience any worsening of your condition.  We encourage you to follow up with a primary care provider.  Thank you for allowing [place] to be a part of your care.

## 2018-01-27 NOTE — ED Provider Notes (Signed)
[facility] Emergency Department Provider Note MRN:  [id number]  Arrival date & time: 01/27/18     Chief Complaint   Arm Pain   History of Present Illness   [name] is a 84 y.o. year-old female with a history of A. fib, hypertension, diabetes presenting to the ED with chief complaint of arm pain.  3 weeks of bilateral arm pain, worse at night when she is trying to sleep, at times wakes up with paresthesias to bilateral hands.  Pain has become worse in the left arm, located more in the forearm and hand, worse with motion.  Denies chest pain.  For 2 to 3 weeks patient also endorsing dyspnea on exertion, decreased energy.  For several days endorsing dry cough.  Denies headache or vision change, no abdominal pain.  Patient had a chest x-ray performed as an outpatient, was told yesterday that she had "fluid around the heart".  Has a scheduled follow-up tomorrow, but here this evening because she was worried about this chest x-ray and she continued to have left arm pain.  Review of Systems  A complete 10 system review of systems was obtained and all systems are negative except as noted in the HPI and PMH.   Patient's Health History    Past Medical History:  Diagnosis Date  . Arthritis    "all over"  . Atrial fibrillation ([place])    Diagnosed ~2009  . Breast cancer, left breast ([place]) 1998   S/P chemo and mastectomy   . GERD (gastroesophageal reflux disease)   . Hyperlipidemia   . Hypertension   . Left shoulder pain    "MRI showed pinched nerve" - per pt  . OSA on CPAP   . Pneumonia    "4-5 times" (01/06/2015)  . Type II diabetes mellitus ([place])     Past Surgical History:  Procedure Laterality Date  . BREAST BIOPSY Left 1998  . CATARACT EXTRACTION W/ INTRAOCULAR LENS  IMPLANT, BILATERAL Bilateral   . JOINT REPLACEMENT    . LAPAROSCOPIC CHOLECYSTECTOMY    . MASTECTOMY Left 1998  . TOTAL KNEE ARTHROPLASTY Left 2001  . TOTAL KNEE REVISION Left  01/28/2013   Procedure: LEFT TOTAL KNEE ARTHROPLASTY REVISION;  Surgeon: Marianna Payment, MD;  Location: [place];  Service: Orthopedics;  Laterality: Left;    Family History  Problem Relation Age of Onset  . Heart attack Mother        41s  . Emphysema Brother   . Allergic rhinitis Neg Hx   . Angioedema Neg Hx   . Asthma Neg Hx   . Eczema Neg Hx   . Immunodeficiency Neg Hx   . Urticaria Neg Hx     Social History   Socioeconomic History  . Marital status: Widowed    Spouse name: Not on file  . Number of children: Not on file  . Years of education: Not on file  . Highest education level: Not on file  Occupational History  . Not on file  Social Needs  . Financial resource strain: Not on file  . Food insecurity:    Worry: Not on file    Inability: Not on file  . Transportation needs:    Medical: Not on file    Non-medical: Not on file  Tobacco Use  . Smoking status: Never Smoker  . Smokeless tobacco: Current User    Types: Snuff  Substance and Sexual Activity  . Alcohol use: Not Currently    Comment:  01/06/2015 "I'll have a beer q once in awhile"  . Drug use: No  . Sexual activity: Never  Lifestyle  . Physical activity:    Days per week: Not on file    Minutes per session: Not on file  . Stress: Not on file  Relationships  . Social connections:    Talks on phone: Not on file    Gets together: Not on file    Attends religious service: Not on file    Active member of club or organization: Not on file    Attends meetings of clubs or organizations: Not on file    Relationship status: Not on file  . Intimate partner violence:    Fear of current or ex partner: Not on file    Emotionally abused: Not on file    Physically abused: Not on file    Forced sexual activity: Not on file  Other Topics Concern  . Not on file  Social History Narrative   Moved from the [place] area to [place] ~ 52yrs ago to live with her daughter     Physical Exam  Vital Signs and  Nursing Notes reviewed Vitals:   01/27/18 1923 01/27/18 2041  BP: (!) 161/91 125/87  Pulse: 82 89  Resp: 20 20  Temp: 99.1 F (37.3 C) 98.5 F (36.9 C)  SpO2: 97% 98%    CONSTITUTIONAL: Well-appearing, NAD NEURO:  Alert and oriented x 3, no focal deficits EYES:  eyes equal and reactive ENT/NECK:  no LAD, no JVD CARDIO: Regular rate, well-perfused, normal S1 and S2 PULM:  CTAB no wheezing or rhonchi GI/GU:  normal bowel sounds, non-distended, non-tender MSK/SPINE:  No gross deformities, no edema SKIN:  no rash, atraumatic PSYCH:  Appropriate speech and behavior  Diagnostic and Interventional Summary    EKG Interpretation  Date/Time:  Sunday January 27 2018 19:18:42 EST Ventricular Rate:  101 PR Interval:    QRS Duration: 82 QT Interval:  352 QTC Calculation: 456 R Axis:   131 Text Interpretation:  Atrial fibrillation with rapid ventricular response Right axis deviation Septal infarct , age undetermined Abnormal ECG Confirmed by Gerlene Fee [phone]) on 01/27/2018 7:39:36 PM      Labs Reviewed  CBC - Abnormal; Notable for the following components:      Result Value   WBC 12.4 (*)    Hemoglobin 11.3 (*)    All other components within normal limits  BASIC METABOLIC PANEL - Abnormal; Notable for the following components:   Glucose, Bld 104 (*)    All other components within normal limits  URINALYSIS, ROUTINE W REFLEX MICROSCOPIC - Abnormal; Notable for the following components:   APPearance HAZY (*)    Protein, ur 100 (*)    All other components within normal limits  BRAIN NATRIURETIC PEPTIDE - Abnormal; Notable for the following components:   B Natriuretic Peptide 159.8 (*)    All other components within normal limits  TROPONIN I  URINALYSIS, MICROSCOPIC (REFLEX)    DG Chest 2 View  Final Result      Medications - No data to display   Procedures    EMERGENCY DEPARTMENT [place] CARDIAC EXAM "Study: Limited Ultrasound of the Heart and  Pericardium"  INDICATIONS:Dyspnea on exertion Multiple views of the heart and pericardium were obtained in real-time with a multi-frequency probe.  PERFORMED ZJ:IRCVEL IMAGES ARCHIVED?: Yes LIMITATIONS:  None VIEWS USED: Subcostal 4 chamber INTERPRETATION: Pericardial effusioin absent, anterior fat pad   Critical Care  ED Course and Medical  Decision Making  I have reviewed the triage vital signs and the nursing notes.  Pertinent labs & imaging results that were available during my care of the patient were reviewed by me and considered in my medical decision making (see below for details).  Unclear etiology of patient's arm pain, has known chronic shoulder pain in the past, also considering carpal tunnel syndrome, seems less likely to be cardiac in etiology.  Patient is dyspnea on exertion possibly related to heart failure, possibly symptomatic anemia.  Will screen with labs, BNP, troponin.  Will repeat chest x-ray today to exclude pneumonia, worsening cardiomegaly  Labs reveal normal troponin, very mildly elevated BNP, chest x-ray with left-sided infiltrate, possible pneumonia given patient's recent increased cough.  Bedside ultrasound without evidence of pericardial effusion.  Patient will follow-up with her PCP tomorrow morning to discuss medical management of possible new onset CHF as the cause of her progressive dyspnea exertion for the past several weeks.  Prescription for azithromycin.  After the discussed management above, the patient was determined to be safe for discharge.  The patient was in agreement with this plan and all questions regarding their care were answered.  ED return precautions were discussed and the patient will return to the ED with any significant worsening of condition.   Barth Kirks. Sedonia Small, MD [place] [email or web address]  Final Clinical Impressions(s) / ED Diagnoses     ICD-10-CM   1. Community acquired  pneumonia, unspecified laterality J18.9   2. SOB (shortness of breath) R06.02 DG Chest 2 View    DG Chest 2 View    ED Discharge Orders         Ordered    azithromycin (ZITHROMAX) 250 MG tablet     01/27/18 2150             Maudie Flakes, MD 01/27/18 2153

## 2018-04-12 ENCOUNTER — Other Ambulatory Visit: Payer: Self-pay | Admitting: Allergy and Immunology

## 2018-04-12 DIAGNOSIS — J4540 Moderate persistent asthma, uncomplicated: Secondary | ICD-10-CM

## 2018-06-22 ENCOUNTER — Other Ambulatory Visit: Payer: Self-pay

## 2018-06-22 ENCOUNTER — Emergency Department (HOSPITAL_BASED_OUTPATIENT_CLINIC_OR_DEPARTMENT_OTHER): Payer: Medicare Other

## 2018-06-22 ENCOUNTER — Encounter (HOSPITAL_BASED_OUTPATIENT_CLINIC_OR_DEPARTMENT_OTHER): Payer: Self-pay | Admitting: Emergency Medicine

## 2018-06-22 ENCOUNTER — Emergency Department (HOSPITAL_BASED_OUTPATIENT_CLINIC_OR_DEPARTMENT_OTHER)
Admission: EM | Admit: 2018-06-22 | Discharge: 2018-06-22 | Disposition: A | Discharge status: Home / Self Care | Payer: Medicare Other | Attending: Emergency Medicine | Admitting: Emergency Medicine

## 2018-06-22 DIAGNOSIS — E119 Type 2 diabetes mellitus without complications: Secondary | ICD-10-CM | POA: Insufficient documentation

## 2018-06-22 DIAGNOSIS — Z79899 Other long term (current) drug therapy: Secondary | ICD-10-CM | POA: Insufficient documentation

## 2018-06-22 DIAGNOSIS — Z7901 Long term (current) use of anticoagulants: Secondary | ICD-10-CM | POA: Diagnosis not present

## 2018-06-22 DIAGNOSIS — R0789 Other chest pain: Secondary | ICD-10-CM | POA: Insufficient documentation

## 2018-06-22 DIAGNOSIS — I10 Essential (primary) hypertension: Secondary | ICD-10-CM | POA: Insufficient documentation

## 2018-06-22 DIAGNOSIS — R079 Chest pain, unspecified: Secondary | ICD-10-CM | POA: Diagnosis present

## 2018-06-22 DIAGNOSIS — K219 Gastro-esophageal reflux disease without esophagitis: Secondary | ICD-10-CM

## 2018-06-22 DIAGNOSIS — Z7984 Long term (current) use of oral hypoglycemic drugs: Secondary | ICD-10-CM | POA: Diagnosis not present

## 2018-06-22 LAB — CBC
HCT: 36.7 % (ref 36.0–46.0)
Hemoglobin: 11.5 g/dL — ABNORMAL LOW (ref 12.0–15.0)
MCH: 28.8 pg (ref 26.0–34.0)
MCHC: 31.3 g/dL (ref 30.0–36.0)
MCV: 91.8 fL (ref 80.0–100.0)
Platelets: 295 10*3/uL (ref 150–400)
RBC: 4 MIL/uL (ref 3.87–5.11)
RDW: 14 % (ref 11.5–15.5)
WBC: 8.5 10*3/uL (ref 4.0–10.5)
nRBC: 0 % (ref 0.0–0.2)

## 2018-06-22 LAB — BASIC METABOLIC PANEL
Anion gap: 10 (ref 5–15)
BUN: 13 mg/dL (ref 8–23)
CO2: 25 mmol/L (ref 22–32)
Calcium: 9.5 mg/dL (ref 8.9–10.3)
Chloride: 104 mmol/L (ref 98–111)
Creatinine, Ser: 0.96 mg/dL (ref 0.44–1.00)
GFR calc Af Amer: >60 mL/min (ref >60)
GFR calc non Af Amer: 55 mL/min — ABNORMAL LOW (ref >60)
Glucose, Bld: 110 mg/dL — ABNORMAL HIGH (ref 70–99)
Potassium: 3.3 mmol/L — ABNORMAL LOW (ref 3.5–5.1)
Sodium: 139 mmol/L (ref 135–145)

## 2018-06-22 LAB — TROPONIN I
Troponin I: <0.03 ng/mL (ref <0.03)
Troponin I: <0.03 ng/mL (ref <0.03)

## 2018-06-22 MED ORDER — LIDOCAINE VISCOUS HCL 2 % MT SOLN
15.0000 mL | Freq: Once | OROMUCOSAL | Status: AC
  Administered 2018-06-22: 19:00:00 15 mL via ORAL
  Filled 2018-06-22: qty 15

## 2018-06-22 MED ORDER — ALUM & MAG HYDROXIDE-SIMETH 200-200-20 MG/5ML PO SUSP
30.0000 mL | Freq: Once | ORAL | Status: AC
  Administered 2018-06-22: 30 mL via ORAL
  Filled 2018-06-22: qty 30

## 2018-06-22 MED ORDER — OMEPRAZOLE 20 MG PO CPDR: 20.0000 mg | DELAYED_RELEASE_CAPSULE | Freq: Every day | ORAL | 0 refills | Status: DC

## 2018-06-22 NOTE — Discharge Instructions (Addendum)
You can continue to use maalox or mylanta, available over the counter according to label instructions as needed for indigestion.

## 2018-06-22 NOTE — ED Provider Notes (Signed)
[facility] EMERGENCY DEPARTMENT Provider Note   CSN: [id number] Arrival date & time: 06/22/18  1722    History   Chief Complaint Chief Complaint  Patient presents with  . Chest Pain    HPI [name] is a 85 y.o. female.     The history is provided by the patient and medical records. No language interpreter was used.  Chest Pain   [name] is a 85 y.o. female who presents to the Emergency Department complaining of chest pain.  She complains of one week of intermittent chest pain.  Pain is described as a sharp pain and short pain that is worse with eating. Sxs are more frequent/intense today.  Pain wraps to left axilla and arm.  She has associated HA, sob (mild), ankle edema.  Denies fevers, N/V/D.  She has a slight cough.  Denies COVID exposure.  She has mild abdominal discomfort (chronic, unchanged).  She has experienced similar sxs in the past related to afib.  She has not tried any medications at home.  She takes xarelto and an antihypertensive.   Past Medical History:  Diagnosis Date  . Arthritis    "all over"  . Atrial fibrillation ([place])    Diagnosed ~2009  . Breast cancer, left breast ([place]) 1998   S/P chemo and mastectomy   . GERD (gastroesophageal reflux disease)   . Hyperlipidemia   . Hypertension   . Left shoulder pain    "MRI showed pinched nerve" - per pt  . OSA on CPAP   . Pneumonia    "4-5 times" (01/06/2015)  . Type II diabetes mellitus [facility])     Patient Active Problem List   Diagnosis Date Noted  . Primary osteoarthritis, right ankle and foot 01/24/2018  . Arthritis of right ankle 08/21/2017  . Allergic urticaria 09/07/2016  . Generalized pruritus 09/07/2016  . Moderate persistent asthma 09/07/2016  . Perennial and seasonal allergic rhinitis 09/07/2016  . Chest pain 01/06/2015  . Angina at rest [facility]) 01/06/2015  . Diabetes ([place]) 01/06/2015  . A-fib ([place]) 01/06/2015  . HLD (hyperlipidemia) 01/06/2015  . Tobacco abuse  01/06/2015  . Diabetes mellitus with complication ([place])   . Painful total knee replacement ([place]) 01/28/2013  . Atrial fibrillation ([place])   . Left shoulder pain   . Hypertension     Past Surgical History:  Procedure Laterality Date  . BREAST BIOPSY Left 1998  . CATARACT EXTRACTION W/ INTRAOCULAR LENS  IMPLANT, BILATERAL Bilateral   . JOINT REPLACEMENT    . LAPAROSCOPIC CHOLECYSTECTOMY    . MASTECTOMY Left 1998  . TOTAL KNEE ARTHROPLASTY Left 2001  . TOTAL KNEE REVISION Left 01/28/2013   Procedure: LEFT TOTAL KNEE ARTHROPLASTY REVISION;  Surgeon: Marianna Payment, MD;  Location: [place];  Service: Orthopedics;  Laterality: Left;     OB History   No obstetric history on file.      Home Medications    Prior to Admission medications   Medication Sig Start Date End Date Taking? Authorizing Provider  albuterol (PROVENTIL HFA;VENTOLIN HFA) 108 (90 Base) MCG/ACT inhaler Inhale 1-2 puffs into the lungs every 4 (four) hours as needed for wheezing or shortness of breath. 09/07/16   Bobbitt, Sedalia Muta, MD  ALPRAZolam Duanne Moron) 0.5 MG tablet Take by mouth.    [provider]  Alum & Mag Hydroxide-Simeth (GI COCKTAIL) SUSP suspension Take 30 mLs by mouth 4 (four) times daily as needed for indigestion (or chest pain). Shake well. 01/08/15  Ghimire, Henreitta Leber, MD  amLODipine (NORVASC) 10 MG tablet Take 10 mg by mouth. 08/10/16   [provider]  aspirin 325 MG tablet Take by mouth. 04/19/08   [provider]  azithromycin (ZITHROMAX) 250 MG tablet Take first 2 tablets together, then 1 every day until finished. 01/27/18   Maudie Flakes, MD  Cholecalciferol (VITAMIN D) 2000 units tablet Frequency:daily   Dosage:2000   UNIT  Instructions:D3 Dots (2000UNIT TBDP, Oral daily)  Note:    [provider]  diltiazem (CARDIZEM CD) 120 MG 24 hr capsule Take 120 mg by mouth daily.    [provider]  feeding supplement, ENSURE ENLIVE, (ENSURE ENLIVE) LIQD Take 237  mLs by mouth 2 (two) times daily between meals. 01/07/15   Ghimire, Henreitta Leber, MD  FLOVENT HFA 110 MCG/ACT inhaler TAKE 2 PUFFS BY MOUTH TWICE A DAY 03/21/17   Bobbitt, Sedalia Muta, MD  fluticasone [facility]) 50 MCG/ACT nasal spray Use 1 spray per nostril 1-2 times daily as needed 09/07/16   Bobbitt, Sedalia Muta, MD  furosemide (LASIX) 20 MG tablet Take 20 mg by mouth. 06/14/16   [provider]  levocetirizine (XYZAL) 5 MG tablet TAKE 1 TABLET BY MOUTH EVERY DAY IN THE EVENING 03/14/17   Bobbitt, Sedalia Muta, MD  lisinopril (PRINIVIL,ZESTRIL) 10 MG tablet Take 10 mg by mouth daily.    [provider]  metFORMIN (GLUCOPHAGE) 500 MG tablet Take 500 mg by mouth. 07/27/15   [provider]  montelukast (SINGULAIR) 10 MG tablet TAKE 1 TABLET BY MOUTH EVERYDAY AT BEDTIME 03/14/17   Bobbitt, Sedalia Muta, MD  omeprazole (PRILOSEC) 20 MG capsule Take 1 capsule (20 mg total) by mouth daily. 06/22/18   Quintella Reichert, MD  pantoprazole (PROTONIX) 40 MG tablet Take 1 tablet (40 mg total) by mouth daily. 01/07/15   Ghimire, Henreitta Leber, MD  ranitidine (ZANTAC) 150 MG tablet Take 1 tablet (150 mg total) by mouth 2 (two) times daily as needed for heartburn. 09/07/16   Bobbitt, Sedalia Muta, MD  rivaroxaban (XARELTO) 20 MG TABS tablet Take 20 mg by mouth every morning.     [provider]    Family History Family History  Problem Relation Age of Onset  . Heart attack Mother        61s  . Emphysema Brother   . Allergic rhinitis Neg Hx   . Angioedema Neg Hx   . Asthma Neg Hx   . Eczema Neg Hx   . Immunodeficiency Neg Hx   . Urticaria Neg Hx     Social History Social History   Tobacco Use  . Smoking status: Never Smoker  . Smokeless tobacco: Current User    Types: Snuff  Substance Use Topics  . Alcohol use: Not Currently    Comment: 01/06/2015 "I'll have a beer q once in awhile"  . Drug use: No     Allergies   Chocolate; Lisinopril; and Peanuts [peanut oil]   Review  of Systems Review of Systems  Cardiovascular: Positive for chest pain.  All other systems reviewed and are negative.    Physical Exam Updated Vital Signs BP 134/83   Pulse 88   Temp 98.6 F (37 C) (Oral)   Resp (!) 22   Ht 5\' 4"  (1.626 m)   Wt 69.4 kg   SpO2 99%   BMI 26.26 kg/m   Physical Exam Vitals signs and nursing note reviewed.  Constitutional:      Appearance: She is well-developed.  HENT:     Head: Normocephalic and atraumatic.     Right Ear: Tympanic membrane normal.     Left Ear: Tympanic membrane normal.  Cardiovascular:     Rate and Rhythm: Normal rate.     Heart sounds: No murmur.     Comments: Irregular rhythm Pulmonary:     Effort: Pulmonary effort is normal. No respiratory distress.     Breath sounds: Normal breath sounds.  Chest:     Chest wall: Tenderness present.  Abdominal:     Palpations: Abdomen is soft.     Tenderness: There is no abdominal tenderness. There is no guarding or rebound.  Musculoskeletal:        General: No swelling or tenderness.     Left lower leg: No edema.  Skin:    General: Skin is warm and dry.     Capillary Refill: Capillary refill takes less than 2 seconds.  Neurological:     Mental Status: She is alert and oriented to person, place, and time.  Psychiatric:        Behavior: Behavior normal.      ED Treatments / Results  Labs (all labs ordered are listed, but only abnormal results are displayed) Labs Reviewed  BASIC METABOLIC PANEL - Abnormal; Notable for the following components:      Result Value   Potassium 3.3 (*)    Glucose, Bld 110 (*)    GFR calc non Af Amer 55 (*)    All other components within normal limits  CBC - Abnormal; Notable for the following components:   Hemoglobin 11.5 (*)    All other components within normal limits  TROPONIN I  TROPONIN I    EKG EKG Interpretation  Date/Time:  Saturday Jun 22 2018 17:27:39 EDT Ventricular Rate:  116 PR Interval:    QRS Duration: 86 QT  Interval:  350 QTC Calculation: 486 R Axis:   135 Text Interpretation:  atrial fibrillation, PVCs Right axis deviation Septal infarct , age undetermined Abnormal ECG Confirmed by Quintella Reichert [phone]) on 06/22/2018 5:35:52 PM   Radiology Dg Chest 2 View  Result Date: 06/22/2018 CLINICAL DATA:  Chest pain EXAM: CHEST - 2 VIEW COMPARISON:  01/27/2018 FINDINGS: Cardiac enlargement without heart failure. No focal infiltrate or effusion. Left mastectomy with surgical clips in the left axilla. IMPRESSION: No active cardiopulmonary disease. Electronically Signed   By: Franchot Gallo M.D.   On: 06/22/2018 19:04    Procedures Procedures (including critical care time)  Medications Ordered in ED Medications  alum & mag hydroxide-simeth (MAALOX/MYLANTA) 200-200-20 MG/5ML suspension 30 mL (30 mLs Oral Given 06/22/18 1912)    And  lidocaine (XYLOCAINE) 2 % viscous mouth solution 15 mL (15 mLs Oral Given 06/22/18 1914)     Initial Impression / Assessment and Plan / ED Course  I have reviewed the triage vital signs and the nursing notes.  Pertinent labs & imaging results that were available during my care of the patient were reviewed by me and considered in my medical decision making (see chart for details).        Patient here for evaluation of chest pain that is sharp in nature and worse with eating. EKG demonstrates rate controlled atrial fibrillation without acute ischemic changes. Her pain resolved after G.I. cocktail. Presentation is not consistent with ACS, PE, dissection, acute abdomen. Troponin's are negative times two, labs are near her baseline. Discussed with patient home care for atypical chest pain, likely reflux. Discussed importance of PCP follow-up  as well as return precautions.  Final Clinical Impressions(s) / ED Diagnoses   Final diagnoses:  Atypical chest pain  Gastroesophageal reflux disease without esophagitis    ED Discharge Orders         Ordered    omeprazole  (PRILOSEC) 20 MG capsule  Daily     06/22/18 2139           Quintella Reichert, MD 06/22/18 2245

## 2018-06-22 NOTE — ED Triage Notes (Signed)
L side chest pain since this morning. Also reports L arm pain for "awhile".

## 2018-09-13 ENCOUNTER — Other Ambulatory Visit: Payer: Self-pay

## 2018-09-13 ENCOUNTER — Encounter (HOSPITAL_BASED_OUTPATIENT_CLINIC_OR_DEPARTMENT_OTHER): Payer: Self-pay | Admitting: Emergency Medicine

## 2018-09-13 ENCOUNTER — Emergency Department (HOSPITAL_BASED_OUTPATIENT_CLINIC_OR_DEPARTMENT_OTHER)
Admission: EM | Admit: 2018-09-13 | Discharge: 2018-09-13 | Disposition: A | Discharge status: Home / Self Care | Payer: Medicare Other | Attending: Emergency Medicine | Admitting: Emergency Medicine

## 2018-09-13 DIAGNOSIS — Z91010 Allergy to peanuts: Secondary | ICD-10-CM | POA: Insufficient documentation

## 2018-09-13 DIAGNOSIS — F17290 Nicotine dependence, other tobacco product, uncomplicated: Secondary | ICD-10-CM | POA: Insufficient documentation

## 2018-09-13 DIAGNOSIS — H538 Other visual disturbances: Secondary | ICD-10-CM | POA: Diagnosis not present

## 2018-09-13 DIAGNOSIS — Z7901 Long term (current) use of anticoagulants: Secondary | ICD-10-CM | POA: Insufficient documentation

## 2018-09-13 DIAGNOSIS — H5712 Ocular pain, left eye: Secondary | ICD-10-CM | POA: Diagnosis present

## 2018-09-13 DIAGNOSIS — H53149 Visual discomfort, unspecified: Secondary | ICD-10-CM | POA: Diagnosis not present

## 2018-09-13 DIAGNOSIS — I10 Essential (primary) hypertension: Secondary | ICD-10-CM | POA: Diagnosis not present

## 2018-09-13 DIAGNOSIS — H539 Unspecified visual disturbance: Secondary | ICD-10-CM

## 2018-09-13 DIAGNOSIS — Z79899 Other long term (current) drug therapy: Secondary | ICD-10-CM | POA: Diagnosis not present

## 2018-09-13 DIAGNOSIS — E119 Type 2 diabetes mellitus without complications: Secondary | ICD-10-CM | POA: Insufficient documentation

## 2018-09-13 MED ORDER — TETRACAINE HCL 0.5 % OP SOLN
2.0000 [drp] | Freq: Once | OPHTHALMIC | Status: AC
  Administered 2018-09-13: 10:00:00 2 [drp] via OPHTHALMIC
  Filled 2018-09-13: qty 4

## 2018-09-13 MED ORDER — FLUORESCEIN SODIUM 1 MG OP STRP
1.0000 | ORAL_STRIP | Freq: Once | OPHTHALMIC | Status: AC
  Administered 2018-09-13: 1 via OPHTHALMIC
  Filled 2018-09-13: qty 1

## 2018-09-13 NOTE — ED Provider Notes (Signed)
[facility] EMERGENCY DEPARTMENT Provider Note   CSN: [id number] Arrival date & time: 09/13/18  0915    History   Chief Complaint Chief Complaint  Patient presents with  . Eye Pain    HPI [name] is a 85 y.o. female.     HPI  Presents to left eye pain and blurred vision for one month  Saw Dr. Zenia Resides OD in [facility], eye doctor, not sure what she diagnosed her with, there is note from PCP that says she was concerned about temporal arteritis PCP referring her to Neurologist  Black dots running back and forth in left eye, eyeball feels sore. Just got like that day before yesterday, was first getting better with eye drops from eye doctor.  Vision also blurry. Light hurts the eye.  Reports last week it was getting a lot better but then over the last few days it worsened again. No eye drainage. No fevers. No pain with eye movement. No contact lens use.  Uses glasses.  Years ago had cataracts surgery 15 years ago in another part of [place].  No hx of glaucoma.   Past Medical History:  Diagnosis Date  . Arthritis    "all over"  . Atrial fibrillation ([place])    Diagnosed ~2009  . Breast cancer, left breast ([place]) 1998   S/P chemo and mastectomy   . GERD (gastroesophageal reflux disease)   . Hyperlipidemia   . Hypertension   . Left shoulder pain    "MRI showed pinched nerve" - per pt  . OSA on CPAP   . Pneumonia    "4-5 times" (01/06/2015)  . Type II diabetes mellitus [facility])     Patient Active Problem List   Diagnosis Date Noted  . Primary osteoarthritis, right ankle and foot 01/24/2018  . Arthritis of right ankle 08/21/2017  . Allergic urticaria 09/07/2016  . Generalized pruritus 09/07/2016  . Moderate persistent asthma 09/07/2016  . Perennial and seasonal allergic rhinitis 09/07/2016  . Chest pain 01/06/2015  . Angina at rest [facility]) 01/06/2015  . Diabetes ([place]) 01/06/2015  . A-fib ([place]) 01/06/2015  . HLD (hyperlipidemia) 01/06/2015  .  Tobacco abuse 01/06/2015  . Diabetes mellitus with complication ([place])   . Painful total knee replacement ([place]) 01/28/2013  . Atrial fibrillation ([place])   . Left shoulder pain   . Hypertension     Past Surgical History:  Procedure Laterality Date  . BREAST BIOPSY Left 1998  . CATARACT EXTRACTION W/ INTRAOCULAR LENS  IMPLANT, BILATERAL Bilateral   . JOINT REPLACEMENT    . LAPAROSCOPIC CHOLECYSTECTOMY    . MASTECTOMY Left 1998  . TOTAL KNEE ARTHROPLASTY Left 2001  . TOTAL KNEE REVISION Left 01/28/2013   Procedure: LEFT TOTAL KNEE ARTHROPLASTY REVISION;  Surgeon: Marianna Payment, MD;  Location: [facility];  Service: Orthopedics;  Laterality: Left;     OB History   No obstetric history on file.      Home Medications    Prior to Admission medications   Medication Sig Start Date End Date Taking? Authorizing Provider  albuterol (PROVENTIL HFA;VENTOLIN HFA) 108 (90 Base) MCG/ACT inhaler Inhale 1-2 puffs into the lungs every 4 (four) hours as needed for wheezing or shortness of breath. 09/07/16   Bobbitt, [place] Muta, MD  amLODipine (NORVASC) 10 MG tablet Take 10 mg by mouth. 08/10/16   [provider]  Cholecalciferol (VITAMIN D) 2000 units tablet Frequency:daily   Dosage:2000   UNIT  Instructions:D3 Dots (2000UNIT TBDP, Oral  daily)  Note:    [provider]  FLOVENT HFA 110 MCG/ACT inhaler TAKE 2 PUFFS BY MOUTH TWICE A DAY 03/21/17   Bobbitt, [place] Muta, MD  fluticasone [facility]) 50 MCG/ACT nasal spray Use 1 spray per nostril 1-2 times daily as needed 09/07/16   Bobbitt, [place] Muta, MD  furosemide (LASIX) 20 MG tablet Take 20 mg by mouth. 06/14/16   [provider]  levocetirizine (XYZAL) 5 MG tablet TAKE 1 TABLET BY MOUTH EVERY DAY IN THE EVENING 03/14/17   Bobbitt, [place] Muta, MD  lisinopril (PRINIVIL,ZESTRIL) 10 MG tablet Take 10 mg by mouth daily.    [provider]  montelukast (SINGULAIR) 10 MG tablet TAKE 1 TABLET BY MOUTH EVERYDAY AT BEDTIME 03/14/17    Bobbitt, Sed[place] Muta, MD  omeprazole (PRILOSEC) 20 MG capsule Take 1 capsule (20 mg total) by mouth daily. 06/22/18   Quintella Reichert, MD  rivaroxaban (XARELTO) 20 MG TABS tablet Take 20 mg by mouth every morning.     [provider]    Family History Family History  Problem Relation Age of Onset  . Heart attack Mother        4s  . Emphysema Brother   . Allergic rhinitis Neg Hx   . Angioedema Neg Hx   . Asthma Neg Hx   . Eczema Neg Hx   . Immunodeficiency Neg Hx   . Urticaria Neg Hx     Social History Social History   Tobacco Use  . Smoking status: Never Smoker  . Smokeless tobacco: Current User    Types: Snuff  Substance Use Topics  . Alcohol use: Not Currently    Comment: 01/06/2015 "I'll have a beer q once in awhile"  . Drug use: No     Allergies   Chocolate, Lisinopril, and Peanuts [peanut oil]   Review of Systems Review of Systems  Constitutional: Negative for fever.  Eyes: Positive for photophobia, pain and visual disturbance. Negative for discharge and redness.  Neurological: Negative for weakness, numbness and headaches.     Physical Exam Updated Vital Signs BP (!) 141/85 (BP Location: Right Arm)   Pulse 92   Temp 98.5 F (36.9 C) (Oral)   Resp 16   Ht 5\' 4"  (1.626 m)   Wt 73.7 kg   SpO2 97%   BMI 27.89 kg/m   Physical Exam Vitals signs and nursing note reviewed.  Constitutional:      General: She is not in acute distress.    Appearance: She is well-developed. She is not diaphoretic.  HENT:     Head: Normocephalic and atraumatic.  Eyes:     Extraocular Movements: Extraocular movements intact.     Pupils: Pupils are equal, round, and reactive to light.     Comments: IOP 25 and 26 No sign of corneal abrasion Mild conjunctival injection left eye  Neck:     Musculoskeletal: Normal range of motion.  Cardiovascular:     Rate and Rhythm: Normal rate and regular rhythm.     Heart sounds: No murmur.  Pulmonary:     Effort:  Pulmonary effort is normal. No respiratory distress.  Abdominal:     General: There is no distension.     Palpations: Abdomen is soft.     Tenderness: There is no abdominal tenderness. There is no guarding.  Musculoskeletal:        General: No tenderness.  Skin:    General: Skin is warm and dry.     Findings: No erythema  or rash.  Neurological:     Mental Status: She is alert and oriented to person, place, and time.      ED Treatments / Results  Labs (all labs ordered are listed, but only abnormal results are displayed) Labs Reviewed - No data to display  EKG None  Radiology No results found.  Procedures Procedures (including critical care time)  Medications Ordered in ED Medications  tetracaine (PONTOCAINE) 0.5 % ophthalmic solution 2 drop (2 drops Left Eye Given 09/13/18 0948)  fluorescein ophthalmic strip 1 strip (1 strip Left Eye Given 09/13/18 0949)     Visual Acuity  Right Eye Distance: 20/50(Corrected) Left Eye Distance: 20/50(Corrected) Bilateral Distance: 20/40(Corrected)  Right Eye Near:   Left Eye Near:    Bilateral Near:      Initial Impression / Assessment and Plan / ED Course  I have reviewed the triage vital signs and the nursing notes.  Pertinent labs & imaging results that were available during my care of the patient were reviewed by me and considered in my medical decision making (see chart for details).        85yo female with history above presents with concern for one month of left eye pain and blurred vision. No sign of glaucoma or corneal abrasion.  Normal EOM without pain, no fever, doubt orbital cellulitis. Consider iritis, retinal pathology given floaters described.  Called ophthalmology, Dr. Alanda Slim, who will see her today in the office for evaluation.   Final Clinical Impressions(s) / ED Diagnoses   Final diagnoses:  Visual changes  Pain of left eye  Photophobia    ED Discharge Orders    None       Gareth Morgan, MD  09/13/18 2137

## 2018-09-13 NOTE — ED Triage Notes (Signed)
Pain in left for over a month.  Has seen eye doctor and pmd with no improvement.  She has another appt next month but sts the pain is so bad she can't stand it.

## 2018-09-13 NOTE — ED Notes (Signed)
ED Provider at bedside. 

## 2018-11-26 ENCOUNTER — Other Ambulatory Visit: Payer: Self-pay

## 2018-11-26 ENCOUNTER — Ambulatory Visit (INDEPENDENT_AMBULATORY_CARE_PROVIDER_SITE_OTHER): Payer: Medicare Other | Admitting: Orthopaedic Surgery

## 2018-11-26 DIAGNOSIS — M19071 Primary osteoarthritis, right ankle and foot: Secondary | ICD-10-CM

## 2018-11-26 MED ORDER — LIDOCAINE HCL 1 % IJ SOLN
1.0000 mL | INTRAMUSCULAR | Status: AC | PRN
  Administered 2018-11-26: 1 mL

## 2018-11-26 MED ORDER — METHYLPREDNISOLONE ACETATE 40 MG/ML IJ SUSP
40.0000 mg | INTRAMUSCULAR | Status: AC | PRN
  Administered 2018-11-26: 40 mg via INTRA_ARTICULAR

## 2018-11-26 MED ORDER — BUPIVACAINE HCL 0.5 % IJ SOLN
1.0000 mL | INTRAMUSCULAR | Status: AC | PRN
  Administered 2018-11-26: 14:00:00 1 mL via INTRA_ARTICULAR

## 2018-11-26 NOTE — Progress Notes (Signed)
Office Visit Note   Patient: [name]           Date of Birth: [birth year]           MRN: [id number] Visit Date: 11/26/2018              Requested by: Elisabeth Cara, [id number] [place],  [place] [address] PCP: Belva Bertin, [place], [place]   Assessment & Plan: Visit Diagnoses:  1. Primary osteoarthritis, right ankle and foot     Plan: Impression is end-stage right tibiotalar osteoarthritis.  She is still getting relief from injections therefore repeated another 1 today.  Questions encouraged and answered.  Follow-up as needed.  Follow-Up Instructions: Return if symptoms worsen or fail to improve.   Orders:  No orders of the defined types were placed in this encounter.  No orders of the defined types were placed in this encounter.     Procedures: Medium Joint Inj: R ankle on 11/26/2018 2:20 PM Indications: pain Details: 25 G needle Medications: 1 mL lidocaine 1 %; 40 mg methylPREDNISolone acetate 40 MG/ML; 1 mL bupivacaine 0.5 % Outcome: tolerated well, no immediate complications Patient was prepped and draped in the usual sterile fashion.       Clinical Data: No additional findings.   Subjective: Chief Complaint  Patient presents with  . Right Ankle - Pain    [name] returns today for evaluation of continued right ankle pain.  She has had good response to previous ankle injections and she is requesting another 1 today.  No changes in medical history.  No injuries.   Review of Systems   Objective: Vital Signs: There were no vitals taken for this visit.  Physical Exam  Ortho Exam Right ankle exam is unchanged.  She does have positive tibiotalar crepitus with range of motion. Specialty Comments:  No specialty comments available.  Imaging: No results found.   PMFS History: Patient Active Problem List   Diagnosis Date Noted  . Primary osteoarthritis, right ankle and foot 01/24/2018  . Arthritis of right  ankle 08/21/2017  . Allergic urticaria 09/07/2016  . Generalized pruritus 09/07/2016  . Moderate persistent asthma 09/07/2016  . Perennial and seasonal allergic rhinitis 09/07/2016  . Chest pain 01/06/2015  . Angina at rest [facility]) 01/06/2015  . Diabetes ([place]) 01/06/2015  . A-fib ([place]) 01/06/2015  . HLD (hyperlipidemia) 01/06/2015  . Tobacco abuse 01/06/2015  . Diabetes mellitus with complication ([place])   . Painful total knee replacement ([place]) 01/28/2013  . Atrial fibrillation ([place])   . Left shoulder pain   . Hypertension    Past Medical History:  Diagnosis Date  . Arthritis    "all over"  . Atrial fibrillation ([employer])    Diagnosed ~2009  . Breast cancer, left breast ([place]) 1998   S/P chemo and mastectomy   . GERD (gastroesophageal reflux disease)   . Hyperlipidemia   . Hypertension   . Left shoulder pain    "MRI showed pinched nerve" - per pt  . OSA on CPAP   . Pneumonia    "4-5 times" (01/06/2015)  . Type II diabetes mellitus ([facility])     Family History  Problem Relation Age of Onset  . Heart attack Mother        25s  . Emphysema Brother   . Allergic rhinitis Neg Hx   . Angioedema Neg Hx   . Asthma Neg Hx   . Eczema Neg Hx   . Immunodeficiency Neg  Hx   . Urticaria Neg Hx     Past Surgical History:  Procedure Laterality Date  . BREAST BIOPSY Left 1998  . CATARACT EXTRACTION W/ INTRAOCULAR LENS  IMPLANT, BILATERAL Bilateral   . JOINT REPLACEMENT    . LAPAROSCOPIC CHOLECYSTECTOMY    . MASTECTOMY Left 1998  . TOTAL KNEE ARTHROPLASTY Left 2001  . TOTAL KNEE REVISION Left 01/28/2013   Procedure: LEFT TOTAL KNEE ARTHROPLASTY REVISION;  Surgeon: Marianna Payment, MD;  Location: [facility];  Service: Orthopedics;  Laterality: Left;   Social History   Occupational History  . Not on file  Tobacco Use  . Smoking status: Never Smoker  . Smokeless tobacco: Current User    Types: Snuff  Substance and Sexual Activity  . Alcohol use: Not Currently    Comment: 01/06/2015 "I'll  have a beer q once in awhile"  . Drug use: No  . Sexual activity: Never

## 2019-02-05 ENCOUNTER — Other Ambulatory Visit: Payer: Self-pay

## 2019-02-05 ENCOUNTER — Emergency Department (HOSPITAL_BASED_OUTPATIENT_CLINIC_OR_DEPARTMENT_OTHER)
Admission: EM | Admit: 2019-02-05 | Discharge: 2019-02-05 | Disposition: A | Discharge status: Home / Self Care | Payer: Medicare Other | Attending: Emergency Medicine | Admitting: Emergency Medicine

## 2019-02-05 ENCOUNTER — Encounter (HOSPITAL_BASED_OUTPATIENT_CLINIC_OR_DEPARTMENT_OTHER): Payer: Self-pay | Admitting: Emergency Medicine

## 2019-02-05 ENCOUNTER — Emergency Department (HOSPITAL_BASED_OUTPATIENT_CLINIC_OR_DEPARTMENT_OTHER): Payer: Medicare Other

## 2019-02-05 DIAGNOSIS — R519 Headache, unspecified: Secondary | ICD-10-CM | POA: Diagnosis present

## 2019-02-05 DIAGNOSIS — Z7901 Long term (current) use of anticoagulants: Secondary | ICD-10-CM | POA: Diagnosis not present

## 2019-02-05 DIAGNOSIS — Z91010 Allergy to peanuts: Secondary | ICD-10-CM | POA: Insufficient documentation

## 2019-02-05 DIAGNOSIS — Z79899 Other long term (current) drug therapy: Secondary | ICD-10-CM | POA: Diagnosis not present

## 2019-02-05 DIAGNOSIS — M1990 Unspecified osteoarthritis, unspecified site: Secondary | ICD-10-CM | POA: Diagnosis not present

## 2019-02-05 DIAGNOSIS — Z853 Personal history of malignant neoplasm of breast: Secondary | ICD-10-CM | POA: Insufficient documentation

## 2019-02-05 DIAGNOSIS — G44209 Tension-type headache, unspecified, not intractable: Secondary | ICD-10-CM | POA: Insufficient documentation

## 2019-02-05 DIAGNOSIS — I10 Essential (primary) hypertension: Secondary | ICD-10-CM | POA: Insufficient documentation

## 2019-02-05 DIAGNOSIS — E119 Type 2 diabetes mellitus without complications: Secondary | ICD-10-CM | POA: Insufficient documentation

## 2019-02-05 DIAGNOSIS — Z9221 Personal history of antineoplastic chemotherapy: Secondary | ICD-10-CM | POA: Insufficient documentation

## 2019-02-05 DIAGNOSIS — M542 Cervicalgia: Secondary | ICD-10-CM | POA: Diagnosis not present

## 2019-02-05 DIAGNOSIS — F17228 Nicotine dependence, chewing tobacco, with other nicotine-induced disorders: Secondary | ICD-10-CM | POA: Diagnosis not present

## 2019-02-05 DIAGNOSIS — Z96652 Presence of left artificial knee joint: Secondary | ICD-10-CM | POA: Diagnosis not present

## 2019-02-05 LAB — COMPREHENSIVE METABOLIC PANEL
ALT: 8 U/L (ref 0–44)
AST: 18 U/L (ref 15–41)
Albumin: 4 g/dL (ref 3.5–5.0)
Alkaline Phosphatase: 69 U/L (ref 38–126)
Anion gap: 9 (ref 5–15)
BUN: 10 mg/dL (ref 8–23)
CO2: 23 mmol/L (ref 22–32)
Calcium: 9.2 mg/dL (ref 8.9–10.3)
Chloride: 107 mmol/L (ref 98–111)
Creatinine, Ser: 0.99 mg/dL (ref 0.44–1.00)
GFR calc Af Amer: >60 mL/min (ref >60)
GFR calc non Af Amer: 53 mL/min — ABNORMAL LOW (ref >60)
Glucose, Bld: 96 mg/dL (ref 70–99)
Potassium: 4.3 mmol/L (ref 3.5–5.1)
Sodium: 139 mmol/L (ref 135–145)
Total Bilirubin: 0.3 mg/dL (ref 0.3–1.2)
Total Protein: 8 g/dL (ref 6.5–8.1)

## 2019-02-05 LAB — CBC WITH DIFFERENTIAL/PLATELET
Abs Immature Granulocytes: 0.02 10*3/uL (ref 0.00–0.07)
Basophils Absolute: 0 10*3/uL (ref 0.0–0.1)
Basophils Relative: 1 %
Eosinophils Absolute: 0.2 10*3/uL (ref 0.0–0.5)
Eosinophils Relative: 3 %
HCT: 35.5 % — ABNORMAL LOW (ref 36.0–46.0)
Hemoglobin: 10.9 g/dL — ABNORMAL LOW (ref 12.0–15.0)
Immature Granulocytes: 0 %
Lymphocytes Relative: 21 %
Lymphs Abs: 1.6 10*3/uL (ref 0.7–4.0)
MCH: 27.5 pg (ref 26.0–34.0)
MCHC: 30.7 g/dL (ref 30.0–36.0)
MCV: 89.6 fL (ref 80.0–100.0)
Monocytes Absolute: 0.8 10*3/uL (ref 0.1–1.0)
Monocytes Relative: 10 %
Neutro Abs: 4.9 10*3/uL (ref 1.7–7.7)
Neutrophils Relative %: 65 %
Platelets: 280 10*3/uL (ref 150–400)
RBC: 3.96 MIL/uL (ref 3.87–5.11)
RDW: 15.9 % — ABNORMAL HIGH (ref 11.5–15.5)
WBC: 7.5 10*3/uL (ref 4.0–10.5)
nRBC: 0 % (ref 0.0–0.2)

## 2019-02-05 NOTE — ED Triage Notes (Signed)
"  off and on" for a few weeks pt has had a left sided headache, neck and bilateral shoulder pain, and pain in her left side.  Also her hands get numb sometimes.

## 2019-02-05 NOTE — ED Provider Notes (Signed)
[facility] EMERGENCY DEPARTMENT Provider Note   CSN: [id number] Arrival date & time: 02/05/19  1229     History Chief Complaint  Patient presents with  . Headache    [name] is a 85 y.o. female.  The history is provided by the patient. No language interpreter was used.  Headache Pain location:  Generalized Quality:  Sharp Radiates to:  L neck, R neck and L arm Onset quality:  Gradual Timing:  Constant Progression:  Worsening Chronicity:  New Similar to prior headaches: no   Relieved by:  Nothing Worsened by:  Nothing Ineffective treatments:  None tried Associated symptoms: no abdominal pain, no cough, no vomiting and no weakness   Risk factors: does not have insomnia    Pt reports she has had a headache for over 5 weeks.  Pt states she also has pain in her neck.  Pt states neck pain goes to left shoulder.  Pt complains of both hands felling tingly and numb.  Pt has been taking tylenol with no relief.     Past Medical History:  Diagnosis Date  . Arthritis    "all over"  . Atrial fibrillation ([place])    Diagnosed ~2009  . Breast cancer, left breast ([place]) 1998   S/P chemo and mastectomy   . GERD (gastroesophageal reflux disease)   . Hyperlipidemia   . Hypertension   . Left shoulder pain    "MRI showed pinched nerve" - per pt  . OSA on CPAP   . Pneumonia    "4-5 times" (01/06/2015)  . Type II diabetes mellitus [facility])     Patient Active Problem List   Diagnosis Date Noted  . Primary osteoarthritis, right ankle and foot 01/24/2018  . Arthritis of right ankle 08/21/2017  . Allergic urticaria 09/07/2016  . Generalized pruritus 09/07/2016  . Moderate persistent asthma 09/07/2016  . Perennial and seasonal allergic rhinitis 09/07/2016  . Chest pain 01/06/2015  . Angina at rest [facility]) 01/06/2015  . Diabetes ([place]) 01/06/2015  . A-fib ([place]) 01/06/2015  . HLD (hyperlipidemia) 01/06/2015  . Tobacco abuse 01/06/2015  . Diabetes mellitus with  complication ([place])   . Painful total knee replacement ([place]) 01/28/2013  . Atrial fibrillation ([place])   . Left shoulder pain   . Hypertension     Past Surgical History:  Procedure Laterality Date  . BREAST BIOPSY Left 1998  . CATARACT EXTRACTION W/ INTRAOCULAR LENS  IMPLANT, BILATERAL Bilateral   . JOINT REPLACEMENT    . LAPAROSCOPIC CHOLECYSTECTOMY    . MASTECTOMY Left 1998  . TOTAL KNEE ARTHROPLASTY Left 2001  . TOTAL KNEE REVISION Left 01/28/2013   Procedure: LEFT TOTAL KNEE ARTHROPLASTY REVISION;  Surgeon: Marianna Payment, MD;  Location: [facility];  Service: Orthopedics;  Laterality: Left;     OB History   No obstetric history on file.     Family History  Problem Relation Age of Onset  . Heart attack Mother        23s  . Emphysema Brother   . Allergic rhinitis Neg Hx   . Angioedema Neg Hx   . Asthma Neg Hx   . Eczema Neg Hx   . Immunodeficiency Neg Hx   . Urticaria Neg Hx     Social History   Tobacco Use  . Smoking status: Never Smoker  . Smokeless tobacco: Current User    Types: Snuff  Substance Use Topics  . Alcohol use: Not Currently    Comment: 01/06/2015 "I'll have a  beer q once in awhile"  . Drug use: No    Home Medications Prior to Admission medications   Medication Sig Start Date End Date Taking? Authorizing Provider  albuterol (PROVENTIL HFA;VENTOLIN HFA) 108 (90 Base) MCG/ACT inhaler Inhale 1-2 puffs into the lungs every 4 (four) hours as needed for wheezing or shortness of breath. 09/07/16   Bobbitt, [place] Muta, MD  amLODipine (NORVASC) 10 MG tablet Take 10 mg by mouth. 08/10/16   [provider]  Cholecalciferol (VITAMIN D) 2000 units tablet Frequency:daily   Dosage:2000   UNIT  Instructions:D3 Dots (2000UNIT TBDP, Oral daily)  Note:    [provider]  FLOVENT HFA 110 MCG/ACT inhaler TAKE 2 PUFFS BY MOUTH TWICE A DAY 03/21/17   Bobbitt, [place] Muta, MD  fluticasone [facility]) 50 MCG/ACT nasal spray Use 1 spray per nostril 1-2 times  daily as needed 09/07/16   Bobbitt, [place] Muta, MD  furosemide (LASIX) 20 MG tablet Take 20 mg by mouth. 06/14/16   [provider]  levocetirizine (XYZAL) 5 MG tablet TAKE 1 TABLET BY MOUTH EVERY DAY IN THE EVENING 03/14/17   Bobbitt, [place] Muta, MD  lisinopril (PRINIVIL,ZESTRIL) 10 MG tablet Take 10 mg by mouth daily.    [provider]  montelukast (SINGULAIR) 10 MG tablet TAKE 1 TABLET BY MOUTH EVERYDAY AT BEDTIME 03/14/17   Bobbitt, [place] Muta, MD  omeprazole (PRILOSEC) 20 MG capsule Take 1 capsule (20 mg total) by mouth daily. 06/22/18   Quintella Reichert, MD  rivaroxaban (XARELTO) 20 MG TABS tablet Take 20 mg by mouth every morning.     [provider]    Allergies    Chocolate, Lisinopril, and Peanuts [peanut oil]  Review of Systems   Review of Systems  Respiratory: Negative for cough.   Gastrointestinal: Negative for abdominal pain and vomiting.  Neurological: Positive for headaches. Negative for weakness.  All other systems reviewed and are negative.   Physical Exam Updated Vital Signs BP 110/73   Pulse 70   Temp 98.7 F (37.1 C) (Oral)   Resp 20   Ht 5\' 4"  (1.626 m)   Wt 72.6 kg   SpO2 100%   BMI 27.48 kg/m   Physical Exam Vitals and nursing note reviewed.  Constitutional:      Appearance: She is well-developed.  HENT:     Head: Normocephalic.     Mouth/Throat:     Mouth: Mucous membranes are moist.  Cardiovascular:     Rate and Rhythm: Normal rate and regular rhythm.  Pulmonary:     Effort: Pulmonary effort is normal.  Abdominal:     General: There is no distension.  Musculoskeletal:        General: Normal range of motion.     Cervical back: Normal range of motion.  Skin:    General: Skin is warm.  Neurological:     Mental Status: She is alert and oriented to person, place, and time.     Cranial Nerves: No cranial nerve deficit.     Deep Tendon Reflexes: Reflexes abnormal.  Psychiatric:        Mood and Affect: Mood normal.      ED Results / Procedures / Treatments   Labs (all labs ordered are listed, but only abnormal results are displayed) Labs Reviewed  CBC WITH DIFFERENTIAL/PLATELET - Abnormal; Notable for the following components:      Result Value   Hemoglobin 10.9 (*)    HCT 35.5 (*)    RDW 15.9 (*)  All other components within normal limits  COMPREHENSIVE METABOLIC PANEL - Abnormal; Notable for the following components:   GFR calc non Af Amer 53 (*)    All other components within normal limits    EKG None  Radiology CT Head Wo Contrast  Result Date: 02/05/2019 CLINICAL DATA:  Intermittent left side headache, neck and bilateral shoulder pain for a few weeks. No known injury. EXAM: CT HEAD WITHOUT CONTRAST CT CERVICAL SPINE WITHOUT CONTRAST TECHNIQUE: Multidetector CT imaging of the head and cervical spine was performed following the standard protocol without intravenous contrast. Multiplanar CT image reconstructions of the cervical spine were also generated. COMPARISON:  Head CT scan 12/20/2013. FINDINGS: CT HEAD FINDINGS Brain: No evidence of acute infarction, hemorrhage, hydrocephalus, extra-axial collection or mass lesion/mass effect. Vascular: No hyperdense vessel or unexpected calcification. Skull: Normal. Negative for fracture or focal lesion. Sinuses/Orbits: Negative. Other: None. CT CERVICAL SPINE FINDINGS Alignment: There is straightening of the normal cervical lordosis. Facet degenerative change results in trace anterolisthesis C4 on C5 and 0.3 cm anterolisthesis C5 on C6. Skull base and vertebrae: No acute fracture. No primary bone lesion or focal pathologic process. 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 Soft tissues and spinal canal: No prevertebral fluid or swelling. No visible canal hematoma. Disc levels: Marked multilevel loss of disc space height is worst at C3-4 space and C4-5. Upper chest: Lung apices clear. Other: None. IMPRESSION: No acute  abnormality head or cervical spine. Multilevel cervical spondylosis. Electronically Signed   By: Inge Rise M.D.   On: 02/05/2019 15:38   CT Cervical Spine Wo Contrast  Result Date: 02/05/2019 CLINICAL DATA:  Intermittent left side headache, neck and bilateral shoulder pain for a few weeks. No known injury. EXAM: CT HEAD WITHOUT CONTRAST CT CERVICAL SPINE WITHOUT CONTRAST TECHNIQUE: Multidetector CT imaging of the head and cervical spine was performed following the standard protocol without intravenous contrast. Multiplanar CT image reconstructions of the cervical spine were also generated. COMPARISON:  Head CT scan 12/20/2013. FINDINGS: CT HEAD FINDINGS Brain: No evidence of acute infarction, hemorrhage, hydrocephalus, extra-axial collection or mass lesion/mass effect. Vascular: No hyperdense vessel or unexpected calcification. Skull: Normal. Negative for fracture or focal lesion. Sinuses/Orbits: Negative. Other: None. CT CERVICAL SPINE FINDINGS Alignment: There is straightening of the normal cervical lordosis. Facet degenerative change results in trace anterolisthesis C4 on C5 and 0.3 cm anterolisthesis C5 on C6. Skull base and vertebrae: No acute fracture. No primary bone lesion or focal pathologic process. 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 Soft tissues and spinal canal: No prevertebral fluid or swelling. No visible canal hematoma. Disc levels: Marked multilevel loss of disc space height is worst at C3-4 space and C4-5. Upper chest: Lung apices clear. Other: None. IMPRESSION: No acute abnormality head or cervical spine. Multilevel cervical spondylosis. Electronically Signed   By: Inge Rise M.D.   On: 02/05/2019 15:38    Procedures Procedures (including critical care time)  Medications Ordered in ED Medications - No data to display  ED Course  I have reviewed the triage vital signs and the nursing notes.  Pertinent labs & imaging results that  were available during my care of the patient were reviewed by me and considered in my medical decision making (see chart for details).    MDM Rules/Calculators/A&P                      MDM Ct head and  c spine shows degenerative changes c spine.  No acute abnormality on ct brain.   Pt counseled on results.  Pt advised to continue tylenol as needed.  Follow up with primary care for recheck  Final Clinical Impression(s) / ED Diagnoses Final diagnoses:  Tension-type headache, not intractable, unspecified chronicity pattern  Neck pain  Arthritis    Rx / DC Orders ED Discharge Orders    None    An After Visit Summary was printed and given to the patient.    Fransico Meadow, [place] 02/05/19 1828    Wyvonnia Dusky, MD 02/05/19 [phone]

## 2019-02-05 NOTE — Discharge Instructions (Signed)
See your Physician for recheck.  Return if any problems.  °

## 2019-02-26 ENCOUNTER — Ambulatory Visit (INDEPENDENT_AMBULATORY_CARE_PROVIDER_SITE_OTHER): Payer: Medicare Other | Admitting: Orthopaedic Surgery

## 2019-02-26 ENCOUNTER — Other Ambulatory Visit: Payer: Self-pay

## 2019-02-26 ENCOUNTER — Ambulatory Visit (INDEPENDENT_AMBULATORY_CARE_PROVIDER_SITE_OTHER): Payer: Medicare Other

## 2019-02-26 DIAGNOSIS — M542 Cervicalgia: Secondary | ICD-10-CM

## 2019-02-26 DIAGNOSIS — M25512 Pain in left shoulder: Secondary | ICD-10-CM

## 2019-02-26 MED ORDER — PREDNISONE 10 MG (21) PO TBPK: ORAL_TABLET | ORAL | 0 refills | Status: DC

## 2019-02-26 NOTE — Progress Notes (Signed)
Office Visit Note   Patient: [name]           Date of Birth: July 22, 1933           MRN: [id number] Visit Date: 02/26/2019              Requested by: Elisabeth Cara, [id number] [place],  [place] [address] PCP: Belva Bertin, [place], [place]   Assessment & Plan: Visit Diagnoses:  1. Cervicalgia   2. Acute pain of left shoulder     Plan: Impression is cervical radiculopathy.  We will send in a prescription for prednisone Dosepak.  Patient instructed to follow-up if she does not receive any relief from this.  Follow-Up Instructions: Return if symptoms worsen or fail to improve.   Orders:  Orders Placed This Encounter  Procedures  . XR Shoulder Left  . XR Cervical Spine 2 or 3 views   Meds ordered this encounter  Medications  . predniSONE (STERAPRED UNI-PAK 21 TAB) 10 MG (21) TBPK tablet    Sig: Take as directed    Dispense:  21 tablet    Refill:  0      Procedures: No procedures performed   Clinical Data: No additional findings.   Subjective: Chief Complaint  Patient presents with  . Neck - Pain  . Left Shoulder - Pain    [name] is an 86 year old female who is well-known to me comes in for evaluation of neck and left shoulder pain.  She states that she has pain that radiates from her neck down into her arm with tingling numbness and burning in both her hands worst at night.  She has been taking Tylenol.  Denies any injuries.   Review of Systems  Constitutional: Negative.   HENT: Negative.   Eyes: Negative.   Respiratory: Negative.   Cardiovascular: Negative.   Endocrine: Negative.   Musculoskeletal: Negative.   Neurological: Negative.   Hematological: Negative.   Psychiatric/Behavioral: Negative.   All other systems reviewed and are negative.    Objective: Vital Signs: There were no vitals taken for this visit.  Physical Exam Vitals and nursing note reviewed.  Constitutional:      Appearance: She is  well-developed.  Pulmonary:     Effort: Pulmonary effort is normal.  Skin:    General: Skin is warm.     Capillary Refill: Capillary refill takes less than 2 seconds.  Neurological:     Mental Status: She is alert and oriented to person, place, and time.  Psychiatric:        Behavior: Behavior normal.        Thought Content: Thought content normal.        Judgment: Judgment normal.     Ortho Exam Cervical spine exam shows mild tenderness.  Shoulder movement does not really elicit any pain.  Negative Spurling's. Specialty Comments:  No specialty comments available.  Imaging: XR Cervical Spine 2 or 3 views  Result Date: 02/26/2019 Multilevel cervical spondylosis and degenerative disc disease  XR Shoulder Left  Result Date: 02/26/2019 No acute or structural abnormalities.  Mild degenerative changes appropriate for age    86 History: Patient Active Problem List   Diagnosis Date Noted  . Primary osteoarthritis, right ankle and foot 01/24/2018  . Arthritis of right ankle 08/21/2017  . Allergic urticaria 09/07/2016  . Generalized pruritus 09/07/2016  . Moderate persistent asthma 09/07/2016  . Perennial and seasonal allergic rhinitis 09/07/2016  . Chest pain 01/06/2015  .  Angina at rest [facility]) 01/06/2015  . Diabetes ([place]) 01/06/2015  . A-fib ([place]) 01/06/2015  . HLD (hyperlipidemia) 01/06/2015  . Tobacco abuse 01/06/2015  . Diabetes mellitus with complication ([place])   . Painful total knee replacement ([place]) 01/28/2013  . Atrial fibrillation ([place])   . Left shoulder pain   . Hypertension    Past Medical History:  Diagnosis Date  . Arthritis    "all over"  . Atrial fibrillation ([place])    Diagnosed ~2009  . Breast cancer, left breast ([place]) 1998   S/P chemo and mastectomy   . GERD (gastroesophageal reflux disease)   . Hyperlipidemia   . Hypertension   . Left shoulder pain    "MRI showed pinched nerve" - per pt  . OSA on CPAP   . Pneumonia    "4-5 times" (01/06/2015)   . Type II diabetes mellitus (HCC)     Family History  Problem Relation Age of Onset  . Heart attack Mother        62s  . Emphysema Brother   . Allergic rhinitis Neg Hx   . Angioedema Neg Hx   . Asthma Neg Hx   . Eczema Neg Hx   . Immunodeficiency Neg Hx   . Urticaria Neg Hx     Past Surgical History:  Procedure Laterality Date  . BREAST BIOPSY Left 1998  . CATARACT EXTRACTION W/ INTRAOCULAR LENS  IMPLANT, BILATERAL Bilateral   . JOINT REPLACEMENT    . LAPAROSCOPIC CHOLECYSTECTOMY    . MASTECTOMY Left 1998  . TOTAL KNEE ARTHROPLASTY Left 2001  . TOTAL KNEE REVISION Left 01/28/2013   Procedure: LEFT TOTAL KNEE ARTHROPLASTY REVISION;  Surgeon: Marianna Payment, MD;  Location: [facility];  Service: Orthopedics;  Laterality: Left;   Social History   Occupational History  . Not on file  Tobacco Use  . Smoking status: Never Smoker  . Smokeless tobacco: Current User    Types: Snuff  Substance and Sexual Activity  . Alcohol use: Not Currently    Comment: 01/06/2015 "I'll have a beer q once in awhile"  . Drug use: No  . Sexual activity: Never

## 2019-03-18 ENCOUNTER — Ambulatory Visit: Payer: Medicare Other | Attending: Internal Medicine

## 2019-03-20 ENCOUNTER — Ambulatory Visit: Payer: Medicare Other

## 2019-10-21 ENCOUNTER — Ambulatory Visit (INDEPENDENT_AMBULATORY_CARE_PROVIDER_SITE_OTHER): Payer: Medicare Other | Admitting: Orthopaedic Surgery

## 2019-10-21 ENCOUNTER — Ambulatory Visit (INDEPENDENT_AMBULATORY_CARE_PROVIDER_SITE_OTHER): Payer: Medicare Other

## 2019-10-21 ENCOUNTER — Encounter: Payer: Self-pay | Admitting: Orthopaedic Surgery

## 2019-10-21 DIAGNOSIS — M19071 Primary osteoarthritis, right ankle and foot: Secondary | ICD-10-CM | POA: Diagnosis not present

## 2019-10-21 MED ORDER — BUPIVACAINE HCL 0.5 % IJ SOLN
1.0000 mL | INTRAMUSCULAR | Status: AC | PRN
  Administered 2019-10-21: 1 mL via INTRA_ARTICULAR

## 2019-10-21 MED ORDER — METHYLPREDNISOLONE ACETATE 40 MG/ML IJ SUSP
40.0000 mg | INTRAMUSCULAR | Status: AC | PRN
  Administered 2019-10-21: 40 mg via INTRA_ARTICULAR

## 2019-10-21 MED ORDER — LIDOCAINE HCL 1 % IJ SOLN
1.0000 mL | INTRAMUSCULAR | Status: AC | PRN
  Administered 2019-10-21: 1 mL

## 2019-10-21 NOTE — Progress Notes (Signed)
Office Visit Note   Patient: [name]           Date of Birth: 02/18/1933           MRN: [id number] Visit Date: 10/21/2019              Requested by: Elisabeth Cara, [address] [place],  [place] [address] PCP: Belva Bertin, [place], [place]   Assessment & Plan: Visit Diagnoses:  1. Arthritis of right ankle     Plan: Impression is right ankle DJD.  Cortisone injection repeated today.  Also provide her with an ASO brace for some support.  Follow-up as needed.  Follow-Up Instructions: Return if symptoms worsen or fail to improve.   Orders:  Orders Placed This Encounter  Procedures  . XR Ankle Complete Right   No orders of the defined types were placed in this encounter.     Procedures: Medium Joint Inj: R ankle on 10/21/2019 2:24 PM Indications: pain Details: 25 G needle Medications: 1 mL lidocaine 1 %; 40 mg methylPREDNISolone acetate 40 MG/ML; 1 mL bupivacaine 0.5 % Outcome: tolerated well, no immediate complications Patient was prepped and draped in the usual sterile fashion.       Clinical Data: No additional findings.   Subjective: Chief Complaint  Patient presents with  . Right Ankle - Pain    [name] is a 86 year old female comes in for follow-up of her right ankle DJD.  We saw her about 11 months ago for right ankle DJD.  We did a cortisone injection at that time and she did very well afterwards.  She is requesting another injection.  She is also requesting a brace.  Denies any changes in medical history.   Review of Systems   Objective: Vital Signs: There were no vitals taken for this visit.  Physical Exam  Ortho Exam Right ankle shows mild swelling.  Pain with ankle dorsiflexion and plantarflexion.  Pain with subtalar motion as well. Specialty Comments:  No specialty comments available.  Imaging: XR Ankle Complete Right  Result Date: 10/21/2019 Advanced tibiotalar degenerative arthritis.    PMFS  History: Patient Active Problem List   Diagnosis Date Noted  . Primary osteoarthritis, right ankle and foot 01/24/2018  . Arthritis of right ankle 08/21/2017  . Allergic urticaria 09/07/2016  . Generalized pruritus 09/07/2016  . Moderate persistent asthma 09/07/2016  . Perennial and seasonal allergic rhinitis 09/07/2016  . Chest pain 01/06/2015  . Angina at rest [facility]) 01/06/2015  . Diabetes ([place]) 01/06/2015  . A-fib ([place]) 01/06/2015  . HLD (hyperlipidemia) 01/06/2015  . Tobacco abuse 01/06/2015  . Diabetes mellitus with complication ([place])   . Painful total knee replacement ([place]) 01/28/2013  . Atrial fibrillation ([place])   . Left shoulder pain   . Hypertension    Past Medical History:  Diagnosis Date  . Arthritis    "all over"  . Atrial fibrillation ([place])    Diagnosed ~2009  . Breast cancer, left breast ([place]) 1998   S/P chemo and mastectomy   . GERD (gastroesophageal reflux disease)   . Hyperlipidemia   . Hypertension   . Left shoulder pain    "MRI showed pinched nerve" - per pt  . OSA on CPAP   . Pneumonia    "4-5 times" (01/06/2015)  . Type II diabetes mellitus (HCC)     Family History  Problem Relation Age of Onset  . Heart attack Mother        35s  .  Emphysema Brother   . Allergic rhinitis Neg Hx   . Angioedema Neg Hx   . Asthma Neg Hx   . Eczema Neg Hx   . Immunodeficiency Neg Hx   . Urticaria Neg Hx     Past Surgical History:  Procedure Laterality Date  . BREAST BIOPSY Left 1998  . CATARACT EXTRACTION W/ INTRAOCULAR LENS  IMPLANT, BILATERAL Bilateral   . JOINT REPLACEMENT    . LAPAROSCOPIC CHOLECYSTECTOMY    . MASTECTOMY Left 1998  . TOTAL KNEE ARTHROPLASTY Left 2001  . TOTAL KNEE REVISION Left 01/28/2013   Procedure: LEFT TOTAL KNEE ARTHROPLASTY REVISION;  Surgeon: Marianna Payment, MD;  Location: [facility];  Service: Orthopedics;  Laterality: Left;   Social History   Occupational History  . Not on file  Tobacco Use  . Smoking status: Never Smoker   . Smokeless tobacco: Current User    Types: Snuff  Vaping Use  . Vaping Use: Never used  Substance and Sexual Activity  . Alcohol use: Not Currently    Comment: 01/06/2015 "I'll have a beer q once in awhile"  . Drug use: No  . Sexual activity: Never

## 2020-03-07 ENCOUNTER — Emergency Department (HOSPITAL_BASED_OUTPATIENT_CLINIC_OR_DEPARTMENT_OTHER): Payer: Medicare Other

## 2020-03-07 ENCOUNTER — Emergency Department (HOSPITAL_BASED_OUTPATIENT_CLINIC_OR_DEPARTMENT_OTHER)
Admission: EM | Admit: 2020-03-07 | Discharge: 2020-03-07 | Disposition: A | Discharge status: Home / Self Care | Payer: Medicare Other | Attending: Emergency Medicine | Admitting: Emergency Medicine

## 2020-03-07 ENCOUNTER — Encounter (HOSPITAL_BASED_OUTPATIENT_CLINIC_OR_DEPARTMENT_OTHER): Payer: Self-pay | Admitting: Emergency Medicine

## 2020-03-07 ENCOUNTER — Other Ambulatory Visit: Payer: Self-pay

## 2020-03-07 DIAGNOSIS — J4540 Moderate persistent asthma, uncomplicated: Secondary | ICD-10-CM | POA: Insufficient documentation

## 2020-03-07 DIAGNOSIS — I10 Essential (primary) hypertension: Secondary | ICD-10-CM | POA: Insufficient documentation

## 2020-03-07 DIAGNOSIS — R519 Headache, unspecified: Secondary | ICD-10-CM | POA: Diagnosis not present

## 2020-03-07 DIAGNOSIS — R079 Chest pain, unspecified: Secondary | ICD-10-CM | POA: Diagnosis present

## 2020-03-07 DIAGNOSIS — F17220 Nicotine dependence, chewing tobacco, uncomplicated: Secondary | ICD-10-CM | POA: Diagnosis not present

## 2020-03-07 DIAGNOSIS — Z96652 Presence of left artificial knee joint: Secondary | ICD-10-CM | POA: Insufficient documentation

## 2020-03-07 DIAGNOSIS — Z79899 Other long term (current) drug therapy: Secondary | ICD-10-CM | POA: Insufficient documentation

## 2020-03-07 DIAGNOSIS — I4891 Unspecified atrial fibrillation: Secondary | ICD-10-CM | POA: Diagnosis not present

## 2020-03-07 DIAGNOSIS — Z853 Personal history of malignant neoplasm of breast: Secondary | ICD-10-CM | POA: Insufficient documentation

## 2020-03-07 DIAGNOSIS — Z91010 Allergy to peanuts: Secondary | ICD-10-CM | POA: Diagnosis not present

## 2020-03-07 DIAGNOSIS — H9202 Otalgia, left ear: Secondary | ICD-10-CM | POA: Diagnosis not present

## 2020-03-07 DIAGNOSIS — Z7951 Long term (current) use of inhaled steroids: Secondary | ICD-10-CM | POA: Diagnosis not present

## 2020-03-07 DIAGNOSIS — Z7901 Long term (current) use of anticoagulants: Secondary | ICD-10-CM | POA: Insufficient documentation

## 2020-03-07 DIAGNOSIS — E119 Type 2 diabetes mellitus without complications: Secondary | ICD-10-CM | POA: Insufficient documentation

## 2020-03-07 LAB — CBC
HCT: 34.3 % — ABNORMAL LOW (ref 36.0–46.0)
Hemoglobin: 11 g/dL — ABNORMAL LOW (ref 12.0–15.0)
MCH: 28.6 pg (ref 26.0–34.0)
MCHC: 32.1 g/dL (ref 30.0–36.0)
MCV: 89.1 fL (ref 80.0–100.0)
Platelets: 297 10*3/uL (ref 150–400)
RBC: 3.85 MIL/uL — ABNORMAL LOW (ref 3.87–5.11)
RDW: 14.7 % (ref 11.5–15.5)
WBC: 6.1 10*3/uL (ref 4.0–10.5)
nRBC: 0 % (ref 0.0–0.2)

## 2020-03-07 LAB — BASIC METABOLIC PANEL
Anion gap: 10 (ref 5–15)
BUN: 9 mg/dL (ref 8–23)
CO2: 22 mmol/L (ref 22–32)
Calcium: 9.3 mg/dL (ref 8.9–10.3)
Chloride: 103 mmol/L (ref 98–111)
Creatinine, Ser: 0.88 mg/dL (ref 0.44–1.00)
GFR, Estimated: >60 mL/min (ref >60)
Glucose, Bld: 120 mg/dL — ABNORMAL HIGH (ref 70–99)
Potassium: 3.8 mmol/L (ref 3.5–5.1)
Sodium: 135 mmol/L (ref 135–145)

## 2020-03-07 LAB — TROPONIN I (HIGH SENSITIVITY): Troponin I (High Sensitivity): 6 ng/L (ref <18)

## 2020-03-07 NOTE — ED Triage Notes (Signed)
Intermittent L side chest pain, radiating into her neck and L arm x 1 week. Endorses cough. States the pain worsens when she eats and it feels like her food gets blocked.

## 2020-03-07 NOTE — ED Provider Notes (Signed)
[facility] EMERGENCY DEPARTMENT Provider Note   CSN: [id number] Arrival date & time: 03/07/20  1107     History Chief Complaint  Patient presents with  . Chest Pain    [name] is a 87 y.o. female.  HPI Patient presents with left-sided chest pain.  Goes from left chest to left neck to left arm and up to left ear.  Comes and goes.  States she has had it for the last week but also states she has had it before and it had other episodes of it in the past.  No fevers.  Does not go on exertion.  States eating does seem to make the pain worse and sometimes feels as if the food gets stuck in her chest.  Does have a history of A. fib and is on anticoagulation.  No cough.  No swelling in her legs.  Pain is dull.  Also gets headaches at night.    Past Medical History:  Diagnosis Date  . Arthritis    "all over"  . Atrial fibrillation ([place])    Diagnosed ~2009  . Breast cancer, left breast ([place]) 1998   S/P chemo and mastectomy   . GERD (gastroesophageal reflux disease)   . Hyperlipidemia   . Hypertension   . Left shoulder pain    "MRI showed pinched nerve" - per pt  . OSA on CPAP   . Pneumonia    "4-5 times" (01/06/2015)  . Type II diabetes mellitus [facility])     Patient Active Problem List   Diagnosis Date Noted  . Primary osteoarthritis, right ankle and foot 01/24/2018  . Arthritis of right ankle 08/21/2017  . Allergic urticaria 09/07/2016  . Generalized pruritus 09/07/2016  . Moderate persistent asthma 09/07/2016  . Perennial and seasonal allergic rhinitis 09/07/2016  . Chest pain 01/06/2015  . Angina at rest [facility]) 01/06/2015  . Diabetes ([place]) 01/06/2015  . A-fib ([place]) 01/06/2015  . HLD (hyperlipidemia) 01/06/2015  . Tobacco abuse 01/06/2015  . Diabetes mellitus with complication ([place])   . Painful total knee replacement ([place]) 01/28/2013  . Atrial fibrillation ([place])   . Left shoulder pain   . Hypertension     Past Surgical History:  Procedure  Laterality Date  . BREAST BIOPSY Left 1998  . CATARACT EXTRACTION W/ INTRAOCULAR LENS  IMPLANT, BILATERAL Bilateral   . JOINT REPLACEMENT    . LAPAROSCOPIC CHOLECYSTECTOMY    . MASTECTOMY Left 1998  . TOTAL KNEE ARTHROPLASTY Left 2001  . TOTAL KNEE REVISION Left 01/28/2013   Procedure: LEFT TOTAL KNEE ARTHROPLASTY REVISION;  Surgeon: Marianna Payment, MD;  Location: [facility];  Service: Orthopedics;  Laterality: Left;     OB History   No obstetric history on file.     Family History  Problem Relation Age of Onset  . Heart attack Mother        70s  . Emphysema Brother   . Allergic rhinitis Neg Hx   . Angioedema Neg Hx   . Asthma Neg Hx   . Eczema Neg Hx   . Immunodeficiency Neg Hx   . Urticaria Neg Hx     Social History   Tobacco Use  . Smoking status: Never Smoker  . Smokeless tobacco: Current User    Types: Snuff  Vaping Use  . Vaping Use: Never used  Substance Use Topics  . Alcohol use: Not Currently    Comment: 01/06/2015 "I'll have a beer q once in awhile"  . Drug use: No  Home Medications Prior to Admission medications   Medication Sig Start Date End Date Taking? Authorizing Provider  albuterol (PROVENTIL HFA;VENTOLIN HFA) 108 (90 Base) MCG/ACT inhaler Inhale 1-2 puffs into the lungs every 4 (four) hours as needed for wheezing or shortness of breath. 09/07/16   Bobbitt, Sedalia Muta, MD  amLODipine (NORVASC) 10 MG tablet Take 10 mg by mouth. 08/10/16   [provider]  Cholecalciferol (VITAMIN D) 2000 units tablet Frequency:daily   Dosage:2000   UNIT  Instructions:D3 Dots (2000UNIT TBDP, Oral daily)  Note:    [provider]  FLOVENT HFA 110 MCG/ACT inhaler TAKE 2 PUFFS BY MOUTH TWICE A DAY 03/21/17   Bobbitt, Sedalia Muta, MD  fluticasone [facility]) 50 MCG/ACT nasal spray Use 1 spray per nostril 1-2 times daily as needed 09/07/16   Bobbitt, Sedalia Muta, MD  furosemide (LASIX) 20 MG tablet Take 20 mg by mouth. 06/14/16   [provider]   levocetirizine (XYZAL) 5 MG tablet TAKE 1 TABLET BY MOUTH EVERY DAY IN THE EVENING 03/14/17   Bobbitt, Sedalia Muta, MD  lisinopril (PRINIVIL,ZESTRIL) 10 MG tablet Take 10 mg by mouth daily.    [provider]  montelukast (SINGULAIR) 10 MG tablet TAKE 1 TABLET BY MOUTH EVERYDAY AT BEDTIME 03/14/17   Bobbitt, Sedalia Muta, MD  omeprazole (PRILOSEC) 20 MG capsule Take 1 capsule (20 mg total) by mouth daily. 06/22/18   Quintella Reichert, MD  predniSONE (STERAPRED UNI-PAK 21 TAB) 10 MG (21) TBPK tablet Take as directed 02/26/19   Leandrew Koyanagi, MD  rivaroxaban (XARELTO) 20 MG TABS tablet Take 20 mg by mouth every morning.     [provider]    Allergies    Chocolate, Lisinopril, and Peanuts [peanut oil]  Review of Systems   Review of Systems  Constitutional: Negative for appetite change and fever.  HENT: Positive for ear pain. Negative for congestion.   Respiratory: Negative for cough.   Cardiovascular: Positive for chest pain.  Gastrointestinal: Negative for abdominal pain.  Genitourinary: Negative for flank pain.  Musculoskeletal: Negative for back pain and neck pain.  Skin: Negative for rash.  Neurological: Positive for headaches.  Psychiatric/Behavioral: Negative for confusion.    Physical Exam Updated Vital Signs BP 115/71   Pulse 71   Temp 98.7 F (37.1 C) (Oral)   Resp 16   Ht 5\' 4"  (1.626 m)   Wt 68 kg   SpO2 99%   BMI 25.75 kg/m   Physical Exam Vitals and nursing note reviewed.  HENT:     Head: Normocephalic.     Comments: Left TM without erythema or bulging.  No effusion. Eyes:     Extraocular Movements: Extraocular movements intact.  Neck:     Vascular: JVD present.  Cardiovascular:     Rate and Rhythm: Normal rate. Rhythm irregular.  Pulmonary:     Breath sounds: No wheezing, rhonchi or rales.  Chest:     Chest wall: No tenderness.  Abdominal:     Tenderness: There is no abdominal tenderness.  Musculoskeletal:     Cervical back: Neck  supple.     Right lower leg: No tenderness. No edema.     Left lower leg: No tenderness. No edema.  Skin:    General: Skin is warm.     Capillary Refill: Capillary refill takes less than 2 seconds.  Neurological:     Mental Status: She is alert and oriented to person, place, and time.     ED Results / Procedures /  Treatments   Labs (all labs ordered are listed, but only abnormal results are displayed) Labs Reviewed  CBC - Abnormal; Notable for the following components:      Result Value   RBC 3.85 (*)    Hemoglobin 11.0 (*)    HCT 34.3 (*)    All other components within normal limits  BASIC METABOLIC PANEL - Abnormal; Notable for the following components:   Glucose, Bld 120 (*)    All other components within normal limits  TROPONIN I (HIGH SENSITIVITY)  TROPONIN I (HIGH SENSITIVITY)    EKG EKG Interpretation  Date/Time:  Sunday March 07 2020 11:16:10 EST Ventricular Rate:  90 PR Interval:    QRS Duration: 103 QT Interval:  368 QTC Calculation: 451 R Axis:   111 Text Interpretation: Atrial fibrillation Right axis deviation Borderline low voltage, extremity leads Probable anteroseptal infarct, old Baseline wander in lead(s) V1 Confirmed by Davonna Belling [phone]) on 03/07/2020 11:19:52 AM   Radiology DG Chest Portable 1 View  Result Date: 03/07/2020 CLINICAL DATA:  Left chest pain radiating to the neck and left arm for the past week. EXAM: PORTABLE CHEST 1 VIEW COMPARISON:  04/25/2019 FINDINGS: Stable enlarged cardiac silhouette and tortuous thoracic aorta. Clear lungs with normal vascularity. Stable prominence of the interstitial markings without Kerley lines. Interval small amount of right pleural thickening or fluid. Mild scoliosis. Left axillary surgical clips. IMPRESSION: 1. Interval small amount of right pleural thickening or fluid. 2. Stable cardiomegaly and mild chronic interstitial lung disease. Electronically Signed   By: Claudie Revering M.D.   On: 03/07/2020 12:27     Procedures Procedures   Medications Ordered in ED Medications - No data to display  ED Course  I have reviewed the triage vital signs and the nursing notes.  Pertinent labs & imaging results that were available during my care of the patient were reviewed by me and considered in my medical decision making (see chart for details).    MDM Rules/Calculators/A&P                          Patient presents with left-sided chest pain.  From left chest to arm to neck at times.  Worse after eating.  Not exertional.  Reviewing notes appears of had pains like this before thought to be noncardiac cause.  EKG stable.  Troponin negative.  Has had pain for a couple weeks now.  Doubt cardiac cause of the pain.  Doubt aortic dissection.  The fact that food feels as if it gets stuck and worsens pain potentially could have a GI cause.  Will have patient follow-up as an outpatient.  PCP and possibly GI follow-up.  Discharge home Final Clinical Impression(s) / ED Diagnoses Final diagnoses:  Nonspecific chest pain    Rx / DC Orders ED Discharge Orders    None       Davonna Belling, MD 03/07/20 1325

## 2020-03-07 NOTE — Discharge Instructions (Addendum)
Follow-up with your doctor.  You also may need to follow-up with gastroenterology since you have been having difficulty with swallowing/feeling the food gets stuck in your chest.

## 2020-03-07 NOTE — Progress Notes (Signed)
Pt with inconsistent reading on pulse ox. Pt nail polish removed from right index finger. SpO2 now reading 98-100% with a good pleth. Pt voices no complaints at this time, RT will continue to monitor and be available as needed.

## 2020-05-08 ENCOUNTER — Emergency Department (HOSPITAL_BASED_OUTPATIENT_CLINIC_OR_DEPARTMENT_OTHER): Payer: Medicare Other

## 2020-05-08 ENCOUNTER — Other Ambulatory Visit: Payer: Self-pay

## 2020-05-08 ENCOUNTER — Emergency Department (HOSPITAL_BASED_OUTPATIENT_CLINIC_OR_DEPARTMENT_OTHER)
Admission: EM | Admit: 2020-05-08 | Discharge: 2020-05-08 | Disposition: A | Discharge status: Home / Self Care | Payer: Medicare Other | Attending: Emergency Medicine | Admitting: Emergency Medicine

## 2020-05-08 ENCOUNTER — Encounter (HOSPITAL_BASED_OUTPATIENT_CLINIC_OR_DEPARTMENT_OTHER): Payer: Self-pay | Admitting: Emergency Medicine

## 2020-05-08 DIAGNOSIS — Z853 Personal history of malignant neoplasm of breast: Secondary | ICD-10-CM | POA: Diagnosis not present

## 2020-05-08 DIAGNOSIS — Z7901 Long term (current) use of anticoagulants: Secondary | ICD-10-CM | POA: Diagnosis not present

## 2020-05-08 DIAGNOSIS — I509 Heart failure, unspecified: Secondary | ICD-10-CM

## 2020-05-08 DIAGNOSIS — I5023 Acute on chronic systolic (congestive) heart failure: Secondary | ICD-10-CM | POA: Diagnosis not present

## 2020-05-08 DIAGNOSIS — R0602 Shortness of breath: Secondary | ICD-10-CM | POA: Diagnosis present

## 2020-05-08 DIAGNOSIS — Z9221 Personal history of antineoplastic chemotherapy: Secondary | ICD-10-CM | POA: Insufficient documentation

## 2020-05-08 DIAGNOSIS — Z96652 Presence of left artificial knee joint: Secondary | ICD-10-CM | POA: Diagnosis not present

## 2020-05-08 DIAGNOSIS — E785 Hyperlipidemia, unspecified: Secondary | ICD-10-CM | POA: Insufficient documentation

## 2020-05-08 DIAGNOSIS — I110 Hypertensive heart disease with heart failure: Secondary | ICD-10-CM | POA: Insufficient documentation

## 2020-05-08 DIAGNOSIS — E1169 Type 2 diabetes mellitus with other specified complication: Secondary | ICD-10-CM | POA: Insufficient documentation

## 2020-05-08 DIAGNOSIS — Z79899 Other long term (current) drug therapy: Secondary | ICD-10-CM | POA: Diagnosis not present

## 2020-05-08 DIAGNOSIS — J45909 Unspecified asthma, uncomplicated: Secondary | ICD-10-CM | POA: Diagnosis not present

## 2020-05-08 DIAGNOSIS — Z91010 Allergy to peanuts: Secondary | ICD-10-CM | POA: Insufficient documentation

## 2020-05-08 LAB — URINALYSIS, ROUTINE W REFLEX MICROSCOPIC
Bilirubin Urine: NEGATIVE
Glucose, UA: NEGATIVE mg/dL
Hgb urine dipstick: NEGATIVE
Ketones, ur: NEGATIVE mg/dL
Leukocytes,Ua: NEGATIVE
Nitrite: NEGATIVE
Protein, ur: NEGATIVE mg/dL
Specific Gravity, Urine: <1.005 — ABNORMAL LOW (ref 1.005–1.030)
pH: 6 (ref 5.0–8.0)

## 2020-05-08 LAB — CBC WITH DIFFERENTIAL/PLATELET
Abs Immature Granulocytes: 0.03 10*3/uL (ref 0.00–0.07)
Basophils Absolute: 0 10*3/uL (ref 0.0–0.1)
Basophils Relative: 0 %
Eosinophils Absolute: 0.1 10*3/uL (ref 0.0–0.5)
Eosinophils Relative: 1 %
HCT: 30.8 % — ABNORMAL LOW (ref 36.0–46.0)
Hemoglobin: 9.8 g/dL — ABNORMAL LOW (ref 12.0–15.0)
Immature Granulocytes: 0 %
Lymphocytes Relative: 16 %
Lymphs Abs: 1.5 10*3/uL (ref 0.7–4.0)
MCH: 28.2 pg (ref 26.0–34.0)
MCHC: 31.8 g/dL (ref 30.0–36.0)
MCV: 88.8 fL (ref 80.0–100.0)
Monocytes Absolute: 0.7 10*3/uL (ref 0.1–1.0)
Monocytes Relative: 7 %
Neutro Abs: 7.1 10*3/uL (ref 1.7–7.7)
Neutrophils Relative %: 76 %
Platelets: 269 10*3/uL (ref 150–400)
RBC: 3.47 MIL/uL — ABNORMAL LOW (ref 3.87–5.11)
RDW: 15.5 % (ref 11.5–15.5)
WBC: 9.4 10*3/uL (ref 4.0–10.5)
nRBC: 0 % (ref 0.0–0.2)

## 2020-05-08 LAB — BRAIN NATRIURETIC PEPTIDE: B Natriuretic Peptide: 385.7 pg/mL — ABNORMAL HIGH (ref 0.0–100.0)

## 2020-05-08 LAB — BASIC METABOLIC PANEL
Anion gap: 10 (ref 5–15)
BUN: 16 mg/dL (ref 8–23)
CO2: 21 mmol/L — ABNORMAL LOW (ref 22–32)
Calcium: 9.2 mg/dL (ref 8.9–10.3)
Chloride: 104 mmol/L (ref 98–111)
Creatinine, Ser: 0.88 mg/dL (ref 0.44–1.00)
GFR, Estimated: >60 mL/min (ref >60)
Glucose, Bld: 99 mg/dL (ref 70–99)
Potassium: 3.8 mmol/L (ref 3.5–5.1)
Sodium: 135 mmol/L (ref 135–145)

## 2020-05-08 LAB — TROPONIN I (HIGH SENSITIVITY): Troponin I (High Sensitivity): 7 ng/L (ref <18)

## 2020-05-08 MED ORDER — FUROSEMIDE 20 MG PO TABS
20.0000 mg | ORAL_TABLET | Freq: Once | ORAL | Status: AC
  Administered 2020-05-08: 20 mg via ORAL
  Filled 2020-05-08: qty 1

## 2020-05-08 MED ORDER — FUROSEMIDE 20 MG PO TABS: 20.0000 mg | ORAL_TABLET | Freq: Every day | ORAL | 0 refills | Status: DC

## 2020-05-08 MED ORDER — FUROSEMIDE 10 MG/ML IJ SOLN
20.0000 mg | Freq: Once | INTRAMUSCULAR | Status: DC
  Filled 2020-05-08: qty 2

## 2020-05-08 NOTE — Discharge Instructions (Addendum)
Your work-up today is.  You do appear to have a mild congestive heart failure exacerbation.  I prescribed you a short course of furosemide/Lasix that can cover you until you are evaluated by your primary care provider.  I encourage you to follow-up with them at some point next week for ongoing evaluation and management.  Return to the ER or seek immediate medical attention should you experience any new or worsening symptoms.

## 2020-05-08 NOTE — ED Provider Notes (Signed)
[facility] EMERGENCY DEPARTMENT Provider Note   CSN: [id number] Arrival date & time: 05/08/20  1543     History Chief Complaint  Patient presents with  . Shortness of Breath    [name] is a 87 y.o. female with past medical history significant for atrial fibrillation on Xarelto, HTN, HLD, and asthma who presents the ED with complaints of shortness of breath.  She is accompanied by her daughter who is at bedside.  Daughter states that she was sent here from urgent care after she was noted to have pneumonia given their inability to obtain laboratory work-up.    On my examination, patient reports that for the past week she has been feeling weak, short of breath, and with a cough.  She states that she just got home after traveling to [place], [place] for a couple weeks.  She has been taking all of her medications, including her Xarelto, as prescribed.  She endorses a left-sided chest wall and abdominal discomfort, but states that this is chronic and unchanged from her baseline.    She denies any new or different chest pain, palpitations, dizziness, falls, hemoptysis, nausea or vomiting, diaphoresis, or other symptoms.   She sleeps on her right side, denies any new or worsening orthopnea.  She also denies any weight gain and instead argues that she is lost a little bit of weight.  HPI     Past Medical History:  Diagnosis Date  . Arthritis    "all over"  . Atrial fibrillation ([place])    Diagnosed ~2009  . Breast cancer, left breast ([place]) 1998   S/P chemo and mastectomy   . GERD (gastroesophageal reflux disease)   . Hyperlipidemia   . Hypertension   . Left shoulder pain    "MRI showed pinched nerve" - per pt  . OSA on CPAP   . Pneumonia    "4-5 times" (01/06/2015)  . Type II diabetes mellitus [facility])     Patient Active Problem List   Diagnosis Date Noted  . Primary osteoarthritis, right ankle and foot 01/24/2018  . Arthritis of right ankle  08/21/2017  . Allergic urticaria 09/07/2016  . Generalized pruritus 09/07/2016  . Moderate persistent asthma 09/07/2016  . Perennial and seasonal allergic rhinitis 09/07/2016  . Chest pain 01/06/2015  . Angina at rest [facility]) 01/06/2015  . Diabetes ([place]) 01/06/2015  . A-fib ([place]) 01/06/2015  . HLD (hyperlipidemia) 01/06/2015  . Tobacco abuse 01/06/2015  . Diabetes mellitus with complication ([place])   . Painful total knee replacement ([place]) 01/28/2013  . Atrial fibrillation ([place])   . Left shoulder pain   . Hypertension     Past Surgical History:  Procedure Laterality Date  . BREAST BIOPSY Left 1998  . CATARACT EXTRACTION W/ INTRAOCULAR LENS  IMPLANT, BILATERAL Bilateral   . JOINT REPLACEMENT    . LAPAROSCOPIC CHOLECYSTECTOMY    . MASTECTOMY Left 1998  . TOTAL KNEE ARTHROPLASTY Left 2001  . TOTAL KNEE REVISION Left 01/28/2013   Procedure: LEFT TOTAL KNEE ARTHROPLASTY REVISION;  Surgeon: Marianna Payment, MD;  Location: [facility];  Service: Orthopedics;  Laterality: Left;     OB History   No obstetric history on file.     Family History  Problem Relation Age of Onset  . Heart attack Mother        60s  . Emphysema Brother   . Allergic rhinitis Neg Hx   . Angioedema Neg Hx   . Asthma Neg Hx   .  Eczema Neg Hx   . Immunodeficiency Neg Hx   . Urticaria Neg Hx     Social History   Tobacco Use  . Smoking status: Never Smoker  . Smokeless tobacco: Current User    Types: Snuff  Vaping Use  . Vaping Use: Never used  Substance Use Topics  . Alcohol use: Not Currently    Comment: 01/06/2015 "I'll have a beer q once in awhile"  . Drug use: No    Home Medications Prior to Admission medications   Medication Sig Start Date End Date Taking? Authorizing Provider  furosemide (LASIX) 20 MG tablet Take 1 tablet (20 mg total) by mouth daily. 05/08/20 06/07/20 Yes Corena Herter, PA-C  albuterol (PROVENTIL HFA;VENTOLIN HFA) 108 (90 Base) MCG/ACT inhaler Inhale 1-2 puffs into the lungs  every 4 (four) hours as needed for wheezing or shortness of breath. 09/07/16   Bobbitt, Sedalia Muta, MD  amLODipine (NORVASC) 10 MG tablet Take 10 mg by mouth. 08/10/16   [provider]  Cholecalciferol (VITAMIN D) 2000 units tablet Frequency:daily   Dosage:2000   UNIT  Instructions:D3 Dots (2000UNIT TBDP, Oral daily)  Note:    [provider]  FLOVENT HFA 110 MCG/ACT inhaler TAKE 2 PUFFS BY MOUTH TWICE A DAY 03/21/17   Bobbitt, Sedalia Muta, MD  fluticasone [facility]) 50 MCG/ACT nasal spray Use 1 spray per nostril 1-2 times daily as needed 09/07/16   Bobbitt, Sedalia Muta, MD  levocetirizine (XYZAL) 5 MG tablet TAKE 1 TABLET BY MOUTH EVERY DAY IN THE EVENING 03/14/17   Bobbitt, Sedalia Muta, MD  lisinopril (PRINIVIL,ZESTRIL) 10 MG tablet Take 10 mg by mouth daily.    [provider]  montelukast (SINGULAIR) 10 MG tablet TAKE 1 TABLET BY MOUTH EVERYDAY AT BEDTIME 03/14/17   Bobbitt, Sedalia Muta, MD  omeprazole (PRILOSEC) 20 MG capsule Take 1 capsule (20 mg total) by mouth daily. 06/22/18   Quintella Reichert, MD  predniSONE (STERAPRED UNI-PAK 21 TAB) 10 MG (21) TBPK tablet Take as directed 02/26/19   Leandrew Koyanagi, MD  rivaroxaban (XARELTO) 20 MG TABS tablet Take 20 mg by mouth every morning.     [provider]    Allergies    Chocolate, Lisinopril, and Peanuts [peanut oil]  Review of Systems   Review of Systems  All other systems reviewed and are negative.   Physical Exam Updated Vital Signs BP 115/78   Pulse 94   Temp (!) 97 F (36.1 C) (Oral)   Resp 19   Ht 5\' 4"  (1.626 m)   Wt 67.6 kg   SpO2 96%   BMI 25.58 kg/m   Physical Exam Vitals and nursing note reviewed. Exam conducted with a chaperone present.  Constitutional:      General: She is not in acute distress.    Appearance: She is not toxic-appearing.  HENT:     Head: Normocephalic and atraumatic.  Eyes:     General: No scleral icterus.    Conjunctiva/sclera: Conjunctivae normal.   Cardiovascular:     Rate and Rhythm: Normal rate.     Pulses: Normal pulses.  Pulmonary:     Effort: Pulmonary effort is normal. No respiratory distress.     Breath sounds: No stridor. No wheezing or rales.     Comments: No significant increased work of breathing.  Speaks in full sentences.  Borderline tachypnea to 20.  98% on room air.  No wheezing.  No significant rales.  Mild rhonchi bilaterally. Abdominal:  General: Abdomen is flat. There is no distension.     Palpations: Abdomen is soft.     Tenderness: There is no abdominal tenderness.  Musculoskeletal:     Right lower leg: Edema present.     Left lower leg: Edema present.     Comments: Bilateral extremity edema, worse on right side (chronic).  Pedal pulses intact and symmetric.  Skin:    General: Skin is dry.  Neurological:     Mental Status: She is alert and oriented to person, place, and time.     GCS: GCS eye subscore is 4. GCS verbal subscore is 5. GCS motor subscore is 6.  Psychiatric:        Mood and Affect: Mood normal.        Behavior: Behavior normal.        Thought Content: Thought content normal.     ED Results / Procedures / Treatments   Labs (all labs ordered are listed, but only abnormal results are displayed) Labs Reviewed  CBC WITH DIFFERENTIAL/PLATELET - Abnormal; Notable for the following components:      Result Value   RBC 3.47 (*)    Hemoglobin 9.8 (*)    HCT 30.8 (*)    All other components within normal limits  BASIC METABOLIC PANEL - Abnormal; Notable for the following components:   CO2 21 (*)    All other components within normal limits  BRAIN NATRIURETIC PEPTIDE - Abnormal; Notable for the following components:   B Natriuretic Peptide 385.7 (*)    All other components within normal limits  URINALYSIS, ROUTINE W REFLEX MICROSCOPIC - Abnormal; Notable for the following components:   Color, Urine STRAW (*)    Specific Gravity, Urine <1.005 (*)    All other components within normal limits   TROPONIN I (HIGH SENSITIVITY)    EKG EKG Interpretation  Date/Time:  Saturday May 08 2020 15:59:19 EDT Ventricular Rate:  57 PR Interval:    QRS Duration: 81 QT Interval:  545 QTC Calculation: 531 R Axis:   132 Text Interpretation: Atrial fibrillation Right axis deviation Low voltage, extremity leads Probable anteroseptal infarct, old Nonspecific T abnormalities, lateral leads Prolonged QT interval No significant change since last tracing Confirmed by Gareth Morgan [phone]) on 05/08/2020 4:06:26 PM   Radiology DG Chest Portable 1 View  Result Date: 05/08/2020 CLINICAL DATA:  87 year old female with shortness of breath and cough. EXAM: PORTABLE CHEST 1 VIEW COMPARISON:  Chest radiograph dated 05/08/2020. FINDINGS: There is cardiomegaly with mild vascular congestion. No edema. No focal consolidation, pleural effusion, pneumothorax. Atherosclerotic calcification of the aorta. No acute osseous pathology. Left chest wall surgical clips. IMPRESSION: Cardiomegaly with mild vascular congestion. No focal consolidation. Electronically Signed   By: Anner Crete M.D.   On: 05/08/2020 17:06    Procedures Procedures   Medications Ordered in ED Medications  furosemide (LASIX) tablet 20 mg (20 mg Oral Given 05/08/20 1906)    ED Course  I have reviewed the triage vital signs and the nursing notes.  Pertinent labs & imaging results that were available during my care of the patient were reviewed by me and considered in my medical decision making (see chart for details).    MDM Rules/Calculators/A&P                          LESTINE RAHE was evaluated in Emergency Department on 05/08/2020 for the symptoms described in the history of present illness. She was evaluated  in the context of the global COVID-19 pandemic, which necessitated consideration that the patient might be at risk for infection with the SARS-CoV-2 virus that causes COVID-19. Institutional protocols and algorithms that  pertain to the evaluation of patients at risk for COVID-19 are in a state of rapid change based on information released by regulatory bodies including the CDC and federal and state organizations. These policies and algorithms were followed during the patient's care in the ED.  I personally reviewed patient's medical chart and all notes from triage and staff during today's encounter. I have also ordered and reviewed all labs and imaging that I felt to be medically necessary in the evaluation of this patient's complaints and with consideration of their physical exam. If needed, translation services were available and utilized.   Patient in the ED for weakness, shortness of breath, nonproductive cough.  She has been afebrile.  She was told she had pneumonia, but chest x-ray obtained here in the ED is personally reviewed and demonstrates pulmonary congestion, but without any focal consolidation concerning for pneumonia.    I asked patient if she had been taking her Lasix 20 mg daily, but she states that she has not taken it in a few months.  BNP is elevated to 385.7, increased from labs obtained compared to most recent labs 2 years ago.  She does have peripheral edema on exam and suspect that her shortness of breath and weakness is in context of congestive heart failure.  This is a very mild CHF exacerbation I do not feel as though she requires hospitalization.  We will give 20 mg of furosemide IV here in the ED and then reassess.  She is speaking in full sentences and not demonstrating any increased work of breathing.  She is 97% on room air and without any tachypnea or tachycardia.  UA, BMP, and troponin all entirely unremarkable.  CBC only notable for mild drop in hemoglobin from 11 to 9.8.  She denies any melena.  Follow-up with her primary care provider.  She is hemodynamically stable I do not feel that symptomatic anemia is the cause for her shortness of breath and cough.  She already has viral testing  obtained and pending from urgent care.  She can follow-up closely with her primary care provider.  ED return precautions discussed.  Patient and daughter at bedside voiced understanding and are agreeable to the plan.  Final Clinical Impression(s) / ED Diagnoses Final diagnoses:  Acute on chronic congestive heart failure, unspecified heart failure type [facility])    Rx / DC Orders ED Discharge Orders         Ordered    furosemide (LASIX) 20 MG tablet  Daily        05/08/20 1924           Reita Chard 05/08/20 1924    Gareth Morgan, MD 05/10/20 1229

## 2020-05-08 NOTE — ED Triage Notes (Signed)
Pt c/o SHOB, weakness and cough

## 2020-05-08 NOTE — ED Notes (Signed)
Unable to obtain IV x 2; labs obtained and sent

## 2020-07-03 ENCOUNTER — Observation Stay (HOSPITAL_COMMUNITY): Payer: Medicare Other

## 2020-07-03 ENCOUNTER — Encounter (HOSPITAL_BASED_OUTPATIENT_CLINIC_OR_DEPARTMENT_OTHER): Payer: Self-pay | Admitting: Emergency Medicine

## 2020-07-03 ENCOUNTER — Other Ambulatory Visit: Payer: Self-pay

## 2020-07-03 ENCOUNTER — Emergency Department (HOSPITAL_BASED_OUTPATIENT_CLINIC_OR_DEPARTMENT_OTHER): Payer: Medicare Other

## 2020-07-03 ENCOUNTER — Inpatient Hospital Stay (HOSPITAL_BASED_OUTPATIENT_CLINIC_OR_DEPARTMENT_OTHER)
Admission: EM | Admit: 2020-07-03 | Discharge: 2020-07-09 | DRG: 287 | Disposition: A | Discharge status: Home / Self Care | Payer: Medicare Other | Attending: Cardiovascular Disease | Admitting: Cardiovascular Disease

## 2020-07-03 DIAGNOSIS — Z720 Tobacco use: Secondary | ICD-10-CM

## 2020-07-03 DIAGNOSIS — E785 Hyperlipidemia, unspecified: Secondary | ICD-10-CM | POA: Diagnosis present

## 2020-07-03 DIAGNOSIS — E440 Moderate protein-calorie malnutrition: Secondary | ICD-10-CM | POA: Insufficient documentation

## 2020-07-03 DIAGNOSIS — M7989 Other specified soft tissue disorders: Secondary | ICD-10-CM | POA: Diagnosis not present

## 2020-07-03 DIAGNOSIS — Z853 Personal history of malignant neoplasm of breast: Secondary | ICD-10-CM

## 2020-07-03 DIAGNOSIS — Z8249 Family history of ischemic heart disease and other diseases of the circulatory system: Secondary | ICD-10-CM

## 2020-07-03 DIAGNOSIS — I200 Unstable angina: Secondary | ICD-10-CM | POA: Diagnosis present

## 2020-07-03 DIAGNOSIS — Z825 Family history of asthma and other chronic lower respiratory diseases: Secondary | ICD-10-CM

## 2020-07-03 DIAGNOSIS — K219 Gastro-esophageal reflux disease without esophagitis: Secondary | ICD-10-CM | POA: Diagnosis present

## 2020-07-03 DIAGNOSIS — R7303 Prediabetes: Secondary | ICD-10-CM | POA: Diagnosis present

## 2020-07-03 DIAGNOSIS — Z91018 Allergy to other foods: Secondary | ICD-10-CM

## 2020-07-03 DIAGNOSIS — I472 Ventricular tachycardia: Secondary | ICD-10-CM | POA: Diagnosis not present

## 2020-07-03 DIAGNOSIS — M19071 Primary osteoarthritis, right ankle and foot: Secondary | ICD-10-CM | POA: Diagnosis not present

## 2020-07-03 DIAGNOSIS — Z9221 Personal history of antineoplastic chemotherapy: Secondary | ICD-10-CM | POA: Diagnosis not present

## 2020-07-03 DIAGNOSIS — Z6825 Body mass index (BMI) 25.0-25.9, adult: Secondary | ICD-10-CM | POA: Diagnosis not present

## 2020-07-03 DIAGNOSIS — Z79899 Other long term (current) drug therapy: Secondary | ICD-10-CM | POA: Diagnosis not present

## 2020-07-03 DIAGNOSIS — I25111 Atherosclerotic heart disease of native coronary artery with angina pectoris with documented spasm: Principal | ICD-10-CM | POA: Diagnosis present

## 2020-07-03 DIAGNOSIS — J4540 Moderate persistent asthma, uncomplicated: Secondary | ICD-10-CM | POA: Diagnosis not present

## 2020-07-03 DIAGNOSIS — N1831 Chronic kidney disease, stage 3a: Secondary | ICD-10-CM | POA: Diagnosis present

## 2020-07-03 DIAGNOSIS — Z888 Allergy status to other drugs, medicaments and biological substances status: Secondary | ICD-10-CM

## 2020-07-03 DIAGNOSIS — I10 Essential (primary) hypertension: Secondary | ICD-10-CM | POA: Diagnosis present

## 2020-07-03 DIAGNOSIS — D509 Iron deficiency anemia, unspecified: Secondary | ICD-10-CM | POA: Diagnosis not present

## 2020-07-03 DIAGNOSIS — Z20822 Contact with and (suspected) exposure to covid-19: Secondary | ICD-10-CM | POA: Diagnosis present

## 2020-07-03 DIAGNOSIS — R079 Chest pain, unspecified: Secondary | ICD-10-CM

## 2020-07-03 DIAGNOSIS — Z8701 Personal history of pneumonia (recurrent): Secondary | ICD-10-CM

## 2020-07-03 DIAGNOSIS — I4811 Longstanding persistent atrial fibrillation: Secondary | ICD-10-CM | POA: Diagnosis not present

## 2020-07-03 DIAGNOSIS — I130 Hypertensive heart and chronic kidney disease with heart failure and stage 1 through stage 4 chronic kidney disease, or unspecified chronic kidney disease: Secondary | ICD-10-CM | POA: Diagnosis not present

## 2020-07-03 DIAGNOSIS — I5032 Chronic diastolic (congestive) heart failure: Secondary | ICD-10-CM | POA: Diagnosis not present

## 2020-07-03 DIAGNOSIS — Z7901 Long term (current) use of anticoagulants: Secondary | ICD-10-CM

## 2020-07-03 DIAGNOSIS — Z91010 Allergy to peanuts: Secondary | ICD-10-CM

## 2020-07-03 DIAGNOSIS — Z9012 Acquired absence of left breast and nipple: Secondary | ICD-10-CM

## 2020-07-03 DIAGNOSIS — G4733 Obstructive sleep apnea (adult) (pediatric): Secondary | ICD-10-CM | POA: Diagnosis not present

## 2020-07-03 DIAGNOSIS — I4891 Unspecified atrial fibrillation: Secondary | ICD-10-CM | POA: Diagnosis present

## 2020-07-03 LAB — CBC
HCT: 30 % — ABNORMAL LOW (ref 36.0–46.0)
Hemoglobin: 9.3 g/dL — ABNORMAL LOW (ref 12.0–15.0)
MCH: 26.6 pg (ref 26.0–34.0)
MCHC: 31 g/dL (ref 30.0–36.0)
MCV: 85.7 fL (ref 80.0–100.0)
Platelets: 359 10*3/uL (ref 150–400)
RBC: 3.5 MIL/uL — ABNORMAL LOW (ref 3.87–5.11)
RDW: 15 % (ref 11.5–15.5)
WBC: 7.2 10*3/uL (ref 4.0–10.5)
nRBC: 0 % (ref 0.0–0.2)

## 2020-07-03 LAB — APTT: aPTT: 121 seconds — ABNORMAL HIGH (ref 24–36)

## 2020-07-03 LAB — BASIC METABOLIC PANEL
Anion gap: 8 (ref 5–15)
BUN: 9 mg/dL (ref 8–23)
CO2: 24 mmol/L (ref 22–32)
Calcium: 8.8 mg/dL — ABNORMAL LOW (ref 8.9–10.3)
Chloride: 106 mmol/L (ref 98–111)
Creatinine, Ser: 0.84 mg/dL (ref 0.44–1.00)
GFR, Estimated: >60 mL/min (ref >60)
Glucose, Bld: 122 mg/dL — ABNORMAL HIGH (ref 70–99)
Potassium: 3.2 mmol/L — ABNORMAL LOW (ref 3.5–5.1)
Sodium: 138 mmol/L (ref 135–145)

## 2020-07-03 LAB — RESP PANEL BY RT-PCR (FLU A&B, COVID) ARPGX2
Influenza A by PCR: NEGATIVE
Influenza B by PCR: NEGATIVE
SARS Coronavirus 2 by RT PCR: NEGATIVE

## 2020-07-03 LAB — HEPARIN LEVEL (UNFRACTIONATED): Heparin Unfractionated: >1.1 IU/mL — ABNORMAL HIGH (ref 0.30–0.70)

## 2020-07-03 LAB — TROPONIN I (HIGH SENSITIVITY)
Troponin I (High Sensitivity): 9 ng/L (ref <18)
Troponin I (High Sensitivity): 1029 ng/L (ref <18)
Troponin I (High Sensitivity): 2520 ng/L (ref <18)
Troponin I (High Sensitivity): 3872 ng/L (ref <18)

## 2020-07-03 LAB — MAGNESIUM: Magnesium: 1.7 mg/dL (ref 1.7–2.4)

## 2020-07-03 MED ORDER — ACETAMINOPHEN 325 MG PO TABS
650.0000 mg | ORAL_TABLET | ORAL | Status: DC | PRN
  Administered 2020-07-06 – 2020-07-08 (×3): 650 mg via ORAL
  Filled 2020-07-03 (×4): qty 2

## 2020-07-03 MED ORDER — BUDESONIDE 0.25 MG/2ML IN SUSP
0.2500 mg | Freq: Two times a day (BID) | RESPIRATORY_TRACT | Status: DC
  Administered 2020-07-03 – 2020-07-08 (×9): 0.25 mg via RESPIRATORY_TRACT
  Filled 2020-07-03 (×8): qty 2

## 2020-07-03 MED ORDER — GABAPENTIN 100 MG PO CAPS
100.0000 mg | ORAL_CAPSULE | Freq: Every day | ORAL | Status: DC
  Administered 2020-07-03: 100 mg via ORAL
  Administered 2020-07-04 – 2020-07-05 (×2): 200 mg via ORAL
  Administered 2020-07-06 – 2020-07-07 (×2): 100 mg via ORAL
  Filled 2020-07-03 (×2): qty 1
  Filled 2020-07-03 (×2): qty 2
  Filled 2020-07-03: qty 1

## 2020-07-03 MED ORDER — ONDANSETRON HCL 4 MG/2ML IJ SOLN: 4.0000 mg | Freq: Four times a day (QID) | INTRAMUSCULAR | Status: DC | PRN

## 2020-07-03 MED ORDER — POTASSIUM CHLORIDE CRYS ER 20 MEQ PO TBCR
40.0000 meq | EXTENDED_RELEASE_TABLET | Freq: Once | ORAL | Status: AC
  Administered 2020-07-03: 40 meq via ORAL
  Filled 2020-07-03: qty 2

## 2020-07-03 MED ORDER — ASPIRIN 81 MG PO CHEW
324.0000 mg | CHEWABLE_TABLET | Freq: Once | ORAL | Status: AC
  Administered 2020-07-03: 324 mg via ORAL
  Filled 2020-07-03: qty 4

## 2020-07-03 MED ORDER — NITROGLYCERIN 0.4 MG SL SUBL
0.4000 mg | SUBLINGUAL_TABLET | SUBLINGUAL | Status: AC | PRN
  Administered 2020-07-03 (×3): 0.4 mg via SUBLINGUAL
  Filled 2020-07-03 (×3): qty 1

## 2020-07-03 MED ORDER — ASPIRIN EC 81 MG PO TBEC
81.0000 mg | DELAYED_RELEASE_TABLET | Freq: Every day | ORAL | Status: DC
  Administered 2020-07-04 – 2020-07-09 (×4): 81 mg via ORAL
  Filled 2020-07-03 (×5): qty 1

## 2020-07-03 MED ORDER — IOHEXOL 350 MG/ML SOLN
75.0000 mL | Freq: Once | INTRAVENOUS | Status: AC | PRN
  Administered 2020-07-03: 75 mL via INTRAVENOUS

## 2020-07-03 MED ORDER — ENSURE ENLIVE PO LIQD
237.0000 mL | Freq: Two times a day (BID) | ORAL | Status: DC
  Administered 2020-07-03 – 2020-07-05 (×4): 237 mL via ORAL

## 2020-07-03 MED ORDER — HEPARIN (PORCINE) 25000 UT/250ML-% IV SOLN
600.0000 [IU]/h | INTRAVENOUS | Status: DC
  Administered 2020-07-03: 800 [IU]/h via INTRAVENOUS
  Administered 2020-07-04 – 2020-07-06 (×2): 600 [IU]/h via INTRAVENOUS
  Filled 2020-07-03 (×3): qty 250

## 2020-07-03 MED ORDER — AMLODIPINE BESYLATE 10 MG PO TABS
10.0000 mg | ORAL_TABLET | Freq: Every day | ORAL | Status: DC
  Administered 2020-07-03 – 2020-07-09 (×7): 10 mg via ORAL
  Filled 2020-07-03 (×7): qty 1

## 2020-07-03 MED ORDER — NITROGLYCERIN 0.4 MG SL SUBL: 0.4000 mg | SUBLINGUAL_TABLET | SUBLINGUAL | Status: DC | PRN

## 2020-07-03 MED ORDER — PANTOPRAZOLE SODIUM 40 MG PO TBEC
40.0000 mg | DELAYED_RELEASE_TABLET | Freq: Every day | ORAL | Status: DC
  Administered 2020-07-03 – 2020-07-09 (×7): 40 mg via ORAL
  Filled 2020-07-03 (×7): qty 1

## 2020-07-03 MED ORDER — FLUTICASONE PROPIONATE HFA 110 MCG/ACT IN AERO: 2.0000 | INHALATION_SPRAY | Freq: Two times a day (BID) | RESPIRATORY_TRACT | Status: DC

## 2020-07-03 MED ORDER — METOPROLOL SUCCINATE ER 100 MG PO TB24
100.0000 mg | ORAL_TABLET | Freq: Every day | ORAL | Status: DC
  Administered 2020-07-03: 100 mg via ORAL
  Filled 2020-07-03: qty 1

## 2020-07-03 MED ORDER — ATORVASTATIN CALCIUM 40 MG PO TABS
40.0000 mg | ORAL_TABLET | Freq: Every day | ORAL | Status: DC
  Administered 2020-07-03 – 2020-07-09 (×7): 40 mg via ORAL
  Filled 2020-07-03 (×7): qty 1

## 2020-07-03 NOTE — ED Notes (Signed)
Attempted to call report to [phone]

## 2020-07-03 NOTE — Progress Notes (Signed)
ANTICOAGULATION CONSULT NOTE - Initial Consult  Pharmacy Consult for Heparin Indication: chest pain/ACS - STEMI  Allergies  Allergen Reactions  . Chocolate     Cough   . Lisinopril Other (See Comments)  . Peanuts [Peanut Oil] Cough and Other (See Comments)    Patient Measurements: Height: 5\' 4"  (162.6 cm) Weight: 68 kg (150 lb) IBW/kg (Calculated) : 54.7 Heparin Dosing Weight: 68kg  Vital Signs: Temp: 98.6 F (37 C) (05/28 0957) Temp Source: Oral (05/28 0957) BP: 163/85 (05/28 1215) Pulse Rate: 99 (05/28 1215)  Labs: Recent Labs    07/03/20 0954  HGB 9.3*  HCT 30.0*  PLT 359  CREATININE 0.84  TROPONINIHS 9    Estimated Creatinine Clearance: 47.2 mL/min (by C-G formula based on SCr of 0.84 mg/dL).   Medical History: Past Medical History:  Diagnosis Date  . Arthritis    "all over"  . Atrial fibrillation ([place])    Diagnosed ~2009  . Breast cancer, left breast ([place]) 1998   S/P chemo and mastectomy   . GERD (gastroesophageal reflux disease)   . Hyperlipidemia   . Hypertension   . Left shoulder pain    "MRI showed pinched nerve" - per pt  . OSA on CPAP   . Pneumonia    "4-5 times" (01/06/2015)  . Type II diabetes mellitus ([facility])     Medications:  (Not in a hospital admission)  Scheduled:   Infusions:  . heparin 800 Units/hr (07/03/20 1218)   Assessment: 87 year old female with a history of diabetes, hypertension, hyperlipidemia, atrial fibrillation on Xarelto and chronic kidney disease found to have STEMI at [facility].  Patient's last dose of Xarelto 5/27pm. Cardiology recommending heparin initiation.  Goal of Therapy:  aPTT 66-102 Monitor platelets by anticoagulation protocol: Yes   Plan:  Given taking xarelto PTA and last dose < 18 hours PTA will forego initial heparin bolus. Cardiology recommending heparin despite time since last xarelto dose so will initiate drip. Start heparin infusion at 800 units/hr  F/u heparin plan for post-cath  Heloise Purpura 07/03/2020,12:39 PM

## 2020-07-03 NOTE — ED Triage Notes (Signed)
Pt arrives pov with driver, c/o mid-sternal CP with radiation to L arm and back starting 1 hr pta. Pt endorses nausea, shob.

## 2020-07-03 NOTE — ED Provider Notes (Signed)
[place] EMERGENCY DEPARTMENT Provider Note   CSN: [id number] Arrival date & time: 07/03/20  0940     History Chief Complaint  Patient presents with  . Chest Pain    [name] is a 87 y.o. female.  Patient is a 87 year old female with a history of diabetes, hypertension, hyperlipidemia, atrial fibrillation on Xarelto and chronic kidney disease who presents with chest pain.  She reports sudden onset of chest pain in the left chest that radiates down her left arm and into her back.  This started about an hour prior to arrival.  She has some shortness of breath and nausea.  No diaphoresis.  No history of similar symptoms in the past.  She took a baby aspirin with no improvement in symptoms.  She has some intermittent leg swelling which is not significantly changed from her baseline.  No other recent illnesses.  She was seen here in April for acute on chronic CHF exacerbation.        Past Medical History:  Diagnosis Date  . Arthritis    "all over"  . Atrial fibrillation ([place])    Diagnosed ~2009  . Breast cancer, left breast ([place]) 1998   S/P chemo and mastectomy   . GERD (gastroesophageal reflux disease)   . Hyperlipidemia   . Hypertension   . Left shoulder pain    "MRI showed pinched nerve" - per pt  . OSA on CPAP   . Pneumonia    "4-5 times" (01/06/2015)  . Type II diabetes mellitus [facility])     Patient Active Problem List   Diagnosis Date Noted  . Primary osteoarthritis, right ankle and foot 01/24/2018  . Arthritis of right ankle 08/21/2017  . Allergic urticaria 09/07/2016  . Generalized pruritus 09/07/2016  . Moderate persistent asthma 09/07/2016  . Perennial and seasonal allergic rhinitis 09/07/2016  . Chest pain 01/06/2015  . Angina at rest [facility]) 01/06/2015  . Diabetes ([place]) 01/06/2015  . A-fib ([place]) 01/06/2015  . HLD (hyperlipidemia) 01/06/2015  . Tobacco abuse 01/06/2015  . Diabetes mellitus with complication ([place])   . Painful total knee  replacement ([place]) 01/28/2013  . Atrial fibrillation ([place])   . Left shoulder pain   . Hypertension     Past Surgical History:  Procedure Laterality Date  . BREAST BIOPSY Left 1998  . CATARACT EXTRACTION W/ INTRAOCULAR LENS  IMPLANT, BILATERAL Bilateral   . JOINT REPLACEMENT    . LAPAROSCOPIC CHOLECYSTECTOMY    . MASTECTOMY Left 1998  . TOTAL KNEE ARTHROPLASTY Left 2001  . TOTAL KNEE REVISION Left 01/28/2013   Procedure: LEFT TOTAL KNEE ARTHROPLASTY REVISION;  Surgeon: Marianna Payment, MD;  Location: [facility];  Service: Orthopedics;  Laterality: Left;     OB History   No obstetric history on file.     Family History  Problem Relation Age of Onset  . Heart attack Mother        91s  . Emphysema Brother   . Allergic rhinitis Neg Hx   . Angioedema Neg Hx   . Asthma Neg Hx   . Eczema Neg Hx   . Immunodeficiency Neg Hx   . Urticaria Neg Hx     Social History   Tobacco Use  . Smoking status: Never Smoker  . Smokeless tobacco: Current User    Types: Snuff  Vaping Use  . Vaping Use: Never used  Substance Use Topics  . Alcohol use: Not Currently    Comment: 01/06/2015 "I'll have a beer q  once in awhile"  . Drug use: No    Home Medications Prior to Admission medications   Medication Sig Start Date End Date Taking? Authorizing Provider  albuterol (PROVENTIL HFA;VENTOLIN HFA) 108 (90 Base) MCG/ACT inhaler Inhale 1-2 puffs into the lungs every 4 (four) hours as needed for wheezing or shortness of breath. 09/07/16   Bobbitt, [place] Muta, MD  amLODipine (NORVASC) 10 MG tablet Take 10 mg by mouth. 08/10/16   [provider]  Cholecalciferol (VITAMIN D) 2000 units tablet Frequency:daily   Dosage:2000   UNIT  Instructions:D3 Dots (2000UNIT TBDP, Oral daily)  Note:    [provider]  FLOVENT HFA 110 MCG/ACT inhaler TAKE 2 PUFFS BY MOUTH TWICE A DAY 03/21/17   Bobbitt, [place] Muta, MD  fluticasone [facility]) 50 MCG/ACT nasal spray Use 1 spray per nostril 1-2 times  daily as needed 09/07/16   Bobbitt, [place]alia Muta, MD  furosemide (LASIX) 20 MG tablet Take 1 tablet (20 mg total) by mouth daily. 05/08/20 06/07/20  Corena Herter, PA-C  levocetirizine (XYZAL) 5 MG tablet TAKE 1 TABLET BY MOUTH EVERY DAY IN THE EVENING 03/14/17   Bobbitt, [place] Muta, MD  lisinopril (PRINIVIL,ZESTRIL) 10 MG tablet Take 10 mg by mouth daily.    [provider]  montelukast (SINGULAIR) 10 MG tablet TAKE 1 TABLET BY MOUTH EVERYDAY AT BEDTIME 03/14/17   Bobbitt, Sedalia Muta, MD  omeprazole (PRILOSEC) 20 MG capsule Take 1 capsule (20 mg total) by mouth daily. 06/22/18   Quintella Reichert, MD  predniSONE (STERAPRED UNI-PAK 21 TAB) 10 MG (21) TBPK tablet Take as directed 02/26/19   Leandrew Koyanagi, MD  rivaroxaban (XARELTO) 20 MG TABS tablet Take 20 mg by mouth every morning.     [provider]    Allergies    Chocolate, Lisinopril, and Peanuts [peanut oil]  Review of Systems   Review of Systems  Constitutional: Negative for chills, diaphoresis, fatigue and fever.  HENT: Negative for congestion, rhinorrhea and sneezing.   Eyes: Negative.   Respiratory: Positive for chest tightness and shortness of breath. Negative for cough.   Cardiovascular: Positive for chest pain. Negative for leg swelling.  Gastrointestinal: Positive for nausea. Negative for abdominal pain, blood in stool, diarrhea and vomiting.  Genitourinary: Negative for difficulty urinating, flank pain, frequency and hematuria.  Musculoskeletal: Negative for arthralgias and back pain.  Skin: Negative for rash.  Neurological: Negative for dizziness, speech difficulty, weakness, numbness and headaches.    Physical Exam Updated Vital Signs BP (!) 149/94   Pulse 85   Temp 98.6 F (37 C) (Oral)   Resp (!) 21   Ht 5\' 4"  (1.626 m)   Wt 68 kg   SpO2 100%   BMI 25.75 kg/m   Physical Exam Constitutional:      Appearance: She is well-developed.  HENT:     Head: Normocephalic and atraumatic.  Eyes:      Pupils: Pupils are equal, round, and reactive to light.  Cardiovascular:     Rate and Rhythm: Normal rate and regular rhythm.     Heart sounds: Normal heart sounds.  Pulmonary:     Effort: Pulmonary effort is normal. No respiratory distress.     Breath sounds: Normal breath sounds. No wheezing or rales.  Chest:     Chest wall: No tenderness.  Abdominal:     General: Bowel sounds are normal.     Palpations: Abdomen is soft.     Tenderness: There is no abdominal tenderness. There is  no guarding or rebound.  Musculoskeletal:        General: Normal range of motion.     Cervical back: Normal range of motion and neck supple.     Comments: 2+ edema to lower extremities bilaterally  Lymphadenopathy:     Cervical: No cervical adenopathy.  Skin:    General: Skin is warm and dry.     Findings: No rash.  Neurological:     Mental Status: She is alert and oriented to person, place, and time.     ED Results / Procedures / Treatments   Labs (all labs ordered are listed, but only abnormal results are displayed) Labs Reviewed  BASIC METABOLIC PANEL - Abnormal; Notable for the following components:      Result Value   Potassium 3.2 (*)    Glucose, Bld 122 (*)    Calcium 8.8 (*)    All other components within normal limits  CBC - Abnormal; Notable for the following components:   RBC 3.50 (*)    Hemoglobin 9.3 (*)    HCT 30.0 (*)    All other components within normal limits  TROPONIN I (HIGH SENSITIVITY)  TROPONIN I (HIGH SENSITIVITY)    EKG EKG Interpretation  Date/Time:  Saturday Jul 03 2020 10:52:36 EDT Ventricular Rate:  93 PR Interval:    QRS Duration: 85 QT Interval:  394 QTC Calculation: 491 R Axis:   93 Text Interpretation: Atrial fibrillation Anteroseptal infarct, age indeterminate ST elevation, consider inferior injury Confirmed by Malvin Johns [phone]) on 07/03/2020 11:02:07 AM   Radiology DG Chest Port 1 View  Result Date: 07/03/2020 CLINICAL DATA:  Mid sternal  chest pain radiating to the left arm beginning 1 hour ago. EXAM: PORTABLE CHEST 1 VIEW COMPARISON:  05/08/2020 and older studies. FINDINGS: Stable enlargement of the cardiopericardial silhouette. No mediastinal or hilar masses. Mild prominence of the interstitial lung markings and lung hyperexpansion stable from the prior exam. No evidence of pneumonia or pulmonary edema. No convincing pleural effusion or pneumothorax. Stable changes from prior left breast surgery. Skeletal structures are demineralized but grossly intact. IMPRESSION: 1. No acute cardiopulmonary disease. 2. Stable cardiomegaly. Electronically Signed   By: Lajean Manes M.D.   On: 07/03/2020 10:17   CT Angio Chest/Abd/Pel for Dissection W and/or W/WO  Result Date: 07/03/2020 CLINICAL DATA:  Mid sternal chest pain radiating to left arm beginning 1 hour prior to arrival. Some nausea and shortness of breath. EXAM: CT ANGIOGRAPHY CHEST, ABDOMEN AND PELVIS TECHNIQUE: Multidetector CT imaging through the chest, abdomen and pelvis was performed using the standard protocol during bolus administration of intravenous contrast. Multiplanar reconstructed images and MIPs were obtained and reviewed to evaluate the vascular anatomy. CONTRAST:  55mL OMNIPAQUE IOHEXOL 350 MG/ML SOLN COMPARISON:  03/08/2018. FINDINGS: CTA CHEST FINDINGS Cardiovascular: Mild enlargement of the heart, with a right heart enlargement predominance, mostly the right atrium. This is similar to the prior CT. No pericardial effusion. Left and right coronary artery calcifications/atherosclerosis. Great vessels are normal in caliber. No aortic dissection. No significant atherosclerosis. Aortic arch branch vessels are widely patent. Mediastinum/Nodes: No neck base, mediastinal or hilar masses. Several prominent shotty lymph nodes, pre-vascular and AP window, all subcentimeter, presumed reactive. Trachea and esophagus are unremarkable. Lungs/Pleura: Bilateral interstitial thickening. Mosaic  pattern of attenuation. No lung consolidation. No mass or suspicious nodule. No pleural effusion or pneumothorax. Musculoskeletal: No fracture or acute finding.  No bone lesion. Review of the MIP images confirms the above findings. CTA ABDOMEN AND PELVIS FINDINGS  VASCULAR Aorta: Normal in caliber.  Mild atherosclerosis.  No dissection. Celiac: Mild atherosclerotic plaque at the origin. No significant stenosis. No dissection. SMA: Patent without evidence of aneurysm, dissection, vasculitis or significant stenosis. Renals: Plaque at the origin of the left renal artery with narrowing estimated at 60%. Plaque at the origin of the right renal artery with no significant narrowing. IMA: At least mild narrowing at the origin due to atherosclerotic plaque. Vessel is patent. Inflow: Mild atherosclerotic plaque.  No narrowing or dissection. Veins: No obvious venous abnormality within the limitations of this arterial phase study. Review of the MIP images confirms the above findings. NON-VASCULAR Hepatobiliary: No focal liver abnormality is seen. Status post cholecystectomy. No biliary dilatation. Pancreas: Unremarkable. No pancreatic ductal dilatation or surrounding inflammatory changes. Spleen: Normal in size without focal abnormality. Adrenals/Urinary Tract: No adrenal masses. Bilateral renal cysts. No stones. No hydronephrosis. Normal ureters. Normal bladder. Stomach/Bowel: Small hiatal hernia. Stomach otherwise unremarkable. Small bowel and colon are normal in caliber. No wall thickening. No inflammation. Numerous colonic diverticula mostly on the left. No diverticulitis. Normal appendix visualized. Lymphatic: No enlarged lymph nodes. Reproductive: Uterus and bilateral adnexa are unremarkable. Other: No abdominal wall hernia or abnormality. No abdominopelvic ascites. Musculoskeletal: No fracture or acute finding.  No bone lesion. Review of the MIP images confirms the above findings. IMPRESSION: CTA FINDINGS 1. No aortic  dissection. No aneurysm. Mild atherosclerosis. No significant stenosis. 2. Atherosclerotic disease at the origin aortic branch vessels. Approximately 60% narrowing at the origin of the left renal artery and at least mild narrowing at the origin of the inferior mesenteric artery. No other evidence of a hemodynamically significant stenosis. NON CTA FINDINGS 1. No acute findings within the chest, abdomen or pelvis. 2. Cardiomegaly and left and right coronary artery atherosclerotic calcifications. 3. Lungs show chronic interstitial thickening. Mosaic attenuation is noted suggesting small airways disease. 4. Numerous colonic diverticula without evidence of diverticulitis. Electronically Signed   By: Lajean Manes M.D.   On: 07/03/2020 12:02    Procedures Procedures   Medications Ordered in ED Medications  heparin ADULT infusion 100 units/mL (25000 units/257mL) (has no administration in time range)  aspirin chewable tablet 324 mg (324 mg Oral Given 07/03/20 1011)  nitroGLYCERIN (NITROSTAT) SL tablet 0.4 mg (0.4 mg Sublingual Given 07/03/20 1025)  iohexol (OMNIPAQUE) 350 MG/ML injection 75 mL (75 mLs Intravenous Contrast Given 07/03/20 1126)    ED Course  I have reviewed the triage vital signs and the nursing notes.  Pertinent labs & imaging results that were available during my care of the patient were reviewed by me and considered in my medical decision making (see chart for details).    MDM Rules/Calculators/A&P                          11:05 discussed EKG with Dr. Burt Knack, awaiting CTA 11:55 CT neg for dissection.  Notified Dr. Burt Knack.  Does not definitively meet STEMI criteria but given her changes and symptoms, will activate the Cath Lab/transfer to [facility]  Patient is a 87 year old female who presents with chest pain going to her back and arm.  She looks uncomfortable on arrival.  Her EKG shows some ST changes inferiorly with some slight ST depressions in 1 and aVL.  It does not meet STEMI criteria  but does look different than her old EKG.  Her first troponin is negative.  Her other labs are nonconcerning.  Chest x-ray shows no obvious wide mediastinum.  This was reviewed by me.  Given her radiation to her back, CTA was ordered to rule out dissection.  I discussed the EKG with Dr. Burt Knack who agrees that the EKG looks concerning.  Will await CTA.  Once CTA reported is negative for dissection/aneurysm, Dr. Burt Knack was notified and advised to activate the Cath Lab.  [facility] notified.  She was started on heparin.  Pharmacy advised against bolus given her Xarelto last night.  She was given a full-strength aspirin.  She initially had been given some nitroglycerin which did not initially improve her pain.  However on reexam just prior to transfer, her pain had improved and she was only having some pain in her left shoulder at this point.  CRITICAL CARE Performed by: Malvin Johns Total critical care time: 90 minutes Critical care time was exclusive of separately billable procedures and treating other patients. Critical care was necessary to treat or prevent imminent or life-threatening deterioration. Critical care was time spent personally by me on the following activities: development of treatment plan with patient and/or surrogate as well as nursing, discussions with consultants, evaluation of patient's response to treatment, examination of patient, obtaining history from patient or surrogate, ordering and performing treatments and interventions, ordering and review of laboratory studies, ordering and review of radiographic studies, pulse oximetry and re-evaluation of patient's condition.  Final Clinical Impression(s) / ED Diagnoses Final diagnoses:  Unstable angina [facility])    Rx / DC Orders ED Discharge Orders    None       Malvin Johns, MD 07/03/20 1213

## 2020-07-03 NOTE — Progress Notes (Signed)
[place] for Heparin Indication: chest pain/ACS - STEMI  Allergies  Allergen Reactions  . Chocolate Other (See Comments)    Cough, sweating   . Lisinopril Other (See Comments)    Cough, sweating  . Peanuts [Peanut Oil] Cough    Patient Measurements: Height: 5\' 4"  (162.6 cm) Weight: 68 kg (150 lb) IBW/kg (Calculated) : 54.7 Heparin Dosing Weight: 68kg  Vital Signs: Temp: 97.9 F (36.6 C) (05/28 1441) Temp Source: Oral (05/28 1441) BP: 127/80 (05/28 1623) Pulse Rate: 87 (05/28 1623)  Labs: Recent Labs    07/03/20 0954 07/03/20 1159 07/03/20 1449 07/03/20 1659 07/03/20 2006  HGB 9.3*  --   --   --   --   HCT 30.0*  --   --   --   --   PLT 359  --   --   --   --   APTT  --   --   --   --  121*  HEPARINUNFRC  --   --   --   --  >1.10*  CREATININE 0.84  --   --   --   --   TROPONINIHS 9   < > 1,029* 2,520* 3,872*   < > = values in this interval not displayed.    Estimated Creatinine Clearance: 47.2 mL/min (by C-G formula based on SCr of 0.84 mg/dL).   Medical History: Past Medical History:  Diagnosis Date  . Arthritis    "all over"  . Atrial fibrillation ([place])    Diagnosed ~2009  . Breast cancer, left breast ([place]) 1998   S/P chemo and mastectomy   . GERD (gastroesophageal reflux disease)   . Hyperlipidemia   . Hypertension   . Left shoulder pain    "MRI showed pinched nerve" - per pt  . OSA on CPAP   . Pneumonia    "4-5 times" (01/06/2015)  . Type II diabetes mellitus ([facility])     Medications:  Medications Prior to Admission  Medication Sig Dispense Refill Last Dose  . amLODipine (NORVASC) 10 MG tablet Take 10 mg by mouth.   07/02/2020 at Unknown time  . Cholecalciferol (VITAMIN D) 2000 units tablet Take 2,000 Units by mouth daily.   07/02/2020 at Unknown time  . FLOVENT HFA 110 MCG/ACT inhaler TAKE 2 PUFFS BY MOUTH TWICE A DAY (Patient taking differently: Inhale 2 puffs into the lungs in the morning and at bedtime.)  12 g 0 07/02/2020 at Unknown time  . gabapentin (NEURONTIN) 100 MG capsule Take 100-200 mg by mouth at bedtime.   07/02/2020 at Unknown time  . metoprolol succinate (TOPROL-XL) 100 MG 24 hr tablet Take 100 mg by mouth daily.   07/02/2020 at 1900  . omeprazole (PRILOSEC) 20 MG capsule Take 1 capsule (20 mg total) by mouth daily. 14 capsule 0 07/02/2020 at Unknown time  . rivaroxaban (XARELTO) 20 MG TABS tablet Take 20 mg by mouth every morning.    07/02/2020 at 1900  . simvastatin (ZOCOR) 10 MG tablet Take 10 mg by mouth at bedtime.   07/02/2020 at Unknown time   Scheduled:  . amLODipine  10 mg Oral Daily  . [START ON 07/04/2020] aspirin EC  81 mg Oral Daily  . atorvastatin  40 mg Oral Daily  . budesonide  0.25 mg Nebulization BID  . feeding supplement  237 mL Oral BID BM  . gabapentin  100-200 mg Oral QHS  . metoprolol succinate  100 mg Oral Daily  .  pantoprazole  40 mg Oral Daily   Infusions:  . heparin 800 Units/hr (07/03/20 1218)   Assessment: 87 year old female with a history of diabetes, hypertension, hyperlipidemia, atrial fibrillation on Xarelto and chronic kidney disease found to have STEMI at [facility].  Patient's last dose of Xarelto 5/27pm. Cardiology recommending heparin initiation. Heparin level came back undetectably high (>1.1), aPTT 121, on 800 units/hr. Hgb 9.3, plt 359. Trop 9417>4081. No s/sx of bleeding or infusion issues.   Goal of Therapy:  aPTT 66-102 Monitor platelets by anticoagulation protocol: Yes   Plan:  Reduce heparin infusion to 650 units/hr  Order aPTT/HL in 8 hours  F/u heparin plan for post-cath  Antonietta Jewel, PharmD, [place] Pharmacist  Phone: [phone] 07/03/2020 10:10 PM  Please check AMION for all [place] phone numbers After 10:00 PM, call [place] [phone]

## 2020-07-03 NOTE — ED Provider Notes (Signed)
Patient is a transfer from bed [name] for evaluation of chest pain by cardiology.  They were concerned about possible cardiac cause of discomfort and due to worsening appearance of EKG, patient was emergently transferred by [facility] for cardiology to see.  Dr. Burt Knack with the STEMI team saw her and will admit to cardiology but based on her improvement in symptoms and improvement in EKG on arrival, will hold on emergent Cath Lab at this time.  Patient resting comfortably.  Cardiology will admit.   [name], Gwenyth Allegra, MD 07/03/20 [phone]

## 2020-07-03 NOTE — ED Notes (Signed)
Report given to Leanor Rubenstein, RN of [phone]

## 2020-07-03 NOTE — H&P (Addendum)
Cardiology Admission History and Physical:   Patient ID: [name] MRN: [id number]; DOB: [birth year]   Admission date: 07/03/2020  PCP:  Elisabeth Cara, PA-C   [facility] Providers Cardiologist:  None        Chief Complaint:  Chest pain  Patient Profile:   [name] is a 87 y.o. female with HTN, longstanding persistent atrial fibrillation, and CKD 3, who is being seen 07/03/2020 for the evaluation of chest pain.  History of Present Illness:   [name] is an 87 year old woman with medical problems outlined above, who presented to [facility] with chest pain this morning.  The patient describes a pain that started after she ate some food.  She described pain in the left upper chest into the back and down the left arm.  She describes this as an ache.  There was no associated shortness of breath but she did have nausea.  No diaphoresis.  She states that she would have felt better if she could have thrown up.  Her EKG showed some subtle changes of possible inferior injury but did not meet STEMI criteria.  I discussed her case with Dr. Tamera Punt and we agreed that the patient should have a CT scan of the chest as she complained of pain radiating into the back and has a long history of hypertension.  This CTA of the chest demonstrated no evidence of acute aortic syndrome or dissection.  She is transferred to [facility] with concern that she may need to go emergently for cardiac catheterization if pain persists.  She did receive fentanyl 50 mcg prior to arrival here.  In route and on arrival to [facility], her chest pain has resolved.  She is feeling much better.  She has no complaints at present.   Past Medical History:  Diagnosis Date   Arthritis    "all over"   Atrial fibrillation [facility])    Diagnosed ~2009   Breast cancer, left breast ([place]) 1998   S/P chemo and mastectomy    GERD (gastroesophageal reflux disease)    Hyperlipidemia    Hypertension     Left shoulder pain    "MRI showed pinched nerve" - per pt   OSA on CPAP    Pneumonia    "4-5 times" (01/06/2015)   Type II diabetes mellitus ([place])     Past Surgical History:  Procedure Laterality Date   BREAST BIOPSY Left 1998   CATARACT EXTRACTION W/ INTRAOCULAR LENS  IMPLANT, BILATERAL Bilateral    JOINT REPLACEMENT     LAPAROSCOPIC CHOLECYSTECTOMY     MASTECTOMY Left 1998   TOTAL KNEE ARTHROPLASTY Left 2001   TOTAL KNEE REVISION Left 01/28/2013   Procedure: LEFT TOTAL KNEE ARTHROPLASTY REVISION;  Surgeon: Marianna Payment, MD;  Location: [place];  Service: Orthopedics;  Laterality: Left;     Medications Prior to Admission: Prior to Admission medications   Medication Sig Start Date End Date Taking? Authorizing Provider  amLODipine (NORVASC) 10 MG tablet Take 10 mg by mouth. 08/10/16  Yes [provider]  Cholecalciferol (VITAMIN D) 2000 units tablet Frequency:daily   Dosage:2000   UNIT  Instructions:D3 Dots (2000UNIT TBDP, Oral daily)  Note:   Yes [provider]  FLOVENT HFA 110 MCG/ACT inhaler TAKE 2 PUFFS BY MOUTH TWICE A DAY 03/21/17  Yes Bobbitt, Sedalia Muta, MD  lisinopril (PRINIVIL,ZESTRIL) 10 MG tablet Take 10 mg by mouth daily.   Yes [provider]  omeprazole (PRILOSEC) 20 MG  capsule Take 1 capsule (20 mg total) by mouth daily. 06/22/18  Yes Quintella Reichert, MD  rivaroxaban (XARELTO) 20 MG TABS tablet Take 20 mg by mouth every morning.    Yes [provider]  albuterol (PROVENTIL HFA;VENTOLIN HFA) 108 (90 Base) MCG/ACT inhaler Inhale 1-2 puffs into the lungs every 4 (four) hours as needed for wheezing or shortness of breath. 09/07/16   Bobbitt, Sedalia Muta, MD  fluticasone Asencion Islam) 50 MCG/ACT nasal spray Use 1 spray per nostril 1-2 times daily as needed 09/07/16   Bobbitt, Sedalia Muta, MD  furosemide (LASIX) 20 MG tablet Take 1 tablet (20 mg total) by mouth daily. 05/08/20 06/07/20  Corena Herter, PA-C  gabapentin (NEURONTIN) 100 MG capsule  Take 100-200 mg by mouth at bedtime. 06/22/20   [provider]  levocetirizine (XYZAL) 5 MG tablet TAKE 1 TABLET BY MOUTH EVERY DAY IN THE EVENING 03/14/17   Bobbitt, Sedalia Muta, MD  metoprolol succinate (TOPROL-XL) 100 MG 24 hr tablet Take 100 mg by mouth daily. 05/18/20   [provider]  montelukast (SINGULAIR) 10 MG tablet TAKE 1 TABLET BY MOUTH EVERYDAY AT BEDTIME 03/14/17   Bobbitt, Sedalia Muta, MD  predniSONE (STERAPRED UNI-PAK 21 TAB) 10 MG (21) TBPK tablet Take as directed 02/26/19   Leandrew Koyanagi, MD  simvastatin (ZOCOR) 10 MG tablet Take 10 mg by mouth at bedtime. 06/07/20   [provider]     Allergies:    Allergies  Allergen Reactions   Chocolate     Cough    Lisinopril Other (See Comments)   Peanuts [Peanut Oil] Cough and Other (See Comments)    Social History:   Social History   Socioeconomic History   Marital status: Widowed    Spouse name: Not on file   Number of children: Not on file   Years of education: Not on file   Highest education level: Not on file  Occupational History   Not on file  Tobacco Use   Smoking status: Never Smoker   Smokeless tobacco: Current User    Types: Snuff  Vaping Use   Vaping Use: Never used  Substance and Sexual Activity   Alcohol use: Not Currently    Comment: 01/06/2015 "I'll have a beer q once in awhile"   Drug use: No   Sexual activity: Never  Other Topics Concern   Not on file  Social History Narrative   Moved from the [place] area to [place] ~ 78yrs ago to live with her daughter   Social Determinants of Health   Financial Resource Strain: Not on file  Food Insecurity: Not on file  Transportation Needs: Not on file  Physical Activity: Not on file  Stress: Not on file  Social Connections: Not on file  Intimate Partner Violence: Not on file    Family History:   The patient's family history includes Emphysema in her brother; Heart attack in her mother. There is no history of Allergic  rhinitis, Angioedema, Asthma, Eczema, Immunodeficiency, or Urticaria.    ROS:  Please see the history of present illness.  Positive for diffuse arthritic pain.  Complains of pain in her hips, knees, and ankles.  All other ROS reviewed and negative.     Physical Exam/Data:   Vitals:   07/03/20 1145 07/03/20 1215 07/03/20 1300 07/03/20 1315  BP: (!) 149/94 (!) 163/85 133/76 117/73  Pulse: 85 99 77 79  Resp: (!) 21 20 15 15   Temp:   (!) 97.5 F (36.4  C)   TempSrc:   Oral   SpO2: 100% 99% 100% 100%  Weight:      Height:       No intake or output data in the 24 hours ending 07/03/20 1328 Last 3 Weights 07/03/2020 05/08/2020 03/07/2020  Weight (lbs) 150 lb 149 lb 150 lb  Weight (kg) 68.04 kg 67.586 kg 68.04 kg     Body mass index is 25.75 kg/m.  General: Pleasant elderly woman in no distress HEENT: normal Lymph: no adenopathy Neck: no JVD Endocrine:  No thryomegaly Vascular: No carotid bruits; FA pulses 2+ bilaterally   Cardiac:  normal S1, S2; irregularly irregular; no murmur  Lungs:  clear to auscultation bilaterally, no wheezing, rhonchi or rales  Abd: soft, nontender, no hepatomegaly  Ext: 1+ bilateral ankle edema, no pretibial edema Musculoskeletal:  No deformities, BUE and BLE strength normal and equal Skin: warm and dry  Neuro:  CNs 2-12 intact, no focal abnormalities noted Psych:  Normal affect    EKG:  The ECG that was done on arrival to the emergency room at [facility] was personally reviewed and demonstrates atrial fibrillation, rightward axis, nonspecific ST abnormality.  Laboratory Data:  High Sensitivity Troponin:   Recent Labs  Lab 07/03/20 0954 07/03/20 1159  TROPONINIHS 9 64*      Chemistry Recent Labs  Lab 07/03/20 0954  NA 138  K 3.2*  CL 106  CO2 24  GLUCOSE 122*  BUN 9  CREATININE 0.84  CALCIUM 8.8*  GFRNONAA >60  ANIONGAP 8    No results for input(s): PROT, ALBUMIN, AST, ALT, ALKPHOS, BILITOT in the last 168 hours. Hematology Recent  Labs  Lab 07/03/20 0954  WBC 7.2  RBC 3.50*  HGB 9.3*  HCT 30.0*  MCV 85.7  MCH 26.6  MCHC 31.0  RDW 15.0  PLT 359   BNPNo results for input(s): BNP, PROBNP in the last 168 hours.  DDimer No results for input(s): DDIMER in the last 168 hours.   Radiology/Studies:  DG Chest Port 1 View  Result Date: 07/03/2020 CLINICAL DATA:  Mid sternal chest pain radiating to the left arm beginning 1 hour ago. EXAM: PORTABLE CHEST 1 VIEW COMPARISON:  05/08/2020 and older studies. FINDINGS: Stable enlargement of the cardiopericardial silhouette. No mediastinal or hilar masses. Mild prominence of the interstitial lung markings and lung hyperexpansion stable from the prior exam. No evidence of pneumonia or pulmonary edema. No convincing pleural effusion or pneumothorax. Stable changes from prior left breast surgery. Skeletal structures are demineralized but grossly intact. IMPRESSION: 1. No acute cardiopulmonary disease. 2. Stable cardiomegaly. Electronically Signed   By: Lajean Manes M.D.   On: 07/03/2020 10:17   CT Angio Chest/Abd/Pel for Dissection W and/or W/WO  Result Date: 07/03/2020 CLINICAL DATA:  Mid sternal chest pain radiating to left arm beginning 1 hour prior to arrival. Some nausea and shortness of breath. EXAM: CT ANGIOGRAPHY CHEST, ABDOMEN AND PELVIS TECHNIQUE: Multidetector CT imaging through the chest, abdomen and pelvis was performed using the standard protocol during bolus administration of intravenous contrast. Multiplanar reconstructed images and MIPs were obtained and reviewed to evaluate the vascular anatomy. CONTRAST:  76mL OMNIPAQUE IOHEXOL 350 MG/ML SOLN COMPARISON:  03/08/2018. FINDINGS: CTA CHEST FINDINGS Cardiovascular: Mild enlargement of the heart, with a right heart enlargement predominance, mostly the right atrium. This is similar to the prior CT. No pericardial effusion. Left and right coronary artery calcifications/atherosclerosis. Great vessels are normal in caliber. No  aortic dissection. No significant atherosclerosis. Aortic arch  branch vessels are widely patent. Mediastinum/Nodes: No neck base, mediastinal or hilar masses. Several prominent shotty lymph nodes, pre-vascular and AP window, all subcentimeter, presumed reactive. Trachea and esophagus are unremarkable. Lungs/Pleura: Bilateral interstitial thickening. Mosaic pattern of attenuation. No lung consolidation. No mass or suspicious nodule. No pleural effusion or pneumothorax. Musculoskeletal: No fracture or acute finding.  No bone lesion. Review of the MIP images confirms the above findings. CTA ABDOMEN AND PELVIS FINDINGS VASCULAR Aorta: Normal in caliber.  Mild atherosclerosis.  No dissection. Celiac: Mild atherosclerotic plaque at the origin. No significant stenosis. No dissection. SMA: Patent without evidence of aneurysm, dissection, vasculitis or significant stenosis. Renals: Plaque at the origin of the left renal artery with narrowing estimated at 60%. Plaque at the origin of the right renal artery with no significant narrowing. IMA: At least mild narrowing at the origin due to atherosclerotic plaque. Vessel is patent. Inflow: Mild atherosclerotic plaque.  No narrowing or dissection. Veins: No obvious venous abnormality within the limitations of this arterial phase study. Review of the MIP images confirms the above findings. NON-VASCULAR Hepatobiliary: No focal liver abnormality is seen. Status post cholecystectomy. No biliary dilatation. Pancreas: Unremarkable. No pancreatic ductal dilatation or surrounding inflammatory changes. Spleen: Normal in size without focal abnormality. Adrenals/Urinary Tract: No adrenal masses. Bilateral renal cysts. No stones. No hydronephrosis. Normal ureters. Normal bladder. Stomach/Bowel: Small hiatal hernia. Stomach otherwise unremarkable. Small bowel and colon are normal in caliber. No wall thickening. No inflammation. Numerous colonic diverticula mostly on the left. No diverticulitis.  Normal appendix visualized. Lymphatic: No enlarged lymph nodes. Reproductive: Uterus and bilateral adnexa are unremarkable. Other: No abdominal wall hernia or abnormality. No abdominopelvic ascites. Musculoskeletal: No fracture or acute finding.  No bone lesion. Review of the MIP images confirms the above findings. IMPRESSION: CTA FINDINGS 1. No aortic dissection. No aneurysm. Mild atherosclerosis. No significant stenosis. 2. Atherosclerotic disease at the origin aortic branch vessels. Approximately 60% narrowing at the origin of the left renal artery and at least mild narrowing at the origin of the inferior mesenteric artery. No other evidence of a hemodynamically significant stenosis. NON CTA FINDINGS 1. No acute findings within the chest, abdomen or pelvis. 2. Cardiomegaly and left and right coronary artery atherosclerotic calcifications. 3. Lungs show chronic interstitial thickening. Mosaic attenuation is noted suggesting small airways disease. 4. Numerous colonic diverticula without evidence of diverticulitis. Electronically Signed   By: Lajean Manes M.D.   On: 07/03/2020 12:02     Assessment and Plan:   Unstable angina: The patient is now symptom-free.  However, she does have some subtle but dynamic EKG changes where she had more upright inferior T waves with suggestive of injury current, albeit submillimeter.  This is now resolved and the patient is pain-free.  She has a minimal troponin increase with an initial troponin of 9 but a second troponin of 64.  The patient took her last dose of rivaroxaban last night.  She will be treated with unfractionated heparin.  I think cardiac catheterization is indicated for definitive evaluation based on her symptoms, EKG changes, and mildly elevated troponin.  We will repeat cardiac enzymes.  Will give her time for Xarelto washout and anticipate cardiac catheterization early next week as long as she does not have recurrent symptoms.  If she has recurrent chest  discomfort or significant EKG changes, will proceed emergently with cardiac catheterization. I have reviewed the risks, indications, and alternatives to cardiac catheterization, possible angioplasty, and stenting with the patient. Risks include but are not  limited to bleeding, infection, vascular injury, stroke, myocardial infection, arrhythmia, kidney injury, radiation-related injury in the case of prolonged fluoroscopy use, emergency cardiac surgery, and death. The patient understands the risks of serious complication is 1-2 in 1610 with diagnostic cardiac cath and 1-2% or less with angioplasty/stenting.  Longstanding persistent atrial fibrillation: Hold rivaroxaban, treat with IV heparin, continue metoprolol succinate as her heart rate appears well controlled.  Check 2D echocardiogram. Hypertension with stage IIIa chronic kidney disease: Hold lisinopril as she just received iodinated contrast for CT angiogram today and will probably have another contrast procedure with cardiac catheterization.  Otherwise continue current medical therapy and monitor blood pressure. Lipids: Patient has been on simvastatin 10 mg.  Likely transition to a high intensity statin.   Risk Assessment/Risk Scores:    TIMI Risk Score for Unstable Angina or Non-ST Elevation MI:   The patient's TIMI risk score is 3, which indicates a 13% risk of all cause mortality, new or recurrent myocardial infarction or need for urgent revascularization in the next 14 days.    CHA2DS2-VASc Score = 5  This indicates a 7.2% annual risk of stroke. The patient's score is based upon: CHF History: Yes HTN History: Yes Diabetes History: No Stroke History: No Vascular Disease History: No Age Score: 2 Gender Score: 1       Severity of Illness: The appropriate patient status for this patient is INPATIENT. Inpatient status is judged to be reasonable and necessary in order to provide the required intensity of service to ensure the patient's  safety. The patient's presenting symptoms, physical exam findings, and initial radiographic and laboratory data in the context of their chronic comorbidities is felt to place them at high risk for further clinical deterioration. Furthermore, it is not anticipated that the patient will be medically stable for discharge from the hospital within 2 midnights of admission. The following factors support the patient status of inpatient.   " The patient's presenting symptoms include chest pain. " The worrisome physical exam findings include none. " The initial radiographic and laboratory data are worrisome because of EKG changes/elevated troponin. " The chronic co-morbidities include HTN, CKD 3, atrial fibrillation.   * I certify that at the point of admission it is my clinical judgment that the patient will require inpatient hospital care spanning beyond 2 midnights from the point of admission due to high intensity of service, high risk for further deterioration and high frequency of surveillance required.*    For questions or updates, please contact [place] Please consult [email or web address] for contact info under     Signed, Sherren Mocha, MD  07/03/2020 1:28 PM   ADDENDUM: Pt with chronic diastolic heart failure, no evidence of acute decompensation. Echo shows severely enlarged RV/RA and severe TR.   Sherren Mocha 07/06/2020 2:45 PM

## 2020-07-03 NOTE — Progress Notes (Addendum)
Pre-cath orders written for Tuesday cardiac cath per discussion with Dr. Burt Knack. She is listed on add-on board for cath that day. Standard IV fluid orders written to start day of cath - if there is any clinical change with regard to CHF or worsening renal insufficiency, please reference orders under sign/held to adjust fluids.  Addendum: received notification from nurse next troponin was 1,029. This is consistent with suspected diagnosis of NSTEMI. Nurse confirms patient remains chest pain free and no SOB. Will continue plan as outlined before. Will check another troponin this evening to continue to trend to peak as well as tomorrow AM. Nurse aware to notify for any clinical changes or new symptoms.  Savio Albrecht PA-C

## 2020-07-03 NOTE — Progress Notes (Signed)
   07/03/20 1252  Clinical Encounter Type  Visited With Patient not available  Visit Type Other (Comment) (Code Stemi)  Referral From Nurse  Consult/Referral To Chaplain  Chaplain responded- Received page, that the patient will arrive in Cath lab in 36min. This chaplain called Cath. lab spoke with [place] who stated the patient was coming but had not arrived. I advised her to call if a chaplain is needed.  This note was prepared by Jeanine Luz, M.Div..  For questions please contact by phone [phone].

## 2020-07-03 NOTE — ED Triage Notes (Signed)
Pt presents with [facility] as transfer from [facility]. With c/c of possible STEMI. Pt had chest and HA in left shoulder.   324 ASA, Nitro x3. Heparin 800 u//h.Fentanyl 33mcg @ 1230. Cardiologist M. Burt Knack, MD at bedside.  144/93, A-Fib, 64HR, 100# 2L

## 2020-07-03 NOTE — Progress Notes (Signed)
RN notified at 1750 that patient had 7 beats VT at 1715.  Dr. Neena Rhymes notified.  Orders received for 40 po potassium x 1 now and to add magnesium level to previous lab draw.

## 2020-07-03 NOTE — ED Notes (Signed)
Attempted to call cath lab x 2 at [phone]; call not answered

## 2020-07-04 ENCOUNTER — Inpatient Hospital Stay (HOSPITAL_COMMUNITY): Payer: Medicare Other

## 2020-07-04 DIAGNOSIS — Z6825 Body mass index (BMI) 25.0-25.9, adult: Secondary | ICD-10-CM | POA: Diagnosis not present

## 2020-07-04 DIAGNOSIS — I5032 Chronic diastolic (congestive) heart failure: Secondary | ICD-10-CM | POA: Diagnosis present

## 2020-07-04 DIAGNOSIS — I340 Nonrheumatic mitral (valve) insufficiency: Secondary | ICD-10-CM | POA: Diagnosis not present

## 2020-07-04 DIAGNOSIS — I361 Nonrheumatic tricuspid (valve) insufficiency: Secondary | ICD-10-CM | POA: Diagnosis not present

## 2020-07-04 DIAGNOSIS — M19071 Primary osteoarthritis, right ankle and foot: Secondary | ICD-10-CM | POA: Diagnosis present

## 2020-07-04 DIAGNOSIS — I25111 Atherosclerotic heart disease of native coronary artery with angina pectoris with documented spasm: Secondary | ICD-10-CM | POA: Diagnosis present

## 2020-07-04 DIAGNOSIS — Z20822 Contact with and (suspected) exposure to covid-19: Secondary | ICD-10-CM | POA: Diagnosis present

## 2020-07-04 DIAGNOSIS — Z9221 Personal history of antineoplastic chemotherapy: Secondary | ICD-10-CM | POA: Diagnosis not present

## 2020-07-04 DIAGNOSIS — J4540 Moderate persistent asthma, uncomplicated: Secondary | ICD-10-CM | POA: Diagnosis present

## 2020-07-04 DIAGNOSIS — M7989 Other specified soft tissue disorders: Secondary | ICD-10-CM | POA: Diagnosis present

## 2020-07-04 DIAGNOSIS — Z720 Tobacco use: Secondary | ICD-10-CM | POA: Diagnosis not present

## 2020-07-04 DIAGNOSIS — I130 Hypertensive heart and chronic kidney disease with heart failure and stage 1 through stage 4 chronic kidney disease, or unspecified chronic kidney disease: Secondary | ICD-10-CM | POA: Diagnosis present

## 2020-07-04 DIAGNOSIS — I214 Non-ST elevation (NSTEMI) myocardial infarction: Secondary | ICD-10-CM

## 2020-07-04 DIAGNOSIS — G4733 Obstructive sleep apnea (adult) (pediatric): Secondary | ICD-10-CM | POA: Diagnosis present

## 2020-07-04 DIAGNOSIS — Z8701 Personal history of pneumonia (recurrent): Secondary | ICD-10-CM | POA: Diagnosis not present

## 2020-07-04 DIAGNOSIS — I200 Unstable angina: Secondary | ICD-10-CM | POA: Diagnosis present

## 2020-07-04 DIAGNOSIS — I472 Ventricular tachycardia: Secondary | ICD-10-CM | POA: Diagnosis present

## 2020-07-04 DIAGNOSIS — Z853 Personal history of malignant neoplasm of breast: Secondary | ICD-10-CM | POA: Diagnosis not present

## 2020-07-04 DIAGNOSIS — E440 Moderate protein-calorie malnutrition: Secondary | ICD-10-CM | POA: Diagnosis present

## 2020-07-04 DIAGNOSIS — I4811 Longstanding persistent atrial fibrillation: Secondary | ICD-10-CM | POA: Diagnosis present

## 2020-07-04 DIAGNOSIS — R079 Chest pain, unspecified: Secondary | ICD-10-CM

## 2020-07-04 DIAGNOSIS — N1831 Chronic kidney disease, stage 3a: Secondary | ICD-10-CM | POA: Diagnosis present

## 2020-07-04 DIAGNOSIS — R7303 Prediabetes: Secondary | ICD-10-CM | POA: Diagnosis present

## 2020-07-04 DIAGNOSIS — E785 Hyperlipidemia, unspecified: Secondary | ICD-10-CM | POA: Diagnosis present

## 2020-07-04 DIAGNOSIS — K219 Gastro-esophageal reflux disease without esophagitis: Secondary | ICD-10-CM | POA: Diagnosis present

## 2020-07-04 DIAGNOSIS — Z79899 Other long term (current) drug therapy: Secondary | ICD-10-CM | POA: Diagnosis not present

## 2020-07-04 DIAGNOSIS — D509 Iron deficiency anemia, unspecified: Secondary | ICD-10-CM | POA: Diagnosis present

## 2020-07-04 DIAGNOSIS — Z7901 Long term (current) use of anticoagulants: Secondary | ICD-10-CM | POA: Diagnosis not present

## 2020-07-04 LAB — LIPID PANEL
Cholesterol: 148 mg/dL (ref 0–200)
HDL: 53 mg/dL (ref >40)
LDL Cholesterol: 85 mg/dL (ref 0–99)
Total CHOL/HDL Ratio: 2.8 RATIO
Triglycerides: 52 mg/dL (ref <150)
VLDL: 10 mg/dL (ref 0–40)

## 2020-07-04 LAB — BASIC METABOLIC PANEL
Anion gap: 5 (ref 5–15)
BUN: 9 mg/dL (ref 8–23)
CO2: 26 mmol/L (ref 22–32)
Calcium: 9 mg/dL (ref 8.9–10.3)
Chloride: 106 mmol/L (ref 98–111)
Creatinine, Ser: 0.92 mg/dL (ref 0.44–1.00)
GFR, Estimated: >60 mL/min (ref >60)
Glucose, Bld: 96 mg/dL (ref 70–99)
Potassium: 4.7 mmol/L (ref 3.5–5.1)
Sodium: 137 mmol/L (ref 135–145)

## 2020-07-04 LAB — CBC
HCT: 28.1 % — ABNORMAL LOW (ref 36.0–46.0)
Hemoglobin: 8.7 g/dL — ABNORMAL LOW (ref 12.0–15.0)
MCH: 26.5 pg (ref 26.0–34.0)
MCHC: 31 g/dL (ref 30.0–36.0)
MCV: 85.7 fL (ref 80.0–100.0)
Platelets: 318 10*3/uL (ref 150–400)
RBC: 3.28 MIL/uL — ABNORMAL LOW (ref 3.87–5.11)
RDW: 14.8 % (ref 11.5–15.5)
WBC: 6.4 10*3/uL (ref 4.0–10.5)
nRBC: 0 % (ref 0.0–0.2)

## 2020-07-04 LAB — ECHOCARDIOGRAM COMPLETE
Area-P 1/2: 5.84 cm2
Calc EF: 50.2 %
Height: 64 in
S' Lateral: 3.5 cm
Single Plane A2C EF: 57.1 %
Single Plane A4C EF: 49.2 %
Weight: 2400 oz

## 2020-07-04 LAB — APTT
aPTT: 103 seconds — ABNORMAL HIGH (ref 24–36)
aPTT: 67 seconds — ABNORMAL HIGH (ref 24–36)
aPTT: 69 seconds — ABNORMAL HIGH (ref 24–36)

## 2020-07-04 LAB — TROPONIN I (HIGH SENSITIVITY)
Troponin I (High Sensitivity): 64 ng/L — ABNORMAL HIGH (ref <18)
Troponin I (High Sensitivity): 3160 ng/L (ref <18)

## 2020-07-04 LAB — HEPARIN LEVEL (UNFRACTIONATED): Heparin Unfractionated: 0.87 IU/mL — ABNORMAL HIGH (ref 0.30–0.70)

## 2020-07-04 MED ORDER — MAGNESIUM SULFATE 4 GM/100ML IV SOLN
4.0000 g | Freq: Once | INTRAVENOUS | Status: DC
  Administered 2020-07-04: 4 g via INTRAVENOUS
  Filled 2020-07-04: qty 100

## 2020-07-04 MED ORDER — MAGNESIUM OXIDE -MG SUPPLEMENT 400 (240 MG) MG PO TABS
200.0000 mg | ORAL_TABLET | Freq: Two times a day (BID) | ORAL | Status: AC
  Administered 2020-07-04 – 2020-07-05 (×4): 200 mg via ORAL
  Filled 2020-07-04 (×4): qty 1

## 2020-07-04 NOTE — Progress Notes (Addendum)
Progress Note  Patient Name: [name] Date of Encounter: 07/04/2020  [facility] Cardiologist: Sherren Mocha, MD   Subjective   Currently comfortable, lying in bed.  No chest pain no shortness of breath  Inpatient Medications    Scheduled Meds: . amLODipine  10 mg Oral Daily  . aspirin EC  81 mg Oral Daily  . atorvastatin  40 mg Oral Daily  . budesonide  0.25 mg Nebulization BID  . feeding supplement  237 mL Oral BID BM  . gabapentin  100-200 mg Oral QHS  . pantoprazole  40 mg Oral Daily   Continuous Infusions: . heparin 600 Units/hr (07/04/20 0549)  . magnesium sulfate bolus IVPB     PRN Meds: acetaminophen, nitroGLYCERIN, ondansetron (ZOFRAN) IV   Vital Signs    Vitals:   07/03/20 1951 07/04/20 0426 07/04/20 0745 07/04/20 0750  BP:  91/72  109/70  Pulse:  (!) 56 71 64  Resp:   16   Temp:  98.2 F (36.8 C)  98 F (36.7 C)  TempSrc:  Oral  Oral  SpO2: 98%  98% 100%  Weight:      Height:        Intake/Output Summary (Last 24 hours) at 07/04/2020 1105 Last data filed at 07/04/2020 8144 Gross per 24 hour  Intake 600 ml  Output --  Net 600 ml   Last 3 Weights 07/03/2020 05/08/2020 03/07/2020  Weight (lbs) 150 lb 149 lb 150 lb  Weight (kg) 68.04 kg 67.586 kg 68.04 kg      Telemetry    Atrial fibrillation heart rate in the 50s to 60s- Personally Reviewed  ECG    Prior EKG with nonspecific ST-T wave changes- Personally Reviewed  Physical Exam   GEN: No acute distress.   Neck: No JVD Cardiac: RRR, 2/6 HSM, no rubs, or gallops.  Respiratory: Clear to auscultation bilaterally. GI: Soft, nontender, non-distended  MS: No edema; No deformity. Neuro:  Nonfocal  Psych: Normal affect   Labs    High Sensitivity Troponin:   Recent Labs  Lab 07/03/20 1159 07/03/20 1449 07/03/20 1659 07/03/20 2006 07/04/20 0452  TROPONINIHS 64* 1,029* 2,520* 3,872* 3,160*      Chemistry Recent Labs  Lab 07/03/20 0954 07/04/20 0341  NA 138 137  K  3.2* 4.7  CL 106 106  CO2 24 26  GLUCOSE 122* 96  BUN 9 9  CREATININE 0.84 0.92  CALCIUM 8.8* 9.0  GFRNONAA >60 >60  ANIONGAP 8 5     Hematology Recent Labs  Lab 07/03/20 0954 07/04/20 0341  WBC 7.2 6.4  RBC 3.50* 3.28*  HGB 9.3* 8.7*  HCT 30.0* 28.1*  MCV 85.7 85.7  MCH 26.6 26.5  MCHC 31.0 31.0  RDW 15.0 14.8  PLT 359 318    BNPNo results for input(s): BNP, PROBNP in the last 168 hours.   DDimer No results for input(s): DDIMER in the last 168 hours.   Radiology    DG Chest Port 1 View  Result Date: 07/03/2020 CLINICAL DATA:  Mid sternal chest pain radiating to the left arm beginning 1 hour ago. EXAM: PORTABLE CHEST 1 VIEW COMPARISON:  05/08/2020 and older studies. FINDINGS: Stable enlargement of the cardiopericardial silhouette. No mediastinal or hilar masses. Mild prominence of the interstitial lung markings and lung hyperexpansion stable from the prior exam. No evidence of pneumonia or pulmonary edema. No convincing pleural effusion or pneumothorax. Stable changes from prior left breast surgery. Skeletal structures are demineralized but grossly intact.  IMPRESSION: 1. No acute cardiopulmonary disease. 2. Stable cardiomegaly. Electronically Signed   By: Lajean Manes M.D.   On: 07/03/2020 10:17   CT Angio Chest/Abd/Pel for Dissection W and/or W/WO  Result Date: 07/03/2020 CLINICAL DATA:  Mid sternal chest pain radiating to left arm beginning 1 hour prior to arrival. Some nausea and shortness of breath. EXAM: CT ANGIOGRAPHY CHEST, ABDOMEN AND PELVIS TECHNIQUE: Multidetector CT imaging through the chest, abdomen and pelvis was performed using the standard protocol during bolus administration of intravenous contrast. Multiplanar reconstructed images and MIPs were obtained and reviewed to evaluate the vascular anatomy. CONTRAST:  4mL OMNIPAQUE IOHEXOL 350 MG/ML SOLN COMPARISON:  03/08/2018. FINDINGS: CTA CHEST FINDINGS Cardiovascular: Mild enlargement of the heart, with a right  heart enlargement predominance, mostly the right atrium. This is similar to the prior CT. No pericardial effusion. Left and right coronary artery calcifications/atherosclerosis. Great vessels are normal in caliber. No aortic dissection. No significant atherosclerosis. Aortic arch branch vessels are widely patent. Mediastinum/Nodes: No neck base, mediastinal or hilar masses. Several prominent shotty lymph nodes, pre-vascular and AP window, all subcentimeter, presumed reactive. Trachea and esophagus are unremarkable. Lungs/Pleura: Bilateral interstitial thickening. Mosaic pattern of attenuation. No lung consolidation. No mass or suspicious nodule. No pleural effusion or pneumothorax. Musculoskeletal: No fracture or acute finding.  No bone lesion. Review of the MIP images confirms the above findings. CTA ABDOMEN AND PELVIS FINDINGS VASCULAR Aorta: Normal in caliber.  Mild atherosclerosis.  No dissection. Celiac: Mild atherosclerotic plaque at the origin. No significant stenosis. No dissection. SMA: Patent without evidence of aneurysm, dissection, vasculitis or significant stenosis. Renals: Plaque at the origin of the left renal artery with narrowing estimated at 60%. Plaque at the origin of the right renal artery with no significant narrowing. IMA: At least mild narrowing at the origin due to atherosclerotic plaque. Vessel is patent. Inflow: Mild atherosclerotic plaque.  No narrowing or dissection. Veins: No obvious venous abnormality within the limitations of this arterial phase study. Review of the MIP images confirms the above findings. NON-VASCULAR Hepatobiliary: No focal liver abnormality is seen. Status post cholecystectomy. No biliary dilatation. Pancreas: Unremarkable. No pancreatic ductal dilatation or surrounding inflammatory changes. Spleen: Normal in size without focal abnormality. Adrenals/Urinary Tract: No adrenal masses. Bilateral renal cysts. No stones. No hydronephrosis. Normal ureters. Normal bladder.  Stomach/Bowel: Small hiatal hernia. Stomach otherwise unremarkable. Small bowel and colon are normal in caliber. No wall thickening. No inflammation. Numerous colonic diverticula mostly on the left. No diverticulitis. Normal appendix visualized. Lymphatic: No enlarged lymph nodes. Reproductive: Uterus and bilateral adnexa are unremarkable. Other: No abdominal wall hernia or abnormality. No abdominopelvic ascites. Musculoskeletal: No fracture or acute finding.  No bone lesion. Review of the MIP images confirms the above findings. IMPRESSION: CTA FINDINGS 1. No aortic dissection. No aneurysm. Mild atherosclerosis. No significant stenosis. 2. Atherosclerotic disease at the origin aortic branch vessels. Approximately 60% narrowing at the origin of the left renal artery and at least mild narrowing at the origin of the inferior mesenteric artery. No other evidence of a hemodynamically significant stenosis. NON CTA FINDINGS 1. No acute findings within the chest, abdomen or pelvis. 2. Cardiomegaly and left and right coronary artery atherosclerotic calcifications. 3. Lungs show chronic interstitial thickening. Mosaic attenuation is noted suggesting small airways disease. 4. Numerous colonic diverticula without evidence of diverticulitis. Electronically Signed   By: Lajean Manes M.D.   On: 07/03/2020 12:02    Cardiac Studies   ECHO report pending  Patient Profile  87 y.o. female here with unstable angina, longstanding persistent atrial fibrillation, hypertension with chronic kidney disease stage IIIa, hyperlipidemia  Assessment & Plan    Non-ST elevation myocardial infarction - Previously reviewed by Dr. Burt Knack, did not quite meet STEMI criteria.  CTA of chest showed no evidence of aortic syndrome or dissection.  Transferred to [employer].  Peak troponin was 3800 -Continue with IV heparin -Since she is pain-free awaiting cardiac catheterization on Tuesday.  Severely enlarged right ventricle and right  atrium - Noted on preliminary review of echocardiogram.  Consider right and left heart catheterization with oxygen saturations to exclude shunting.  There is no evidence of PE on CT -Has been feeling some shortness of breath recently she states.  Longstanding persistent atrial fibrillation - Holding rivaroxaban.  On IV heparin.  Chronic anemia - 8.7 hemoglobin.  Continue to monitor.  Last year 76.   For questions or updates, please contact [place] Please consult [email or web address] for contact info under        Signed, Candee Furbish, MD  07/04/2020, 11:05 AM     Will give p.o. Iris Pert now.  IV issues ongoing. No further pauses. 3.2 sec earlier today.   Candee Furbish, MD

## 2020-07-04 NOTE — Progress Notes (Signed)
Notified by central tele pt had 3.26 sec pause- pt asymptomatic will continue to monitor closely

## 2020-07-04 NOTE — Progress Notes (Signed)
  Echocardiogram 2D Echocardiogram has been performed.  [name] 07/04/2020, 9:51 AM

## 2020-07-04 NOTE — Progress Notes (Signed)
ANTICOAGULATION CONSULT NOTE - Follow Up Consult  Pharmacy Consult for heparin Indication: Afib/NSTEMI  Labs: Recent Labs    07/03/20 0954 07/03/20 1159 07/03/20 1449 07/03/20 1659 07/03/20 2006 07/04/20 0341  HGB 9.3*  --   --   --   --  8.7*  HCT 30.0*  --   --   --   --  28.1*  PLT 359  --   --   --   --  318  APTT  --   --   --   --  121* 103*  HEPARINUNFRC  --   --   --   --  >1.10* 0.87*  CREATININE 0.84  --   --   --   --  0.92  TROPONINIHS 9   < > 1,029* 2,520* 3,872*  --    < > = values in this interval not displayed.    Assessment: 87yo female remains slightly supratherapeutic on heparin after rate change; no gtt issues or signs of bleeding per RN.  Goal of Therapy:  aPTT 66-102 seconds   Plan:  Will decrease heparin gttslightly to 600 units/hr and check level in 8 hours.    Wynona Neat, PharmD, BCPS  07/04/2020,5:44 AM

## 2020-07-04 NOTE — Progress Notes (Signed)
[place] for Heparin Indication: chest pain/ACS - STEMI  Allergies  Allergen Reactions  . Chocolate Other (See Comments)    Cough, sweating   . Lisinopril Other (See Comments)    Cough, sweating  . Peanuts [Peanut Oil] Cough    Patient Measurements: Height: 5\' 4"  (162.6 cm) Weight: 68 kg (150 lb) IBW/kg (Calculated) : 54.7 Heparin Dosing Weight: 68kg  Vital Signs: Temp: 98.4 F (36.9 C) (05/29 1305) Temp Source: Oral (05/29 1305) BP: 108/74 (05/29 1305) Pulse Rate: 72 (05/29 1305)  Labs: Recent Labs    07/03/20 0954 07/03/20 1159 07/03/20 1659 07/03/20 2006 07/03/20 2006 07/04/20 0341 07/04/20 0452 07/04/20 1414 07/04/20 1740  HGB 9.3*  --   --   --   --  8.7*  --   --   --   HCT 30.0*  --   --   --   --  28.1*  --   --   --   PLT 359  --   --   --   --  318  --   --   --   APTT  --   --   --  121*   < > 103*  --  67* 69*  HEPARINUNFRC  --   --   --  >1.10*  --  0.87*  --   --   --   CREATININE 0.84  --   --   --   --  0.92  --   --   --   TROPONINIHS 9   < > 2,520* 3,872*  --   --  3,160*  --   --    < > = values in this interval not displayed.    Estimated Creatinine Clearance: 43.1 mL/min (by C-G formula based on SCr of 0.92 mg/dL).   Medical History: Past Medical History:  Diagnosis Date  . Arthritis    "all over"  . Atrial fibrillation ([place])    Diagnosed ~2009  . Breast cancer, left breast ([place]) 1998   S/P chemo and mastectomy   . GERD (gastroesophageal reflux disease)   . Hyperlipidemia   . Hypertension   . Left shoulder pain    "MRI showed pinched nerve" - per pt  . OSA on CPAP   . Pneumonia    "4-5 times" (01/06/2015)  . Type II diabetes mellitus ([facility])     Medications:  Medications Prior to Admission  Medication Sig Dispense Refill Last Dose  . amLODipine (NORVASC) 10 MG tablet Take 10 mg by mouth.   07/02/2020 at Unknown time  . Cholecalciferol (VITAMIN D) 2000 units tablet Take 2,000 Units by  mouth daily.   07/02/2020 at Unknown time  . FLOVENT HFA 110 MCG/ACT inhaler TAKE 2 PUFFS BY MOUTH TWICE A DAY (Patient taking differently: Inhale 2 puffs into the lungs in the morning and at bedtime.) 12 g 0 07/02/2020 at Unknown time  . gabapentin (NEURONTIN) 100 MG capsule Take 100-200 mg by mouth at bedtime.   07/02/2020 at Unknown time  . metoprolol succinate (TOPROL-XL) 100 MG 24 hr tablet Take 100 mg by mouth daily.   07/02/2020 at 1900  . omeprazole (PRILOSEC) 20 MG capsule Take 1 capsule (20 mg total) by mouth daily. 14 capsule 0 07/02/2020 at Unknown time  . rivaroxaban (XARELTO) 20 MG TABS tablet Take 20 mg by mouth every morning.    07/02/2020 at 1900  . simvastatin (ZOCOR) 10 MG tablet Take 10 mg by  mouth at bedtime.   07/02/2020 at Unknown time   Scheduled:  . amLODipine  10 mg Oral Daily  . aspirin EC  81 mg Oral Daily  . atorvastatin  40 mg Oral Daily  . budesonide  0.25 mg Nebulization BID  . feeding supplement  237 mL Oral BID BM  . gabapentin  100-200 mg Oral QHS  . magnesium oxide  200 mg Oral BID  . pantoprazole  40 mg Oral Daily   Infusions:  . heparin 600 Units/hr (07/04/20 0549)   Assessment: 87 year old female with a history of diabetes, hypertension, hyperlipidemia, atrial fibrillation on Xarelto and chronic kidney disease found to have STEMI at [facility].  Patient's last dose of Xarelto 5/27pm. APTT earlier came back at 67 after heparin rate reduced by 50 units/hr. Heparin had been paused from under 30 minutes while attempting another PIV. Given large drop from 103 to 67 - repeat level was ordered which also came back therapeutic at 69. No s/sx of bleeding or infusion issues.   Goal of Therapy:  aPTT 66-102 Monitor platelets by anticoagulation protocol: Yes   Plan:  Will continue heparin infusion at 600 units/hr  Order aPTT/HL daily until correlate Monitor for s/sx of bleeding and CBC  F/u heparin plan for post-cath  Antonietta Jewel, PharmD, [place] Pharmacist   Phone: [phone] 07/04/2020 7:19 PM  Please check AMION for all [place] phone numbers After 10:00 PM, call [place] [phone]

## 2020-07-04 NOTE — Plan of Care (Signed)
  Problem: Education: Goal: Knowledge of General Education information will improve Description: Including pain rating scale, medication(s)/side effects and non-pharmacologic comfort measures Outcome: Progressing   Problem: Nutrition: Goal: Adequate nutrition will be maintained Outcome: Completed/Met   Problem: Coping: Goal: Level of anxiety will decrease Outcome: Completed/Met   Problem: Elimination: Goal: Will not experience complications related to bowel motility Outcome: Completed/Met   Problem: Pain Managment: Goal: General experience of comfort will improve Outcome: Completed/Met   Problem: Cardiac: Goal: Ability to achieve and maintain adequate cardiopulmonary perfusion will improve Outcome: Completed/Met

## 2020-07-04 NOTE — Progress Notes (Signed)
Pt HR dropped to 35 and had a 2.77 pause- pt was sleeping and is asymptomatic Notified Cardiology on call with this information- will continue to monitor closely.

## 2020-07-04 NOTE — Progress Notes (Signed)
Contacted by Central Tele- 8 beats Vtach- pt with no symptoms- will continue to monitor closely

## 2020-07-04 NOTE — Progress Notes (Signed)
Patient was setup on CPAP of 5 cmH2O via FFM; tolerating at this time. RT will continue to monitor.

## 2020-07-04 NOTE — Progress Notes (Signed)
piv attemted x2 to rt hand, unsuccessful. Pressure dressing applied.Patient alert and oriented , instructed to call immediately if bleeding noted to sites. Patient verbalized understanding.

## 2020-07-04 NOTE — Progress Notes (Addendum)
Progress Note  Patient Name: [name] Date of Encounter: 07/04/2020  Primary Cardiologist: Sherren Mocha, MD  Subjective   Feeling good. No CP, SOB, dizziness, palpitations. Some bradycardia/pauses noted overnight. She was either sleeping or completely asymptomatic with these episodes. Last BP 109/71.  Inpatient Medications    Scheduled Meds: . amLODipine  10 mg Oral Daily  . aspirin EC  81 mg Oral Daily  . atorvastatin  40 mg Oral Daily  . budesonide  0.25 mg Nebulization BID  . feeding supplement  237 mL Oral BID BM  . gabapentin  100-200 mg Oral QHS  . pantoprazole  40 mg Oral Daily   Continuous Infusions: . heparin 600 Units/hr (07/04/20 0549)  . magnesium sulfate bolus IVPB     PRN Meds: acetaminophen, nitroGLYCERIN, ondansetron (ZOFRAN) IV   Vital Signs    Vitals:   07/03/20 1623 07/03/20 1951 07/04/20 0426 07/04/20 0745  BP: 127/80  91/72   Pulse: 87  (!) 56 71  Resp:    16  Temp:   98.2 F (36.8 C)   TempSrc:   Oral   SpO2:  98%  98%  Weight:      Height:        Intake/Output Summary (Last 24 hours) at 07/04/2020 0810 Last data filed at 07/03/2020 2130 Gross per 24 hour  Intake 360 ml  Output --  Net 360 ml   Last 3 Weights 07/03/2020 05/08/2020 03/07/2020  Weight (lbs) 150 lb 149 lb 150 lb  Weight (kg) 68.04 kg 67.586 kg 68.04 kg     Telemetry    Atrial fib, rates generally 60s but episodic bradycardia (transient) into upper 30s with occasional 2-4 sec pauses, 2 episodes NSVT overnight (7, 8 beats) - Personally Reviewed  ECG    Yesterday: atrial fib 82bpm, right axis deviation, probable low voltage extremity leads, old anteroseptal infarct - Personally Reviewed  Physical Exam   GEN: No acute distress.  HEENT: Normocephalic, atraumatic, sclera non-icteric. Neck: No JVD or bruits. Cardiac: Irregularly irregular, normal rate, no murmurs, rubs, or gallops.  Respiratory: Clear to auscultation bilaterally. Breathing is unlabored. GI:  Soft, nontender, non-distended, BS +x 4. MS: no deformity. Extremities: No clubbing or cyanosis. No edema. Distal pedal pulses are 2+ and equal bilaterally. Neuro:  AAOx3. Follows commands. Psych:  Responds to questions appropriately with a normal affect.  Labs    High Sensitivity Troponin:   Recent Labs  Lab 07/03/20 1159 07/03/20 1449 07/03/20 1659 07/03/20 2006 07/04/20 0452  TROPONINIHS 64* 1,029* 2,520* 3,872* 3,160*      Cardiac EnzymesNo results for input(s): TROPONINI in the last 168 hours. No results for input(s): TROPIPOC in the last 168 hours.   Chemistry Recent Labs  Lab 07/03/20 0954 07/04/20 0341  NA 138 137  K 3.2* 4.7  CL 106 106  CO2 24 26  GLUCOSE 122* 96  BUN 9 9  CREATININE 0.84 0.92  CALCIUM 8.8* 9.0  GFRNONAA >60 >60  ANIONGAP 8 5     Hematology Recent Labs  Lab 07/03/20 0954 07/04/20 0341  WBC 7.2 6.4  RBC 3.50* 3.28*  HGB 9.3* 8.7*  HCT 30.0* 28.1*  MCV 85.7 85.7  MCH 26.6 26.5  MCHC 31.0 31.0  RDW 15.0 14.8  PLT 359 318    BNPNo results for input(s): BNP, PROBNP in the last 168 hours.   DDimer No results for input(s): DDIMER in the last 168 hours.   Radiology    DG Chest [place] 1  View  Result Date: 07/03/2020 CLINICAL DATA:  Mid sternal chest pain radiating to the left arm beginning 1 hour ago. EXAM: PORTABLE CHEST 1 VIEW COMPARISON:  05/08/2020 and older studies. FINDINGS: Stable enlargement of the cardiopericardial silhouette. No mediastinal or hilar masses. Mild prominence of the interstitial lung markings and lung hyperexpansion stable from the prior exam. No evidence of pneumonia or pulmonary edema. No convincing pleural effusion or pneumothorax. Stable changes from prior left breast surgery. Skeletal structures are demineralized but grossly intact. IMPRESSION: 1. No acute cardiopulmonary disease. 2. Stable cardiomegaly. Electronically Signed   By: Lajean Manes M.D.   On: 07/03/2020 10:17   CT Angio Chest/Abd/Pel for  Dissection W and/or W/WO  Result Date: 07/03/2020 CLINICAL DATA:  Mid sternal chest pain radiating to left arm beginning 1 hour prior to arrival. Some nausea and shortness of breath. EXAM: CT ANGIOGRAPHY CHEST, ABDOMEN AND PELVIS TECHNIQUE: Multidetector CT imaging through the chest, abdomen and pelvis was performed using the standard protocol during bolus administration of intravenous contrast. Multiplanar reconstructed images and MIPs were obtained and reviewed to evaluate the vascular anatomy. CONTRAST:  80mL OMNIPAQUE IOHEXOL 350 MG/ML SOLN COMPARISON:  03/08/2018. FINDINGS: CTA CHEST FINDINGS Cardiovascular: Mild enlargement of the heart, with a right heart enlargement predominance, mostly the right atrium. This is similar to the prior CT. No pericardial effusion. Left and right coronary artery calcifications/atherosclerosis. Great vessels are normal in caliber. No aortic dissection. No significant atherosclerosis. Aortic arch branch vessels are widely patent. Mediastinum/Nodes: No neck base, mediastinal or hilar masses. Several prominent shotty lymph nodes, pre-vascular and AP window, all subcentimeter, presumed reactive. Trachea and esophagus are unremarkable. Lungs/Pleura: Bilateral interstitial thickening. Mosaic pattern of attenuation. No lung consolidation. No mass or suspicious nodule. No pleural effusion or pneumothorax. Musculoskeletal: No fracture or acute finding.  No bone lesion. Review of the MIP images confirms the above findings. CTA ABDOMEN AND PELVIS FINDINGS VASCULAR Aorta: Normal in caliber.  Mild atherosclerosis.  No dissection. Celiac: Mild atherosclerotic plaque at the origin. No significant stenosis. No dissection. SMA: Patent without evidence of aneurysm, dissection, vasculitis or significant stenosis. Renals: Plaque at the origin of the left renal artery with narrowing estimated at 60%. Plaque at the origin of the right renal artery with no significant narrowing. IMA: At least mild  narrowing at the origin due to atherosclerotic plaque. Vessel is patent. Inflow: Mild atherosclerotic plaque.  No narrowing or dissection. Veins: No obvious venous abnormality within the limitations of this arterial phase study. Review of the MIP images confirms the above findings. NON-VASCULAR Hepatobiliary: No focal liver abnormality is seen. Status post cholecystectomy. No biliary dilatation. Pancreas: Unremarkable. No pancreatic ductal dilatation or surrounding inflammatory changes. Spleen: Normal in size without focal abnormality. Adrenals/Urinary Tract: No adrenal masses. Bilateral renal cysts. No stones. No hydronephrosis. Normal ureters. Normal bladder. Stomach/Bowel: Small hiatal hernia. Stomach otherwise unremarkable. Small bowel and colon are normal in caliber. No wall thickening. No inflammation. Numerous colonic diverticula mostly on the left. No diverticulitis. Normal appendix visualized. Lymphatic: No enlarged lymph nodes. Reproductive: Uterus and bilateral adnexa are unremarkable. Other: No abdominal wall hernia or abnormality. No abdominopelvic ascites. Musculoskeletal: No fracture or acute finding.  No bone lesion. Review of the MIP images confirms the above findings. IMPRESSION: CTA FINDINGS 1. No aortic dissection. No aneurysm. Mild atherosclerosis. No significant stenosis. 2. Atherosclerotic disease at the origin aortic branch vessels. Approximately 60% narrowing at the origin of the left renal artery and at least mild narrowing at the origin of the  inferior mesenteric artery. No other evidence of a hemodynamically significant stenosis. NON CTA FINDINGS 1. No acute findings within the chest, abdomen or pelvis. 2. Cardiomegaly and left and right coronary artery atherosclerotic calcifications. 3. Lungs show chronic interstitial thickening. Mosaic attenuation is noted suggesting small airways disease. 4. Numerous colonic diverticula without evidence of diverticulitis. Electronically Signed   By:  Lajean Manes M.D.   On: 07/03/2020 12:02    Cardiac Studies   Pending  Patient Profile     87 y.o. female with HTN, HLD, longstanding persistent atrial fibrillation, breast CA, DM, OSA on CPAP, and CKD stage 3 (but most recently stage 2 by labs) who presented to [facility] with CP and NSTEMI.  Assessment & Plan    1. NSTEMI - peak troponin this far 3,872 - cardiac cath planned for Tuesday 5/31 (not taken to lab yesterday as symptoms had resolved and plan was to allow Xarelto washout) - she is on add-on board and I wrote orders - continue ASA, newly titrated statin - continue heparin per pharmacy - see below re: BB - of note, CTA was done to r/o dissection with findings above - mild aortic atherosclerosis, 60% L renal artery stenosis and some narrowing at inferior mesenteric artery, chronic interstitial lung thickening, colonic diverticula - echo pending  2. Essential HTN - H/p references holding lisinopril but home med rec performed and she is no longer on this  - continue amlodipine as tolerated - hold BB as below today  3. Hyperlipidemia - LDL 85 - simvastatin titrated to atorvastatin this admission  - baseline LFTs ordered for AM - if the patient is tolerating statin at time of follow-up appointment, would consider rechecking liver function/lipid panel in 6-8 weeks.  4. Longstanding persistent atrial fib - home Xarelto on hold, with heparin per pharmacy ordered - bradycardia/brief pauses noted overnight and this morning, asymptomatic - has OSA so question related to this - will resume CPAP qhs - hold Toprol this AM and consider resuming tomorrow AM at lower dose  5. OSA - resume CPAP QHS  6. NSVT - potassium has been repleted - Mg 1.7 -> will give 4g mag sulfate - continue to follow lytes  7. CKD stage II - creatinine stable  8. Acute on chronic anemia - prior Hgb noted to be 9.8 in 4/2, admitted at 9.3, to 8.7 today - check anemia panel - no bleeding reported -  ordered FOBT for next stool - will otherwise review additional recs with MD  9. Reported h/o DM2 by PMH but available A1Cs only c/w pre-DM - last A1C 2021 was 6.2 - will repeat tomorrow - CBG normal this AM - if A1C elevated, can consider adding CBG checks/SSI  For questions or updates, please contact [facility] Please consult [email or web address] for contact info under Cardiology/STEMI.  Signed, Charlie Pitter, PA-C 07/04/2020, 8:10 AM    Personally seen and examined. Agree with above.   Beta blocker held See my note.  Candee Furbish, MD

## 2020-07-05 DIAGNOSIS — I214 Non-ST elevation (NSTEMI) myocardial infarction: Secondary | ICD-10-CM | POA: Diagnosis not present

## 2020-07-05 LAB — HEPATIC FUNCTION PANEL
ALT: 9 U/L (ref 0–44)
AST: 25 U/L (ref 15–41)
Albumin: 3.4 g/dL — ABNORMAL LOW (ref 3.5–5.0)
Alkaline Phosphatase: 52 U/L (ref 38–126)
Bilirubin, Direct: <0.1 mg/dL (ref 0.0–0.2)
Total Bilirubin: 0.6 mg/dL (ref 0.3–1.2)
Total Protein: 7.2 g/dL (ref 6.5–8.1)

## 2020-07-05 LAB — BASIC METABOLIC PANEL
Anion gap: 6 (ref 5–15)
BUN: 13 mg/dL (ref 8–23)
CO2: 26 mmol/L (ref 22–32)
Calcium: 9.4 mg/dL (ref 8.9–10.3)
Chloride: 103 mmol/L (ref 98–111)
Creatinine, Ser: 0.96 mg/dL (ref 0.44–1.00)
GFR, Estimated: 58 mL/min — ABNORMAL LOW (ref >60)
Glucose, Bld: 100 mg/dL — ABNORMAL HIGH (ref 70–99)
Potassium: 4.4 mmol/L (ref 3.5–5.1)
Sodium: 135 mmol/L (ref 135–145)

## 2020-07-05 LAB — HEPARIN LEVEL (UNFRACTIONATED): Heparin Unfractionated: 0.27 IU/mL — ABNORMAL LOW (ref 0.30–0.70)

## 2020-07-05 LAB — RETICULOCYTES
Immature Retic Fract: 11.4 % (ref 2.3–15.9)
RBC.: 3.16 MIL/uL — ABNORMAL LOW (ref 3.87–5.11)
Retic Count, Absolute: 35.7 10*3/uL (ref 19.0–186.0)
Retic Ct Pct: 1.1 % (ref 0.4–3.1)

## 2020-07-05 LAB — VITAMIN B12: Vitamin B-12: 392 pg/mL (ref 180–914)

## 2020-07-05 LAB — CBC
HCT: 26.7 % — ABNORMAL LOW (ref 36.0–46.0)
Hemoglobin: 8.4 g/dL — ABNORMAL LOW (ref 12.0–15.0)
MCH: 26.7 pg (ref 26.0–34.0)
MCHC: 31.5 g/dL (ref 30.0–36.0)
MCV: 84.8 fL (ref 80.0–100.0)
Platelets: 306 10*3/uL (ref 150–400)
RBC: 3.15 MIL/uL — ABNORMAL LOW (ref 3.87–5.11)
RDW: 14.9 % (ref 11.5–15.5)
WBC: 6.9 10*3/uL (ref 4.0–10.5)
nRBC: 0 % (ref 0.0–0.2)

## 2020-07-05 LAB — FERRITIN: Ferritin: 36 ng/mL (ref 11–307)

## 2020-07-05 LAB — IRON AND TIBC
Iron: 25 ug/dL — ABNORMAL LOW (ref 28–170)
Saturation Ratios: 7 % — ABNORMAL LOW (ref 10.4–31.8)
TIBC: 353 ug/dL (ref 250–450)
UIBC: 328 ug/dL

## 2020-07-05 LAB — MAGNESIUM: Magnesium: 1.7 mg/dL (ref 1.7–2.4)

## 2020-07-05 LAB — FOLATE: Folate: 10.8 ng/mL (ref >5.9)

## 2020-07-05 LAB — APTT: aPTT: 70 seconds — ABNORMAL HIGH (ref 24–36)

## 2020-07-05 MED ORDER — ANGIOPLASTY BOOK
Freq: Once | Status: AC
  Filled 2020-07-05: qty 1

## 2020-07-05 MED ORDER — SODIUM CHLORIDE 0.9 % WEIGHT BASED INFUSION: 3.0000 mL/kg/h | INTRAVENOUS | Status: AC

## 2020-07-05 MED ORDER — SODIUM CHLORIDE 0.9 % WEIGHT BASED INFUSION: 1.0000 mL/kg/h | INTRAVENOUS | Status: DC

## 2020-07-05 MED ORDER — SODIUM CHLORIDE 0.9 % IV SOLN: 250.0000 mL | INTRAVENOUS | Status: DC | PRN

## 2020-07-05 MED ORDER — "THROMBI-PAD 3""X3"" EX PADS"
1.0000 | MEDICATED_PAD | Freq: Once | CUTANEOUS | Status: DC
  Filled 2020-07-05: qty 1

## 2020-07-05 MED ORDER — ENSURE ENLIVE PO LIQD
237.0000 mL | Freq: Three times a day (TID) | ORAL | Status: DC
  Administered 2020-07-05 – 2020-07-09 (×7): 237 mL via ORAL

## 2020-07-05 MED ORDER — SODIUM CHLORIDE 0.9 % WEIGHT BASED INFUSION
3.0000 mL/kg/h | INTRAVENOUS | Status: AC
  Administered 2020-07-06: 3 mL/kg/h via INTRAVENOUS

## 2020-07-05 MED ORDER — SODIUM CHLORIDE 0.9% FLUSH: 3.0000 mL | INTRAVENOUS | Status: DC | PRN

## 2020-07-05 MED ORDER — ASPIRIN 81 MG PO CHEW
81.0000 mg | CHEWABLE_TABLET | ORAL | Status: AC
  Administered 2020-07-06: 81 mg via ORAL
  Filled 2020-07-05: qty 1

## 2020-07-05 MED ORDER — ADULT MULTIVITAMIN W/MINERALS CH
1.0000 | ORAL_TABLET | Freq: Every day | ORAL | Status: DC
  Administered 2020-07-05 – 2020-07-09 (×5): 1 via ORAL
  Filled 2020-07-05 (×5): qty 1

## 2020-07-05 MED ORDER — SODIUM CHLORIDE 0.9% FLUSH
3.0000 mL | Freq: Two times a day (BID) | INTRAVENOUS | Status: DC
Start: 2020-07-05 — End: 2020-07-09
  Administered 2020-07-07 – 2020-07-09 (×4): 3 mL via INTRAVENOUS

## 2020-07-05 MED ORDER — ASPIRIN 81 MG PO CHEW: 81.0000 mg | CHEWABLE_TABLET | ORAL | Status: DC

## 2020-07-05 MED ORDER — SODIUM CHLORIDE 0.9% FLUSH
3.0000 mL | Freq: Two times a day (BID) | INTRAVENOUS | Status: DC
  Administered 2020-07-07: 3 mL via INTRAVENOUS

## 2020-07-05 NOTE — Progress Notes (Addendum)
Progress Note  Patient Name: [name] Date of Encounter: 07/05/2020  [facility] Cardiologist: Sherren Mocha, MD   Subjective   No chest pain or dyspnea.   Inpatient Medications    Scheduled Meds: . amLODipine  10 mg Oral Daily  . [START ON 07/06/2020] aspirin  81 mg Oral Pre-Cath  . aspirin EC  81 mg Oral Daily  . atorvastatin  40 mg Oral Daily  . budesonide  0.25 mg Nebulization BID  . feeding supplement  237 mL Oral BID BM  . gabapentin  100-200 mg Oral QHS  . magnesium oxide  200 mg Oral BID  . pantoprazole  40 mg Oral Daily  . sodium chloride flush  3 mL Intravenous Q12H   Continuous Infusions: . sodium chloride    . [START ON 07/06/2020] sodium chloride     Followed by  . [START ON 07/06/2020] sodium chloride    . heparin 600 Units/hr (07/04/20 1931)   PRN Meds: sodium chloride, acetaminophen, nitroGLYCERIN, ondansetron (ZOFRAN) IV, sodium chloride flush   Vital Signs    Vitals:   07/04/20 2200 07/05/20 0506 07/05/20 0748 07/05/20 0830  BP:  (!) 134/57  114/69  Pulse: 99 83 70   Resp:  18 18   Temp:  98.6 F (37 C) 98.4 F (36.9 C)   TempSrc:  Oral Oral   SpO2: 96% 96%    Weight:      Height:        Intake/Output Summary (Last 24 hours) at 07/05/2020 0850 Last data filed at 07/05/2020 0836 Gross per 24 hour  Intake 499.07 ml  Output --  Net 499.07 ml   Last 3 Weights 07/03/2020 05/08/2020 03/07/2020  Weight (lbs) 150 lb 149 lb 150 lb  Weight (kg) 68.04 kg 67.586 kg 68.04 kg      Telemetry    Atrial fib- Personally Reviewed  ECG    Atrial fib, new inferolateral T wave inversions- Personally Reviewed  Physical Exam   General: Well developed, well nourished, NAD  SKIN: warm, dry. No rashes. Neuro: No focal deficits  Musculoskeletal: Muscle strength 5/5 all ext  Psychiatric: Mood and affect normal  Neck: No JVD Lungs:Clear bilaterally, no wheezes, rhonci, crackles Cardiovascular: Irreg irreg. Systolic murmur.  Abdomen:Soft.  Bowel sounds present. Non-tender.  Extremities: No lower extremity edema  Labs    High Sensitivity Troponin:   Recent Labs  Lab 07/03/20 1159 07/03/20 1449 07/03/20 1659 07/03/20 2006 07/04/20 0452  TROPONINIHS 64* 1,029* 2,520* 3,872* 3,160*      Chemistry Recent Labs  Lab 07/03/20 0954 07/04/20 0341 07/05/20 0149  NA 138 137 135  K 3.2* 4.7 4.4  CL 106 106 103  CO2 24 26 26   GLUCOSE 122* 96 100*  BUN 9 9 13   CREATININE 0.84 0.92 0.96  CALCIUM 8.8* 9.0 9.4  PROT  --   --  7.2  ALBUMIN  --   --  3.4*  AST  --   --  25  ALT  --   --  9  ALKPHOS  --   --  52  BILITOT  --   --  0.6  GFRNONAA >60 >60 58*  ANIONGAP 8 5 6      Hematology Recent Labs  Lab 07/03/20 0954 07/04/20 0341 07/05/20 0149  WBC 7.2 6.4 6.9  RBC 3.50* 3.28* 3.15*  3.16*  HGB 9.3* 8.7* 8.4*  HCT 30.0* 28.1* 26.7*  MCV 85.7 85.7 84.8  MCH 26.6 26.5 26.7  MCHC 31.0  31.0 31.5  RDW 15.0 14.8 14.9  PLT 359 318 306    BNPNo results for input(s): BNP, PROBNP in the last 168 hours.   DDimer No results for input(s): DDIMER in the last 168 hours.   Radiology    DG Chest [place] 1 View  Result Date: 07/03/2020 CLINICAL DATA:  Mid sternal chest pain radiating to the left arm beginning 1 hour ago. EXAM: PORTABLE CHEST 1 VIEW COMPARISON:  05/08/2020 and older studies. FINDINGS: Stable enlargement of the cardiopericardial silhouette. No mediastinal or hilar masses. Mild prominence of the interstitial lung markings and lung hyperexpansion stable from the prior exam. No evidence of pneumonia or pulmonary edema. No convincing pleural effusion or pneumothorax. Stable changes from prior left breast surgery. Skeletal structures are demineralized but grossly intact. IMPRESSION: 1. No acute cardiopulmonary disease. 2. Stable cardiomegaly. Electronically Signed   By: Lajean Manes M.D.   On: 07/03/2020 10:17   ECHOCARDIOGRAM COMPLETE  Result Date: 07/04/2020    ECHOCARDIOGRAM REPORT   Patient Name:   [name]  [facility] Date of Exam: 07/04/2020 Medical Rec #:  [id number]          Height:       64.0 in Accession #:    [id number]         Weight:       150.0 lb Date of Birth:  10-08-1933          BSA:          1.731 m Patient Age:    87 years           BP:           109/70 mmHg Patient Gender: F                  HR:           73 bpm. Exam Location:  Inpatient Procedure: 2D Echo, 3D Echo, Cardiac Doppler and Color Doppler Indications:    R07.9* Chest pain, unspecified  History:        Patient has prior history of Echocardiogram examinations, most                 recent 01/07/2015. Abnormal ECG, Arrythmias:Atrial Fibrillation,                 Signs/Symptoms:Chest Pain and Shortness of Breath; Risk                 Factors:Current Smoker, Diabetes and Dyslipidemia.  Sonographer:    Roseanna Rainbow RDCS Referring Phys: [place]  Sonographer Comments: Suboptimal apical window. IMPRESSIONS  1. Left ventricular ejection fraction, by estimation, is 55 to 60%. The left ventricle has normal function. The left ventricle has no regional wall motion abnormalities. There is mild left ventricular hypertrophy. Left ventricular diastolic parameters are indeterminate.  2. Right ventricular systolic function is mildly reduced. The right ventricular size is severely enlarged. There is mildly elevated pulmonary artery systolic pressure.  3. Left atrial size was moderately dilated.  4. Right atrial size was severely dilated.  5. The mitral valve is normal in structure. Mild mitral valve regurgitation. No evidence of mitral stenosis.  6. Tricuspid valve regurgitation is severe.  7. The aortic valve is tricuspid. Aortic valve regurgitation is trivial. Mild aortic valve sclerosis is present, with no evidence of aortic valve stenosis.  8. Aortic dilatation noted. There is mild dilatation of the ascending aorta, measuring 40 mm.  9. The inferior vena cava is dilated in size  with <50% respiratory variability, suggesting right atrial pressure of 15  mmHg. FINDINGS  Left Ventricle: Left ventricular ejection fraction, by estimation, is 55 to 60%. The left ventricle has normal function. The left ventricle has no regional wall motion abnormalities. The left ventricular internal cavity size was normal in size. There is  mild left ventricular hypertrophy. Left ventricular diastolic parameters are indeterminate. Right Ventricle: The right ventricular size is severely enlarged. Right ventricular systolic function is mildly reduced. There is mildly elevated pulmonary artery systolic pressure. The tricuspid regurgitant velocity is 2.60 m/s, and with an assumed right atrial pressure of 15 mmHg, the estimated right ventricular systolic pressure is 10.1 mmHg. Left Atrium: Left atrial size was moderately dilated. Right Atrium: Right atrial size was severely dilated. Pericardium: There is no evidence of pericardial effusion. Mitral Valve: The mitral valve is normal in structure. Mild mitral valve regurgitation. No evidence of mitral valve stenosis. Tricuspid Valve: The tricuspid valve is normal in structure. Tricuspid valve regurgitation is severe. No evidence of tricuspid stenosis. Aortic Valve: The aortic valve is tricuspid. Aortic valve regurgitation is trivial. Mild aortic valve sclerosis is present, with no evidence of aortic valve stenosis. Pulmonic Valve: The pulmonic valve was normal in structure. Pulmonic valve regurgitation is trivial. No evidence of pulmonic stenosis. Aorta: Aortic dilatation noted. There is mild dilatation of the ascending aorta, measuring 40 mm. Venous: The inferior vena cava is dilated in size with less than 50% respiratory variability, suggesting right atrial pressure of 15 mmHg. IAS/Shunts: No atrial level shunt detected by color flow Doppler.  LEFT VENTRICLE PLAX 2D LVIDd:         4.80 cm LVIDs:         3.50 cm LV PW:         1.40 cm LV IVS:        1.10 cm LVOT diam:     1.90 cm LV SV:         53 LV SV Index:   30 LVOT Area:     2.84 cm  LV  Volumes (MOD) LV vol d, MOD A2C: 98.8 ml LV vol d, MOD A4C: 66.0 ml LV vol s, MOD A2C: 42.4 ml LV vol s, MOD A4C: 33.5 ml LV SV MOD A2C:     56.4 ml LV SV MOD A4C:     66.0 ml LV SV MOD BP:      42.2 ml RIGHT VENTRICLE            IVC RV S prime:     8.49 cm/s  IVC diam: 2.80 cm TAPSE (M-mode): 2.0 cm LEFT ATRIUM             Index       RIGHT ATRIUM           Index LA diam:        4.40 cm 2.54 cm/m  RA Area:     42.50 cm LA Vol (A2C):   70.4 ml 40.66 ml/m RA Volume:   194.00 ml 112.06 ml/m LA Vol (A4C):   47.9 ml 27.67 ml/m LA Biplane Vol: 61.9 ml 35.76 ml/m  AORTIC VALVE             PULMONIC VALVE LVOT Vmax:   91.30 cm/s  PR End Diast Vel: 2.30 msec LVOT Vmean:  55.800 cm/s LVOT VTI:    0.186 m  AORTA Ao Root diam: 3.10 cm Ao Asc diam:  4.20 cm MITRAL VALVE  TRICUSPID VALVE MV Area (PHT): 5.84 cm     TR Peak grad:   27.0 mmHg MV Decel Time: 130 msec     TR Vmax:        260.00 cm/s MV E velocity: 103.00 cm/s                             SHUNTS                             Systemic VTI:  0.19 m                             Systemic Diam: 1.90 cm Kirk Ruths MD Electronically signed by Kirk Ruths MD Signature Date/Time: 07/04/2020/11:20:25 AM    Final    CT Angio Chest/Abd/Pel for Dissection W and/or W/WO  Result Date: 07/03/2020 CLINICAL DATA:  Mid sternal chest pain radiating to left arm beginning 1 hour prior to arrival. Some nausea and shortness of breath. EXAM: CT ANGIOGRAPHY CHEST, ABDOMEN AND PELVIS TECHNIQUE: Multidetector CT imaging through the chest, abdomen and pelvis was performed using the standard protocol during bolus administration of intravenous contrast. Multiplanar reconstructed images and MIPs were obtained and reviewed to evaluate the vascular anatomy. CONTRAST:  38mL OMNIPAQUE IOHEXOL 350 MG/ML SOLN COMPARISON:  03/08/2018. FINDINGS: CTA CHEST FINDINGS Cardiovascular: Mild enlargement of the heart, with a right heart enlargement predominance, mostly the right atrium.  This is similar to the prior CT. No pericardial effusion. Left and right coronary artery calcifications/atherosclerosis. Great vessels are normal in caliber. No aortic dissection. No significant atherosclerosis. Aortic arch branch vessels are widely patent. Mediastinum/Nodes: No neck base, mediastinal or hilar masses. Several prominent shotty lymph nodes, pre-vascular and AP window, all subcentimeter, presumed reactive. Trachea and esophagus are unremarkable. Lungs/Pleura: Bilateral interstitial thickening. Mosaic pattern of attenuation. No lung consolidation. No mass or suspicious nodule. No pleural effusion or pneumothorax. Musculoskeletal: No fracture or acute finding.  No bone lesion. Review of the MIP images confirms the above findings. CTA ABDOMEN AND PELVIS FINDINGS VASCULAR Aorta: Normal in caliber.  Mild atherosclerosis.  No dissection. Celiac: Mild atherosclerotic plaque at the origin. No significant stenosis. No dissection. SMA: Patent without evidence of aneurysm, dissection, vasculitis or significant stenosis. Renals: Plaque at the origin of the left renal artery with narrowing estimated at 60%. Plaque at the origin of the right renal artery with no significant narrowing. IMA: At least mild narrowing at the origin due to atherosclerotic plaque. Vessel is patent. Inflow: Mild atherosclerotic plaque.  No narrowing or dissection. Veins: No obvious venous abnormality within the limitations of this arterial phase study. Review of the MIP images confirms the above findings. NON-VASCULAR Hepatobiliary: No focal liver abnormality is seen. Status post cholecystectomy. No biliary dilatation. Pancreas: Unremarkable. No pancreatic ductal dilatation or surrounding inflammatory changes. Spleen: Normal in size without focal abnormality. Adrenals/Urinary Tract: No adrenal masses. Bilateral renal cysts. No stones. No hydronephrosis. Normal ureters. Normal bladder. Stomach/Bowel: Small hiatal hernia. Stomach otherwise  unremarkable. Small bowel and colon are normal in caliber. No wall thickening. No inflammation. Numerous colonic diverticula mostly on the left. No diverticulitis. Normal appendix visualized. Lymphatic: No enlarged lymph nodes. Reproductive: Uterus and bilateral adnexa are unremarkable. Other: No abdominal wall hernia or abnormality. No abdominopelvic ascites. Musculoskeletal: No fracture or acute finding.  No bone lesion. Review of the MIP images confirms the  above findings. IMPRESSION: CTA FINDINGS 1. No aortic dissection. No aneurysm. Mild atherosclerosis. No significant stenosis. 2. Atherosclerotic disease at the origin aortic branch vessels. Approximately 60% narrowing at the origin of the left renal artery and at least mild narrowing at the origin of the inferior mesenteric artery. No other evidence of a hemodynamically significant stenosis. NON CTA FINDINGS 1. No acute findings within the chest, abdomen or pelvis. 2. Cardiomegaly and left and right coronary artery atherosclerotic calcifications. 3. Lungs show chronic interstitial thickening. Mosaic attenuation is noted suggesting small airways disease. 4. Numerous colonic diverticula without evidence of diverticulitis. Electronically Signed   By: Lajean Manes M.D.   On: 07/03/2020 12:02    Cardiac Studies   ECHO report pending  Patient Profile     87 y.o. female here with unstable angina, longstanding persistent atrial fibrillation, hypertension with chronic kidney disease stage IIIa, hyperlipidemia  Assessment & Plan    1. NSTEMI: Elevated troponin in setting of chest pain. CTA of chest showed no evidence of aortic syndrome or dissection. EKG with inferolateral T wave inversions. No chest pain this am.  -Plan cardiac cath tomorrow. NPO at midnight -Continue ASA, IV heparin, statin  2. Enlargement of both atria: Right heart cath planned tomorrow to exclude shunting per Dr. Marlou Porch.  3. Longstanding persistent atrial fibrillation: She is on  IV heparin. Rate controlled this am. Xarelto on hold awaiting cardiac cath.   4. Chronic anemia: H/H stable.   For questions or updates, please contact [place] Please consult [email or web address] for contact info under   Signed, Lauree Chandler, MD  07/05/2020, 8:50 AM

## 2020-07-05 NOTE — Progress Notes (Signed)
[place] for Heparin Indication: chest pain/ACS - STEMI  Allergies  Allergen Reactions  . Chocolate Other (See Comments)    Cough, sweating   . Lisinopril Other (See Comments)    Cough, sweating  . Peanuts [Peanut Oil] Cough    Patient Measurements: Height: 5\' 4"  (162.6 cm) Weight: 68 kg (150 lb) IBW/kg (Calculated) : 54.7 Heparin Dosing Weight: 68kg  Vital Signs: Temp: 98.6 F (37 C) (05/30 0506) Temp Source: Oral (05/30 0506) BP: 134/57 (05/30 0506) Pulse Rate: 83 (05/30 0506)  Labs: Recent Labs    07/03/20 0954 07/03/20 0954 07/03/20 1159 07/03/20 1659 07/03/20 2006 07/04/20 0341 07/04/20 0452 07/04/20 1414 07/04/20 1740 07/05/20 0149  HGB 9.3*  --   --   --   --  8.7*  --   --   --  8.4*  HCT 30.0*  --   --   --   --  28.1*  --   --   --  26.7*  PLT 359  --   --   --   --  318  --   --   --  306  APTT  --    < >  --   --  121* 103*  --  67* 69* 70*  HEPARINUNFRC  --   --   --   --  >1.10* 0.87*  --   --   --  0.27*  CREATININE 0.84  --   --   --   --  0.92  --   --   --  0.96  TROPONINIHS 9  --    < > 2,520* 3,872*  --  3,160*  --   --   --    < > = values in this interval not displayed.    Estimated Creatinine Clearance: 41.3 mL/min (by C-G formula based on SCr of 0.96 mg/dL).   Medical History: Past Medical History:  Diagnosis Date  . Arthritis    "all over"  . Atrial fibrillation ([place])    Diagnosed ~2009  . Breast cancer, left breast ([place]) 1998   S/P chemo and mastectomy   . GERD (gastroesophageal reflux disease)   . Hyperlipidemia   . Hypertension   . Left shoulder pain    "MRI showed pinched nerve" - per pt  . OSA on CPAP   . Pneumonia    "4-5 times" (01/06/2015)  . Type II diabetes mellitus ([facility])     Medications:  Medications Prior to Admission  Medication Sig Dispense Refill Last Dose  . amLODipine (NORVASC) 10 MG tablet Take 10 mg by mouth.   07/02/2020 at Unknown time  . Cholecalciferol  (VITAMIN D) 2000 units tablet Take 2,000 Units by mouth daily.   07/02/2020 at Unknown time  . FLOVENT HFA 110 MCG/ACT inhaler TAKE 2 PUFFS BY MOUTH TWICE A DAY (Patient taking differently: Inhale 2 puffs into the lungs in the morning and at bedtime.) 12 g 0 07/02/2020 at Unknown time  . gabapentin (NEURONTIN) 100 MG capsule Take 100-200 mg by mouth at bedtime.   07/02/2020 at Unknown time  . metoprolol succinate (TOPROL-XL) 100 MG 24 hr tablet Take 100 mg by mouth daily.   07/02/2020 at 1900  . omeprazole (PRILOSEC) 20 MG capsule Take 1 capsule (20 mg total) by mouth daily. 14 capsule 0 07/02/2020 at Unknown time  . rivaroxaban (XARELTO) 20 MG TABS tablet Take 20 mg by mouth every morning.    07/02/2020 at 1900  .  simvastatin (ZOCOR) 10 MG tablet Take 10 mg by mouth at bedtime.   07/02/2020 at Unknown time   Scheduled:  . amLODipine  10 mg Oral Daily  . [START ON 07/06/2020] aspirin  81 mg Oral Pre-Cath  . aspirin EC  81 mg Oral Daily  . atorvastatin  40 mg Oral Daily  . budesonide  0.25 mg Nebulization BID  . feeding supplement  237 mL Oral BID BM  . gabapentin  100-200 mg Oral QHS  . magnesium oxide  200 mg Oral BID  . pantoprazole  40 mg Oral Daily  . sodium chloride flush  3 mL Intravenous Q12H   Infusions:  . sodium chloride    . [START ON 07/06/2020] sodium chloride     Followed by  . [START ON 07/06/2020] sodium chloride    . heparin 600 Units/hr (07/04/20 1931)   Assessment: 87 year old female with a history of diabetes, hypertension, hyperlipidemia, atrial fibrillation on Xarelto and chronic kidney disease found to have STEMI at [facility]. Cardiac cath planned for Tuesday 5/31.  Patient's last dose of Xarelto 5/27 pm. APTT therapeutic at 70, heparin level previously elevated and now slightly low at 0.27. No s/sx of bleeding or infusion issues. CBC stable.  Goal of Therapy:  Heparin level 0.3-0.7 units/ml aPTT 66-102 seconds Monitor platelets by anticoagulation protocol: Yes   Plan:   Will continue heparin infusion at 600 units/hr  Order aPTT/HL daily until correlate Monitor for s/sx of bleeding and CBC  F/u heparin plan for post-cath  Fara Olden, PharmD PGY-1 Pharmacy Resident 07/05/2020 8:02 AM Please see AMION for all pharmacy numbers

## 2020-07-05 NOTE — Progress Notes (Signed)
Pt had unsuccessful IV attempt prior to shift change and was reported to me that site was oozing and pressure dressing applied. Upon completing bedside report-site noted to have bled through dressing. Pressure held and new dressing applied. Pt does have IV heparin infusing below the above noted IV attempt. I discussed this with [facility] if placing a thrombi pad would be appropriate intervention. Provider on call Dr Vickki Muff paged with verbal order for thrombi pad. Will continue to monitor. Jessie Foot, RN

## 2020-07-05 NOTE — Progress Notes (Signed)
Initial Nutrition Assessment  DOCUMENTATION CODES:   Non-severe (moderate) malnutrition in context of chronic illness  INTERVENTION:    Ensure Enlive po TID, each supplement provides 350 kcal and 20 grams of protein  Continue MVI with minerals daily  NUTRITION DIAGNOSIS:   Moderate Malnutrition related to chronic illness (CKD-3, persistent A fib) as evidenced by mild fat depletion,moderate fat depletion,moderate muscle depletion,severe muscle depletion.  GOAL:   Patient will meet greater than or equal to 90% of their needs  MONITOR:   PO intake,Supplement acceptance  REASON FOR ASSESSMENT:   Malnutrition Screening Tool    ASSESSMENT:   87 yo female admitted with chest pain. PMH includes HTN, CKD-3, A fib, GERD, HLD, PNA, DM-2, breast cancer.   Patient reports that she has lost a lot of weight. She is unsure how much, but says that she has been eating poorly at home. She lives with her daughter who prepares most of her meals. She drinks 2-3 Ensure supplements per day at home.   Labs reviewed. Medications reviewed and include mag ox, MVI with minerals.  Weight history reviewed.  No recent weight changes noted this year. Question accuracy of recorded weight.  NUTRITION - FOCUSED PHYSICAL EXAM:  Flowsheet Row Most Recent Value  Orbital Region Mild depletion  Upper Arm Region Moderate depletion  Thoracic and Lumbar Region Mild depletion  Buccal Region Mild depletion  Temple Region Severe depletion  Clavicle Bone Region Moderate depletion  Clavicle and Acromion Bone Region Moderate depletion  Scapular Bone Region Moderate depletion  Dorsal Hand Moderate depletion  Patellar Region Moderate depletion  Anterior Thigh Region Moderate depletion  Posterior Calf Region Moderate depletion  Edema (RD Assessment) None  Hair Reviewed  Eyes Reviewed  Mouth Reviewed  Skin Reviewed  Nails Reviewed       Diet Order:   Diet Order            Diet NPO time specified  Except for: Sips with Meds  Diet effective ____           Diet Heart Room service appropriate? Yes; Fluid consistency: Thin  Diet effective now                 EDUCATION NEEDS:   Not appropriate for education at this time  Skin:  Skin Assessment: Reviewed RN Assessment  Last BM:  5/29  Height:   Ht Readings from Last 1 Encounters:  07/03/20 5\' 4"  (1.626 m)    Weight:   Wt Readings from Last 1 Encounters:  07/03/20 68 kg    Ideal Body Weight:  54.5 kg  BMI:  Body mass index is 25.75 kg/m.  Estimated Nutritional Needs:   Kcal:  1700-1900  Protein:  80-95 gm  Fluid:  >/= 1.8 L    Lucas Mallow, RD, LDN, CNSC Please refer to Amion for contact information.

## 2020-07-06 LAB — BASIC METABOLIC PANEL
Anion gap: 8 (ref 5–15)
BUN: 14 mg/dL (ref 8–23)
CO2: 27 mmol/L (ref 22–32)
Calcium: 9.6 mg/dL (ref 8.9–10.3)
Chloride: 99 mmol/L (ref 98–111)
Creatinine, Ser: 1 mg/dL (ref 0.44–1.00)
GFR, Estimated: 56 mL/min — ABNORMAL LOW (ref >60)
Glucose, Bld: 116 mg/dL — ABNORMAL HIGH (ref 70–99)
Potassium: 4.1 mmol/L (ref 3.5–5.1)
Sodium: 134 mmol/L — ABNORMAL LOW (ref 135–145)

## 2020-07-06 LAB — CBC
HCT: 26.5 % — ABNORMAL LOW (ref 36.0–46.0)
Hemoglobin: 8.2 g/dL — ABNORMAL LOW (ref 12.0–15.0)
MCH: 26.3 pg (ref 26.0–34.0)
MCHC: 30.9 g/dL (ref 30.0–36.0)
MCV: 84.9 fL (ref 80.0–100.0)
Platelets: 322 10*3/uL (ref 150–400)
RBC: 3.12 MIL/uL — ABNORMAL LOW (ref 3.87–5.11)
RDW: 14.8 % (ref 11.5–15.5)
WBC: 6.9 10*3/uL (ref 4.0–10.5)
nRBC: 0 % (ref 0.0–0.2)

## 2020-07-06 LAB — HEMOGLOBIN A1C
Hgb A1c MFr Bld: 6 % — ABNORMAL HIGH (ref 4.8–5.6)
Mean Plasma Glucose: 126 mg/dL

## 2020-07-06 LAB — MAGNESIUM: Magnesium: 1.9 mg/dL (ref 1.7–2.4)

## 2020-07-06 LAB — APTT: aPTT: 52 seconds — ABNORMAL HIGH (ref 24–36)

## 2020-07-06 LAB — HEPARIN LEVEL (UNFRACTIONATED): Heparin Unfractionated: <0.1 IU/mL — ABNORMAL LOW (ref 0.30–0.70)

## 2020-07-06 MED ORDER — SODIUM CHLORIDE 0.9 % IV SOLN
510.0000 mg | INTRAVENOUS | Status: DC
  Administered 2020-07-06: 510 mg via INTRAVENOUS
  Filled 2020-07-06: qty 17

## 2020-07-06 MED ORDER — MAGNESIUM SULFATE 2 GM/50ML IV SOLN
2.0000 g | Freq: Once | INTRAVENOUS | Status: AC
  Administered 2020-07-06: 2 g via INTRAVENOUS
  Filled 2020-07-06: qty 50

## 2020-07-06 MED ORDER — DIPHENHYDRAMINE HCL 50 MG/ML IJ SOLN
25.0000 mg | Freq: Once | INTRAMUSCULAR | Status: AC
  Administered 2020-07-06: 25 mg via INTRAVENOUS
  Filled 2020-07-06: qty 1

## 2020-07-06 NOTE — Progress Notes (Signed)
Patient complained of severe itching all over her body after 57 mls of the IV ferumoxytol given. IV stopped and discussed with pharmacist and PA. Orders received to give IV benadryl 25mg  and after given patient reported the itching stopped within 5-10 minutes. No further symptoms, ferumoxytol discontinued and added to patient allergy list. B Bhagat PA notified of above.

## 2020-07-06 NOTE — Progress Notes (Addendum)
Progress Note  Patient Name: [name] Date of Encounter: 07/06/2020  [facility] Cardiologist: Sherren Mocha, MD   Subjective   Doing well this morning.   Inpatient Medications    Scheduled Meds: . amLODipine  10 mg Oral Daily  . aspirin EC  81 mg Oral Daily  . atorvastatin  40 mg Oral Daily  . budesonide  0.25 mg Nebulization BID  . feeding supplement  237 mL Oral TID BM  . gabapentin  100-200 mg Oral QHS  . multivitamin with minerals  1 tablet Oral Daily  . pantoprazole  40 mg Oral Daily  . sodium chloride flush  3 mL Intravenous Q12H  . sodium chloride flush  3 mL Intravenous Q12H  . Thrombi-Pad  1 each Topical Once   Continuous Infusions: . sodium chloride    . sodium chloride    . sodium chloride    . sodium chloride 1 mL/kg/hr (07/06/20 0514)  . heparin 600 Units/hr (07/06/20 0344)   PRN Meds: sodium chloride, sodium chloride, acetaminophen, nitroGLYCERIN, ondansetron (ZOFRAN) IV, sodium chloride flush, sodium chloride flush   Vital Signs    Vitals:   07/05/20 2035 07/05/20 2038 07/06/20 0414 07/06/20 0837  BP:   120/67   Pulse:  98 74   Resp:  18 19   Temp:   98.3 F (36.8 C)   TempSrc:   Oral   SpO2: 98%  100% 99%  Weight:      Height:        Intake/Output Summary (Last 24 hours) at 07/06/2020 0850 Last data filed at 07/06/2020 0344 Gross per 24 hour  Intake 111.45 ml  Output --  Net 111.45 ml   Last 3 Weights 07/03/2020 05/08/2020 03/07/2020  Weight (lbs) 150 lb 149 lb 150 lb  Weight (kg) 68.04 kg 67.586 kg 68.04 kg      Telemetry    Afib rates mostly in the 70-80s, one episode into the 140s overnight - Personally Reviewed  ECG    No new tracing.   Physical Exam   GEN: No acute distress.   Neck: No JVD Cardiac: Irreg Irreg, no murmurs, rubs, or gallops.  Respiratory: Clear to auscultation bilaterally. GI: Soft, nontender, non-distended  MS: No edema; No deformity. Neuro:  Nonfocal  Psych: Normal affect   Labs     High Sensitivity Troponin:   Recent Labs  Lab 07/03/20 1159 07/03/20 1449 07/03/20 1659 07/03/20 2006 07/04/20 0452  TROPONINIHS 64* 1,029* 2,520* 3,872* 3,160*      Chemistry Recent Labs  Lab 07/04/20 0341 07/05/20 0149 07/06/20 0223  NA 137 135 134*  K 4.7 4.4 4.1  CL 106 103 99  CO2 26 26 27   GLUCOSE 96 100* 116*  BUN 9 13 14   CREATININE 0.92 0.96 1.00  CALCIUM 9.0 9.4 9.6  PROT  --  7.2  --   ALBUMIN  --  3.4*  --   AST  --  25  --   ALT  --  9  --   ALKPHOS  --  52  --   BILITOT  --  0.6  --   GFRNONAA >60 58* 56*  ANIONGAP 5 6 8      Hematology Recent Labs  Lab 07/04/20 0341 07/05/20 0149 07/06/20 0223  WBC 6.4 6.9 6.9  RBC 3.28* 3.15*  3.16* 3.12*  HGB 8.7* 8.4* 8.2*  HCT 28.1* 26.7* 26.5*  MCV 85.7 84.8 84.9  MCH 26.5 26.7 26.3  MCHC 31.0 31.5 30.9  RDW 14.8 14.9 14.8  PLT 318 306 322    BNPNo results for input(s): BNP, PROBNP in the last 168 hours.   DDimer No results for input(s): DDIMER in the last 168 hours.   Radiology    ECHOCARDIOGRAM COMPLETE  Result Date: 07/04/2020    ECHOCARDIOGRAM REPORT   Patient Name:   [name] [facility] Date of Exam: 07/04/2020 Medical Rec #:  [id number]          Height:       64.0 in Accession #:    [id number]         Weight:       150.0 lb Date of Birth:  1933/10/21          BSA:          1.731 m Patient Age:    87 years           BP:           109/70 mmHg Patient Gender: F                  HR:           73 bpm. Exam Location:  Inpatient Procedure: 2D Echo, 3D Echo, Cardiac Doppler and Color Doppler Indications:    R07.9* Chest pain, unspecified  History:        Patient has prior history of Echocardiogram examinations, most                 recent 01/07/2015. Abnormal ECG, Arrythmias:Atrial Fibrillation,                 Signs/Symptoms:Chest Pain and Shortness of Breath; Risk                 Factors:Current Smoker, Diabetes and Dyslipidemia.  Sonographer:    Roseanna Rainbow RDCS Referring Phys: [place]   Sonographer Comments: Suboptimal apical window. IMPRESSIONS  1. Left ventricular ejection fraction, by estimation, is 55 to 60%. The left ventricle has normal function. The left ventricle has no regional wall motion abnormalities. There is mild left ventricular hypertrophy. Left ventricular diastolic parameters are indeterminate.  2. Right ventricular systolic function is mildly reduced. The right ventricular size is severely enlarged. There is mildly elevated pulmonary artery systolic pressure.  3. Left atrial size was moderately dilated.  4. Right atrial size was severely dilated.  5. The mitral valve is normal in structure. Mild mitral valve regurgitation. No evidence of mitral stenosis.  6. Tricuspid valve regurgitation is severe.  7. The aortic valve is tricuspid. Aortic valve regurgitation is trivial. Mild aortic valve sclerosis is present, with no evidence of aortic valve stenosis.  8. Aortic dilatation noted. There is mild dilatation of the ascending aorta, measuring 40 mm.  9. The inferior vena cava is dilated in size with <50% respiratory variability, suggesting right atrial pressure of 15 mmHg. FINDINGS  Left Ventricle: Left ventricular ejection fraction, by estimation, is 55 to 60%. The left ventricle has normal function. The left ventricle has no regional wall motion abnormalities. The left ventricular internal cavity size was normal in size. There is  mild left ventricular hypertrophy. Left ventricular diastolic parameters are indeterminate. Right Ventricle: The right ventricular size is severely enlarged. Right ventricular systolic function is mildly reduced. There is mildly elevated pulmonary artery systolic pressure. The tricuspid regurgitant velocity is 2.60 m/s, and with an assumed right atrial pressure of 15 mmHg, the estimated right ventricular systolic pressure is 33.8 mmHg. Left Atrium: Left atrial size  was moderately dilated. Right Atrium: Right atrial size was severely dilated. Pericardium:  There is no evidence of pericardial effusion. Mitral Valve: The mitral valve is normal in structure. Mild mitral valve regurgitation. No evidence of mitral valve stenosis. Tricuspid Valve: The tricuspid valve is normal in structure. Tricuspid valve regurgitation is severe. No evidence of tricuspid stenosis. Aortic Valve: The aortic valve is tricuspid. Aortic valve regurgitation is trivial. Mild aortic valve sclerosis is present, with no evidence of aortic valve stenosis. Pulmonic Valve: The pulmonic valve was normal in structure. Pulmonic valve regurgitation is trivial. No evidence of pulmonic stenosis. Aorta: Aortic dilatation noted. There is mild dilatation of the ascending aorta, measuring 40 mm. Venous: The inferior vena cava is dilated in size with less than 50% respiratory variability, suggesting right atrial pressure of 15 mmHg. IAS/Shunts: No atrial level shunt detected by color flow Doppler.  LEFT VENTRICLE PLAX 2D LVIDd:         4.80 cm LVIDs:         3.50 cm LV PW:         1.40 cm LV IVS:        1.10 cm LVOT diam:     1.90 cm LV SV:         53 LV SV Index:   30 LVOT Area:     2.84 cm  LV Volumes (MOD) LV vol d, MOD A2C: 98.8 ml LV vol d, MOD A4C: 66.0 ml LV vol s, MOD A2C: 42.4 ml LV vol s, MOD A4C: 33.5 ml LV SV MOD A2C:     56.4 ml LV SV MOD A4C:     66.0 ml LV SV MOD BP:      42.2 ml RIGHT VENTRICLE            IVC RV S prime:     8.49 cm/s  IVC diam: 2.80 cm TAPSE (M-mode): 2.0 cm LEFT ATRIUM             Index       RIGHT ATRIUM           Index LA diam:        4.40 cm 2.54 cm/m  RA Area:     42.50 cm LA Vol (A2C):   70.4 ml 40.66 ml/m RA Volume:   194.00 ml 112.06 ml/m LA Vol (A4C):   47.9 ml 27.67 ml/m LA Biplane Vol: 61.9 ml 35.76 ml/m  AORTIC VALVE             PULMONIC VALVE LVOT Vmax:   91.30 cm/s  PR End Diast Vel: 2.30 msec LVOT Vmean:  55.800 cm/s LVOT VTI:    0.186 m  AORTA Ao Root diam: 3.10 cm Ao Asc diam:  4.20 cm MITRAL VALVE                TRICUSPID VALVE MV Area (PHT): 5.84 cm      TR Peak grad:   27.0 mmHg MV Decel Time: 130 msec     TR Vmax:        260.00 cm/s MV E velocity: 103.00 cm/s                             SHUNTS                             Systemic VTI:  0.19 m  Systemic Diam: 1.90 cm Kirk Ruths MD Electronically signed by Kirk Ruths MD Signature Date/Time: 07/04/2020/11:20:25 AM    Final     Cardiac Studies   Echo: 07/04/20  IMPRESSIONS    1. Left ventricular ejection fraction, by estimation, is 55 to 60%. The  left ventricle has normal function. The left ventricle has no regional  wall motion abnormalities. There is mild left ventricular hypertrophy.  Left ventricular diastolic parameters  are indeterminate.  2. Right ventricular systolic function is mildly reduced. The right  ventricular size is severely enlarged. There is mildly elevated pulmonary  artery systolic pressure.  3. Left atrial size was moderately dilated.  4. Right atrial size was severely dilated.  5. The mitral valve is normal in structure. Mild mitral valve  regurgitation. No evidence of mitral stenosis.  6. Tricuspid valve regurgitation is severe.  7. The aortic valve is tricuspid. Aortic valve regurgitation is trivial.  Mild aortic valve sclerosis is present, with no evidence of aortic valve  stenosis.  8. Aortic dilatation noted. There is mild dilatation of the ascending  aorta, measuring 40 mm.  9. The inferior vena cava is dilated in size with <50% respiratory  variability, suggesting right atrial pressure of 15 mmHg.   Patient Profile     87 y.o. female here with unstable angina, longstanding persistent atrial fibrillation, hypertension with chronic kidney disease stage IIIa, hyperlipidemia who presented with NSTEMI.   Assessment & Plan    1. NSTEMI: Elevated troponin in setting of chest pain. CTA of chest showed no evidence of aortic syndrome or dissection. EKG with inferolateral T wave inversions. hsTn peaked at 3872, now down  trending.  -- remains on IV heparin, ASA, statin -- planned for cardiac cath today  2. Enlargement of both atria: Right heart cath planned to exclude shunting per Dr. Marlou Porch.  3. Persistant atrial fibrillation: Remains on IV heparin. Remains rate controlled.  -- Xarelto on hold awaiting cardiac cath.   4. Chronic anemia: Hgb 8.2 this morning. Reports she did have episodes of BRBPR last week, but noted on the toilet paper. No blood in stool or urine.  -- Iron 25, T sat 7 -- will order for IV iron x2 for today and tomorrow  5. HLD: on Zocor PTA ( but reports she wasn't taking). LDL 85 -- now on atorva 40mg  daily  6. Hypomag: 1.7>>1.9 -- suppl  7. HTN: on norvasc 10mg    8. Sinus Pauses: mostly <3 secs -- Toprol has been held  For questions or updates, please contact [place] Please consult [email or web address] for contact info under        Signed, Reino Bellis, NP  07/06/2020, 8:50 AM    I have personally seen and examined this patient. I agree with the assessment and plan as outlined above.  She has no chest pain this am. Admitted with a NSTEMI. Chronic anemia noted. Plans for IV iron infusion. Cardiac cath today. Holding Toprol for sinus pauses.   Lauree Chandler 07/06/2020 9:26 AM

## 2020-07-06 NOTE — Progress Notes (Signed)
    Decision made to delay cath until tomorrow with a recheck of CBC in the morning. Patient update on POC. Diet ordered for today.   Barnet Pall, NP-C 07/06/2020, 11:41 AM Pager: [phone]

## 2020-07-06 NOTE — Progress Notes (Signed)
[place] for Heparin Indication: chest pain/ACS - STEMI  Labs: Recent Labs    07/03/20 0954 07/03/20 1659 07/03/20 2006 07/04/20 0341 07/04/20 0452 07/04/20 1414 07/04/20 1740 07/05/20 0149 07/06/20 0223  HGB  --   --   --  8.7*  --   --   --  8.4* 8.2*  HCT  --   --   --  28.1*  --   --   --  26.7* 26.5*  PLT  --   --   --  318  --   --   --  306 322  APTT   < >  --  121* 103*  --    < > 69* 70* 52*  HEPARINUNFRC   < >  --  >1.10* 0.87*  --   --   --  0.27* <0.10*  CREATININE  --   --   --  0.92  --   --   --  0.96 1.00  TROPONINIHS  --  2,520* 3,872*  --  3,160*  --   --   --   --    < > = values in this interval not displayed.    Assessment: 87 year old female with a history of diabetes, hypertension, hyperlipidemia, atrial fibrillation on Xarelto and chronic kidney disease found to have STEMI at [facility]. Cardiac cath planned for Tuesday 5/31.  Patient's last dose of Xarelto 5/27 pm. Heparin level this am <0.1 units/ml.No issues noted with infusion.  She has been therapeutic on 600 units/hr heparin  Some bleeding reported last night.  Hg 8.2, aPTT 52 sec  Goal of Therapy:  Heparin level 0.3-0.7 units/ml aPTT 66-102 seconds Monitor platelets by anticoagulation protocol: Yes   Plan:  Given bleeding last night and inconsistent lab value this am will continue heparin infusion at 600 units/hr  F/u heparin plan for post-cath  Thanks for allowing pharmacy to be a part of this patient's care.  Excell Seltzer, PharmD Clinical Pharmacist

## 2020-07-07 ENCOUNTER — Encounter (HOSPITAL_COMMUNITY)
Admission: EM | Disposition: A | Discharge status: Home / Self Care | Payer: Self-pay | Source: Home / Self Care | Attending: Cardiovascular Disease

## 2020-07-07 DIAGNOSIS — I200 Unstable angina: Secondary | ICD-10-CM | POA: Diagnosis not present

## 2020-07-07 HISTORY — PX: LEFT HEART CATH AND CORONARY ANGIOGRAPHY: CATH118249

## 2020-07-07 LAB — CBC
HCT: 27.4 % — ABNORMAL LOW (ref 36.0–46.0)
Hemoglobin: 8.6 g/dL — ABNORMAL LOW (ref 12.0–15.0)
MCH: 26.6 pg (ref 26.0–34.0)
MCHC: 31.4 g/dL (ref 30.0–36.0)
MCV: 84.8 fL (ref 80.0–100.0)
Platelets: 313 10*3/uL (ref 150–400)
RBC: 3.23 MIL/uL — ABNORMAL LOW (ref 3.87–5.11)
RDW: 14.8 % (ref 11.5–15.5)
WBC: 6.4 10*3/uL (ref 4.0–10.5)
nRBC: 0.3 % — ABNORMAL HIGH (ref 0.0–0.2)

## 2020-07-07 LAB — BASIC METABOLIC PANEL
Anion gap: 10 (ref 5–15)
BUN: 17 mg/dL (ref 8–23)
CO2: 26 mmol/L (ref 22–32)
Calcium: 9.6 mg/dL (ref 8.9–10.3)
Chloride: 98 mmol/L (ref 98–111)
Creatinine, Ser: 1.11 mg/dL — ABNORMAL HIGH (ref 0.44–1.00)
GFR, Estimated: 49 mL/min — ABNORMAL LOW (ref >60)
Glucose, Bld: 113 mg/dL — ABNORMAL HIGH (ref 70–99)
Potassium: 4.4 mmol/L (ref 3.5–5.1)
Sodium: 134 mmol/L — ABNORMAL LOW (ref 135–145)

## 2020-07-07 LAB — MAGNESIUM: Magnesium: 2.2 mg/dL (ref 1.7–2.4)

## 2020-07-07 LAB — HEPARIN LEVEL (UNFRACTIONATED): Heparin Unfractionated: 0.13 IU/mL — ABNORMAL LOW (ref 0.30–0.70)

## 2020-07-07 LAB — APTT: aPTT: 53 seconds — ABNORMAL HIGH (ref 24–36)

## 2020-07-07 SURGERY — LEFT HEART CATH AND CORONARY ANGIOGRAPHY
Anesthesia: LOCAL

## 2020-07-07 MED ORDER — ACETAMINOPHEN 325 MG PO TABS: 650.0000 mg | ORAL_TABLET | ORAL | Status: DC | PRN

## 2020-07-07 MED ORDER — HYDRALAZINE HCL 20 MG/ML IJ SOLN: 10.0000 mg | INTRAMUSCULAR | Status: AC | PRN

## 2020-07-07 MED ORDER — HEPARIN (PORCINE) IN NACL 1000-0.9 UT/500ML-% IV SOLN
INTRAVENOUS | Status: DC | PRN
  Administered 2020-07-07 (×3): 500 mL

## 2020-07-07 MED ORDER — SODIUM CHLORIDE 0.9 % WEIGHT BASED INFUSION
3.0000 mL/kg/h | INTRAVENOUS | Status: DC
  Administered 2020-07-07: 3 mL/kg/h via INTRAVENOUS

## 2020-07-07 MED ORDER — LIDOCAINE HCL (PF) 1 % IJ SOLN
INTRAMUSCULAR | Status: DC | PRN
  Administered 2020-07-07: 5 mL

## 2020-07-07 MED ORDER — IOHEXOL 350 MG/ML SOLN
INTRAVENOUS | Status: DC | PRN
  Administered 2020-07-07: 40 mL

## 2020-07-07 MED ORDER — ASPIRIN 81 MG PO CHEW
81.0000 mg | CHEWABLE_TABLET | ORAL | Status: AC
  Administered 2020-07-07: 81 mg via ORAL
  Filled 2020-07-07: qty 1

## 2020-07-07 MED ORDER — HEPARIN (PORCINE) IN NACL 1000-0.9 UT/500ML-% IV SOLN
INTRAVENOUS | Status: AC
  Filled 2020-07-07: qty 1000

## 2020-07-07 MED ORDER — HEPARIN SODIUM (PORCINE) 1000 UNIT/ML IJ SOLN
INTRAMUSCULAR | Status: DC | PRN
  Administered 2020-07-07: 3500 [IU] via INTRAVENOUS

## 2020-07-07 MED ORDER — SODIUM CHLORIDE 0.9 % IV SOLN: 250.0000 mL | INTRAVENOUS | Status: DC | PRN

## 2020-07-07 MED ORDER — SODIUM CHLORIDE 0.9% FLUSH
3.0000 mL | Freq: Two times a day (BID) | INTRAVENOUS | Status: DC
  Administered 2020-07-08 – 2020-07-09 (×2): 3 mL via INTRAVENOUS

## 2020-07-07 MED ORDER — FENTANYL CITRATE (PF) 100 MCG/2ML IJ SOLN
INTRAMUSCULAR | Status: AC
  Filled 2020-07-07: qty 2

## 2020-07-07 MED ORDER — MIDAZOLAM HCL 2 MG/2ML IJ SOLN
INTRAMUSCULAR | Status: AC
  Filled 2020-07-07: qty 2

## 2020-07-07 MED ORDER — VERAPAMIL HCL 2.5 MG/ML IV SOLN
INTRAVENOUS | Status: AC
  Filled 2020-07-07: qty 2

## 2020-07-07 MED ORDER — SODIUM CHLORIDE 0.9% FLUSH: 3.0000 mL | INTRAVENOUS | Status: DC | PRN

## 2020-07-07 MED ORDER — MIDAZOLAM HCL 2 MG/2ML IJ SOLN
INTRAMUSCULAR | Status: DC | PRN
  Administered 2020-07-07: 1 mg via INTRAVENOUS

## 2020-07-07 MED ORDER — ONDANSETRON HCL 4 MG/2ML IJ SOLN: 4.0000 mg | Freq: Four times a day (QID) | INTRAMUSCULAR | Status: DC | PRN

## 2020-07-07 MED ORDER — LIDOCAINE HCL (PF) 1 % IJ SOLN
INTRAMUSCULAR | Status: AC
  Filled 2020-07-07: qty 30

## 2020-07-07 MED ORDER — HEPARIN SODIUM (PORCINE) 1000 UNIT/ML IJ SOLN
INTRAMUSCULAR | Status: AC
  Filled 2020-07-07: qty 1

## 2020-07-07 MED ORDER — LABETALOL HCL 5 MG/ML IV SOLN: 10.0000 mg | INTRAVENOUS | Status: AC | PRN

## 2020-07-07 MED ORDER — SODIUM CHLORIDE 0.9 % IV SOLN: INTRAVENOUS | Status: AC

## 2020-07-07 MED ORDER — HEPARIN (PORCINE) IN NACL 2-0.9 UNITS/ML
INTRAMUSCULAR | Status: DC | PRN
  Administered 2020-07-07: 10 mL via INTRA_ARTERIAL

## 2020-07-07 MED ORDER — FENTANYL CITRATE (PF) 100 MCG/2ML IJ SOLN
INTRAMUSCULAR | Status: DC | PRN
  Administered 2020-07-07: 25 ug via INTRAVENOUS

## 2020-07-07 MED ORDER — SODIUM CHLORIDE 0.9 % WEIGHT BASED INFUSION: 1.0000 mL/kg/h | INTRAVENOUS | Status: DC

## 2020-07-07 SURGICAL SUPPLY — 11 items
CATH 5FR JL3.5 JR4 ANG PIG MP (CATHETERS) ×2 IMPLANT
DEVICE RAD COMP TR BAND LRG (VASCULAR PRODUCTS) ×2 IMPLANT
GLIDESHEATH SLEND SS 6F .021 (SHEATH) ×2 IMPLANT
GUIDEWIRE INQWIRE 1.5J.035X260 (WIRE) ×1 IMPLANT
INQWIRE 1.5J .035X260CM (WIRE) ×2
KIT HEART LEFT (KITS) ×2 IMPLANT
PACK CARDIAC CATHETERIZATION (CUSTOM PROCEDURE TRAY) ×2 IMPLANT
SHEATH PROBE COVER 6X72 (BAG) ×2 IMPLANT
TRANSDUCER W/STOPCOCK (MISCELLANEOUS) ×2 IMPLANT
TUBING CIL FLEX 10 FLL-RA (TUBING) ×2 IMPLANT
WIRE HI TORQ VERSACORE-J 145CM (WIRE) ×2 IMPLANT

## 2020-07-07 NOTE — Interval H&P Note (Signed)
Cath Lab Visit (complete for each Cath Lab visit)  Clinical Evaluation Leading to the Procedure:   ACS: Yes.    Non-ACS:    Anginal Classification: CCS IV  Anti-ischemic medical therapy: Minimal Therapy (1 class of medications)  Non-Invasive Test Results: No non-invasive testing performed  Prior CABG: No previous CABG      History and Physical Interval Note:  07/07/2020 2:46 PM  [name]  has presented today for surgery, with the diagnosis of NSTEMI.  The various methods of treatment have been discussed with the patient and family. After consideration of risks, benefits and other options for treatment, the patient has consented to  Procedure(s): LEFT HEART CATH AND CORONARY ANGIOGRAPHY (N/A) as a surgical intervention.  The patient's history has been reviewed, patient examined, no change in status, stable for surgery.  I have reviewed the patient's chart and labs.  Questions were answered to the patient's satisfaction.     Larae Grooms

## 2020-07-07 NOTE — Progress Notes (Signed)
ANTICOAGULATION CONSULT NOTE  Pharmacy Consult for Heparin Indication: chest pain/ACS  Labs: Recent Labs    07/05/20 0149 07/06/20 0223 07/07/20 0400  HGB 8.4* 8.2* 8.6*  HCT 26.7* 26.5* 27.4*  PLT 306 322 313  APTT 70* 52* 53*  HEPARINUNFRC 0.27* <0.10* 0.13*  CREATININE 0.96 1.00 1.11*    Assessment: 87 year old female with a history of diabetes, hypertension, hyperlipidemia, atrial fibrillation on Xarelto and chronic kidney disease found to have STEMI at [facility]. Cardiac cath planned for Tuesday 5/31.  Patient's last dose of Xarelto 5/27 pm. Aptt low at 53s, Heparin level also low at 0.13 and now appears to be correlating. No further bleeding noted. Patient had severe itching with fereheme, infusion stopped and allergy added. Hemoglobin stable 8s, plt within normal limits.   Goal of Therapy:  Heparin level 0.3-0.7 units/ml aPTT 66-102 seconds Monitor platelets by anticoagulation protocol: Yes   Plan:  Given bleeding earlier this week and cath today will continue heparin infusion at 600 units/hr  F/u heparin plan for post-cath  Thanks for allowing pharmacy to be a part of this patient's care.  Erin Hearing PharmD., BCPS Clinical Pharmacist 07/07/2020 8:41 AM

## 2020-07-07 NOTE — H&P (View-Only) (Signed)
Progress Note  Patient Name: [name] Date of Encounter: 07/07/2020  [facility] Cardiologist: Sherren Mocha, MD   Subjective   No complaints overnight. Event with IV iron noted.   Planned for cath today.   Inpatient Medications    Scheduled Meds: . amLODipine  10 mg Oral Daily  . aspirin EC  81 mg Oral Daily  . atorvastatin  40 mg Oral Daily  . budesonide  0.25 mg Nebulization BID  . feeding supplement  237 mL Oral TID BM  . gabapentin  100-200 mg Oral QHS  . multivitamin with minerals  1 tablet Oral Daily  . pantoprazole  40 mg Oral Daily  . sodium chloride flush  3 mL Intravenous Q12H  . sodium chloride flush  3 mL Intravenous Q12H  . Thrombi-Pad  1 each Topical Once   Continuous Infusions: . sodium chloride    . sodium chloride    . sodium chloride    . sodium chloride 1 mL/kg/hr (07/06/20 0514)  . sodium chloride 1 mL/kg/hr (07/07/20 0641)  . heparin 600 Units/hr (07/07/20 0657)   PRN Meds: sodium chloride, sodium chloride, acetaminophen, nitroGLYCERIN, ondansetron (ZOFRAN) IV, sodium chloride flush, sodium chloride flush   Vital Signs    Vitals:   07/06/20 2002 07/07/20 0544 07/07/20 0821 07/07/20 0838  BP: 124/68 121/65  114/67  Pulse: 91 80    Resp: 19     Temp: 99 F (37.2 C) 98.5 F (36.9 C)    TempSrc: Oral Oral    SpO2:   97%   Weight:      Height:        Intake/Output Summary (Last 24 hours) at 07/07/2020 0840 Last data filed at 07/07/2020 0657 Gross per 24 hour  Intake 466.17 ml  Output --  Net 466.17 ml   Last 3 Weights 07/03/2020 05/08/2020 03/07/2020  Weight (lbs) 150 lb 149 lb 150 lb  Weight (kg) 68.04 kg 67.586 kg 68.04 kg      Telemetry    Afib rates-70s - Personally Reviewed  ECG    Afubf 72 bpm - Personally Reviewed  Physical Exam  Pleasant older female GEN: No acute distress.   Neck: No JVD Cardiac: Irreg Irreg, soft systolic murmur, no rubs, or gallops.  Respiratory: Clear to auscultation bilaterally. GI:  Soft, nontender, non-distended  MS: No edema; No deformity. Neuro:  Nonfocal  Psych: Normal affect   Labs    High Sensitivity Troponin:   Recent Labs  Lab 07/03/20 1159 07/03/20 1449 07/03/20 1659 07/03/20 2006 07/04/20 0452  TROPONINIHS 64* 1,029* 2,520* 3,872* 3,160*      Chemistry Recent Labs  Lab 07/05/20 0149 07/06/20 0223 07/07/20 0400  NA 135 134* 134*  K 4.4 4.1 4.4  CL 103 99 98  CO2 26 27 26   GLUCOSE 100* 116* 113*  BUN 13 14 17   CREATININE 0.96 1.00 1.11*  CALCIUM 9.4 9.6 9.6  PROT 7.2  --   --   ALBUMIN 3.4*  --   --   AST 25  --   --   ALT 9  --   --   ALKPHOS 52  --   --   BILITOT 0.6  --   --   GFRNONAA 58* 56* 49*  ANIONGAP 6 8 10      Hematology Recent Labs  Lab 07/05/20 0149 07/06/20 0223 07/07/20 0400  WBC 6.9 6.9 6.4  RBC 3.15*  3.16* 3.12* 3.23*  HGB 8.4* 8.2* 8.6*  HCT 26.7* 26.5*  27.4*  MCV 84.8 84.9 84.8  MCH 26.7 26.3 26.6  MCHC 31.5 30.9 31.4  RDW 14.9 14.8 14.8  PLT 306 322 313    BNPNo results for input(s): BNP, PROBNP in the last 168 hours.   DDimer No results for input(s): DDIMER in the last 168 hours.   Radiology    No results found.  Cardiac Studies   Echo: 07/04/20  IMPRESSIONS    1. Left ventricular ejection fraction, by estimation, is 55 to 60%. The  left ventricle has normal function. The left ventricle has no regional  wall motion abnormalities. There is mild left ventricular hypertrophy.  Left ventricular diastolic parameters  are indeterminate.  2. Right ventricular systolic function is mildly reduced. The right  ventricular size is severely enlarged. There is mildly elevated pulmonary  artery systolic pressure.  3. Left atrial size was moderately dilated.  4. Right atrial size was severely dilated.  5. The mitral valve is normal in structure. Mild mitral valve  regurgitation. No evidence of mitral stenosis.  6. Tricuspid valve regurgitation is severe.  7. The aortic valve is  tricuspid. Aortic valve regurgitation is trivial.  Mild aortic valve sclerosis is present, with no evidence of aortic valve  stenosis.  8. Aortic dilatation noted. There is mild dilatation of the ascending  aorta, measuring 40 mm.  9. The inferior vena cava is dilated in size with <50% respiratory  variability, suggesting right atrial pressure of 15 mmHg.   Patient Profile     87 y.o. female  here with unstable angina, longstanding persistent atrial fibrillation, hypertension with chronic kidney disease stage IIIa, hyperlipidemia who presented with NSTEMI.   Assessment & Plan    1. NSTEMI: Elevated troponin in setting of chest pain.CTA of chest showed no evidence of aortic syndrome or dissection.EKG with inferolateral T wave inversions. hsTn peaked at 3872, now down trending.  -- remains on IV heparin, ASA, statin -- planned for cardiac cath today as Hgb is stable  2. Enlargement of both atria: Right heart cath planned to exclude shunting per Dr. Marlou Porch.  3.Persistant atrial fibrillation: Remains on IV heparin. Rate controlled.  -- Xarelto on hold awaiting cardiac cath.   4. Chronic anemia: Hgb 8.6 this morning. Reports she did have episodes of BRBPR last week, but noted on the toilet paper. No blood in stool or urine.  -- Iron 25, T sat 7 -- attempted IV iron but developed itching, therefore DC'ed  5. HLD: on Zocor PTA ( but reports she wasn't taking). LDL 85 -- now on atorva 40mg  daily  6. Hypomag: 1.7>>1.9>>2.2 this morning  7. HTN: on norvasc 10mg    8. Sinus Pauses: mostly <3 secs -- Toprol has been held  For questions or updates, please contact [place] Please consult [email or web address] for contact info under        Signed, Reino Bellis, NP  07/07/2020, 8:40 AM    I have personally seen and examined this patient. I agree with the assessment and plan as outlined above.  H/H stable. She has chronic iron deficiency anemia. Plan to proceed with cardiac  cath today.   Lauree Chandler 07/07/2020 9:46 AM

## 2020-07-07 NOTE — Progress Notes (Addendum)
Progress Note  Patient Name: [name] Date of Encounter: 07/07/2020  [facility] Cardiologist: Sherren Mocha, MD   Subjective   No complaints overnight. Event with IV iron noted.   Planned for cath today.   Inpatient Medications    Scheduled Meds: . amLODipine  10 mg Oral Daily  . aspirin EC  81 mg Oral Daily  . atorvastatin  40 mg Oral Daily  . budesonide  0.25 mg Nebulization BID  . feeding supplement  237 mL Oral TID BM  . gabapentin  100-200 mg Oral QHS  . multivitamin with minerals  1 tablet Oral Daily  . pantoprazole  40 mg Oral Daily  . sodium chloride flush  3 mL Intravenous Q12H  . sodium chloride flush  3 mL Intravenous Q12H  . Thrombi-Pad  1 each Topical Once   Continuous Infusions: . sodium chloride    . sodium chloride    . sodium chloride    . sodium chloride 1 mL/kg/hr (07/06/20 0514)  . sodium chloride 1 mL/kg/hr (07/07/20 0641)  . heparin 600 Units/hr (07/07/20 0657)   PRN Meds: sodium chloride, sodium chloride, acetaminophen, nitroGLYCERIN, ondansetron (ZOFRAN) IV, sodium chloride flush, sodium chloride flush   Vital Signs    Vitals:   07/06/20 2002 07/07/20 0544 07/07/20 0821 07/07/20 0838  BP: 124/68 121/65  114/67  Pulse: 91 80    Resp: 19     Temp: 99 F (37.2 C) 98.5 F (36.9 C)    TempSrc: Oral Oral    SpO2:   97%   Weight:      Height:        Intake/Output Summary (Last 24 hours) at 07/07/2020 0840 Last data filed at 07/07/2020 0657 Gross per 24 hour  Intake 466.17 ml  Output --  Net 466.17 ml   Last 3 Weights 07/03/2020 05/08/2020 03/07/2020  Weight (lbs) 150 lb 149 lb 150 lb  Weight (kg) 68.04 kg 67.586 kg 68.04 kg      Telemetry    Afib rates-70s - Personally Reviewed  ECG    Afubf 72 bpm - Personally Reviewed  Physical Exam  Pleasant older female GEN: No acute distress.   Neck: No JVD Cardiac: Irreg Irreg, soft systolic murmur, no rubs, or gallops.  Respiratory: Clear to auscultation bilaterally. GI:  Soft, nontender, non-distended  MS: No edema; No deformity. Neuro:  Nonfocal  Psych: Normal affect   Labs    High Sensitivity Troponin:   Recent Labs  Lab 07/03/20 1159 07/03/20 1449 07/03/20 1659 07/03/20 2006 07/04/20 0452  TROPONINIHS 64* 1,029* 2,520* 3,872* 3,160*      Chemistry Recent Labs  Lab 07/05/20 0149 07/06/20 0223 07/07/20 0400  NA 135 134* 134*  K 4.4 4.1 4.4  CL 103 99 98  CO2 26 27 26   GLUCOSE 100* 116* 113*  BUN 13 14 17   CREATININE 0.96 1.00 1.11*  CALCIUM 9.4 9.6 9.6  PROT 7.2  --   --   ALBUMIN 3.4*  --   --   AST 25  --   --   ALT 9  --   --   ALKPHOS 52  --   --   BILITOT 0.6  --   --   GFRNONAA 58* 56* 49*  ANIONGAP 6 8 10      Hematology Recent Labs  Lab 07/05/20 0149 07/06/20 0223 07/07/20 0400  WBC 6.9 6.9 6.4  RBC 3.15*  3.16* 3.12* 3.23*  HGB 8.4* 8.2* 8.6*  HCT 26.7* 26.5*  27.4*  MCV 84.8 84.9 84.8  MCH 26.7 26.3 26.6  MCHC 31.5 30.9 31.4  RDW 14.9 14.8 14.8  PLT 306 322 313    BNPNo results for input(s): BNP, PROBNP in the last 168 hours.   DDimer No results for input(s): DDIMER in the last 168 hours.   Radiology    No results found.  Cardiac Studies   Echo: 07/04/20  IMPRESSIONS    1. Left ventricular ejection fraction, by estimation, is 55 to 60%. The  left ventricle has normal function. The left ventricle has no regional  wall motion abnormalities. There is mild left ventricular hypertrophy.  Left ventricular diastolic parameters  are indeterminate.  2. Right ventricular systolic function is mildly reduced. The right  ventricular size is severely enlarged. There is mildly elevated pulmonary  artery systolic pressure.  3. Left atrial size was moderately dilated.  4. Right atrial size was severely dilated.  5. The mitral valve is normal in structure. Mild mitral valve  regurgitation. No evidence of mitral stenosis.  6. Tricuspid valve regurgitation is severe.  7. The aortic valve is  tricuspid. Aortic valve regurgitation is trivial.  Mild aortic valve sclerosis is present, with no evidence of aortic valve  stenosis.  8. Aortic dilatation noted. There is mild dilatation of the ascending  aorta, measuring 40 mm.  9. The inferior vena cava is dilated in size with <50% respiratory  variability, suggesting right atrial pressure of 15 mmHg.   Patient Profile     87 y.o. female  here with unstable angina, longstanding persistent atrial fibrillation, hypertension with chronic kidney disease stage IIIa, hyperlipidemia who presented with NSTEMI.   Assessment & Plan    1. NSTEMI: Elevated troponin in setting of chest pain.CTA of chest showed no evidence of aortic syndrome or dissection.EKG with inferolateral T wave inversions. hsTn peaked at 3872, now down trending.  -- remains on IV heparin, ASA, statin -- planned for cardiac cath today as Hgb is stable  2. Enlargement of both atria: Right heart cath planned to exclude shunting per Dr. Marlou Porch.  3.Persistant atrial fibrillation: Remains on IV heparin. Rate controlled.  -- Xarelto on hold awaiting cardiac cath.   4. Chronic anemia: Hgb 8.6 this morning. Reports she did have episodes of BRBPR last week, but noted on the toilet paper. No blood in stool or urine.  -- Iron 25, T sat 7 -- attempted IV iron but developed itching, therefore DC'ed  5. HLD: on Zocor PTA ( but reports she wasn't taking). LDL 85 -- now on atorva 40mg  daily  6. Hypomag: 1.7>>1.9>>2.2 this morning  7. HTN: on norvasc 10mg    8. Sinus Pauses: mostly <3 secs -- Toprol has been held  For questions or updates, please contact [place] Please consult [email or web address] for contact info under        Signed, Reino Bellis, NP  07/07/2020, 8:40 AM    I have personally seen and examined this patient. I agree with the assessment and plan as outlined above.  H/H stable. She has chronic iron deficiency anemia. Plan to proceed with cardiac  cath today.   Lauree Chandler 07/07/2020 9:46 AM

## 2020-07-08 ENCOUNTER — Encounter (HOSPITAL_COMMUNITY): Payer: Self-pay | Admitting: Interventional Cardiology

## 2020-07-08 DIAGNOSIS — I200 Unstable angina: Secondary | ICD-10-CM | POA: Diagnosis not present

## 2020-07-08 LAB — CBC
HCT: 27.9 % — ABNORMAL LOW (ref 36.0–46.0)
Hemoglobin: 8.7 g/dL — ABNORMAL LOW (ref 12.0–15.0)
MCH: 26.6 pg (ref 26.0–34.0)
MCHC: 31.2 g/dL (ref 30.0–36.0)
MCV: 85.3 fL (ref 80.0–100.0)
Platelets: 321 10*3/uL (ref 150–400)
RBC: 3.27 MIL/uL — ABNORMAL LOW (ref 3.87–5.11)
RDW: 14.8 % (ref 11.5–15.5)
WBC: 6.5 10*3/uL (ref 4.0–10.5)
nRBC: 0 % (ref 0.0–0.2)

## 2020-07-08 LAB — BASIC METABOLIC PANEL
Anion gap: 9 (ref 5–15)
BUN: 14 mg/dL (ref 8–23)
CO2: 26 mmol/L (ref 22–32)
Calcium: 9.5 mg/dL (ref 8.9–10.3)
Chloride: 99 mmol/L (ref 98–111)
Creatinine, Ser: 0.97 mg/dL (ref 0.44–1.00)
GFR, Estimated: 58 mL/min — ABNORMAL LOW (ref >60)
Glucose, Bld: 93 mg/dL (ref 70–99)
Potassium: 4.4 mmol/L (ref 3.5–5.1)
Sodium: 134 mmol/L — ABNORMAL LOW (ref 135–145)

## 2020-07-08 MED ORDER — METOPROLOL TARTRATE 25 MG PO TABS
25.0000 mg | ORAL_TABLET | Freq: Two times a day (BID) | ORAL | Status: DC
  Administered 2020-07-08 – 2020-07-09 (×3): 25 mg via ORAL
  Filled 2020-07-08 (×3): qty 1

## 2020-07-08 MED ORDER — RIVAROXABAN 15 MG PO TABS
15.0000 mg | ORAL_TABLET | Freq: Every day | ORAL | Status: DC
  Administered 2020-07-08: 15 mg via ORAL
  Filled 2020-07-08: qty 1

## 2020-07-08 MED ORDER — GABAPENTIN 100 MG PO CAPS
100.0000 mg | ORAL_CAPSULE | Freq: Every day | ORAL | Status: DC
  Administered 2020-07-08: 100 mg via ORAL
  Filled 2020-07-08: qty 1

## 2020-07-08 MED ORDER — FE FUMARATE-B12-VIT C-FA-IFC PO CAPS
1.0000 | ORAL_CAPSULE | Freq: Two times a day (BID) | ORAL | Status: DC
  Administered 2020-07-08 – 2020-07-09 (×3): 1 via ORAL
  Filled 2020-07-08 (×4): qty 1

## 2020-07-08 NOTE — Progress Notes (Signed)
Nutrition Follow-up  DOCUMENTATION CODES:   Non-severe (moderate) malnutrition in context of chronic illness  INTERVENTION:    Continue Ensure Enlive po TID, each supplement provides 350 kcal and 20 grams of protein  Continue MVI with minerals daily  NUTRITION DIAGNOSIS:   Moderate Malnutrition related to chronic illness (CKD-3, persistent A fib) as evidenced by mild fat depletion,moderate fat depletion,moderate muscle depletion,severe muscle depletion.  Ongoing  GOAL:   Patient will meet greater than or equal to 90% of their needs   Met with intake of meals and supplements  MONITOR:   PO intake,Supplement acceptance  REASON FOR ASSESSMENT:   Malnutrition Screening Tool    ASSESSMENT:   87 yo female admitted with chest pain. PMH includes HTN, CKD-3, A fib, GERD, HLD, PNA, DM-2, breast cancer.   Patient states she has been eating well and drinking Ensure supplements 2-3 times per day. She has a good appetite and likes the Ensure supplements. She was drinking them at home and will continue when she is discharged.   Diet: Heart healthy Meal intakes: 75-100%  Labs reviewed. Na 134 Medications reviewed.   Diet Order:   Diet Order            Diet Heart Room service appropriate? Yes; Fluid consistency: Thin  Diet effective now                 EDUCATION NEEDS:   Not appropriate for education at this time  Skin:  Skin Assessment: Reviewed RN Assessment  Last BM:  6/2  Height:   Ht Readings from Last 1 Encounters:  07/03/20 5' 4"  (1.626 m)    Weight:   Wt Readings from Last 1 Encounters:  07/03/20 68 kg    Ideal Body Weight:  54.5 kg  BMI:  Body mass index is 25.75 kg/m.  Estimated Nutritional Needs:   Kcal:  1700-1900  Protein:  80-95 gm  Fluid:  >/= 1.8 L    Lucas Mallow, RD, LDN, CNSC Please refer to Amion for contact information.

## 2020-07-08 NOTE — Discharge Instructions (Signed)

## 2020-07-08 NOTE — Discharge Summary (Signed)
Discharge Summary    Patient ID: [name] MRN: [id number]; DOB: Nov 04, 1933  Admit date: 07/03/2020 Discharge date: 07/09/2020  PCP:  Elisabeth Cara, [facility] Cardiologist:  Sherren Mocha, MD   Discharge Diagnoses    Principal Problem:   Unstable angina [facility]) Active Problems:   Atrial fibrillation ([place])   Hypertension   HLD (hyperlipidemia)   Malnutrition of moderate degree   Chest pain of uncertain etiology   Diagnostic Studies/Procedures    Echo: 07/04/20  IMPRESSIONS    1. Left ventricular ejection fraction, by estimation, is 55 to 60%. The  left ventricle has normal function. The left ventricle has no regional  wall motion abnormalities. There is mild left ventricular hypertrophy.  Left ventricular diastolic parameters  are indeterminate.  2. Right ventricular systolic function is mildly reduced. The right  ventricular size is severely enlarged. There is mildly elevated pulmonary  artery systolic pressure.  3. Left atrial size was moderately dilated.  4. Right atrial size was severely dilated.  5. The mitral valve is normal in structure. Mild mitral valve  regurgitation. No evidence of mitral stenosis.  6. Tricuspid valve regurgitation is severe.  7. The aortic valve is tricuspid. Aortic valve regurgitation is trivial.  Mild aortic valve sclerosis is present, with no evidence of aortic valve  stenosis.  8. Aortic dilatation noted. There is mild dilatation of the ascending  aorta, measuring 40 mm.  9. The inferior vena cava is dilated in size with <50% respiratory  variability, suggesting right atrial pressure of 15 mmHg.   FINDINGS  Left Ventricle: Left ventricular ejection fraction, by estimation, is 55  to 60%. The left ventricle has normal function. The left ventricle has no  regional wall motion abnormalities. The left ventricular internal cavity  size was normal in size. There is  mild left ventricular  hypertrophy. Left ventricular diastolic parameters  are indeterminate.   Right Ventricle: The right ventricular size is severely enlarged. Right  ventricular systolic function is mildly reduced. There is mildly elevated  pulmonary artery systolic pressure. The tricuspid regurgitant velocity is  2.60 m/s, and with an assumed  right atrial pressure of 15 mmHg, the estimated right ventricular systolic  pressure is 89.1 mmHg.   Left Atrium: Left atrial size was moderately dilated.   Right Atrium: Right atrial size was severely dilated.   Pericardium: There is no evidence of pericardial effusion.   Mitral Valve: The mitral valve is normal in structure. Mild mitral valve  regurgitation. No evidence of mitral valve stenosis.   Tricuspid Valve: The tricuspid valve is normal in structure. Tricuspid  valve regurgitation is severe. No evidence of tricuspid stenosis.   Aortic Valve: The aortic valve is tricuspid. Aortic valve regurgitation is  trivial. Mild aortic valve sclerosis is present, with no evidence of  aortic valve stenosis.   Pulmonic Valve: The pulmonic valve was normal in structure. Pulmonic valve  regurgitation is trivial. No evidence of pulmonic stenosis.   Aorta: Aortic dilatation noted. There is mild dilatation of the ascending  aorta, measuring 40 mm.   Venous: The inferior vena cava is dilated in size with less than 50%  respiratory variability, suggesting right atrial pressure of 15 mmHg.   IAS/Shunts: No atrial level shunt detected by color flow Doppler.   Cath: 07/07/20   Mid RCA lesion is 25% stenosed.  LV end diastolic pressure is normal.  There is no aortic valve stenosis.   Nonobstructive CAD.  Continue medical therapy.  _____________   History of Present Illness     [name] is a 87 y.o. female with HTN, longstanding persistent atrial fibrillation, and CKD 3, who presented with chest pain on 07/03/20. [name] is an 87 year old woman  with medical problems outlined above, who presented to [facility] with chest pain.  The patient described a pain that started after she ate some food.  She described pain in the left upper chest into the back and down the left arm.  She described this as an ache.  There was no associated shortness of breath but she did have nausea.  No diaphoresis.  She stated that she would have felt better if she could have thrown up.  Her EKG showed some subtle changes of possible inferior injury but did not meet STEMI criteria.  Dr. Burt Knack discussed her case with Dr. Tamera Punt and we agreed that the patient should have a CT scan of the chest as she was complaining of pain radiating into the back and has a long history of hypertension.  The CTA of the chest demonstrated no evidence of acute aortic syndrome or dissection.  She was transferred to [facility] with concern that she may need to go emergently for cardiac catheterization if pain persists.  She did receive fentanyl 50 mcg prior to arrival here.  In route and on arrival to [facility], her chest pain had resolved.  She was feeling much better at the time of arrival.    Hospital Course     1. NSTEMI: Elevated troponin in setting of chest pain.CTA of chest showed no evidence of aortic syndrome or dissection.EKG with inferolateral T wave inversions.hsTn peaked at 3872. She was treated with IV heparin and underwent cardiac cath that showed mild non-obstructive CAD. Recommendations for continued medical therapy.   2.Persistantatrial fibrillation:Remained rate controlled. Xarelto resumed post cath but reduced from 20mg  to 15mg  daily per discussion with PharmD  3. Chronic anemia:Hgb dipped into the mid 8 range while receiving IVFs and heparin. Reports she did have episodes of BRBPR the week prior to admission, but noted on the toilet paper. No blood in stool or urine.  -- Iron 25, T sat 7 -- attempted IV iron but developed itching/allergic reaction  therefore was DC'ed.  -- will add PO iron at discharge   4. HLD:on Zocor PTA ( but reports she wasn't taking). LDL 85 -- now on atorva 40mg  daily -- FLP/LFTs in 8 weeks  5. Hypomag: 1.7>>1.9>>2.2 -- resolved with supplementation during admission  6. HTN: on norvasc 10mg    7. Sinus Pauses:mostly <3 secs, asymptomatic. As a result her Toprol was stopped but then developed rapid rates. Resumed metoprolol 25mg  BID with improvement. Did have intermittent pauses all <3 seconds and asymptomatic.   General: Well developed, well nourished, female appearing in no acute distress. Head: Normocephalic, atraumatic.  Neck: Supple without bruits, JVD. Lungs:  Resp regular and unlabored, CTA. Heart: Irreg Irreg, S1, S2, no S3, S4, + systolic murmur; no rub. Abdomen: Soft, non-tender, non-distended with normoactive bowel sounds. No hepatomegaly. No rebound/guarding. No obvious abdominal masses. Extremities: No clubbing, cyanosis, edema. Distal pedal pulses are 2+ bilaterally. Right radial cath site stable without bruising or hematoma Neuro: Alert and oriented X 3. Moves all extremities spontaneously. Psych: Normal affect.  Patient was seen by Dr. Angelena Form and deemed stable for discharge home. Follow up in the office has been arranged. Medications sent to the [facility] pharmacy prior to discharge. Educated by PharmD.  Did the patient have an acute coronary syndrome (MI, NSTEMI, STEMI, etc) this admission?:  No.   The elevated Troponin was due to the acute medical illness (demand ischemia).   _____________  Discharge Vitals Blood pressure 112/71, pulse 69, temperature 97.8 F (36.6 C), temperature source Oral, resp. rate 16, height 5\' 4"  (1.626 m), weight 68 kg, SpO2 99 %.  Filed Weights   07/03/20 0949  Weight: 68 kg    Labs & Radiologic Studies    CBC Recent Labs    07/08/20 0200 07/09/20 0313  WBC 6.5 7.0  HGB 8.7* 9.1*  HCT 27.9* 29.1*  MCV 85.3 84.6  PLT 321 258   Basic  Metabolic Panel Recent Labs    07/07/20 0400 07/08/20 0200 07/09/20 0313  NA 134* 134* 135  K 4.4 4.4 4.5  CL 98 99 101  CO2 26 26 26   GLUCOSE 113* 93 98  BUN 17 14 18   CREATININE 1.11* 0.97 1.28*  CALCIUM 9.6 9.5 9.4  MG 2.2  --   --    Liver Function Tests No results for input(s): AST, ALT, ALKPHOS, BILITOT, PROT, ALBUMIN in the last 72 hours. No results for input(s): LIPASE, AMYLASE in the last 72 hours. High Sensitivity Troponin:   Recent Labs  Lab 07/03/20 1159 07/03/20 1449 07/03/20 1659 07/03/20 2006 07/04/20 0452  TROPONINIHS 64* 1,029* 2,520* 3,872* 3,160*    BNP Invalid input(s): POCBNP D-Dimer No results for input(s): DDIMER in the last 72 hours. Hemoglobin A1C No results for input(s): HGBA1C in the last 72 hours. Fasting Lipid Panel No results for input(s): CHOL, HDL, LDLCALC, TRIG, CHOLHDL, LDLDIRECT in the last 72 hours. Thyroid Function Tests No results for input(s): TSH, T4TOTAL, T3FREE, THYROIDAB in the last 72 hours.  Invalid input(s): FREET3 _____________  CARDIAC CATHETERIZATION  Result Date: 07/07/2020  Mid RCA lesion is 25% stenosed.  LV end diastolic pressure is normal.  There is no aortic valve stenosis.  Nonobstructive CAD.  Continue medical therapy.   DG Chest Port 1 View  Result Date: 07/03/2020 CLINICAL DATA:  Mid sternal chest pain radiating to the left arm beginning 1 hour ago. EXAM: PORTABLE CHEST 1 VIEW COMPARISON:  05/08/2020 and older studies. FINDINGS: Stable enlargement of the cardiopericardial silhouette. No mediastinal or hilar masses. Mild prominence of the interstitial lung markings and lung hyperexpansion stable from the prior exam. No evidence of pneumonia or pulmonary edema. No convincing pleural effusion or pneumothorax. Stable changes from prior left breast surgery. Skeletal structures are demineralized but grossly intact. IMPRESSION: 1. No acute cardiopulmonary disease. 2. Stable cardiomegaly. Electronically Signed   By:  Lajean Manes M.D.   On: 07/03/2020 10:17   ECHOCARDIOGRAM COMPLETE  Result Date: 07/04/2020    ECHOCARDIOGRAM REPORT   Patient Name:   [name] [facility] Date of Exam: 07/04/2020 Medical Rec #:  [id number]          Height:       64.0 in Accession #:    [id number]         Weight:       150.0 lb Date of Birth:  03/20/1933          BSA:          1.731 m Patient Age:    80 years           BP:           109/70 mmHg Patient Gender: F  HR:           73 bpm. Exam Location:  Inpatient Procedure: 2D Echo, 3D Echo, Cardiac Doppler and Color Doppler Indications:    R07.9* Chest pain, unspecified  History:        Patient has prior history of Echocardiogram examinations, most                 recent 01/07/2015. Abnormal ECG, Arrythmias:Atrial Fibrillation,                 Signs/Symptoms:Chest Pain and Shortness of Breath; Risk                 Factors:Current Smoker, Diabetes and Dyslipidemia.  Sonographer:    Roseanna Rainbow RDCS Referring Phys: [place]  Sonographer Comments: Suboptimal apical window. IMPRESSIONS  1. Left ventricular ejection fraction, by estimation, is 55 to 60%. The left ventricle has normal function. The left ventricle has no regional wall motion abnormalities. There is mild left ventricular hypertrophy. Left ventricular diastolic parameters are indeterminate.  2. Right ventricular systolic function is mildly reduced. The right ventricular size is severely enlarged. There is mildly elevated pulmonary artery systolic pressure.  3. Left atrial size was moderately dilated.  4. Right atrial size was severely dilated.  5. The mitral valve is normal in structure. Mild mitral valve regurgitation. No evidence of mitral stenosis.  6. Tricuspid valve regurgitation is severe.  7. The aortic valve is tricuspid. Aortic valve regurgitation is trivial. Mild aortic valve sclerosis is present, with no evidence of aortic valve stenosis.  8. Aortic dilatation noted. There is mild dilatation of the  ascending aorta, measuring 40 mm.  9. The inferior vena cava is dilated in size with <50% respiratory variability, suggesting right atrial pressure of 15 mmHg. FINDINGS  Left Ventricle: Left ventricular ejection fraction, by estimation, is 55 to 60%. The left ventricle has normal function. The left ventricle has no regional wall motion abnormalities. The left ventricular internal cavity size was normal in size. There is  mild left ventricular hypertrophy. Left ventricular diastolic parameters are indeterminate. Right Ventricle: The right ventricular size is severely enlarged. Right ventricular systolic function is mildly reduced. There is mildly elevated pulmonary artery systolic pressure. The tricuspid regurgitant velocity is 2.60 m/s, and with an assumed right atrial pressure of 15 mmHg, the estimated right ventricular systolic pressure is 62.2 mmHg. Left Atrium: Left atrial size was moderately dilated. Right Atrium: Right atrial size was severely dilated. Pericardium: There is no evidence of pericardial effusion. Mitral Valve: The mitral valve is normal in structure. Mild mitral valve regurgitation. No evidence of mitral valve stenosis. Tricuspid Valve: The tricuspid valve is normal in structure. Tricuspid valve regurgitation is severe. No evidence of tricuspid stenosis. Aortic Valve: The aortic valve is tricuspid. Aortic valve regurgitation is trivial. Mild aortic valve sclerosis is present, with no evidence of aortic valve stenosis. Pulmonic Valve: The pulmonic valve was normal in structure. Pulmonic valve regurgitation is trivial. No evidence of pulmonic stenosis. Aorta: Aortic dilatation noted. There is mild dilatation of the ascending aorta, measuring 40 mm. Venous: The inferior vena cava is dilated in size with less than 50% respiratory variability, suggesting right atrial pressure of 15 mmHg. IAS/Shunts: No atrial level shunt detected by color flow Doppler.  LEFT VENTRICLE PLAX 2D LVIDd:         4.80 cm  LVIDs:         3.50 cm LV PW:         1.40 cm  LV IVS:        1.10 cm LVOT diam:     1.90 cm LV SV:         53 LV SV Index:   30 LVOT Area:     2.84 cm  LV Volumes (MOD) LV vol d, MOD A2C: 98.8 ml LV vol d, MOD A4C: 66.0 ml LV vol s, MOD A2C: 42.4 ml LV vol s, MOD A4C: 33.5 ml LV SV MOD A2C:     56.4 ml LV SV MOD A4C:     66.0 ml LV SV MOD BP:      42.2 ml RIGHT VENTRICLE            IVC RV S prime:     8.49 cm/s  IVC diam: 2.80 cm TAPSE (M-mode): 2.0 cm LEFT ATRIUM             Index       RIGHT ATRIUM           Index LA diam:        4.40 cm 2.54 cm/m  RA Area:     42.50 cm LA Vol (A2C):   70.4 ml 40.66 ml/m RA Volume:   194.00 ml 112.06 ml/m LA Vol (A4C):   47.9 ml 27.67 ml/m LA Biplane Vol: 61.9 ml 35.76 ml/m  AORTIC VALVE             PULMONIC VALVE LVOT Vmax:   91.30 cm/s  PR End Diast Vel: 2.30 msec LVOT Vmean:  55.800 cm/s LVOT VTI:    0.186 m  AORTA Ao Root diam: 3.10 cm Ao Asc diam:  4.20 cm MITRAL VALVE                TRICUSPID VALVE MV Area (PHT): 5.84 cm     TR Peak grad:   27.0 mmHg MV Decel Time: 130 msec     TR Vmax:        260.00 cm/s MV E velocity: 103.00 cm/s                             SHUNTS                             Systemic VTI:  0.19 m                             Systemic Diam: 1.90 cm Kirk Ruths MD Electronically signed by Kirk Ruths MD Signature Date/Time: 07/04/2020/11:20:25 AM    Final    CT Angio Chest/Abd/Pel for Dissection W and/or W/WO  Result Date: 07/03/2020 CLINICAL DATA:  Mid sternal chest pain radiating to left arm beginning 1 hour prior to arrival. Some nausea and shortness of breath. EXAM: CT ANGIOGRAPHY CHEST, ABDOMEN AND PELVIS TECHNIQUE: Multidetector CT imaging through the chest, abdomen and pelvis was performed using the standard protocol during bolus administration of intravenous contrast. Multiplanar reconstructed images and MIPs were obtained and reviewed to evaluate the vascular anatomy. CONTRAST:  62mL OMNIPAQUE IOHEXOL 350 MG/ML SOLN COMPARISON:   03/08/2018. FINDINGS: CTA CHEST FINDINGS Cardiovascular: Mild enlargement of the heart, with a right heart enlargement predominance, mostly the right atrium. This is similar to the prior CT. No pericardial effusion. Left and right coronary artery calcifications/atherosclerosis. Great vessels are normal in caliber. No aortic dissection. No significant atherosclerosis. Aortic arch branch vessels are  widely patent. Mediastinum/Nodes: No neck base, mediastinal or hilar masses. Several prominent shotty lymph nodes, pre-vascular and AP window, all subcentimeter, presumed reactive. Trachea and esophagus are unremarkable. Lungs/Pleura: Bilateral interstitial thickening. Mosaic pattern of attenuation. No lung consolidation. No mass or suspicious nodule. No pleural effusion or pneumothorax. Musculoskeletal: No fracture or acute finding.  No bone lesion. Review of the MIP images confirms the above findings. CTA ABDOMEN AND PELVIS FINDINGS VASCULAR Aorta: Normal in caliber.  Mild atherosclerosis.  No dissection. Celiac: Mild atherosclerotic plaque at the origin. No significant stenosis. No dissection. SMA: Patent without evidence of aneurysm, dissection, vasculitis or significant stenosis. Renals: Plaque at the origin of the left renal artery with narrowing estimated at 60%. Plaque at the origin of the right renal artery with no significant narrowing. IMA: At least mild narrowing at the origin due to atherosclerotic plaque. Vessel is patent. Inflow: Mild atherosclerotic plaque.  No narrowing or dissection. Veins: No obvious venous abnormality within the limitations of this arterial phase study. Review of the MIP images confirms the above findings. NON-VASCULAR Hepatobiliary: No focal liver abnormality is seen. Status post cholecystectomy. No biliary dilatation. Pancreas: Unremarkable. No pancreatic ductal dilatation or surrounding inflammatory changes. Spleen: Normal in size without focal abnormality. Adrenals/Urinary Tract: No  adrenal masses. Bilateral renal cysts. No stones. No hydronephrosis. Normal ureters. Normal bladder. Stomach/Bowel: Small hiatal hernia. Stomach otherwise unremarkable. Small bowel and colon are normal in caliber. No wall thickening. No inflammation. Numerous colonic diverticula mostly on the left. No diverticulitis. Normal appendix visualized. Lymphatic: No enlarged lymph nodes. Reproductive: Uterus and bilateral adnexa are unremarkable. Other: No abdominal wall hernia or abnormality. No abdominopelvic ascites. Musculoskeletal: No fracture or acute finding.  No bone lesion. Review of the MIP images confirms the above findings. IMPRESSION: CTA FINDINGS 1. No aortic dissection. No aneurysm. Mild atherosclerosis. No significant stenosis. 2. Atherosclerotic disease at the origin aortic branch vessels. Approximately 60% narrowing at the origin of the left renal artery and at least mild narrowing at the origin of the inferior mesenteric artery. No other evidence of a hemodynamically significant stenosis. NON CTA FINDINGS 1. No acute findings within the chest, abdomen or pelvis. 2. Cardiomegaly and left and right coronary artery atherosclerotic calcifications. 3. Lungs show chronic interstitial thickening. Mosaic attenuation is noted suggesting small airways disease. 4. Numerous colonic diverticula without evidence of diverticulitis. Electronically Signed   By: Lajean Manes M.D.   On: 07/03/2020 12:02   Disposition   Pt is being discharged home today in good condition.  Follow-up Plans & Appointments     Discharge Instructions    Call MD for:  persistant dizziness or light-headedness   Complete by: As directed    Call MD for:  redness, tenderness, or signs of infection (pain, swelling, redness, odor or green/yellow discharge around incision site)   Complete by: As directed    Diet - low sodium heart healthy   Complete by: As directed    Increase activity slowly   Complete by: As directed        Discharge Medications   Allergies as of 07/09/2020      Reactions   Ferumoxytol Itching   Relieved with IV benadryl   Chocolate Other (See Comments)   Cough, sweating   Lisinopril Other (See Comments)   Cough, sweating   Peanuts [peanut Oil] Cough      Medication List    STOP taking these medications   metoprolol succinate 100 MG 24 hr tablet Commonly known as: TOPROL-XL  simvastatin 10 MG tablet Commonly known as: ZOCOR     TAKE these medications   amLODipine 10 MG tablet Commonly known as: NORVASC Take 10 mg by mouth.   atorvastatin 40 MG tablet Commonly known as: LIPITOR Take 1 tablet (40 mg total) by mouth daily. Start taking on: July 10, 2020   Flovent HFA 110 MCG/ACT inhaler Generic drug: fluticasone TAKE 2 PUFFS BY MOUTH TWICE A DAY What changed: See the new instructions.   gabapentin 100 MG capsule Commonly known as: NEURONTIN Take 100-200 mg by mouth at bedtime.   metoprolol tartrate 25 MG tablet Commonly known as: LOPRESSOR Take 1 tablet (25 mg total) by mouth 2 (two) times daily.   omeprazole 20 MG capsule Commonly known as: PRILOSEC Take 1 capsule (20 mg total) by mouth daily.   Rivaroxaban 15 MG Tabs tablet Commonly known as: XARELTO Take 1 tablet (15 mg total) by mouth daily with supper. What changed:   medication strength  how much to take  when to take this   Vitamin D 50 MCG (2000 UT) tablet Take 2,000 Units by mouth daily.         Outstanding Labs/Studies   N/a   Duration of Discharge Encounter   Greater than 30 minutes including physician time.  Signed, Reino Bellis, NP 07/09/2020, 10:51 AM   I have personally seen and examined this patient. I agree with the assessment and plan as outlined above. Doing well this am. No chest pain or dyspnea. Will d/c home today on lower dose of Toprol given her 2 second pauses.   Lauree Chandler 07/09/2020 10:51 AM

## 2020-07-08 NOTE — Progress Notes (Addendum)
Progress Note  Patient Name: [name] Date of Encounter: 07/08/2020  [facility] Cardiologist: Sherren Mocha, MD   Subjective   No complaints this morning. Initially planned for DC today but HR now not well controlled in the setting of BB being held. No chest pain.   Inpatient Medications    Scheduled Meds: . amLODipine  10 mg Oral Daily  . aspirin EC  81 mg Oral Daily  . atorvastatin  40 mg Oral Daily  . budesonide  0.25 mg Nebulization BID  . feeding supplement  237 mL Oral TID BM  . ferrous [id number] C-folic acid  1 capsule Oral BID PC  . gabapentin  100 mg Oral QHS  . metoprolol tartrate  25 mg Oral BID  . multivitamin with minerals  1 tablet Oral Daily  . pantoprazole  40 mg Oral Daily  . rivaroxaban  15 mg Oral Q supper  . sodium chloride flush  3 mL Intravenous Q12H  . sodium chloride flush  3 mL Intravenous Q12H   Continuous Infusions: . sodium chloride     PRN Meds: sodium chloride, acetaminophen, nitroGLYCERIN, ondansetron (ZOFRAN) IV, sodium chloride flush   Vital Signs    Vitals:   07/07/20 2142 07/08/20 0614 07/08/20 0808 07/08/20 0837  BP:  (!) 103/50  115/65  Pulse:  63  75  Resp: 16 15  16   Temp:  98.2 F (36.8 C) 98.9 F (37.2 C) 98.9 F (37.2 C)  TempSrc:  Oral Oral Oral  SpO2:  100% 96% 100%  Weight:      Height:        Intake/Output Summary (Last 24 hours) at 07/08/2020 1007 Last data filed at 07/07/2020 2200 Gross per 24 hour  Intake 1002.39 ml  Output --  Net 1002.39 ml   Last 3 Weights 07/03/2020 05/08/2020 03/07/2020  Weight (lbs) 150 lb 149 lb 150 lb  Weight (kg) 68.04 kg 67.586 kg 68.04 kg      Telemetry    Afib,Overnight rates in the 80s, but elevated in the 110-130s range this morning - Personally Reviewed  ECG    Afib, 72 bpm with TWI in lead III, v5-v6 - Personally Reviewed  Physical Exam   GEN: No acute distress.   Neck: No JVD Cardiac: Irreg Irreg, + systolic murmurs, no rubs, or gallops.   Respiratory: Clear to auscultation bilaterally. GI: Soft, nontender, non-distended  MS: No edema; No deformity. Neuro:  Nonfocal  Psych: Normal affect   Labs    High Sensitivity Troponin:   Recent Labs  Lab 07/03/20 1159 07/03/20 1449 07/03/20 1659 07/03/20 2006 07/04/20 0452  TROPONINIHS 64* 1,029* 2,520* 3,872* 3,160*      Chemistry Recent Labs  Lab 07/05/20 0149 07/06/20 0223 07/07/20 0400 07/08/20 0200  NA 135 134* 134* 134*  K 4.4 4.1 4.4 4.4  CL 103 99 98 99  CO2 26 27 26 26   GLUCOSE 100* 116* 113* 93  BUN 13 14 17 14   CREATININE 0.96 1.00 1.11* 0.97  CALCIUM 9.4 9.6 9.6 9.5  PROT 7.2  --   --   --   ALBUMIN 3.4*  --   --   --   AST 25  --   --   --   ALT 9  --   --   --   ALKPHOS 52  --   --   --   BILITOT 0.6  --   --   --   GFRNONAA 58* 56* 49* 58*  ANIONGAP 6 8 10 9      Hematology Recent Labs  Lab 07/06/20 0223 07/07/20 0400 07/08/20 0200  WBC 6.9 6.4 6.5  RBC 3.12* 3.23* 3.27*  HGB 8.2* 8.6* 8.7*  HCT 26.5* 27.4* 27.9*  MCV 84.9 84.8 85.3  MCH 26.3 26.6 26.6  MCHC 30.9 31.4 31.2  RDW 14.8 14.8 14.8  PLT 322 313 321    BNPNo results for input(s): BNP, PROBNP in the last 168 hours.   DDimer No results for input(s): DDIMER in the last 168 hours.   Radiology    CARDIAC CATHETERIZATION  Result Date: 07/07/2020  Mid RCA lesion is 25% stenosed.  LV end diastolic pressure is normal.  There is no aortic valve stenosis.  Nonobstructive CAD.  Continue medical therapy.    Cardiac Studies   Echo: 07/04/20  IMPRESSIONS    1. Left ventricular ejection fraction, by estimation, is 55 to 60%. The  left ventricle has normal function. The left ventricle has no regional  wall motion abnormalities. There is mild left ventricular hypertrophy.  Left ventricular diastolic parameters  are indeterminate.  2. Right ventricular systolic function is mildly reduced. The right  ventricular size is severely enlarged. There is mildly elevated  pulmonary  artery systolic pressure.  3. Left atrial size was moderately dilated.  4. Right atrial size was severely dilated.  5. The mitral valve is normal in structure. Mild mitral valve  regurgitation. No evidence of mitral stenosis.  6. Tricuspid valve regurgitation is severe.  7. The aortic valve is tricuspid. Aortic valve regurgitation is trivial.  Mild aortic valve sclerosis is present, with no evidence of aortic valve  stenosis.  8. Aortic dilatation noted. There is mild dilatation of the ascending  aorta, measuring 40 mm.  9. The inferior vena cava is dilated in size with <50% respiratory  variability, suggesting right atrial pressure of 15 mmHg.   FINDINGS  Left Ventricle: Left ventricular ejection fraction, by estimation, is 55  to 60%. The left ventricle has normal function. The left ventricle has no  regional wall motion abnormalities. The left ventricular internal cavity  size was normal in size. There is  mild left ventricular hypertrophy. Left ventricular diastolic parameters  are indeterminate.   Right Ventricle: The right ventricular size is severely enlarged. Right  ventricular systolic function is mildly reduced. There is mildly elevated  pulmonary artery systolic pressure. The tricuspid regurgitant velocity is  2.60 m/s, and with an assumed  right atrial pressure of 15 mmHg, the estimated right ventricular systolic  pressure is 35.5 mmHg.   Left Atrium: Left atrial size was moderately dilated.   Right Atrium: Right atrial size was severely dilated.   Pericardium: There is no evidence of pericardial effusion.   Mitral Valve: The mitral valve is normal in structure. Mild mitral valve  regurgitation. No evidence of mitral valve stenosis.   Tricuspid Valve: The tricuspid valve is normal in structure. Tricuspid  valve regurgitation is severe. No evidence of tricuspid stenosis.   Aortic Valve: The aortic valve is tricuspid. Aortic valve regurgitation  is  trivial. Mild aortic valve sclerosis is present, with no evidence of  aortic valve stenosis.   Pulmonic Valve: The pulmonic valve was normal in structure. Pulmonic valve  regurgitation is trivial. No evidence of pulmonic stenosis.   Aorta: Aortic dilatation noted. There is mild dilatation of the ascending  aorta, measuring 40 mm.   Venous: The inferior vena cava is dilated in size with less than 50%  respiratory variability,  suggesting right atrial pressure of 15 mmHg.   IAS/Shunts: No atrial level shunt detected by color flow Doppler.   Cath: 07/07/20   Mid RCA lesion is 25% stenosed.  LV end diastolic pressure is normal.  There is no aortic valve stenosis.  Nonobstructive CAD. Continue medical therapy.    Patient Profile     87 y.o. female  here with unstable angina, longstanding persistent atrial fibrillation, hypertension with chronic kidney disease stage IIIa, hyperlipidemiawho presented with NSTEMI.  Assessment & Plan    1. NSTEMI: Elevated troponin in setting of chest pain.CTA of chest showed no evidence of aortic syndrome or dissection.EKG with inferolateral T wave inversions.hsTn peaked at 3872. She was treated with IV heparin and underwent cardiac cath that showed mild non-obstructive CAD. Recommendations for continued medical therapy. No further episodes of chest pain.  -- planned to have her ambulate in the hallway but now tachycardic -- will resume metoprolol at 25mg  BID, follow rates  2.Persistantatrial fibrillation:Had been well rate controlled but now elevated in the 110-130s. Xarelto resumed post cath.   -- as above plan to resume metoprolol 25mg  BID  3. Chronic anemia:Hgb dipped into the mid 8 range while receiving IVFs and heparin. Reports she did have episodes of BRBPR the week prior to admission, but noted on the toilet paper. No blood in stool or urine.  -- Iron 25, T sat 7 --attempted IV iron but developed itching/allergic reaction  therefore was DC'ed.  -- will add PO iron   4. HLD:on Zocor PTA ( but reports she wasn't taking). LDL 85 -- now on atorva 40mg  daily  5. Hypomag: 1.7>>1.9>>2.2 -- resolved with supplementation  6. HTN: on norvasc 10mg   -- Bps have been borderline soft, but tolerating meds  7. Sinus Pauses:mostly <2 secs, asymptomatic. As a result her Toprol was stopped -- with RVR this morning will attempt to add back metoprolol 25mg  BID  For questions or updates, please contact [facility] Please consult [email or web address] for contact info under     Signed, Reino Bellis, NP  07/08/2020, 10:07 AM    I have personally seen and examined this patient. I agree with the assessment and plan as outlined above.  Cardiac cath yesterday with minimal CAD. Heart rates up overnight. H/H stable.  Will monitor today given elevated heart rate. Will have to be careful with her beta blocker given pauses that have been noted over the past few days.  Likely discharge home tomorrow.   Lauree Chandler 07/08/2020 10:28 AM

## 2020-07-09 ENCOUNTER — Other Ambulatory Visit (HOSPITAL_COMMUNITY): Payer: Self-pay

## 2020-07-09 DIAGNOSIS — R079 Chest pain, unspecified: Secondary | ICD-10-CM

## 2020-07-09 DIAGNOSIS — E440 Moderate protein-calorie malnutrition: Secondary | ICD-10-CM | POA: Insufficient documentation

## 2020-07-09 LAB — CBC
HCT: 29.1 % — ABNORMAL LOW (ref 36.0–46.0)
Hemoglobin: 9.1 g/dL — ABNORMAL LOW (ref 12.0–15.0)
MCH: 26.5 pg (ref 26.0–34.0)
MCHC: 31.3 g/dL (ref 30.0–36.0)
MCV: 84.6 fL (ref 80.0–100.0)
Platelets: 332 10*3/uL (ref 150–400)
RBC: 3.44 MIL/uL — ABNORMAL LOW (ref 3.87–5.11)
RDW: 15 % (ref 11.5–15.5)
WBC: 7 10*3/uL (ref 4.0–10.5)
nRBC: 0 % (ref 0.0–0.2)

## 2020-07-09 LAB — BASIC METABOLIC PANEL
Anion gap: 8 (ref 5–15)
BUN: 18 mg/dL (ref 8–23)
CO2: 26 mmol/L (ref 22–32)
Calcium: 9.4 mg/dL (ref 8.9–10.3)
Chloride: 101 mmol/L (ref 98–111)
Creatinine, Ser: 1.28 mg/dL — ABNORMAL HIGH (ref 0.44–1.00)
GFR, Estimated: 41 mL/min — ABNORMAL LOW (ref >60)
Glucose, Bld: 98 mg/dL (ref 70–99)
Potassium: 4.5 mmol/L (ref 3.5–5.1)
Sodium: 135 mmol/L (ref 135–145)

## 2020-07-09 MED ORDER — RIVAROXABAN 15 MG PO TABS
15.0000 mg | ORAL_TABLET | Freq: Every day | ORAL | 1 refills | Status: DC
  Filled 2020-07-09: qty 30, 30d supply, fill #0

## 2020-07-09 MED ORDER — METOPROLOL TARTRATE 25 MG PO TABS
25.0000 mg | ORAL_TABLET | Freq: Two times a day (BID) | ORAL | 1 refills | Status: DC
  Filled 2020-07-09: qty 60, 30d supply, fill #0

## 2020-07-09 MED ORDER — ATORVASTATIN CALCIUM 40 MG PO TABS
40.0000 mg | ORAL_TABLET | Freq: Every day | ORAL | 1 refills | Status: DC
  Filled 2020-07-09: qty 30, 30d supply, fill #0

## 2020-07-09 NOTE — Care Management Important Message (Signed)
Important Message  Patient Details  Name: [name] MRN: [id number] Date of Birth: Jun 11, 1933   Medicare Important Message Given:  Yes     Shelda Altes 07/09/2020, 10:13 AM

## 2020-07-14 MED FILL — Fentanyl Citrate Preservative Free (PF) Inj 100 MCG/2ML: INTRAMUSCULAR | Qty: 1 | Status: AC

## 2020-07-15 DIAGNOSIS — I10 Essential (primary) hypertension: Secondary | ICD-10-CM | POA: Insufficient documentation

## 2020-07-20 ENCOUNTER — Other Ambulatory Visit (HOSPITAL_COMMUNITY): Payer: Self-pay

## 2020-07-20 ENCOUNTER — Telehealth (HOSPITAL_COMMUNITY): Payer: Self-pay

## 2020-07-20 NOTE — Telephone Encounter (Signed)
Transitions of Care Pharmacy   Call attempted for a pharmacy transitions of care follow-up. Voicemail box is full.  Call attempt #2. Will follow-up in 2-3 days.

## 2020-07-21 ENCOUNTER — Telehealth (HOSPITAL_COMMUNITY): Payer: Self-pay | Admitting: Pharmacist

## 2020-07-21 NOTE — Telephone Encounter (Signed)
Unable to reach patient, third and final attempt

## 2020-07-27 ENCOUNTER — Other Ambulatory Visit (HOSPITAL_COMMUNITY): Payer: Self-pay

## 2020-07-27 ENCOUNTER — Encounter: Payer: Self-pay | Admitting: Orthopaedic Surgery

## 2020-07-27 ENCOUNTER — Ambulatory Visit (INDEPENDENT_AMBULATORY_CARE_PROVIDER_SITE_OTHER): Payer: Medicare Other | Admitting: Orthopaedic Surgery

## 2020-07-27 ENCOUNTER — Other Ambulatory Visit: Payer: Self-pay

## 2020-07-27 VITALS — Ht 64.0 in | Wt 150.0 lb

## 2020-07-27 DIAGNOSIS — M542 Cervicalgia: Secondary | ICD-10-CM | POA: Diagnosis not present

## 2020-07-27 DIAGNOSIS — I200 Unstable angina: Secondary | ICD-10-CM | POA: Diagnosis not present

## 2020-07-27 MED ORDER — PREDNISONE 5 MG (21) PO TBPK
ORAL_TABLET | ORAL | 0 refills | Status: DC
Start: 2020-07-27 — End: 2020-08-12

## 2020-07-27 NOTE — Progress Notes (Signed)
Office Visit Note   Patient: [name]           Date of Birth: 1934/01/25           MRN: [id number] Visit Date: 07/27/2020              Requested by: Elisabeth Cara, [address] [place],  [place] [address] PCP: Belva Bertin, [place], [place]   Assessment & Plan: Visit Diagnoses:  1. Cervicalgia     Plan: Impression is recurrent cervical spine pain and radiculopathy left upper extremity.  We have discussed starting her on a steroid taper in addition to a course of physical therapy.  She is amenable to both.  She will follow-up with [place] as needed.  Follow-Up Instructions: Return if symptoms worsen or fail to improve.   Orders:  Orders Placed This Encounter  Procedures   Ambulatory referral to Physical Therapy    Meds ordered this encounter  Medications   predniSONE (STERAPRED UNI-PAK 21 TAB) 5 MG (21) TBPK tablet    Sig: Take as directed    Dispense:  21 tablet    Refill:  0       Procedures: No procedures performed   Clinical Data: No additional findings.   Subjective: Chief Complaint  Patient presents with   Left Shoulder - Follow-up   Neck - Pain    HPI patient is a pleasant 87 year old female who comes in today with recurrent left lateral neck and shoulder pain.  She was here for the same issue about a year and a half ago.  She was provided with a steroid pack at that time.  She is unsure whether it helped but notes that her symptoms recently restarted.  The pain is primarily to the lateral neck and top of the shoulder and radiates into the parascapular region.  Any movement of the neck seems to make this worse.  She has been using heating pad and taking Tylenol without significant relief.  She does note occasional tingling to the left upper extremity and into the hand.  Review of Systems as detailed in HPI.  All others reviewed and are negative.   Objective: Vital Signs: Ht 5\' 4"  (1.626 m)   Wt 150 lb (68 kg)   BMI 25.75  kg/m   Physical Exam well-developed well-nourished female no acute distress.  Alert and oriented x3.  Ortho Exam examination of the cervical spine reveals no spinous tenderness.  Shows some mild left-sided paraspinous tenderness and into the parascapular region.  Increased pain with cervical spine flexion, extension and rotation.  No focal weakness.  She is neurovascular intact distally.  Specialty Comments:  No specialty comments available.  Imaging: X-rays reviewed by me in [place] from last year reveal moderate multilevel degenerative changes of the cervical spine   PMFS History: Patient Active Problem List   Diagnosis Date Noted   Malnutrition of moderate degree 07/09/2020   Chest pain of uncertain etiology    Unstable angina ([place]) 07/03/2020   Primary osteoarthritis, right ankle and foot 01/24/2018   Arthritis of right ankle 08/21/2017   Allergic urticaria 09/07/2016   Generalized pruritus 09/07/2016   Moderate persistent asthma 09/07/2016   Perennial and seasonal allergic rhinitis 09/07/2016   Chest pain 01/06/2015   Angina at rest [facility]) 01/06/2015   Diabetes ([place]) 01/06/2015   A-fib ([place]) 01/06/2015   HLD (hyperlipidemia) 01/06/2015   Tobacco abuse 01/06/2015   Diabetes mellitus with complication (HCC)    Painful  total knee replacement ([place]) 01/28/2013   Atrial fibrillation (HCC)    Left shoulder pain    Hypertension    Past Medical History:  Diagnosis Date   Arthritis    "all over"   Atrial fibrillation [facility])    Diagnosed ~2009   Breast cancer, left breast ([place]) 1998   S/P chemo and mastectomy    GERD (gastroesophageal reflux disease)    Hyperlipidemia    Hypertension    Left shoulder pain    "MRI showed pinched nerve" - per pt   OSA on CPAP    Pneumonia    "4-5 times" (01/06/2015)   Type II diabetes mellitus ([place])     Family History  Problem Relation Age of Onset   Heart attack Mother        48s   Emphysema Brother    Allergic rhinitis Neg Hx     Angioedema Neg Hx    Asthma Neg Hx    Eczema Neg Hx    Immunodeficiency Neg Hx    Urticaria Neg Hx     Past Surgical History:  Procedure Laterality Date   BREAST BIOPSY Left 1998   CATARACT EXTRACTION W/ INTRAOCULAR LENS  IMPLANT, BILATERAL Bilateral    JOINT REPLACEMENT     LAPAROSCOPIC CHOLECYSTECTOMY     LEFT HEART CATH AND CORONARY ANGIOGRAPHY N/A 07/07/2020   Procedure: LEFT HEART CATH AND CORONARY ANGIOGRAPHY;  Surgeon: Jettie Booze, MD;  Location: [place] [facility];  Service: Cardiovascular;  Laterality: N/A;   MASTECTOMY Left 1998   TOTAL KNEE ARTHROPLASTY Left 2001   TOTAL KNEE REVISION Left 01/28/2013   Procedure: LEFT TOTAL KNEE ARTHROPLASTY REVISION;  Surgeon: Marianna Payment, MD;  Location: [place] [facility];  Service: Orthopedics;  Laterality: Left;   Social History   Occupational History   Not on file  Tobacco Use   Smoking status: Never   Smokeless tobacco: Current    Types: Snuff  Vaping Use   Vaping Use: Never used  Substance and Sexual Activity   Alcohol use: Not Currently    Comment: 01/06/2015 "I'll have a beer q once in awhile"   Drug use: No   Sexual activity: Never

## 2020-07-31 ENCOUNTER — Emergency Department (HOSPITAL_BASED_OUTPATIENT_CLINIC_OR_DEPARTMENT_OTHER): Payer: Medicare Other

## 2020-07-31 ENCOUNTER — Emergency Department (HOSPITAL_BASED_OUTPATIENT_CLINIC_OR_DEPARTMENT_OTHER)
Admission: EM | Admit: 2020-07-31 | Discharge: 2020-08-01 | Disposition: A | Discharge status: Home / Self Care | Payer: Medicare Other | Attending: Emergency Medicine | Admitting: Emergency Medicine

## 2020-07-31 ENCOUNTER — Other Ambulatory Visit: Payer: Self-pay

## 2020-07-31 ENCOUNTER — Encounter (HOSPITAL_BASED_OUTPATIENT_CLINIC_OR_DEPARTMENT_OTHER): Payer: Self-pay | Admitting: Emergency Medicine

## 2020-07-31 DIAGNOSIS — I2510 Atherosclerotic heart disease of native coronary artery without angina pectoris: Secondary | ICD-10-CM | POA: Insufficient documentation

## 2020-07-31 DIAGNOSIS — R0789 Other chest pain: Secondary | ICD-10-CM

## 2020-07-31 DIAGNOSIS — I499 Cardiac arrhythmia, unspecified: Secondary | ICD-10-CM | POA: Insufficient documentation

## 2020-07-31 DIAGNOSIS — I4891 Unspecified atrial fibrillation: Secondary | ICD-10-CM | POA: Insufficient documentation

## 2020-07-31 DIAGNOSIS — Z91010 Allergy to peanuts: Secondary | ICD-10-CM | POA: Insufficient documentation

## 2020-07-31 DIAGNOSIS — Z853 Personal history of malignant neoplasm of breast: Secondary | ICD-10-CM | POA: Insufficient documentation

## 2020-07-31 DIAGNOSIS — R600 Localized edema: Secondary | ICD-10-CM | POA: Diagnosis not present

## 2020-07-31 DIAGNOSIS — E119 Type 2 diabetes mellitus without complications: Secondary | ICD-10-CM | POA: Diagnosis not present

## 2020-07-31 DIAGNOSIS — Z7901 Long term (current) use of anticoagulants: Secondary | ICD-10-CM | POA: Diagnosis not present

## 2020-07-31 DIAGNOSIS — E8779 Other fluid overload: Secondary | ICD-10-CM

## 2020-07-31 DIAGNOSIS — F17200 Nicotine dependence, unspecified, uncomplicated: Secondary | ICD-10-CM | POA: Diagnosis not present

## 2020-07-31 DIAGNOSIS — Z79899 Other long term (current) drug therapy: Secondary | ICD-10-CM | POA: Insufficient documentation

## 2020-07-31 DIAGNOSIS — J4540 Moderate persistent asthma, uncomplicated: Secondary | ICD-10-CM | POA: Insufficient documentation

## 2020-07-31 DIAGNOSIS — N289 Disorder of kidney and ureter, unspecified: Secondary | ICD-10-CM

## 2020-07-31 DIAGNOSIS — Z96652 Presence of left artificial knee joint: Secondary | ICD-10-CM | POA: Insufficient documentation

## 2020-07-31 DIAGNOSIS — R079 Chest pain, unspecified: Secondary | ICD-10-CM | POA: Diagnosis present

## 2020-07-31 DIAGNOSIS — I10 Essential (primary) hypertension: Secondary | ICD-10-CM | POA: Insufficient documentation

## 2020-07-31 DIAGNOSIS — R1310 Dysphagia, unspecified: Secondary | ICD-10-CM

## 2020-07-31 LAB — CBC WITH DIFFERENTIAL/PLATELET
Abs Immature Granulocytes: 0.04 10*3/uL (ref 0.00–0.07)
Basophils Absolute: 0 10*3/uL (ref 0.0–0.1)
Basophils Relative: 0 %
Eosinophils Absolute: 0 10*3/uL (ref 0.0–0.5)
Eosinophils Relative: 0 %
HCT: 27.9 % — ABNORMAL LOW (ref 36.0–46.0)
Hemoglobin: 9.1 g/dL — ABNORMAL LOW (ref 12.0–15.0)
Immature Granulocytes: 0 %
Lymphocytes Relative: 7 %
Lymphs Abs: 0.8 10*3/uL (ref 0.7–4.0)
MCH: 27.5 pg (ref 26.0–34.0)
MCHC: 32.6 g/dL (ref 30.0–36.0)
MCV: 84.3 fL (ref 80.0–100.0)
Monocytes Absolute: 0.6 10*3/uL (ref 0.1–1.0)
Monocytes Relative: 6 %
Neutro Abs: 9.4 10*3/uL — ABNORMAL HIGH (ref 1.7–7.7)
Neutrophils Relative %: 87 %
Platelets: 299 10*3/uL (ref 150–400)
RBC: 3.31 MIL/uL — ABNORMAL LOW (ref 3.87–5.11)
RDW: 16.9 % — ABNORMAL HIGH (ref 11.5–15.5)
WBC: 10.8 10*3/uL — ABNORMAL HIGH (ref 4.0–10.5)
nRBC: 0 % (ref 0.0–0.2)

## 2020-07-31 LAB — BASIC METABOLIC PANEL
Anion gap: 10 (ref 5–15)
BUN: 52 mg/dL — ABNORMAL HIGH (ref 8–23)
CO2: 21 mmol/L — ABNORMAL LOW (ref 22–32)
Calcium: 9.5 mg/dL (ref 8.9–10.3)
Chloride: 101 mmol/L (ref 98–111)
Creatinine, Ser: 1.97 mg/dL — ABNORMAL HIGH (ref 0.44–1.00)
GFR, Estimated: 25 mL/min — ABNORMAL LOW (ref >60)
Glucose, Bld: 121 mg/dL — ABNORMAL HIGH (ref 70–99)
Potassium: 3.9 mmol/L (ref 3.5–5.1)
Sodium: 132 mmol/L — ABNORMAL LOW (ref 135–145)

## 2020-07-31 LAB — BRAIN NATRIURETIC PEPTIDE: B Natriuretic Peptide: 930.1 pg/mL — ABNORMAL HIGH (ref 0.0–100.0)

## 2020-07-31 LAB — TROPONIN I (HIGH SENSITIVITY): Troponin I (High Sensitivity): 15 ng/L (ref <18)

## 2020-07-31 MED ORDER — FUROSEMIDE 10 MG/ML IJ SOLN: 40.0000 mg | INTRAMUSCULAR | Status: DC

## 2020-07-31 MED ORDER — FUROSEMIDE 20 MG PO TABS: 20.0000 mg | ORAL_TABLET | Freq: Every day | ORAL | 0 refills | Status: DC

## 2020-07-31 MED ORDER — FUROSEMIDE 10 MG/ML IJ SOLN
20.0000 mg | INTRAMUSCULAR | Status: AC
  Administered 2020-07-31: 20 mg via INTRAVENOUS
  Filled 2020-07-31: qty 2

## 2020-07-31 MED ORDER — OMEPRAZOLE 20 MG PO CPDR: 40.0000 mg | DELAYED_RELEASE_CAPSULE | Freq: Every day | ORAL | 0 refills | Status: DC

## 2020-07-31 NOTE — ED Triage Notes (Signed)
Reports she had a heart attack a few weeks ago.  Reports having pain across chest and in both shoulders on and off since being in the hospital.  Reports she is currently taking prednisone and the pain is worse every she takes it and every time she eats.

## 2020-07-31 NOTE — ED Provider Notes (Signed)
[place] [facility] EMERGENCY DEPARTMENT Provider Note   CSN: [id number] Arrival date & time: 07/31/20  2158     History Chief Complaint  Patient presents with   Chest Pain    [name] is a 87 y.o. female.  87 year old female with past medical history below including CAD, atrial fibrillation on anticoagulation, breast cancer, hypertension, hyperlipidemia, OSA, type 2 diabetes mellitus who presents with chest pain.  Patient was admitted 5/28 through 6/3 for NSTEMI and had cardiac catheterization with no stenting.  She states that ever since leaving the hospital, she has had intermittent pain that was initially in her left posterior shoulder but now goes across her upper back at base of neck to the right shoulder as well.  Pain comes and goes randomly is not associated with exertion.  Patient also notes that recently she has been having pain in her upper chest when she swallows and tries to eat food.  She notices that some food seems like it gets hung up and is more difficult to swallow.  She denies any vomiting.  She is currently on a PPI.  She has never seen a gastroenterologist. She has noticed some swelling in her ankles lately; R ankle is always bigger than left due to previous injury. She reports some windedness with exertion, no SOB at rest and no orthopnea. She is compliant w/ medications.  The history is provided by the patient.  Chest Pain     Past Medical History:  Diagnosis Date   Arthritis    "all over"   Atrial fibrillation [facility])    Diagnosed ~2009   Breast cancer, left breast ([place]) 1998   S/P chemo and mastectomy    GERD (gastroesophageal reflux disease)    Hyperlipidemia    Hypertension    Left shoulder pain    "MRI showed pinched nerve" - per pt   OSA on CPAP    Pneumonia    "4-5 times" (01/06/2015)   Type II diabetes mellitus ([place])     Patient Active Problem List   Diagnosis Date Noted   Malnutrition of moderate degree 07/09/2020   Chest pain  of uncertain etiology    Unstable angina ([place]) 07/03/2020   Primary osteoarthritis, right ankle and foot 01/24/2018   Arthritis of right ankle 08/21/2017   Allergic urticaria 09/07/2016   Generalized pruritus 09/07/2016   Moderate persistent asthma 09/07/2016   Perennial and seasonal allergic rhinitis 09/07/2016   Chest pain 01/06/2015   Angina at rest [facility]) 01/06/2015   Diabetes ([place]) 01/06/2015   A-fib ([place]) 01/06/2015   HLD (hyperlipidemia) 01/06/2015   Tobacco abuse 01/06/2015   Diabetes mellitus with complication ([place])    Painful total knee replacement ([place]) 01/28/2013   Atrial fibrillation ([facility])    Left shoulder pain    Hypertension     Past Surgical History:  Procedure Laterality Date   BREAST BIOPSY Left 1998   CATARACT EXTRACTION W/ INTRAOCULAR LENS  IMPLANT, BILATERAL Bilateral    JOINT REPLACEMENT     LAPAROSCOPIC CHOLECYSTECTOMY     LEFT HEART CATH AND CORONARY ANGIOGRAPHY N/A 07/07/2020   Procedure: LEFT HEART CATH AND CORONARY ANGIOGRAPHY;  Surgeon: Jettie Booze, MD;  Location: [place] CV LAB;  Service: Cardiovascular;  Laterality: N/A;   MASTECTOMY Left 1998   TOTAL KNEE ARTHROPLASTY Left 2001   TOTAL KNEE REVISION Left 01/28/2013   Procedure: LEFT TOTAL KNEE ARTHROPLASTY REVISION;  Surgeon: Marianna Payment, MD;  Location: [place];  Service: Orthopedics;  Laterality:  Left;     OB History   No obstetric history on file.     Family History  Problem Relation Age of Onset   Heart attack Mother        53s   Emphysema Brother    Allergic rhinitis Neg Hx    Angioedema Neg Hx    Asthma Neg Hx    Eczema Neg Hx    Immunodeficiency Neg Hx    Urticaria Neg Hx     Social History   Tobacco Use   Smoking status: Never   Smokeless tobacco: Current    Types: Snuff  Vaping Use   Vaping Use: Never used  Substance Use Topics   Alcohol use: Not Currently    Comment: 01/06/2015 "I'll have a beer q once in awhile"   Drug use: No    Home  Medications Prior to Admission medications   Medication Sig Start Date End Date Taking? Authorizing Provider  furosemide (LASIX) 20 MG tablet Take 1 tablet (20 mg total) by mouth daily for 5 days. 07/31/20 08/05/20 Yes Pedro Whiters, Wenda Overland, MD  amLODipine (NORVASC) 10 MG tablet Take 10 mg by mouth. 08/10/16   [provider]  atorvastatin (LIPITOR) 40 MG tablet Take 1 tablet (40 mg total) by mouth daily. 07/10/20   Cheryln Manly, NP  Cholecalciferol (VITAMIN D) 2000 units tablet Take 2,000 Units by mouth daily.    [provider]  FLOVENT HFA 110 MCG/ACT inhaler TAKE 2 PUFFS BY MOUTH TWICE A DAY Patient taking differently: Inhale 2 puffs into the lungs in the morning and at bedtime. 03/21/17   Bobbitt, [place] Muta, MD  gabapentin (NEURONTIN) 100 MG capsule Take 100-200 mg by mouth at bedtime. 06/22/20   [provider]  metoprolol tartrate (LOPRESSOR) 25 MG tablet Take 1 tablet (25 mg total) by mouth 2 (two) times daily. 07/09/20   Cheryln Manly, NP  omeprazole (PRILOSEC) 20 MG capsule Take 2 capsules (40 mg total) by mouth daily for 14 days. 07/31/20 08/14/20  Keishawn Darsey, Wenda Overland, MD  predniSONE (STERAPRED UNI-PAK 21 TAB) 5 MG (21) TBPK tablet Take as directed 07/27/20   Aundra Dubin, PA-C  Rivaroxaban (XARELTO) 15 MG TABS tablet Take 1 tablet (15 mg total) by mouth daily with supper. 07/09/20   Cheryln Manly, NP    Allergies    Ferumoxytol, Chocolate, Lisinopril, and Peanuts [peanut oil]  Review of Systems   Review of Systems  Cardiovascular:  Positive for chest pain.  All other systems reviewed and are negative except that which was mentioned in HPI  Physical Exam Updated Vital Signs BP (!) 141/82 (BP Location: Right Arm)   Pulse 86   Temp 98.1 F (36.7 C) (Oral)   Resp 20   Ht 5\' 4"  (1.626 m)   Wt 63.5 kg   SpO2 93%   BMI 24.03 kg/m   Physical Exam Constitutional:      General: She is not in acute distress.    Appearance: Normal  appearance.  HENT:     Head: Normocephalic and atraumatic.  Eyes:     Conjunctiva/sclera: Conjunctivae normal.  Cardiovascular:     Rate and Rhythm: Normal rate. Rhythm irregular.     Heart sounds: Normal heart sounds. No murmur heard. Pulmonary:     Effort: Pulmonary effort is normal.     Breath sounds: Normal breath sounds.  Abdominal:     General: Abdomen is flat. Bowel sounds are normal. There is no distension.  Palpations: Abdomen is soft.     Tenderness: There is no abdominal tenderness.  Musculoskeletal:     Right lower leg: Edema present.     Left lower leg: Edema present.  Skin:    General: Skin is warm and dry.  Neurological:     Mental Status: She is alert and oriented to person, place, and time.     Comments: fluent  Psychiatric:        Mood and Affect: Mood normal.        Behavior: Behavior normal.    ED Results / Procedures / Treatments   Labs (all labs ordered are listed, but only abnormal results are displayed) Labs Reviewed  BASIC METABOLIC PANEL - Abnormal; Notable for the following components:      Result Value   Sodium 132 (*)    CO2 21 (*)    Glucose, Bld 121 (*)    BUN 52 (*)    Creatinine, Ser 1.97 (*)    GFR, Estimated 25 (*)    All other components within normal limits  CBC WITH DIFFERENTIAL/PLATELET - Abnormal; Notable for the following components:   WBC 10.8 (*)    RBC 3.31 (*)    Hemoglobin 9.1 (*)    HCT 27.9 (*)    RDW 16.9 (*)    Neutro Abs 9.4 (*)    All other components within normal limits  BRAIN NATRIURETIC PEPTIDE - Abnormal; Notable for the following components:   B Natriuretic Peptide 930.1 (*)    All other components within normal limits  TROPONIN I (HIGH SENSITIVITY)  TROPONIN I (HIGH SENSITIVITY)    EKG EKG Interpretation  Date/Time:  Saturday July 31 2020 22:06:04 EDT Ventricular Rate:  88 PR Interval:    QRS Duration: 97 QT Interval:  412 QTC Calculation: 499 R Axis:   125 Text Interpretation: Atrial  fibrillation Right axis deviation Borderline low voltage, extremity leads Probable anteroseptal infarct, old No significant change since last tracing Confirmed by Theotis Burrow [phone]) on 07/31/2020 10:09:45 PM  Radiology No results found.  Procedures Procedures   Medications Ordered in ED Medications  furosemide (LASIX) injection 20 mg (20 mg Intravenous Given 07/31/20 2356)    ED Course  I have reviewed the triage vital signs and the nursing notes.  Pertinent labs & imaging results that were available during my care of the patient were reviewed by me and considered in my medical decision making (see chart for details).    MDM Rules/Calculators/A&P                          Vital signs reassuring on arrival, EKG showing atrial fibrillation similar to previous.  No ischemic changes.  Of note, patient was at orthopedic office a few days ago for cervicalgia and described very similar pain there; she was started on steroids and given referral to PT. her description of pain in 1 shoulder to the other and across neck sounds musculoskeletal in etiology and is not suggestive of ACS.  Regarding her other pain, it sounds highly suggestive of GI/esophageal process as patient has the pain when she swallows. DDx includes esophagitis, esophageal spasm, stricture, or GERD. I have recommended she increase omeprazole to 40mg  daily and have referred to GI for evaluation.   Lab work notable for creatinine of 1.97 which is increased from previous near 1.5.  BNP elevated at 930.  Troponin normal.  WBC 10.8, stable hemoglobin compared to previous.  Patient notes that  she was taken off Lasix for unknown reasons and she feels like she is retaining fluid with ankle swelling.  I have given IV Lasix with plan to give short course of oral Lasix for gentle diuresis.  She is in the mid 90s on room air and does not appear to have any respiratory compromise from her mild volume overload.  Because of the patient's cardiac  history and intermittent symptoms, will obtain a second troponin.  Also awaiting chest x-ray results.  Patient signed out to oncoming provider pending completion of labs and imaging. Final Clinical Impression(s) / ED Diagnoses Final diagnoses:  None    Rx / DC Orders ED Discharge Orders          Ordered    furosemide (LASIX) 20 MG tablet  Daily        07/31/20 2325    omeprazole (PRILOSEC) 20 MG capsule  Daily        07/31/20 2326             Jameika Kinn, Wenda Overland, MD 08/01/20 0001

## 2020-07-31 NOTE — ED Provider Notes (Signed)
Patient with atypical chest pain pending chest x-ray and delta troponin.  Also, signs of heart failure present with worsening in BUN and creatinine.  Plan to send home with prescription for furosemide and close follow-up with cardiology.  Repeat troponin is unchanged.  Chest x-ray is read by radiologist as showing atelectasis or infiltrate in the right mid and lower lungs.  I reviewed the images and feel that the changes are related to heart failure and not infection.  She has no symptoms to suggest pneumonia.  She is discharged with prescription for furosemide, increased dose of omeprazole, follow-up with cardiologist.  Renal function will need to be followed closely.  Results for orders placed or performed during the hospital encounter of 16/10/96  Basic metabolic panel  Result Value Ref Range   Sodium 132 (L) 135 - 145 mmol/L   Potassium 3.9 3.5 - 5.1 mmol/L   Chloride 101 98 - 111 mmol/L   CO2 21 (L) 22 - 32 mmol/L   Glucose, Bld 121 (H) 70 - 99 mg/dL   BUN 52 (H) 8 - 23 mg/dL   Creatinine, Ser 1.97 (H) 0.44 - 1.00 mg/dL   Calcium 9.5 8.9 - 10.3 mg/dL   GFR, Estimated 25 (L) >60 mL/min   Anion gap 10 5 - 15  CBC with Differential  Result Value Ref Range   WBC 10.8 (H) 4.0 - 10.5 K/uL   RBC 3.31 (L) 3.87 - 5.11 MIL/uL   Hemoglobin 9.1 (L) 12.0 - 15.0 g/dL   HCT 27.9 (L) 36.0 - 46.0 %   MCV 84.3 80.0 - 100.0 fL   MCH 27.5 26.0 - 34.0 pg   MCHC 32.6 30.0 - 36.0 g/dL   RDW 16.9 (H) 11.5 - 15.5 %   Platelets 299 150 - 400 K/uL   nRBC 0.0 0.0 - 0.2 %   Neutrophils Relative % 87 %   Neutro Abs 9.4 (H) 1.7 - 7.7 K/uL   Lymphocytes Relative 7 %   Lymphs Abs 0.8 0.7 - 4.0 K/uL   Monocytes Relative 6 %   Monocytes Absolute 0.6 0.1 - 1.0 K/uL   Eosinophils Relative 0 %   Eosinophils Absolute 0.0 0.0 - 0.5 K/uL   Basophils Relative 0 %   Basophils Absolute 0.0 0.0 - 0.1 K/uL   Immature Granulocytes 0 %   Abs Immature Granulocytes 0.04 0.00 - 0.07 K/uL  Brain natriuretic peptide   Result Value Ref Range   B Natriuretic Peptide 930.1 (H) 0.0 - 100.0 pg/mL  Troponin I (High Sensitivity)  Result Value Ref Range   Troponin I (High Sensitivity) 15 <18 ng/L  Troponin I (High Sensitivity)  Result Value Ref Range   Troponin I (High Sensitivity) 14 <18 ng/L   DG Chest 2 View  Result Date: 08/01/2020 CLINICAL DATA:  Chest and bilateral shoulder pain. EXAM: CHEST - 2 VIEW COMPARISON:  Jul 03, 2020 FINDINGS: The lungs are hyperinflated. Mild diffuse, chronic appearing increased lung markings are noted. Mild areas of atelectasis and/or infiltrate are seen within the mid right lung and left lung base. There is no evidence of a pleural effusion or pneumothorax. Radiopaque surgical clips are seen overlying the left axilla. The cardiac silhouette is moderately enlarged and unchanged in size. No acute osseous abnormalities are seen. IMPRESSION: Stable cardiomegaly with mild atelectasis and/or infiltrate within the mid right lung and left lung base. Electronically Signed   By: Virgina Norfolk M.D.   On: 08/01/2020 00:59   CARDIAC CATHETERIZATION  Result Date: 07/07/2020  Mid RCA lesion is 25% stenosed.  LV end diastolic pressure is normal.  There is no aortic valve stenosis.  Nonobstructive CAD.  Continue medical therapy.   DG Chest Port 1 View  Result Date: 07/03/2020 CLINICAL DATA:  Mid sternal chest pain radiating to the left arm beginning 1 hour ago. EXAM: PORTABLE CHEST 1 VIEW COMPARISON:  05/08/2020 and older studies. FINDINGS: Stable enlargement of the cardiopericardial silhouette. No mediastinal or hilar masses. Mild prominence of the interstitial lung markings and lung hyperexpansion stable from the prior exam. No evidence of pneumonia or pulmonary edema. No convincing pleural effusion or pneumothorax. Stable changes from prior left breast surgery. Skeletal structures are demineralized but grossly intact. IMPRESSION: 1. No acute cardiopulmonary disease. 2. Stable cardiomegaly.  Electronically Signed   By: Lajean Manes M.D.   On: 07/03/2020 10:17   ECHOCARDIOGRAM COMPLETE  Result Date: 07/04/2020    ECHOCARDIOGRAM REPORT   Patient Name:   [name] [facility] Date of Exam: 07/04/2020 Medical Rec #:  [id number]          Height:       64.0 in Accession #:    [id number]         Weight:       150.0 lb Date of Birth:  1933-07-11          BSA:          1.731 m Patient Age:    87 years           BP:           109/70 mmHg Patient Gender: F                  HR:           73 bpm. Exam Location:  Inpatient Procedure: 2D Echo, 3D Echo, Cardiac Doppler and Color Doppler Indications:    R07.9* Chest pain, unspecified  History:        Patient has prior history of Echocardiogram examinations, most                 recent 01/07/2015. Abnormal ECG, Arrythmias:Atrial Fibrillation,                 Signs/Symptoms:Chest Pain and Shortness of Breath; Risk                 Factors:Current Smoker, Diabetes and Dyslipidemia.  Sonographer:    Roseanna Rainbow RDCS Referring Phys: [place]  Sonographer Comments: Suboptimal apical window. IMPRESSIONS  1. Left ventricular ejection fraction, by estimation, is 55 to 60%. The left ventricle has normal function. The left ventricle has no regional wall motion abnormalities. There is mild left ventricular hypertrophy. Left ventricular diastolic parameters are indeterminate.  2. Right ventricular systolic function is mildly reduced. The right ventricular size is severely enlarged. There is mildly elevated pulmonary artery systolic pressure.  3. Left atrial size was moderately dilated.  4. Right atrial size was severely dilated.  5. The mitral valve is normal in structure. Mild mitral valve regurgitation. No evidence of mitral stenosis.  6. Tricuspid valve regurgitation is severe.  7. The aortic valve is tricuspid. Aortic valve regurgitation is trivial. Mild aortic valve sclerosis is present, with no evidence of aortic valve stenosis.  8. Aortic dilatation noted. There is  mild dilatation of the ascending aorta, measuring 40 mm.  9. The inferior vena cava is dilated in size with <50% respiratory variability, suggesting right atrial pressure of 15 mmHg. FINDINGS  Left Ventricle:  Left ventricular ejection fraction, by estimation, is 55 to 60%. The left ventricle has normal function. The left ventricle has no regional wall motion abnormalities. The left ventricular internal cavity size was normal in size. There is  mild left ventricular hypertrophy. Left ventricular diastolic parameters are indeterminate. Right Ventricle: The right ventricular size is severely enlarged. Right ventricular systolic function is mildly reduced. There is mildly elevated pulmonary artery systolic pressure. The tricuspid regurgitant velocity is 2.60 m/s, and with an assumed right atrial pressure of 15 mmHg, the estimated right ventricular systolic pressure is 72.0 mmHg. Left Atrium: Left atrial size was moderately dilated. Right Atrium: Right atrial size was severely dilated. Pericardium: There is no evidence of pericardial effusion. Mitral Valve: The mitral valve is normal in structure. Mild mitral valve regurgitation. No evidence of mitral valve stenosis. Tricuspid Valve: The tricuspid valve is normal in structure. Tricuspid valve regurgitation is severe. No evidence of tricuspid stenosis. Aortic Valve: The aortic valve is tricuspid. Aortic valve regurgitation is trivial. Mild aortic valve sclerosis is present, with no evidence of aortic valve stenosis. Pulmonic Valve: The pulmonic valve was normal in structure. Pulmonic valve regurgitation is trivial. No evidence of pulmonic stenosis. Aorta: Aortic dilatation noted. There is mild dilatation of the ascending aorta, measuring 40 mm. Venous: The inferior vena cava is dilated in size with less than 50% respiratory variability, suggesting right atrial pressure of 15 mmHg. IAS/Shunts: No atrial level shunt detected by color flow Doppler.  LEFT VENTRICLE PLAX 2D  LVIDd:         4.80 cm LVIDs:         3.50 cm LV PW:         1.40 cm LV IVS:        1.10 cm LVOT diam:     1.90 cm LV SV:         53 LV SV Index:   30 LVOT Area:     2.84 cm  LV Volumes (MOD) LV vol d, MOD A2C: 98.8 ml LV vol d, MOD A4C: 66.0 ml LV vol s, MOD A2C: 42.4 ml LV vol s, MOD A4C: 33.5 ml LV SV MOD A2C:     56.4 ml LV SV MOD A4C:     66.0 ml LV SV MOD BP:      42.2 ml RIGHT VENTRICLE            IVC RV S prime:     8.49 cm/s  IVC diam: 2.80 cm TAPSE (M-mode): 2.0 cm LEFT ATRIUM             Index       RIGHT ATRIUM           Index LA diam:        4.40 cm 2.54 cm/m  RA Area:     42.50 cm LA Vol (A2C):   70.4 ml 40.66 ml/m RA Volume:   194.00 ml 112.06 ml/m LA Vol (A4C):   47.9 ml 27.67 ml/m LA Biplane Vol: 61.9 ml 35.76 ml/m  AORTIC VALVE             PULMONIC VALVE LVOT Vmax:   91.30 cm/s  PR End Diast Vel: 2.30 msec LVOT Vmean:  55.800 cm/s LVOT VTI:    0.186 m  AORTA Ao Root diam: 3.10 cm Ao Asc diam:  4.20 cm MITRAL VALVE                TRICUSPID VALVE MV Area (PHT): 5.84 cm  TR Peak grad:   27.0 mmHg MV Decel Time: 130 msec     TR Vmax:        260.00 cm/s MV E velocity: 103.00 cm/s                             SHUNTS                             Systemic VTI:  0.19 m                             Systemic Diam: 1.90 cm Kirk Ruths MD Electronically signed by Kirk Ruths MD Signature Date/Time: 07/04/2020/11:20:25 AM    Final    CT Angio Chest/Abd/Pel for Dissection W and/or W/WO  Result Date: 07/03/2020 CLINICAL DATA:  Mid sternal chest pain radiating to left arm beginning 1 hour prior to arrival. Some nausea and shortness of breath. EXAM: CT ANGIOGRAPHY CHEST, ABDOMEN AND PELVIS TECHNIQUE: Multidetector CT imaging through the chest, abdomen and pelvis was performed using the standard protocol during bolus administration of intravenous contrast. Multiplanar reconstructed images and MIPs were obtained and reviewed to evaluate the vascular anatomy. CONTRAST:  86mL OMNIPAQUE IOHEXOL 350 MG/ML  SOLN COMPARISON:  03/08/2018. FINDINGS: CTA CHEST FINDINGS Cardiovascular: Mild enlargement of the heart, with a right heart enlargement predominance, mostly the right atrium. This is similar to the prior CT. No pericardial effusion. Left and right coronary artery calcifications/atherosclerosis. Great vessels are normal in caliber. No aortic dissection. No significant atherosclerosis. Aortic arch branch vessels are widely patent. Mediastinum/Nodes: No neck base, mediastinal or hilar masses. Several prominent shotty lymph nodes, pre-vascular and AP window, all subcentimeter, presumed reactive. Trachea and esophagus are unremarkable. Lungs/Pleura: Bilateral interstitial thickening. Mosaic pattern of attenuation. No lung consolidation. No mass or suspicious nodule. No pleural effusion or pneumothorax. Musculoskeletal: No fracture or acute finding.  No bone lesion. Review of the MIP images confirms the above findings. CTA ABDOMEN AND PELVIS FINDINGS VASCULAR Aorta: Normal in caliber.  Mild atherosclerosis.  No dissection. Celiac: Mild atherosclerotic plaque at the origin. No significant stenosis. No dissection. SMA: Patent without evidence of aneurysm, dissection, vasculitis or significant stenosis. Renals: Plaque at the origin of the left renal artery with narrowing estimated at 60%. Plaque at the origin of the right renal artery with no significant narrowing. IMA: At least mild narrowing at the origin due to atherosclerotic plaque. Vessel is patent. Inflow: Mild atherosclerotic plaque.  No narrowing or dissection. Veins: No obvious venous abnormality within the limitations of this arterial phase study. Review of the MIP images confirms the above findings. NON-VASCULAR Hepatobiliary: No focal liver abnormality is seen. Status post cholecystectomy. No biliary dilatation. Pancreas: Unremarkable. No pancreatic ductal dilatation or surrounding inflammatory changes. Spleen: Normal in size without focal abnormality.  Adrenals/Urinary Tract: No adrenal masses. Bilateral renal cysts. No stones. No hydronephrosis. Normal ureters. Normal bladder. Stomach/Bowel: Small hiatal hernia. Stomach otherwise unremarkable. Small bowel and colon are normal in caliber. No wall thickening. No inflammation. Numerous colonic diverticula mostly on the left. No diverticulitis. Normal appendix visualized. Lymphatic: No enlarged lymph nodes. Reproductive: Uterus and bilateral adnexa are unremarkable. Other: No abdominal wall hernia or abnormality. No abdominopelvic ascites. Musculoskeletal: No fracture or acute finding.  No bone lesion. Review of the MIP images confirms the above findings. IMPRESSION: CTA FINDINGS 1. No aortic dissection. No aneurysm.  Mild atherosclerosis. No significant stenosis. 2. Atherosclerotic disease at the origin aortic branch vessels. Approximately 60% narrowing at the origin of the left renal artery and at least mild narrowing at the origin of the inferior mesenteric artery. No other evidence of a hemodynamically significant stenosis. NON CTA FINDINGS 1. No acute findings within the chest, abdomen or pelvis. 2. Cardiomegaly and left and right coronary artery atherosclerotic calcifications. 3. Lungs show chronic interstitial thickening. Mosaic attenuation is noted suggesting small airways disease. 4. Numerous colonic diverticula without evidence of diverticulitis. Electronically Signed   By: Lajean Manes M.D.   On: 07/03/2020 12:02    Images viewed by me.    Delora Fuel, MD 24/09/73 [phone]

## 2020-07-31 NOTE — ED Notes (Signed)
Attempted IV x2 right AC without success, second RN to attempt

## 2020-08-01 DIAGNOSIS — R079 Chest pain, unspecified: Secondary | ICD-10-CM | POA: Diagnosis not present

## 2020-08-01 LAB — TROPONIN I (HIGH SENSITIVITY): Troponin I (High Sensitivity): 14 ng/L (ref <18)

## 2020-08-01 MED ORDER — ACETAMINOPHEN 325 MG PO TABS
650.0000 mg | ORAL_TABLET | Freq: Once | ORAL | Status: AC
  Administered 2020-08-01: 650 mg via ORAL
  Filled 2020-08-01: qty 2

## 2020-08-12 ENCOUNTER — Other Ambulatory Visit: Payer: Self-pay | Admitting: Cardiology

## 2020-08-12 ENCOUNTER — Other Ambulatory Visit: Payer: Self-pay

## 2020-08-12 ENCOUNTER — Ambulatory Visit (INDEPENDENT_AMBULATORY_CARE_PROVIDER_SITE_OTHER): Payer: Medicare Other | Admitting: Family

## 2020-08-12 ENCOUNTER — Other Ambulatory Visit: Payer: Self-pay | Admitting: Family

## 2020-08-12 ENCOUNTER — Encounter (HOSPITAL_BASED_OUTPATIENT_CLINIC_OR_DEPARTMENT_OTHER): Payer: Self-pay | Admitting: Family

## 2020-08-12 VITALS — BP 124/68 | HR 128 | Ht 64.0 in | Wt 152.6 lb

## 2020-08-12 DIAGNOSIS — I5189 Other ill-defined heart diseases: Secondary | ICD-10-CM

## 2020-08-12 DIAGNOSIS — I2510 Atherosclerotic heart disease of native coronary artery without angina pectoris: Secondary | ICD-10-CM

## 2020-08-12 DIAGNOSIS — E785 Hyperlipidemia, unspecified: Secondary | ICD-10-CM

## 2020-08-12 DIAGNOSIS — I77810 Thoracic aortic ectasia: Secondary | ICD-10-CM

## 2020-08-12 DIAGNOSIS — I10 Essential (primary) hypertension: Secondary | ICD-10-CM

## 2020-08-12 DIAGNOSIS — I272 Pulmonary hypertension, unspecified: Secondary | ICD-10-CM

## 2020-08-12 MED ORDER — FUROSEMIDE 20 MG PO TABS: 20.0000 mg | ORAL_TABLET | Freq: Every day | ORAL | 2 refills | Status: DC

## 2020-08-12 NOTE — Progress Notes (Signed)
Office Visit    Patient Name: [name] Date of Encounter: 08/12/2020  PCP:  [place], [place], [place]  Cardiologist:  Sherren Mocha, MD  Advanced Practice Provider:  No care team member to display Electrophysiologist:  None    Chief Complaint    [name] is a 87 y.o. female with a hx of hypertension, longstanding persistent atrial fibrillation, nonobstructive coronary artery disease, CKD 3 presents today for hospital follow-up  Past Medical History    Past Medical History:  Diagnosis Date   Arthritis    "all over"   Atrial fibrillation [facility])    Diagnosed ~2009   Breast cancer, left breast ([place]) 1998   S/P chemo and mastectomy    GERD (gastroesophageal reflux disease)    Hyperlipidemia    Hypertension    Left shoulder pain    "MRI showed pinched nerve" - per pt   OSA on CPAP    Pneumonia    "4-5 times" (01/06/2015)   Type II diabetes mellitus ([place])    Past Surgical History:  Procedure Laterality Date   BREAST BIOPSY Left 1998   CATARACT EXTRACTION W/ INTRAOCULAR LENS  IMPLANT, BILATERAL Bilateral    JOINT REPLACEMENT     LAPAROSCOPIC CHOLECYSTECTOMY     LEFT HEART CATH AND CORONARY ANGIOGRAPHY N/A 07/07/2020   Procedure: LEFT HEART CATH AND CORONARY ANGIOGRAPHY;  Surgeon: Jettie Booze, MD;  Location: [facility];  Service: Cardiovascular;  Laterality: N/A;   MASTECTOMY Left 1998   TOTAL KNEE ARTHROPLASTY Left 2001   TOTAL KNEE REVISION Left 01/28/2013   Procedure: LEFT TOTAL KNEE ARTHROPLASTY REVISION;  Surgeon: Marianna Payment, MD;  Location: [place];  Service: Orthopedics;  Laterality: Left;    Allergies  Allergies  Allergen Reactions   Ferumoxytol Itching    Relieved with IV benadryl   Chocolate Other (See Comments)    Cough, sweating    Lisinopril Other (See Comments)    Cough, sweating   Peanuts [Peanut Oil] Cough    History of Present Illness    [name] is a  87 y.o. female with a hx of hypertension, longstanding persistent atrial fibrillation, nonobstructive coronary artery disease, CKD 3  last seen while hospitalized.  She was admitted 07/03/2020 with chest pain.  Her EKG showed subtle changes of possible inferior injury but did not meet STEMI criteria.  She underwent CT of the chest with no evidence of acute aortic syndrome nor dissection.  She was transferred to [facility] due to concern for need for cardiac catheterization and received fentanyl patch which resolved her pain.  At bedtime troponin peaked at 3872.  She was treated with IV heparin and underwent cardiac cath showing mild nonobstructive coronary artery disease.  She was recommended for medical therapy.  Echo 07/04/2020 with LVEF 55-60%, indeterminate diastolic parameters, RV SF mildly reduced, mildly elevated PASP, severely enlarged right ventricle, LA moderately dilated, RA severely dilated, mild MR, mild dilation of ascending aorta 40 mm.  She was discharged on p.o. iron due to anemia.  She does not tolerate IV iron due to itching/allergic reaction.  She was discharged on atorvastatin 40 mg daily.  Sinus pauses less than 3 seconds which were asymptomatic were noted on the monitor and her metoprolol was discontinued but due to subsequent elevated rates from metoprolol tartrate 25 mg twice daily was resumed.  ED visit 07/31/2020 for atypical chest pain.  Chest x-ray as read by radiologist  showed atelectasis or infiltrate in right and mid lower lungs.  ED provider felt the changes were related to heart failure.  She was discharged with prescription for furosemide as well as increased dose of omeprazole.  Also noted slightly worse renal function than her baseline.  She presents today for follow-up with her daughter.  We reviewed ED visit and recommendations in detail.  She was only provided a 5-day course of Lasix and did feel somewhat improved while taking it.  She notes persistent dyspnea on exertion  and some nonpitting lower extremity edema.  She does sit with her feet in a dependent position most of the time.  Denies chest pain, pressure, tightness.  Reports no orthopnea, PND.  Is overall sedentary throughout the day.   EKGs/Labs/Other Studies Reviewed:   The following studies were reviewed today: LHC 08/04/2020   Conclusion    Mid RCA lesion is 25% stenosed. LV end diastolic pressure is normal. There is no aortic valve stenosis.   Nonobstructive CAD.  Continue medical therapy.    Recommendations  Antiplatelet/Anticoag Recommend to resume Rivaroxaban, at currently prescribed dose and frequency on 07/08/2020. Concurrent antiplatelet therapy not recommended.   Coronary Diagrams   Diagnostic Dominance: Right        Echo 07/04/2020 1. Left ventricular ejection fraction, by estimation, is 55 to 60%. The  left ventricle has normal function. The left ventricle has no regional  wall motion abnormalities. There is mild left ventricular hypertrophy.  Left ventricular diastolic parameters  are indeterminate.   2. Right ventricular systolic function is mildly reduced. The right  ventricular size is severely enlarged. There is mildly elevated pulmonary  artery systolic pressure.   3. Left atrial size was moderately dilated.   4. Right atrial size was severely dilated.   5. The mitral valve is normal in structure. Mild mitral valve  regurgitation. No evidence of mitral stenosis.   6. Tricuspid valve regurgitation is severe.   7. The aortic valve is tricuspid. Aortic valve regurgitation is trivial.  Mild aortic valve sclerosis is present, with no evidence of aortic valve  stenosis.   8. Aortic dilatation noted. There is mild dilatation of the ascending  aorta, measuring 40 mm.   9. The inferior vena cava is dilated in size with <50% respirat ory  variability, suggesting right atrial pressure of 15 mmHg.   EKG:  EKG is  ordered today.  The ekg ordered today demonstrates  atrial fibrillation 117 - 128 bpm. PVc's. No acute ST/T wave changes.   Recent Labs: 07/05/2020: ALT 9 08/04/20: Magnesium 2.2 07/31/2020: B Natriuretic Peptide 930.1; BUN 52; Creatinine, Ser 1.97; Hemoglobin 9.1; Platelets 299; Potassium 3.9; Sodium 132  Recent Lipid Panel    Component Value Date/Time   CHOL 148 07/04/2020 0341   TRIG 52 07/04/2020 0341   HDL 53 07/04/2020 0341   CHOLHDL 2.8 07/04/2020 0341   VLDL 10 07/04/2020 0341   LDLCALC 85 07/04/2020 0341    Home Medications   Current Meds  Medication Sig   amLODipine (NORVASC) 10 MG tablet Take 10 mg by mouth.   atorvastatin (LIPITOR) 40 MG tablet Take 1 tablet (40 mg total) by mouth daily.   Cholecalciferol (VITAMIN D) 2000 units tablet Take 2,000 Units by mouth daily.   FLOVENT HFA 110 MCG/ACT inhaler TAKE 2 PUFFS BY MOUTH TWICE A DAY (Patient taking differently: Inhale 2 puffs into the lungs in the morning and at bedtime.)   gabapentin (NEURONTIN) 100 MG capsule Take 100-200 mg by mouth  at bedtime.   metoprolol tartrate (LOPRESSOR) 25 MG tablet Take 1 tablet (25 mg total) by mouth 2 (two) times daily.   omeprazole (PRILOSEC) 20 MG capsule Take 2 capsules (40 mg total) by mouth daily for 14 days.   Rivaroxaban (XARELTO) 15 MG TABS tablet Take 1 tablet (15 mg total) by mouth daily with supper.   [DISCONTINUED] predniSONE (STERAPRED UNI-PAK 21 TAB) 5 MG (21) TBPK tablet Take as directed    Review of Systems      All other systems reviewed and are otherwise negative except as noted above.  Physical Exam    VS:  BP 124/68   Pulse (!) 128   Ht 5\' 4"  (1.626 m)   Wt 152 lb 9.6 oz (69.2 kg)   SpO2 92%   BMI 26.19 kg/m  , BMI Body mass index is 26.19 kg/m.  Wt Readings from Last 3 Encounters:  08/12/20 152 lb 9.6 oz (69.2 kg)  07/31/20 140 lb (63.5 kg)  07/27/20 150 lb (68 kg)    GEN: Well nourished, well developed, in no acute distress. HEENT: normal. Neck: Supple, no JVD, carotid bruits, or masses. Cardiac:  Irregularly irregular, no murmurs, rubs, or gallops. No clubbing, cyanosis.  Nonpitting lower extremity edema..  Radials/PT 2+ and equal bilaterally.  Respiratory:  Respirations regular and unlabored, clear to auscultation bilaterally. GI: Soft, nontender, nondistended. MS: No deformity or atrophy. Skin: Warm and dry, no rash. Neuro:  Strength and sensation are intact. Psych: Normal affect.  Assessment & Plan    CAD-  Stable with no anginal symptoms. No indication for ischemic evaluation. GDMT includes metoprolol, atorvastatin. No aspirin due to chronic anticoagulation. Heart healthy diet and regular cardiovascular exercise encouraged.    Persistent atrial fibrillation/chronic anticoagulation/diastolic dysfunction/ mild pulmonary hypertension-heart rate mildly elevated today in the setting of volume overload.  Previously had sinus pause with higher doses of metoprolol.  Will defer increased dose of metoprolol at this time but could consider at follow-up if does not improve with reduced fluid volume.  Encouraged to monitor heart rate at home.  Continue Xarelto 15 mg daily due to CHA2DS2-VASc of at least 5 ( agex2, gender, HTN, CAD).  Denies bleeding complications.  Given dyspnea and volume overload start Lasix 20 mg daily.  BMP, BNP today.  BMP in 2 weeks for monitoring.  Hyperlipidemia-Started on atorvastatin 80 mg daily during recent hospitalization.  Will need repeat lipid panel/LFT early August.   Hypertension- BP well controlled. Continue current antihypertensive regimen.    Mild aortic dilation -40 mm by echo 07/04/2020.  Continue optimal BP and volume control.  Consider repeat echo June 2023.  Sinus pause -noted during recent admission mostly less than 3 seconds in all asymptomatic.  Her Toprol was stopped but then developed rapid rate.  She was returned to metoprolol 25 mg twice daily.  Disposition: Follow up in 6 week(s) with Dr. Burt Knack or APP.  Signed, Loel Dubonnet, NP 08/12/2020,  1:55 PM [facility]

## 2020-08-12 NOTE — Patient Instructions (Addendum)
Medication Instructions:  Your physician has recommended you make the following change in your medication:   START Furosemide (Lasix) 20mg  daily  *If you need a refill on your cardiac medications before your next appointment, please call your pharmacy*   Lab Work: Your physician recommends lab work today: BMP, BNP  Your physician recommends that you return for lab work in 2 weeks for BMP   If you have labs (blood work) drawn today and your tests are completely normal, you will receive your results only by: [place] (if you have [place]) OR A paper copy in the mail If you have any lab test that is abnormal or we need to change your treatment, we will call you to review the results.   Testing/Procedures: Your EKG today shows atrial fibrillation.   Your cardiac catheterization in the hospital showed mild 25% blockage in one vessel but otherwise was good. Your Atorvastatin is to help prevent the 25% blockage from worsening.   Your echocardiogram in the hospital showed normal heart pumping function, but that your heart did not relax as well as it used to and mildly elevated pressure in your lungs. This can cause swelling and shortness of breath. We have started Lasix to help with this.   Follow-Up: At [facility], you and your health needs are our priority.  As part of our continuing mission to provide you with exceptional heart care, we have created designated Provider Care Teams.  These Care Teams include your primary Cardiologist (physician) and Advanced Practice Providers (APPs -  Physician Assistants and Nurse Practitioners) who all work together to provide you with the care you need, when you need it.  We recommend signing up for the patient portal called "MyChart".  Sign up information is provided on this After Visit Summary.  MyChart is used to connect with patients for Virtual Visits (Telemedicine).  Patients are able to view lab/test results, encounter notes, upcoming  appointments, etc.  Non-urgent messages can be sent to your provider as well.   To learn more about what you can do with MyChart, go to [email or web address].    Your next appointment:   6 week(s)  The format for your next appointment:   In Person  Provider:   You may see Sherren Mocha, MD or one of the following Advanced Practice Providers on your designated Care Team:   Richardson Dopp, PA-C [place], [place] Loel Dubonnet, NP   Other Instructions  Heart Healthy Diet Recommendations: A low-salt diet is recommended. Meats should be grilled, baked, or boiled. Avoid fried foods. Focus on lean protein sources like fish or chicken with vegetables and fruits. The American Heart Association is a Microbiologist!  American Heart Association Diet and Lifeystyle Recommendations   Exercise recommendations: The American Heart Association recommends 150 minutes of moderate intensity exercise weekly. Try 30 minutes of moderate intensity exercise 4-5 times per week. This could include walking, jogging, or swimming.  To prevent or reduce lower extremity swelling: Eat a low salt diet. Salt makes the body hold onto extra fluid which causes swelling. Sit with legs elevated. For example, in the recliner or on an [place].  Wear knee-high compression stockings during the daytime. Ones labeled 15-20 mmHg provide good compression.

## 2020-08-13 ENCOUNTER — Telehealth: Payer: Self-pay | Admitting: Family

## 2020-08-13 LAB — BASIC METABOLIC PANEL
BUN/Creatinine Ratio: 11 — ABNORMAL LOW (ref 12–28)
BUN: 13 mg/dL (ref 8–27)
CO2: 21 mmol/L (ref 20–29)
Calcium: 9 mg/dL (ref 8.7–10.3)
Chloride: 104 mmol/L (ref 96–106)
Creatinine, Ser: 1.17 mg/dL — ABNORMAL HIGH (ref 0.57–1.00)
Glucose: 101 mg/dL — ABNORMAL HIGH (ref 65–99)
Potassium: 3.9 mmol/L (ref 3.5–5.2)
Sodium: 139 mmol/L (ref 134–144)
eGFR: 46 mL/min/{1.73_m2} — ABNORMAL LOW (ref >59)

## 2020-08-13 MED ORDER — METOPROLOL TARTRATE 25 MG PO TABS: 25.0000 mg | ORAL_TABLET | Freq: Two times a day (BID) | ORAL | 1 refills | Status: DC

## 2020-08-13 NOTE — Telephone Encounter (Signed)
*  STAT* If patient is at the pharmacy, call can be transferred to refill team.   1. Which medications need to be refilled? (please list name of each medication and dose if known)  metoprolol tartrate (LOPRESSOR) 25 MG tablet  2. Which pharmacy/location (including street and city if local pharmacy) is medication to be sent to? [id number] [place], [address]  3. Do they need a 30 day or 90 day supply? 90 with refills    Patient has been out of medication since Tuesday

## 2020-08-13 NOTE — Telephone Encounter (Signed)
Refill for Metoprolol has been sent to [facility], per pt request.

## 2020-08-14 ENCOUNTER — Emergency Department (HOSPITAL_BASED_OUTPATIENT_CLINIC_OR_DEPARTMENT_OTHER)
Admission: EM | Admit: 2020-08-14 | Discharge: 2020-08-14 | Disposition: A | Discharge status: Home / Self Care | Payer: Medicare Other | Attending: Emergency Medicine | Admitting: Emergency Medicine

## 2020-08-14 ENCOUNTER — Other Ambulatory Visit: Payer: Self-pay

## 2020-08-14 ENCOUNTER — Emergency Department (HOSPITAL_BASED_OUTPATIENT_CLINIC_OR_DEPARTMENT_OTHER): Payer: Medicare Other

## 2020-08-14 ENCOUNTER — Encounter (HOSPITAL_BASED_OUTPATIENT_CLINIC_OR_DEPARTMENT_OTHER): Payer: Self-pay | Admitting: Emergency Medicine

## 2020-08-14 DIAGNOSIS — J4540 Moderate persistent asthma, uncomplicated: Secondary | ICD-10-CM | POA: Insufficient documentation

## 2020-08-14 DIAGNOSIS — F17220 Nicotine dependence, chewing tobacco, uncomplicated: Secondary | ICD-10-CM | POA: Insufficient documentation

## 2020-08-14 DIAGNOSIS — R0602 Shortness of breath: Secondary | ICD-10-CM | POA: Insufficient documentation

## 2020-08-14 DIAGNOSIS — E119 Type 2 diabetes mellitus without complications: Secondary | ICD-10-CM | POA: Diagnosis not present

## 2020-08-14 DIAGNOSIS — Z91010 Allergy to peanuts: Secondary | ICD-10-CM | POA: Insufficient documentation

## 2020-08-14 DIAGNOSIS — R600 Localized edema: Secondary | ICD-10-CM | POA: Diagnosis not present

## 2020-08-14 DIAGNOSIS — R059 Cough, unspecified: Secondary | ICD-10-CM | POA: Insufficient documentation

## 2020-08-14 DIAGNOSIS — I509 Heart failure, unspecified: Secondary | ICD-10-CM | POA: Diagnosis not present

## 2020-08-14 DIAGNOSIS — Z7901 Long term (current) use of anticoagulants: Secondary | ICD-10-CM | POA: Insufficient documentation

## 2020-08-14 DIAGNOSIS — I4891 Unspecified atrial fibrillation: Secondary | ICD-10-CM | POA: Diagnosis not present

## 2020-08-14 DIAGNOSIS — Z96652 Presence of left artificial knee joint: Secondary | ICD-10-CM | POA: Diagnosis not present

## 2020-08-14 DIAGNOSIS — I110 Hypertensive heart disease with heart failure: Secondary | ICD-10-CM | POA: Insufficient documentation

## 2020-08-14 DIAGNOSIS — Z20822 Contact with and (suspected) exposure to covid-19: Secondary | ICD-10-CM | POA: Diagnosis not present

## 2020-08-14 DIAGNOSIS — Z7951 Long term (current) use of inhaled steroids: Secondary | ICD-10-CM | POA: Diagnosis not present

## 2020-08-14 DIAGNOSIS — Z853 Personal history of malignant neoplasm of breast: Secondary | ICD-10-CM | POA: Diagnosis not present

## 2020-08-14 HISTORY — DX: Acute myocardial infarction, unspecified: I21.9

## 2020-08-14 HISTORY — DX: Heart failure, unspecified: I50.9

## 2020-08-14 LAB — CBC WITH DIFFERENTIAL/PLATELET
Abs Immature Granulocytes: 0.02 10*3/uL (ref 0.00–0.07)
Basophils Absolute: 0 10*3/uL (ref 0.0–0.1)
Basophils Relative: 0 %
Eosinophils Absolute: 0.1 10*3/uL (ref 0.0–0.5)
Eosinophils Relative: 2 %
HCT: 26.7 % — ABNORMAL LOW (ref 36.0–46.0)
Hemoglobin: 8.7 g/dL — ABNORMAL LOW (ref 12.0–15.0)
Immature Granulocytes: 0 %
Lymphocytes Relative: 16 %
Lymphs Abs: 1.2 10*3/uL (ref 0.7–4.0)
MCH: 27.4 pg (ref 26.0–34.0)
MCHC: 32.6 g/dL (ref 30.0–36.0)
MCV: 84 fL (ref 80.0–100.0)
Monocytes Absolute: 0.6 10*3/uL (ref 0.1–1.0)
Monocytes Relative: 8 %
Neutro Abs: 5.5 10*3/uL (ref 1.7–7.7)
Neutrophils Relative %: 74 %
Platelets: 245 10*3/uL (ref 150–400)
RBC: 3.18 MIL/uL — ABNORMAL LOW (ref 3.87–5.11)
RDW: 17.4 % — ABNORMAL HIGH (ref 11.5–15.5)
WBC: 7.5 10*3/uL (ref 4.0–10.5)
nRBC: 0 % (ref 0.0–0.2)

## 2020-08-14 LAB — COMPREHENSIVE METABOLIC PANEL
ALT: 10 U/L (ref 0–44)
AST: 20 U/L (ref 15–41)
Albumin: 4 g/dL (ref 3.5–5.0)
Alkaline Phosphatase: 61 U/L (ref 38–126)
Anion gap: 7 (ref 5–15)
BUN: 19 mg/dL (ref 8–23)
CO2: 23 mmol/L (ref 22–32)
Calcium: 8.7 mg/dL — ABNORMAL LOW (ref 8.9–10.3)
Chloride: 104 mmol/L (ref 98–111)
Creatinine, Ser: 1.14 mg/dL — ABNORMAL HIGH (ref 0.44–1.00)
GFR, Estimated: 47 mL/min — ABNORMAL LOW (ref >60)
Glucose, Bld: 103 mg/dL — ABNORMAL HIGH (ref 70–99)
Potassium: 4 mmol/L (ref 3.5–5.1)
Sodium: 134 mmol/L — ABNORMAL LOW (ref 135–145)
Total Bilirubin: 0.7 mg/dL (ref 0.3–1.2)
Total Protein: 7.4 g/dL (ref 6.5–8.1)

## 2020-08-14 LAB — RESP PANEL BY RT-PCR (FLU A&B, COVID) ARPGX2
Influenza A by PCR: NEGATIVE
Influenza B by PCR: NEGATIVE
SARS Coronavirus 2 by RT PCR: NEGATIVE

## 2020-08-14 LAB — BRAIN NATRIURETIC PEPTIDE: B Natriuretic Peptide: 466.5 pg/mL — ABNORMAL HIGH (ref 0.0–100.0)

## 2020-08-14 LAB — TROPONIN I (HIGH SENSITIVITY): Troponin I (High Sensitivity): 10 ng/L (ref <18)

## 2020-08-14 NOTE — ED Notes (Signed)
RT assessed patient. BBS diminished but clear. SAT 96%

## 2020-08-14 NOTE — ED Provider Notes (Signed)
[facility] Provider Note   CSN: [id number] Arrival date & time: 08/14/20  1434     History Chief Complaint  Patient presents with   Shortness of Breath    Janeil VAYLA WILHELMI is a 87 y.o. female with a history of HFpEF, atrial fibrillation, recent MI, presents with shortness of breath at home starting about 3 days ago worse when ambulating. Also endorses increased swelling. Endorses chronic cough worse in the past 3 days. Denies fever or any sick contacts. Denies orthopnea. Denies chest pain, palpitations, headache, change in vision. States she ran out of metoprolol on Tuesday and couldn't get a refill until yesterday.  Also states she has been out of lasix for about 2-3 weeks, and was able to get a refill on Thursday 2 days ago. States she had an MI 3 weeks ago and was in the hospital for a week.  Last saw cardiologist 2 days ago.    Past Medical History:  Diagnosis Date   Arthritis    "all over"   Atrial fibrillation ([place])    Diagnosed ~2009   Breast cancer, left breast ([place]) 02/07/1996   S/P chemo and mastectomy    CHF (congestive heart failure) ([place])    GERD (gastroesophageal reflux disease)    Hyperlipidemia    Hypertension    Left shoulder pain    "MRI showed pinched nerve" - per pt   MI (myocardial infarction) ([place])    OSA on CPAP    Pneumonia    "4-5 times" (01/06/2015)   Type II diabetes mellitus ([place])     Patient Active Problem List   Diagnosis Date Noted   Malnutrition of moderate degree 07/09/2020   Chest pain of uncertain etiology    Unstable angina ([place]) 07/03/2020   Primary osteoarthritis, right ankle and foot 01/24/2018   Arthritis of right ankle 08/21/2017   Allergic urticaria 09/07/2016   Generalized pruritus 09/07/2016   Moderate persistent asthma 09/07/2016   Perennial and seasonal allergic rhinitis 09/07/2016   Chest pain 01/06/2015   Angina at rest [facility]) 01/06/2015   Diabetes ([place]) 01/06/2015   A-fib ([place])  01/06/2015   HLD (hyperlipidemia) 01/06/2015   Tobacco abuse 01/06/2015   Diabetes mellitus with complication ([place])    Painful total knee replacement ([place]) 01/28/2013   Atrial fibrillation ([facility])    Left shoulder pain    Hypertension     Past Surgical History:  Procedure Laterality Date   BREAST BIOPSY Left 1998   CATARACT EXTRACTION W/ INTRAOCULAR LENS  IMPLANT, BILATERAL Bilateral    JOINT REPLACEMENT     LAPAROSCOPIC CHOLECYSTECTOMY     LEFT HEART CATH AND CORONARY ANGIOGRAPHY N/A 07/07/2020   Procedure: LEFT HEART CATH AND CORONARY ANGIOGRAPHY;  Surgeon: Jettie Booze, MD;  Location: [facility];  Service: Cardiovascular;  Laterality: N/A;   MASTECTOMY Left 1998   TOTAL KNEE ARTHROPLASTY Left 2001   TOTAL KNEE REVISION Left 01/28/2013   Procedure: LEFT TOTAL KNEE ARTHROPLASTY REVISION;  Surgeon: Marianna Payment, MD;  Location: [place];  Service: Orthopedics;  Laterality: Left;     OB History   No obstetric history on file.     Family History  Problem Relation Age of Onset   Heart attack Mother        110s   Emphysema Brother    Allergic rhinitis Neg Hx    Angioedema Neg Hx    Asthma Neg Hx    Eczema Neg Hx    Immunodeficiency  Neg Hx    Urticaria Neg Hx     Social History   Tobacco Use   Smoking status: Never   Smokeless tobacco: Current    Types: Snuff  Vaping Use   Vaping Use: Never used  Substance Use Topics   Alcohol use: Not Currently    Comment: 01/06/2015 "I'll have a beer q once in awhile"   Drug use: No    Home Medications Prior to Admission medications   Medication Sig Start Date End Date Taking? Authorizing Provider  amLODipine (NORVASC) 10 MG tablet Take 10 mg by mouth. 08/10/16   [provider]  atorvastatin (LIPITOR) 40 MG tablet Take 1 tablet (40 mg total) by mouth daily. 07/10/20   Cheryln Manly, NP  Cholecalciferol (VITAMIN D) 2000 units tablet Take 2,000 Units by mouth daily.    [provider]  FLOVENT  HFA 110 MCG/ACT inhaler TAKE 2 PUFFS BY MOUTH TWICE A DAY Patient taking differently: Inhale 2 puffs into the lungs in the morning and at bedtime. 03/21/17   Bobbitt, [place] Muta, MD  furosemide (LASIX) 20 MG tablet Take 1 tablet (20 mg total) by mouth daily. 08/12/20   Loel Dubonnet, NP  gabapentin (NEURONTIN) 100 MG capsule Take 100-200 mg by mouth at bedtime. 06/22/20   [provider]  metoprolol tartrate (LOPRESSOR) 25 MG tablet Take 1 tablet (25 mg total) by mouth 2 (two) times daily. 08/13/20   Loel Dubonnet, NP  omeprazole (PRILOSEC) 20 MG capsule Take 2 capsules (40 mg total) by mouth daily for 14 days. 07/31/20 08/14/20  Little, Wenda [place], MD  Rivaroxaban (XARELTO) 15 MG TABS tablet Take 1 tablet (15 mg total) by mouth daily with supper. 07/09/20   Cheryln Manly, NP    Allergies    Ferumoxytol, Chocolate, Lisinopril, and Peanuts [peanut oil]  Review of Systems   Review of Systems  Constitutional:  Negative for chills and fever.  Eyes:  Negative for visual disturbance.  Respiratory:  Positive for cough and shortness of breath.   Cardiovascular:  Negative for chest pain and palpitations.  Neurological:  Negative for dizziness and light-headedness.  All other systems reviewed and are negative.  Physical Exam Updated Vital Signs BP 128/76   Pulse (!) 151   Temp 98.4 F (36.9 C) (Oral)   Resp 13   Ht 5\' 4"  (1.626 m)   Wt 65.8 kg   SpO2 100%   BMI 24.89 kg/m   Physical Exam Constitutional:      General: She is not in acute distress. HENT:     Head: Normocephalic and atraumatic.  Eyes:     Extraocular Movements: Extraocular movements intact.  Cardiovascular:     Rate and Rhythm: Normal rate. Rhythm irregular.  Pulmonary:     Effort: Pulmonary effort is normal.     Comments: Mild crackles in lower lobes  Abdominal:     Palpations: Abdomen is soft.     Tenderness: There is no abdominal tenderness.  Musculoskeletal:     Cervical back: Normal range of  motion and neck supple.     Right lower leg: Edema present.     Left lower leg: Edema present.     Comments: 2+ pitting edema to mid calf  Significant edema in feet and ankles bilaterally   Skin:    General: Skin is warm and dry.  Neurological:     Mental Status: She is alert.    ED Results / Procedures / Treatments  Labs (all labs ordered are listed, but only abnormal results are displayed) Labs Reviewed - No data to display  EKG EKG Interpretation  Date/Time:  Saturday August 14 2020 14:46:06 EDT Ventricular Rate:  88 PR Interval:    QRS Duration: 82 QT Interval:  386 QTC Calculation: 467 R Axis:   131 Text Interpretation: Atrial fibrillation with premature ventricular or aberrantly conducted complexes Right axis deviation Low voltage QRS Septal infarct , age undetermined Abnormal ECG When compared to prior, more artifact and more PVC but similar Afib. No STEMI Confirmed by Antony Blackbird [phone]) on 08/14/2020 3:12:57 PM  Radiology No results found.  Procedures Procedures   Medications Ordered in ED Medications - No data to display  ED Course  I have reviewed the triage vital signs and the nursing notes.  Pertinent labs & imaging results that were available during my care of the patient were reviewed by me and considered in my medical decision making (see chart for details).    MDM Rules/Calculators/A&P                          LILLIANNE EICK is a 87 y.o. female with a history of HFpEF, atrial fibrillation, recent MI, presents with shortness of breath 3 days ago that is worse when ambulating. Endorses a worsening cough for the past 3 days as well and denies fever or sick contacts. On exam heart with normal rate and irregular rhythm. Lungs with mild crackles at bases. Suspect SOB is due to her heart failure exacerbation given she has not been taking her lasix for the past 2 weeks. In ED patient maintained good O2 sats and was not hypoxic when ambulating BNP, CMP and  CBC were unchanged from baseline. Troponin was negative. COVID negative. CXR showing cardiomegaly, question of early pulmonary edema. Patient was advised to follow up with her PCP next week and was given return precautions.   Final Clinical Impression(s) / ED Diagnoses Final diagnoses:  None    Rx / DC Orders ED Discharge Orders     None        Shary Key, DO 08/14/20 1859    Tegeler, Gwenyth Allegra, MD 08/14/20 2055

## 2020-08-14 NOTE — ED Triage Notes (Signed)
Pt arrives pov with c/o and shob and bilateral ankle swelling x 5 days. Pt denies CP, recent MI. Just had lasix refilled on Thurs after being our of rx for 2 weeks

## 2020-08-14 NOTE — ED Notes (Signed)
ED Provider at bedside. Resident MD

## 2020-08-14 NOTE — ED Notes (Signed)
ED Provider at bedside. 

## 2020-08-14 NOTE — Discharge Instructions (Addendum)
It was a pleasure taking care of you! You were seen for your shortness of breath worse when ambulating. Your vitals were stable, and your oxygen levels did not drop when walking. Your labs were unremarkable and unchanged from your baseline. Your chest X ray showed fluid In your lungs. Your cough is likely due to increased fluid. I recommend following up with your PCP next week. Return if you develop worsening shortness of breath and difficulty breathing

## 2020-08-14 NOTE — ED Notes (Signed)
Patient transported to X-ray 

## 2020-08-23 ENCOUNTER — Other Ambulatory Visit: Payer: Self-pay | Admitting: Gastroenterology

## 2020-08-23 ENCOUNTER — Other Ambulatory Visit: Payer: Self-pay | Admitting: Cardiology

## 2020-08-23 DIAGNOSIS — R1310 Dysphagia, unspecified: Secondary | ICD-10-CM

## 2020-08-27 ENCOUNTER — Ambulatory Visit
Admission: RE | Admit: 2020-08-27 | Discharge: 2020-08-27 | Disposition: A | Discharge status: Home / Self Care | Payer: Medicare Other | Source: Ambulatory Visit | Attending: Gastroenterology | Admitting: Gastroenterology

## 2020-08-27 ENCOUNTER — Other Ambulatory Visit: Payer: Self-pay

## 2020-08-27 DIAGNOSIS — R1310 Dysphagia, unspecified: Secondary | ICD-10-CM

## 2020-08-29 ENCOUNTER — Emergency Department (HOSPITAL_BASED_OUTPATIENT_CLINIC_OR_DEPARTMENT_OTHER): Payer: Medicare Other

## 2020-08-29 ENCOUNTER — Encounter (HOSPITAL_BASED_OUTPATIENT_CLINIC_OR_DEPARTMENT_OTHER): Payer: Self-pay

## 2020-08-29 ENCOUNTER — Other Ambulatory Visit: Payer: Self-pay

## 2020-08-29 ENCOUNTER — Emergency Department (HOSPITAL_BASED_OUTPATIENT_CLINIC_OR_DEPARTMENT_OTHER)
Admission: EM | Admit: 2020-08-29 | Discharge: 2020-08-29 | Disposition: A | Discharge status: Home / Self Care | Payer: Medicare Other | Attending: Emergency Medicine | Admitting: Emergency Medicine

## 2020-08-29 DIAGNOSIS — R0602 Shortness of breath: Secondary | ICD-10-CM | POA: Diagnosis not present

## 2020-08-29 DIAGNOSIS — S0990XA Unspecified injury of head, initial encounter: Secondary | ICD-10-CM | POA: Diagnosis present

## 2020-08-29 DIAGNOSIS — F17220 Nicotine dependence, chewing tobacco, uncomplicated: Secondary | ICD-10-CM | POA: Diagnosis not present

## 2020-08-29 DIAGNOSIS — E785 Hyperlipidemia, unspecified: Secondary | ICD-10-CM | POA: Diagnosis not present

## 2020-08-29 DIAGNOSIS — Z7901 Long term (current) use of anticoagulants: Secondary | ICD-10-CM | POA: Diagnosis not present

## 2020-08-29 DIAGNOSIS — K644 Residual hemorrhoidal skin tags: Secondary | ICD-10-CM

## 2020-08-29 DIAGNOSIS — Z79899 Other long term (current) drug therapy: Secondary | ICD-10-CM | POA: Diagnosis not present

## 2020-08-29 DIAGNOSIS — S0083XA Contusion of other part of head, initial encounter: Secondary | ICD-10-CM | POA: Insufficient documentation

## 2020-08-29 DIAGNOSIS — E876 Hypokalemia: Secondary | ICD-10-CM | POA: Diagnosis not present

## 2020-08-29 DIAGNOSIS — I110 Hypertensive heart disease with heart failure: Secondary | ICD-10-CM | POA: Insufficient documentation

## 2020-08-29 DIAGNOSIS — Z853 Personal history of malignant neoplasm of breast: Secondary | ICD-10-CM | POA: Diagnosis not present

## 2020-08-29 DIAGNOSIS — I509 Heart failure, unspecified: Secondary | ICD-10-CM | POA: Insufficient documentation

## 2020-08-29 DIAGNOSIS — W01198A Fall on same level from slipping, tripping and stumbling with subsequent striking against other object, initial encounter: Secondary | ICD-10-CM | POA: Insufficient documentation

## 2020-08-29 DIAGNOSIS — I4891 Unspecified atrial fibrillation: Secondary | ICD-10-CM | POA: Diagnosis not present

## 2020-08-29 DIAGNOSIS — Z91010 Allergy to peanuts: Secondary | ICD-10-CM | POA: Insufficient documentation

## 2020-08-29 DIAGNOSIS — Z96652 Presence of left artificial knee joint: Secondary | ICD-10-CM | POA: Diagnosis not present

## 2020-08-29 DIAGNOSIS — E1169 Type 2 diabetes mellitus with other specified complication: Secondary | ICD-10-CM | POA: Diagnosis not present

## 2020-08-29 DIAGNOSIS — K648 Other hemorrhoids: Secondary | ICD-10-CM | POA: Diagnosis not present

## 2020-08-29 DIAGNOSIS — M7989 Other specified soft tissue disorders: Secondary | ICD-10-CM | POA: Diagnosis not present

## 2020-08-29 LAB — URINALYSIS, ROUTINE W REFLEX MICROSCOPIC
Bilirubin Urine: NEGATIVE
Glucose, UA: NEGATIVE mg/dL
Hgb urine dipstick: NEGATIVE
Ketones, ur: NEGATIVE mg/dL
Leukocytes,Ua: NEGATIVE
Nitrite: NEGATIVE
Protein, ur: >300 mg/dL — AB
Specific Gravity, Urine: 1.025 (ref 1.005–1.030)
pH: 6 (ref 5.0–8.0)

## 2020-08-29 LAB — BASIC METABOLIC PANEL
Anion gap: 9 (ref 5–15)
BUN: 14 mg/dL (ref 8–23)
CO2: 25 mmol/L (ref 22–32)
Calcium: 8.6 mg/dL — ABNORMAL LOW (ref 8.9–10.3)
Chloride: 106 mmol/L (ref 98–111)
Creatinine, Ser: 1.03 mg/dL — ABNORMAL HIGH (ref 0.44–1.00)
GFR, Estimated: 54 mL/min — ABNORMAL LOW (ref >60)
Glucose, Bld: 115 mg/dL — ABNORMAL HIGH (ref 70–99)
Potassium: 3.2 mmol/L — ABNORMAL LOW (ref 3.5–5.1)
Sodium: 140 mmol/L (ref 135–145)

## 2020-08-29 LAB — CBC
HCT: 29.5 % — ABNORMAL LOW (ref 36.0–46.0)
Hemoglobin: 9.3 g/dL — ABNORMAL LOW (ref 12.0–15.0)
MCH: 26.6 pg (ref 26.0–34.0)
MCHC: 31.5 g/dL (ref 30.0–36.0)
MCV: 84.5 fL (ref 80.0–100.0)
Platelets: 290 10*3/uL (ref 150–400)
RBC: 3.49 MIL/uL — ABNORMAL LOW (ref 3.87–5.11)
RDW: 18.2 % — ABNORMAL HIGH (ref 11.5–15.5)
WBC: 7 10*3/uL (ref 4.0–10.5)
nRBC: 0 % (ref 0.0–0.2)

## 2020-08-29 LAB — URINALYSIS, MICROSCOPIC (REFLEX)

## 2020-08-29 LAB — BRAIN NATRIURETIC PEPTIDE: B Natriuretic Peptide: 536.8 pg/mL — ABNORMAL HIGH (ref 0.0–100.0)

## 2020-08-29 LAB — OCCULT BLOOD X 1 CARD TO LAB, STOOL: Fecal Occult Bld: POSITIVE — AB

## 2020-08-29 LAB — TROPONIN I (HIGH SENSITIVITY)
Troponin I (High Sensitivity): 11 ng/L (ref <18)
Troponin I (High Sensitivity): 11 ng/L (ref <18)

## 2020-08-29 MED ORDER — HYDROCORTISONE ACETATE 25 MG RE SUPP: 25.0000 mg | Freq: Two times a day (BID) | RECTAL | 0 refills | Status: DC

## 2020-08-29 MED ORDER — FUROSEMIDE 40 MG PO TABS
40.0000 mg | ORAL_TABLET | Freq: Once | ORAL | Status: AC
  Administered 2020-08-29: 40 mg via ORAL
  Filled 2020-08-29: qty 1

## 2020-08-29 MED ORDER — HYDROCORTISONE (PERIANAL) 2.5 % EX CREA: 1.0000 "application " | TOPICAL_CREAM | Freq: Two times a day (BID) | CUTANEOUS | 0 refills | Status: DC

## 2020-08-29 NOTE — ED Provider Notes (Signed)
[facility] EMERGENCY DEPARTMENT Provider Note   CSN: [id number] Arrival date & time: 08/29/20  1600     History Chief Complaint  Patient presents with   Leg Swelling    [name] is a 87 y.o. female who presents with concern for bilateral lower extremity swelling extremities x3 weeks with mild increase in her shortness of breath though she states that is always short of breath with her heart failure. Additional endorses itchiness and burning in her rectal area that is her primary reason for presenting.  She does have some abdominal pain with associate with his abdominal pain she has fluctuation between diarrhea and constipation.  I personally reviewed the patient's medical records.  She has history of A. fib on Xarelto, CHF, history of MI, type 2 diabetes.  On Xarelto 20 mg daily.  HPI     Past Medical History:  Diagnosis Date   Arthritis    "all over"   Atrial fibrillation ([place])    Diagnosed ~2009   Breast cancer, left breast ([place]) 02/07/1996   S/P chemo and mastectomy    CHF (congestive heart failure) (HCC)    GERD (gastroesophageal reflux disease)    Hyperlipidemia    Hypertension    Left shoulder pain    "MRI showed pinched nerve" - per pt   MI (myocardial infarction) ([place])    OSA on CPAP    Pneumonia    "4-5 times" (01/06/2015)   Type II diabetes mellitus ([place])     Patient Active Problem List   Diagnosis Date Noted   Malnutrition of moderate degree 07/09/2020   Chest pain of uncertain etiology    Unstable angina ([place]) 07/03/2020   Primary osteoarthritis, right ankle and foot 01/24/2018   Arthritis of right ankle 08/21/2017   Allergic urticaria 09/07/2016   Generalized pruritus 09/07/2016   Moderate persistent asthma 09/07/2016   Perennial and seasonal allergic rhinitis 09/07/2016   Chest pain 01/06/2015   Angina at rest [facility]) 01/06/2015   Diabetes ([place]) 01/06/2015   A-fib ([place]) 01/06/2015   HLD (hyperlipidemia) 01/06/2015   Tobacco  abuse 01/06/2015   Diabetes mellitus with complication ([place])    Painful total knee replacement ([employer]) 01/28/2013   Atrial fibrillation (HCC)    Left shoulder pain    Hypertension     Past Surgical History:  Procedure Laterality Date   BREAST BIOPSY Left 1998   CATARACT EXTRACTION W/ INTRAOCULAR LENS  IMPLANT, BILATERAL Bilateral    JOINT REPLACEMENT     LAPAROSCOPIC CHOLECYSTECTOMY     LEFT HEART CATH AND CORONARY ANGIOGRAPHY N/A 07/07/2020   Procedure: LEFT HEART CATH AND CORONARY ANGIOGRAPHY;  Surgeon: Jettie Booze, MD;  Location: [facility];  Service: Cardiovascular;  Laterality: N/A;   MASTECTOMY Left 1998   TOTAL KNEE ARTHROPLASTY Left 2001   TOTAL KNEE REVISION Left 01/28/2013   Procedure: LEFT TOTAL KNEE ARTHROPLASTY REVISION;  Surgeon: Marianna Payment, MD;  Location: [facility];  Service: Orthopedics;  Laterality: Left;     OB History   No obstetric history on file.     Family History  Problem Relation Age of Onset   Heart attack Mother        35s   Emphysema Brother    Allergic rhinitis Neg Hx    Angioedema Neg Hx    Asthma Neg Hx    Eczema Neg Hx    Immunodeficiency Neg Hx    Urticaria Neg Hx     Social History  Tobacco Use   Smoking status: Never   Smokeless tobacco: Current    Types: Snuff  Vaping Use   Vaping Use: Never used  Substance Use Topics   Alcohol use: Not Currently    Comment: 01/06/2015 "I'll have a beer q once in awhile"   Drug use: No    Home Medications Prior to Admission medications   Medication Sig Start Date End Date Taking? Authorizing Provider  hydrocortisone (ANUSOL-HC) 2.5 % rectal cream Place 1 application rectally 2 (two) times daily. 08/29/20  Yes Evva Din R, PA-C  amLODipine (NORVASC) 10 MG tablet Take 10 mg by mouth. 08/10/16   [provider]  atorvastatin (LIPITOR) 40 MG tablet TAKE 1 TABLET BY MOUTH EVERY DAY 08/23/20   Sherren Mocha, MD  Cholecalciferol (VITAMIN D) 2000 units tablet  Take 2,000 Units by mouth daily.    [provider]  FLOVENT HFA 110 MCG/ACT inhaler TAKE 2 PUFFS BY MOUTH TWICE A DAY Patient taking differently: Inhale 2 puffs into the lungs in the morning and at bedtime. 03/21/17   Bobbitt, Sedalia Muta, MD  furosemide (LASIX) 20 MG tablet Take 1 tablet (20 mg total) by mouth daily. 08/12/20   Loel Dubonnet, NP  gabapentin (NEURONTIN) 100 MG capsule Take 100-200 mg by mouth at bedtime. 06/22/20   [provider]  metoprolol tartrate (LOPRESSOR) 25 MG tablet Take 1 tablet (25 mg total) by mouth 2 (two) times daily. 08/13/20   Loel Dubonnet, NP  omeprazole (PRILOSEC) 20 MG capsule Take 2 capsules (40 mg total) by mouth daily for 14 days. 07/31/20 08/14/20  Little, Wenda Overland, MD  Rivaroxaban (XARELTO) 15 MG TABS tablet Take 1 tablet (15 mg total) by mouth daily with supper. 07/09/20   Cheryln Manly, NP    Allergies    Ferumoxytol, Chocolate, Lisinopril, and Peanuts [peanut oil]  Review of Systems   Review of Systems  Constitutional: Negative.   HENT: Negative.    Eyes: Negative.   Respiratory:  Positive for shortness of breath.   Cardiovascular:  Positive for leg swelling.  Gastrointestinal: Negative.  Negative for abdominal pain, anal bleeding, blood in stool, constipation, diarrhea, nausea, rectal pain and vomiting.  Genitourinary: Negative.        Rectal itching  Musculoskeletal: Negative.   Neurological: Negative.    Physical Exam Updated Vital Signs BP (!) 135/97   Pulse 82   Temp 98.4 F (36.9 C) (Oral)   Resp (!) 23   Ht '5\' 4"'$  (1.626 m)   Wt 70.1 kg   SpO2 97%   BMI 26.53 kg/m   Physical Exam Vitals and nursing note reviewed. Exam conducted with a chaperone present.  Constitutional:      Appearance: She is overweight. She is not ill-appearing or toxic-appearing.  HENT:     Head: Normocephalic and atraumatic.     Nose: Nose normal.     Mouth/Throat:     Mouth: Mucous membranes are moist.     Pharynx:  Uvula midline. No oropharyngeal exudate or posterior oropharyngeal erythema.     Tonsils: No tonsillar exudate.  Eyes:     General: Lids are normal. Vision grossly intact.        Right eye: No discharge.        Left eye: No discharge.     Extraocular Movements: Extraocular movements intact.     Conjunctiva/sclera: Conjunctivae normal.     Pupils: Pupils are equal, round, and reactive to light.  Neck:  Trachea: Trachea and phonation normal.  Cardiovascular:     Rate and Rhythm: Normal rate and regular rhythm.     Pulses: Normal pulses.     Heart sounds: Normal heart sounds. No murmur heard. Pulmonary:     Effort: Pulmonary effort is normal. No tachypnea, bradypnea, accessory muscle usage, prolonged expiration or respiratory distress.     Breath sounds: Examination of the right-lower field reveals decreased breath sounds. Decreased breath sounds present. No wheezing or rales.  Chest:     Chest wall: No mass, lacerations, deformity, swelling, tenderness, crepitus or edema.  Abdominal:     General: Bowel sounds are normal. There is no distension.     Palpations: Abdomen is soft.     Tenderness: There is no abdominal tenderness. There is no right CVA tenderness, left CVA tenderness, guarding or rebound.  Genitourinary:   Musculoskeletal:        General: No deformity.     Cervical back: Normal range of motion and neck supple. No edema, rigidity or crepitus. No pain with movement, spinous process tenderness or muscular tenderness.     Right lower leg: No edema.     Left lower leg: No edema.  Lymphadenopathy:     Cervical: No cervical adenopathy.  Skin:    General: Skin is warm and dry.     Capillary Refill: Capillary refill takes less than 2 seconds.  Neurological:     General: No focal deficit present.     Mental Status: She is alert and oriented to person, place, and time. Mental status is at baseline.  Psychiatric:        Mood and Affect: Mood normal.    ED Results /  Procedures / Treatments   Labs (all labs ordered are listed, but only abnormal results are displayed) Labs Reviewed  BASIC METABOLIC PANEL - Abnormal; Notable for the following components:      Result Value   Potassium 3.2 (*)    Glucose, Bld 115 (*)    Creatinine, Ser 1.03 (*)    Calcium 8.6 (*)    GFR, Estimated 54 (*)    All other components within normal limits  CBC - Abnormal; Notable for the following components:   RBC 3.49 (*)    Hemoglobin 9.3 (*)    HCT 29.5 (*)    RDW 18.2 (*)    All other components within normal limits  BRAIN NATRIURETIC PEPTIDE - Abnormal; Notable for the following components:   B Natriuretic Peptide 536.8 (*)    All other components within normal limits  URINALYSIS, ROUTINE W REFLEX MICROSCOPIC - Abnormal; Notable for the following components:   Protein, ur >300 (*)    All other components within normal limits  URINALYSIS, MICROSCOPIC (REFLEX) - Abnormal; Notable for the following components:   Bacteria, UA MANY (*)    Non Squamous Epithelial PRESENT (*)    All other components within normal limits  OCCULT BLOOD X 1 CARD TO LAB, STOOL  TROPONIN I (HIGH SENSITIVITY)  TROPONIN I (HIGH SENSITIVITY)    EKG EKG Interpretation  Date/Time:  Sunday August 29 2020 16:11:50 EDT Ventricular Rate:  69 PR Interval:    QRS Duration: 90 QT Interval:  428 QTC Calculation: 458 R Axis:   158 Text Interpretation: Atrial fibrillation with premature ventricular or aberrantly conducted complexes Right axis deviation Low voltage QRS Septal infarct , age undetermined Abnormal ECG Confirmed by Quintella Reichert [phone]) on 08/29/2020 4:24:59 PM  Radiology DG Chest 2 View  Result Date:  08/29/2020 CLINICAL DATA:  Shortness of breath. Bilateral lower extremity swelling. EXAM: CHEST - 2 VIEW COMPARISON:  August 14, 2020. FINDINGS: Stable cardiomegaly. No pneumothorax or pleural effusion is noted. Mild bibasilar subsegmental atelectasis or possible pulmonary edema is noted.  Bony thorax is unremarkable. IMPRESSION: Stable cardiomegaly. Mild bibasilar subsegmental atelectasis or possible pulmonary edema is noted. Electronically Signed   By: Marijo Conception M.D.   On: 08/29/2020 16:45   CT Head Wo Contrast  Result Date: 08/29/2020 CLINICAL DATA:  [place] 2 weeks ago hitting her face. Bruising around the right eye. On blood thinners. EXAM: CT HEAD WITHOUT CONTRAST TECHNIQUE: Contiguous axial images were obtained from the base of the skull through the vertex without intravenous contrast. COMPARISON:  02/05/2019 and older exams. FINDINGS: Brain: No evidence of acute infarction, hemorrhage, hydrocephalus, extra-axial collection or mass lesion/mass effect. Vascular: No hyperdense vessel or unexpected calcification. Skull: Normal. Negative for fracture or focal lesion. Sinuses/Orbits: Right preseptal periorbital soft tissue hemorrhage/swelling. Globes and postseptal orbits are unremarkable. Visualized sinuses are clear. Other: Small right forehead scalp contusion. IMPRESSION: 1. No acute intracranial abnormalities. 2. Small right forehead scalp contusion and mild right preseptal periorbital soft tissue swelling/hemorrhage. No injury to the right globe or postseptal orbit. Electronically Signed   By: Lajean Manes M.D.   On: 08/29/2020 18:19    Procedures Procedures   Medications Ordered in ED Medications  furosemide (LASIX) tablet 40 mg (40 mg Oral Given 08/29/20 1821)    ED Course  I have reviewed the triage vital signs and the nursing notes.  Pertinent labs & imaging results that were available during my care of the patient were reviewed by me and considered in my medical decision making (see chart for details).    MDM Rules/Calculators/A&P                         87 year old female with multiple complaints including worsening leg swelling in context of CHF as well as rectal itching.  Differential diagnose includes but are not limited to pleural effusions, heart failure  exacerbation, pneumonia, ACS, regarding rectal itching suspect hemorrhoids.  Vital signs are normal intake.  Cardiopulmonary exam is overall reassuring though there is some diminished breath sounds right lung base.  Abdominal exam is benign.  GU exam performed in the presence of chaperone which did reveal nonthrombosed external hemorrhoids.  Nonmelanotic appearing stool on the gloved finger the provider.  Basic labs obtained in triage.  CBC with anemia with hemoglobin 9.3 near patient baseline.  BMP with hypokalemia 3.2, Creatinine of 1.03 at patient's baseline.  BNP elevated to 536 near patient's baseline.  UA unremarkable.  Troponin is negative, 11.  CT of the head negative for acute intracranial normalities though she does have small right forehead scalp contusion and preseptal periorbital swelling and bruising on the right.  No globe or post septal orbit injury.  Chest x-ray with some pulmonary edema.  EKG reassuring with A. fib at patient's baseline, no ischemic changes.  Physical exam and work-up most consistent with acute CHF exacerbation.  Patient declined IV, extra dose of Lasix administered orally.  Rectal itching secondary to nonthrombosed external hemorrhoids.  Will discharge with hydrocortisone cream and recommend sitz bath's.  No further work up is warranted in the ED at this time. Recommend she follow-up closely with PCP. Lavon voiced understanding of her medical evaluation and her treatment plan.  Each of her questions answered to her expressed satisfaction.  Return precautions given.  Patient is well-appearing, stable, improved for discharge.  This chart was dictated using voice recognition software, Dragon. Despite the best efforts of this provider to proofread and correct errors, errors may still occur which can change documentation meaning.  Final Clinical Impression(s) / ED Diagnoses Final diagnoses:  External hemorrhoid    Rx / DC Orders ED Discharge Orders           Ordered    hydrocortisone (ANUSOL-HC) 25 MG suppository  2 times daily,   Status:  Discontinued        08/29/20 1931    hydrocortisone (ANUSOL-HC) 2.5 % rectal cream  2 times daily        08/29/20 1931             Keatyn Jawad, Sharlene Dory 08/29/20 1951    Quintella Reichert, MD 08/30/20 [phone]

## 2020-08-29 NOTE — ED Notes (Signed)
Ysidro Evert RN witnessed rectal exam with PA

## 2020-08-29 NOTE — ED Triage Notes (Signed)
Pt c/o bilateral leg swelling up to knees x 3 weeks. States has some shortness of breath. Also c/o rectal itching/burning & mid/left abdominal pain.   Also states fell 2 weeks ago and hit face, bruising noted to right eye. On thinners

## 2020-08-29 NOTE — Discharge Instructions (Addendum)
You were seen in the ER today for your leg swelling and your rectal itching.  You do have some increase in your heart failure symptoms with swelling in your legs.  You were administered extra dose of Lasix in the emergency department. Please continue take this as prescribed, and follow-up with your primary care doctor.  Regarding your rectal issuing you have some external hemorrhoids.  You have been prescribed hydrocortisone cream to apply to the rectal area to help with your itching.  Please use as prescribed follow-up department care doctor.  Return to ER for develop any new chest pain, difficulty breathing, nausea or vomiting does not stop, or any new severe symptoms.

## 2020-09-03 ENCOUNTER — Other Ambulatory Visit: Payer: Medicare Other

## 2020-09-07 ENCOUNTER — Telehealth: Payer: Self-pay | Admitting: *Deleted

## 2020-09-07 NOTE — Telephone Encounter (Signed)
   [facility] Pre-operative Risk Assessment    Patient Name: [name]  DOB: Jan 21, 1934 MRN: [id number]  HEARTCARE STAFF:  - IMPORTANT!!!!!! Under Visit Info/Reason for Call, type in Other and utilize the format Clearance MM/DD/YY or Clearance TBD. Do not use dashes or single digits. - Please review there is not already an duplicate clearance open for this procedure. - If request is for dental extraction, please clarify the # of teeth to be extracted. - If the patient is currently at the dentist's office, call Pre-Op Callback Staff (MA/nurse) to input urgent request.  - If the patient is not currently in the dentist office, please route to the Pre-Op pool.  Request for surgical clearance:  What type of surgery is being performed? REMOVE AND REPLACE IOL-LEFT EYE   When is this surgery scheduled? TBD  What type of clearance is required (medical clearance vs. Pharmacy clearance to hold med vs. Both)? MEDICAL  Are there any medications that need to be held prior to surgery and how long? PER DR. [place] NO BLOOD THINNERS WILL NEED TO BE HELD  Practice name and name of physician performing surgery? [facility]; DR. Julian Reil  What is the office phone number? [phone]   6.   What is the office fax number? [phone]  8.   Anesthesia type (None, local, MAC, general) ? IV SEDATION   Julaine Hua 09/07/2020, 2:06 PM  _________________________________________________________________   (provider comments below)

## 2020-09-07 NOTE — Telephone Encounter (Signed)
    Patient Name: [name]  DOB: Aug 23, 1933 MRN: [id number]  Primary Cardiologist: Sherren Mocha, MD  Chart reviewed as part of pre-operative protocol coverage. Given past medical history and time since last visit, based on ACC/AHA guidelines, [name] would be at acceptable risk for the planned procedure without further cardiovascular testing. Recent cardiac catheterization in June 2022 showed minimal CAD.   The patient was advised that if she develops new symptoms prior to surgery to contact our office to arrange for a follow-up visit, and she verbalized understanding.  I will route this recommendation to the requesting party via Epic fax function and remove from pre-op pool.  Please call with questions.  [place], [place] 09/07/2020, 4:03 PM

## 2020-09-11 ENCOUNTER — Inpatient Hospital Stay (HOSPITAL_BASED_OUTPATIENT_CLINIC_OR_DEPARTMENT_OTHER)
Admission: EM | Admit: 2020-09-11 | Discharge: 2020-09-13 | DRG: 291 | Disposition: A | Discharge status: Home Health | Payer: Medicare Other | Source: Ambulatory Visit | Attending: Internal Medicine | Admitting: Internal Medicine

## 2020-09-11 ENCOUNTER — Encounter (HOSPITAL_COMMUNITY): Payer: Self-pay | Admitting: Family Medicine

## 2020-09-11 ENCOUNTER — Other Ambulatory Visit: Payer: Self-pay

## 2020-09-11 DIAGNOSIS — E876 Hypokalemia: Secondary | ICD-10-CM | POA: Diagnosis not present

## 2020-09-11 DIAGNOSIS — I5033 Acute on chronic diastolic (congestive) heart failure: Secondary | ICD-10-CM | POA: Diagnosis not present

## 2020-09-11 DIAGNOSIS — I509 Heart failure, unspecified: Secondary | ICD-10-CM | POA: Diagnosis not present

## 2020-09-11 DIAGNOSIS — Z825 Family history of asthma and other chronic lower respiratory diseases: Secondary | ICD-10-CM

## 2020-09-11 DIAGNOSIS — Z96652 Presence of left artificial knee joint: Secondary | ICD-10-CM | POA: Diagnosis present

## 2020-09-11 DIAGNOSIS — E785 Hyperlipidemia, unspecified: Secondary | ICD-10-CM | POA: Diagnosis present

## 2020-09-11 DIAGNOSIS — I4819 Other persistent atrial fibrillation: Secondary | ICD-10-CM | POA: Diagnosis not present

## 2020-09-11 DIAGNOSIS — D631 Anemia in chronic kidney disease: Secondary | ICD-10-CM | POA: Diagnosis present

## 2020-09-11 DIAGNOSIS — Z9221 Personal history of antineoplastic chemotherapy: Secondary | ICD-10-CM

## 2020-09-11 DIAGNOSIS — Z20822 Contact with and (suspected) exposure to covid-19: Secondary | ICD-10-CM | POA: Diagnosis present

## 2020-09-11 DIAGNOSIS — E1122 Type 2 diabetes mellitus with diabetic chronic kidney disease: Secondary | ICD-10-CM | POA: Diagnosis not present

## 2020-09-11 DIAGNOSIS — N183 Chronic kidney disease, stage 3 unspecified: Secondary | ICD-10-CM | POA: Diagnosis present

## 2020-09-11 DIAGNOSIS — F17290 Nicotine dependence, other tobacco product, uncomplicated: Secondary | ICD-10-CM | POA: Diagnosis not present

## 2020-09-11 DIAGNOSIS — Z7901 Long term (current) use of anticoagulants: Secondary | ICD-10-CM

## 2020-09-11 DIAGNOSIS — T502X5A Adverse effect of carbonic-anhydrase inhibitors, benzothiadiazides and other diuretics, initial encounter: Secondary | ICD-10-CM | POA: Diagnosis present

## 2020-09-11 DIAGNOSIS — I252 Old myocardial infarction: Secondary | ICD-10-CM | POA: Diagnosis not present

## 2020-09-11 DIAGNOSIS — J4540 Moderate persistent asthma, uncomplicated: Secondary | ICD-10-CM | POA: Diagnosis not present

## 2020-09-11 DIAGNOSIS — I130 Hypertensive heart and chronic kidney disease with heart failure and stage 1 through stage 4 chronic kidney disease, or unspecified chronic kidney disease: Secondary | ICD-10-CM | POA: Diagnosis not present

## 2020-09-11 DIAGNOSIS — D649 Anemia, unspecified: Secondary | ICD-10-CM | POA: Diagnosis present

## 2020-09-11 DIAGNOSIS — Z8249 Family history of ischemic heart disease and other diseases of the circulatory system: Secondary | ICD-10-CM | POA: Diagnosis not present

## 2020-09-11 DIAGNOSIS — D638 Anemia in other chronic diseases classified elsewhere: Secondary | ICD-10-CM | POA: Diagnosis present

## 2020-09-11 DIAGNOSIS — G4733 Obstructive sleep apnea (adult) (pediatric): Secondary | ICD-10-CM

## 2020-09-11 DIAGNOSIS — E119 Type 2 diabetes mellitus without complications: Secondary | ICD-10-CM

## 2020-09-11 DIAGNOSIS — Z9012 Acquired absence of left breast and nipple: Secondary | ICD-10-CM | POA: Diagnosis not present

## 2020-09-11 DIAGNOSIS — N1831 Chronic kidney disease, stage 3a: Secondary | ICD-10-CM | POA: Diagnosis present

## 2020-09-11 DIAGNOSIS — Z853 Personal history of malignant neoplasm of breast: Secondary | ICD-10-CM

## 2020-09-11 DIAGNOSIS — I2510 Atherosclerotic heart disease of native coronary artery without angina pectoris: Secondary | ICD-10-CM | POA: Diagnosis not present

## 2020-09-11 DIAGNOSIS — Z79899 Other long term (current) drug therapy: Secondary | ICD-10-CM

## 2020-09-11 DIAGNOSIS — I10 Essential (primary) hypertension: Secondary | ICD-10-CM | POA: Diagnosis present

## 2020-09-11 LAB — COMPREHENSIVE METABOLIC PANEL
ALT: 9 U/L (ref 0–44)
AST: 23 U/L (ref 15–41)
Albumin: 4 g/dL (ref 3.5–5.0)
Alkaline Phosphatase: 57 U/L (ref 38–126)
Anion gap: 9 (ref 5–15)
BUN: 13 mg/dL (ref 8–23)
CO2: 30 mmol/L (ref 22–32)
Calcium: 8.1 mg/dL — ABNORMAL LOW (ref 8.9–10.3)
Chloride: 95 mmol/L — ABNORMAL LOW (ref 98–111)
Creatinine, Ser: 1.16 mg/dL — ABNORMAL HIGH (ref 0.44–1.00)
GFR, Estimated: 46 mL/min — ABNORMAL LOW (ref >60)
Glucose, Bld: 117 mg/dL — ABNORMAL HIGH (ref 70–99)
Potassium: 3 mmol/L — ABNORMAL LOW (ref 3.5–5.1)
Sodium: 134 mmol/L — ABNORMAL LOW (ref 135–145)
Total Bilirubin: 0.8 mg/dL (ref 0.3–1.2)
Total Protein: 7.6 g/dL (ref 6.5–8.1)

## 2020-09-11 LAB — CBC WITH DIFFERENTIAL/PLATELET
Abs Immature Granulocytes: 0.04 10*3/uL (ref 0.00–0.07)
Basophils Absolute: 0.1 10*3/uL (ref 0.0–0.1)
Basophils Relative: 1 %
Eosinophils Absolute: 0.2 10*3/uL (ref 0.0–0.5)
Eosinophils Relative: 3 %
HCT: 27.6 % — ABNORMAL LOW (ref 36.0–46.0)
Hemoglobin: 8.8 g/dL — ABNORMAL LOW (ref 12.0–15.0)
Immature Granulocytes: 1 %
Lymphocytes Relative: 15 %
Lymphs Abs: 1.3 10*3/uL (ref 0.7–4.0)
MCH: 26.6 pg (ref 26.0–34.0)
MCHC: 31.9 g/dL (ref 30.0–36.0)
MCV: 83.4 fL (ref 80.0–100.0)
Monocytes Absolute: 0.9 10*3/uL (ref 0.1–1.0)
Monocytes Relative: 10 %
Neutro Abs: 6.2 10*3/uL (ref 1.7–7.7)
Neutrophils Relative %: 70 %
Platelets: 232 10*3/uL (ref 150–400)
RBC: 3.31 MIL/uL — ABNORMAL LOW (ref 3.87–5.11)
RDW: 17.5 % — ABNORMAL HIGH (ref 11.5–15.5)
WBC: 8.7 10*3/uL (ref 4.0–10.5)
nRBC: 0 % (ref 0.0–0.2)

## 2020-09-11 LAB — RESP PANEL BY RT-PCR (FLU A&B, COVID) ARPGX2
Influenza A by PCR: NEGATIVE
Influenza B by PCR: NEGATIVE
SARS Coronavirus 2 by RT PCR: NEGATIVE

## 2020-09-11 LAB — TROPONIN I (HIGH SENSITIVITY)
Troponin I (High Sensitivity): 11 ng/L (ref <18)
Troponin I (High Sensitivity): 12 ng/L (ref <18)

## 2020-09-11 LAB — BRAIN NATRIURETIC PEPTIDE: B Natriuretic Peptide: 487.3 pg/mL — ABNORMAL HIGH (ref 0.0–100.0)

## 2020-09-11 MED ORDER — ACETAMINOPHEN 650 MG RE SUPP: 650.0000 mg | Freq: Four times a day (QID) | RECTAL | Status: DC | PRN

## 2020-09-11 MED ORDER — ACETAMINOPHEN 325 MG PO TABS
650.0000 mg | ORAL_TABLET | Freq: Four times a day (QID) | ORAL | Status: DC | PRN
  Administered 2020-09-11: 650 mg via ORAL
  Filled 2020-09-11: qty 2

## 2020-09-11 MED ORDER — RIVAROXABAN 15 MG PO TABS
15.0000 mg | ORAL_TABLET | Freq: Every day | ORAL | Status: DC
  Administered 2020-09-12: 15 mg via ORAL
  Filled 2020-09-11: qty 1

## 2020-09-11 MED ORDER — GABAPENTIN 100 MG PO CAPS
100.0000 mg | ORAL_CAPSULE | Freq: Every day | ORAL | Status: DC
Start: 2020-09-11 — End: 2020-09-13
  Administered 2020-09-11 – 2020-09-12 (×2): 200 mg via ORAL
  Filled 2020-09-11 (×2): qty 2

## 2020-09-11 MED ORDER — SENNOSIDES-DOCUSATE SODIUM 8.6-50 MG PO TABS: 1.0000 | ORAL_TABLET | Freq: Every evening | ORAL | Status: DC | PRN

## 2020-09-11 MED ORDER — SODIUM CHLORIDE 0.9% FLUSH
3.0000 mL | Freq: Two times a day (BID) | INTRAVENOUS | Status: DC
  Administered 2020-09-12 – 2020-09-13 (×3): 3 mL via INTRAVENOUS

## 2020-09-11 MED ORDER — FUROSEMIDE 10 MG/ML IJ SOLN
40.0000 mg | Freq: Two times a day (BID) | INTRAMUSCULAR | Status: DC
  Administered 2020-09-12: 40 mg via INTRAVENOUS
  Filled 2020-09-11: qty 4

## 2020-09-11 MED ORDER — POTASSIUM CHLORIDE 20 MEQ PO PACK: 20.0000 meq | PACK | Freq: Every day | ORAL | Status: DC

## 2020-09-11 MED ORDER — ALBUTEROL SULFATE HFA 108 (90 BASE) MCG/ACT IN AERS
1.0000 | INHALATION_SPRAY | Freq: Four times a day (QID) | RESPIRATORY_TRACT | Status: DC | PRN
  Filled 2020-09-11: qty 6.7

## 2020-09-11 MED ORDER — POTASSIUM CHLORIDE 10 MEQ/100ML IV SOLN
10.0000 meq | INTRAVENOUS | Status: AC
Start: 2020-09-11 — End: 2020-09-11
  Administered 2020-09-11 (×3): 10 meq via INTRAVENOUS
  Filled 2020-09-11 (×3): qty 100

## 2020-09-11 MED ORDER — ATORVASTATIN CALCIUM 40 MG PO TABS
40.0000 mg | ORAL_TABLET | Freq: Every day | ORAL | Status: DC
  Administered 2020-09-12 – 2020-09-13 (×2): 40 mg via ORAL
  Filled 2020-09-11 (×2): qty 1

## 2020-09-11 MED ORDER — ONDANSETRON HCL 4 MG PO TABS: 4.0000 mg | ORAL_TABLET | Freq: Four times a day (QID) | ORAL | Status: DC | PRN

## 2020-09-11 MED ORDER — ONDANSETRON HCL 4 MG/2ML IJ SOLN: 4.0000 mg | Freq: Four times a day (QID) | INTRAMUSCULAR | Status: DC | PRN

## 2020-09-11 MED ORDER — FUROSEMIDE 10 MG/ML IJ SOLN
40.0000 mg | Freq: Once | INTRAMUSCULAR | Status: AC
  Administered 2020-09-11: 40 mg via INTRAVENOUS
  Filled 2020-09-11: qty 4

## 2020-09-11 MED ORDER — BUDESONIDE 0.25 MG/2ML IN SUSP
0.2500 mg | Freq: Two times a day (BID) | RESPIRATORY_TRACT | Status: DC
  Administered 2020-09-12 – 2020-09-13 (×3): 0.25 mg via RESPIRATORY_TRACT
  Filled 2020-09-11 (×3): qty 2

## 2020-09-11 MED ORDER — METOPROLOL TARTRATE 25 MG PO TABS
25.0000 mg | ORAL_TABLET | Freq: Two times a day (BID) | ORAL | Status: DC
  Administered 2020-09-11 – 2020-09-13 (×3): 25 mg via ORAL
  Filled 2020-09-11 (×3): qty 1

## 2020-09-11 NOTE — Progress Notes (Signed)
Placed on cpap auto settings with ffm as pt wears at home.

## 2020-09-11 NOTE — Progress Notes (Signed)
Accepted to tele bed at [facility]. [facility] to assume care when patient arrives at accepting facility.

## 2020-09-11 NOTE — ED Provider Notes (Signed)
[facility] Provider Note   CSN: [id number] Arrival date & time: 09/11/20  1707     History Chief Complaint  Patient presents with   Shortness of Breath    [name] is a 87 y.o. female.   Shortness of Breath Associated symptoms: abdominal pain and cough   Associated symptoms: no chest pain and no rash   Patient presents with shortness of breath dizziness some fatigue.  Has had some slight abdominal pain and diarrhea also.  States she is more short of breath.  States it is worse with exertion.  States she cannot do the same activity should be able to do otherwise.  Feels as if she is carrying some extra fluid.  Does have history of CHF.  Was seen in urgent care and sent here due to dyspnea.  X-ray at urgent care showed volume overload/pulmonary edema.  Also negative COVID test.  No fevers or chills.  Occasional cough without sputum production.    Past Medical History:  Diagnosis Date   Arthritis    "all over"   Atrial fibrillation ([place])    Diagnosed ~2009   Breast cancer, left breast ([place]) 02/07/1996   S/P chemo and mastectomy    CHF (congestive heart failure) ([place])    GERD (gastroesophageal reflux disease)    Hyperlipidemia    Hypertension    Left shoulder pain    "MRI showed pinched nerve" - per pt   MI (myocardial infarction) ([place])    OSA on CPAP    Pneumonia    "4-5 times" (01/06/2015)   Type II diabetes mellitus ([place])     Patient Active Problem List   Diagnosis Date Noted   Malnutrition of moderate degree 07/09/2020   Chest pain of uncertain etiology    Unstable angina ([place]) 07/03/2020   Primary osteoarthritis, right ankle and foot 01/24/2018   Arthritis of right ankle 08/21/2017   Allergic urticaria 09/07/2016   Generalized pruritus 09/07/2016   Moderate persistent asthma 09/07/2016   Perennial and seasonal allergic rhinitis 09/07/2016   Chest pain 01/06/2015   Angina at rest [facility]) 01/06/2015   Diabetes ([place])  01/06/2015   A-fib ([place]) 01/06/2015   HLD (hyperlipidemia) 01/06/2015   Tobacco abuse 01/06/2015   Diabetes mellitus with complication ([place])    Painful total knee replacement ([place]) 01/28/2013   Atrial fibrillation (HCC)    Left shoulder pain    Hypertension     Past Surgical History:  Procedure Laterality Date   BREAST BIOPSY Left 1998   CATARACT EXTRACTION W/ INTRAOCULAR LENS  IMPLANT, BILATERAL Bilateral    JOINT REPLACEMENT     LAPAROSCOPIC CHOLECYSTECTOMY     LEFT HEART CATH AND CORONARY ANGIOGRAPHY N/A 07/07/2020   Procedure: LEFT HEART CATH AND CORONARY ANGIOGRAPHY;  Surgeon: Jettie Booze, MD;  Location: [facility];  Service: Cardiovascular;  Laterality: N/A;   MASTECTOMY Left 1998   TOTAL KNEE ARTHROPLASTY Left 2001   TOTAL KNEE REVISION Left 01/28/2013   Procedure: LEFT TOTAL KNEE ARTHROPLASTY REVISION;  Surgeon: Marianna Payment, MD;  Location: [facility];  Service: Orthopedics;  Laterality: Left;     OB History   No obstetric history on file.     Family History  Problem Relation Age of Onset   Heart attack Mother        39s   Emphysema Brother    Allergic rhinitis Neg Hx    Angioedema Neg Hx    Asthma Neg Hx  Eczema Neg Hx    Immunodeficiency Neg Hx    Urticaria Neg Hx     Social History   Tobacco Use   Smoking status: Never   Smokeless tobacco: Current    Types: Snuff  Vaping Use   Vaping Use: Never used  Substance Use Topics   Alcohol use: Not Currently    Comment: 01/06/2015 "I'll have a beer q once in awhile"   Drug use: No    Home Medications Prior to Admission medications   Medication Sig Start Date End Date Taking? Authorizing Provider  amLODipine (NORVASC) 10 MG tablet Take 10 mg by mouth. 08/10/16   [provider]  atorvastatin (LIPITOR) 40 MG tablet TAKE 1 TABLET BY MOUTH EVERY DAY 08/23/20   Sherren Mocha, MD  Cholecalciferol (VITAMIN D) 2000 units tablet Take 2,000 Units by mouth daily.    [provider]  FLOVENT HFA 110 MCG/ACT inhaler TAKE 2 PUFFS BY MOUTH TWICE A DAY Patient taking differently: Inhale 2 puffs into the lungs in the morning and at bedtime. 03/21/17   Bobbitt, Sedalia Muta, MD  furosemide (LASIX) 20 MG tablet Take 1 tablet (20 mg total) by mouth daily. 08/12/20   Loel Dubonnet, NP  gabapentin (NEURONTIN) 100 MG capsule Take 100-200 mg by mouth at bedtime. 06/22/20   [provider]  hydrocortisone (ANUSOL-HC) 2.5 % rectal cream Place 1 application rectally 2 (two) times daily. 08/29/20   Sponseller, Eugene Garnet R, PA-C  metoprolol tartrate (LOPRESSOR) 25 MG tablet Take 1 tablet (25 mg total) by mouth 2 (two) times daily. 08/13/20   Loel Dubonnet, NP  omeprazole (PRILOSEC) 20 MG capsule Take 2 capsules (40 mg total) by mouth daily for 14 days. 07/31/20 08/14/20  Little, Wenda Overland, MD  Rivaroxaban (XARELTO) 15 MG TABS tablet Take 1 tablet (15 mg total) by mouth daily with supper. 07/09/20   Cheryln Manly, NP    Allergies    Ferumoxytol, Chocolate, Lisinopril, and Peanuts [peanut oil]  Review of Systems   Review of Systems  Constitutional:  Negative for appetite change.  HENT:  Negative for congestion.   Respiratory:  Positive for cough and shortness of breath.   Cardiovascular:  Negative for chest pain.  Gastrointestinal:  Positive for abdominal pain and diarrhea.  Musculoskeletal:  Negative for back pain.  Skin:  Negative for rash.  Psychiatric/Behavioral:  Negative for confusion.    Physical Exam Updated Vital Signs BP 104/67   Pulse 71   Temp 98.2 F (36.8 C) (Oral)   Resp 18   Ht '5\' 4"'$  (1.626 m)   Wt 68.9 kg   SpO2 95%   BMI 26.09 kg/m   Physical Exam Vitals and nursing note reviewed.  HENT:     Head: Atraumatic.  Neck:     Vascular: JVD present.  Cardiovascular:     Rate and Rhythm: Normal rate and regular rhythm.  Pulmonary:     Breath sounds: Normal breath sounds.  Chest:     Chest wall: No tenderness.  Abdominal:     Comments:  Mild upper abdominal tenderness without rebound or guarding.  No hernia..  Musculoskeletal:     Cervical back: Neck supple.     Right lower leg: Edema present.     Left lower leg: Edema present.  Skin:    General: Skin is warm.     Capillary Refill: Capillary refill takes less than 2 seconds.  Neurological:     Mental Status: She is alert and  oriented to person, place, and time.    ED Results / Procedures / Treatments   Labs (all labs ordered are listed, but only abnormal results are displayed) Labs Reviewed  COMPREHENSIVE METABOLIC PANEL - Abnormal; Notable for the following components:      Result Value   Sodium 134 (*)    Potassium 3.0 (*)    Chloride 95 (*)    Glucose, Bld 117 (*)    Creatinine, Ser 1.16 (*)    Calcium 8.1 (*)    GFR, Estimated 46 (*)    All other components within normal limits  CBC WITH DIFFERENTIAL/PLATELET - Abnormal; Notable for the following components:   RBC 3.31 (*)    Hemoglobin 8.8 (*)    HCT 27.6 (*)    RDW 17.5 (*)    All other components within normal limits  BRAIN NATRIURETIC PEPTIDE - Abnormal; Notable for the following components:   B Natriuretic Peptide 487.3 (*)    All other components within normal limits  RESP PANEL BY RT-PCR (FLU A&B, COVID) ARPGX2  TROPONIN I (HIGH SENSITIVITY)  TROPONIN I (HIGH SENSITIVITY)    EKG EKG Interpretation  Date/Time:  Saturday September 11 2020 17:29:00 EDT Ventricular Rate:  77 PR Interval:    QRS Duration: 101 QT Interval:  456 QTC Calculation: 517 R Axis:   135 Text Interpretation: Atrial fibrillation Paired ventricular premature complexes Probable lateral infarct, age indeterminate Probable anteroseptal infarct, old Prolonged QT interval Confirmed by Davonna Belling [phone]) on 09/11/2020 6:10:51 PM  Radiology No results found.  Procedures Procedures   Medications Ordered in ED Medications  potassium chloride 10 mEq in 100 mL IVPB (has no administration in time range)  furosemide  (LASIX) injection 40 mg (has no administration in time range)    ED Course  I have reviewed the triage vital signs and the nursing notes.  Pertinent labs & imaging results that were available during my care of the patient were reviewed by me and considered in my medical decision making (see chart for details).    MDM Rules/Calculators/A&P                           Patient presents with shortness of breath.  Cough.  History of CHF.  Appears volume overloaded.  History of A. fib and is in A. fib at this time.  BNP is elevated.  Also hypokalemia and will supplement.  Chest x-ray done at urgent care showed CHF.  Patient was talking on the phone and her saturations went down into the 80s.  With the saturation with the exertion of just talking I feel patient would benefit from mission to the hospital.  We will give some Lasix here and will also supplement potassium.  Admit to hospitalist Final Clinical Impression(s) / ED Diagnoses Final diagnoses:  Acute on chronic congestive heart failure, unspecified heart failure type [facility])    Rx / DC Orders ED Discharge Orders     None        Davonna Belling, MD 09/11/20 1942

## 2020-09-11 NOTE — ED Triage Notes (Signed)
Pt c/o shortness of breath and dizziness x a few days. Denies chest pain. Hx of CHF.

## 2020-09-11 NOTE — H&P (Signed)
History and Physical    [name] [id number] DOB: [birth year] DOA: 09/11/2020  PCP: Elisabeth Cara, PA-C  Patient coming from: [facility] ED via urgent care  I have personally briefly reviewed patient's old medical records in [place]  Chief Complaint: Dyspnea  HPI: [name] is a 87 y.o. female with medical history significant for persistent atrial fibrillation on Xarelto, chronic diastolic CHF (last EF 0000000 07/04/2020), nonobstructive CAD, CKD stage IIIa, diet-controlled T2DM, HTN, HLD, left breast cancer s/p chemotherapy and mastectomy, and OSA on CPAP who presented to the ED for evaluation of dyspnea.  Patient reports about 1 week of progressive shortness of breath, worse with exertion.  She has had increased orthopnea from baseline.  She has also seen some swelling in her legs.  She has associated cough sometimes productive of clear sputum.  She has not had any chest pain.  She reports good urine output.  She has not had any nausea or vomiting.  She reports chronic abdominal discomfort which is being evaluated by her GI specialist.  She denies any recent diarrhea. She reports adherence to Lasix 20 mg daily.  She has cut back on her salt intake.  Patient initially went to urgent care for evaluation.  2 view chest x-ray at urgent care (viewable in PACS) showed cardiomegaly with vascular congestion.  She was subsequently sent to the ED for further evaluation.  ED Course:  Initial vitals showed BP 122/66, pulse 78, RR 20, temp 98.2 F, SPO2 94% on room air.  Labs show sodium 134, potassium 3.0, bicarb 30, BUN 13, creatinine 1.16, serum glucose 117, LFTs within normal limits, WBC 8.7, hemoglobin 8.8, platelets 232,000, BNP 487.3.  Troponin 11 > 12.  SARS-CoV-2 PCR is negative.  Influenza is negative.  Patient was given IV Lasix 40 mg and IV K 10 mEq x 3.  The hospitalist service was consulted to admit for further evaluation and  management.  Review of Systems: All systems reviewed and are negative except as documented in history of present illness above.   Past Medical History:  Diagnosis Date   Arthritis    "all over"   Atrial fibrillation [facility])    Diagnosed ~2009   Breast cancer, left breast ([place]) 02/07/1996   S/P chemo and mastectomy    CHF (congestive heart failure) (HCC)    GERD (gastroesophageal reflux disease)    Hyperlipidemia    Hypertension    Left shoulder pain    "MRI showed pinched nerve" - per pt   MI (myocardial infarction) ([place])    OSA on CPAP    Pneumonia    "4-5 times" (01/06/2015)   Type II diabetes mellitus ([place])     Past Surgical History:  Procedure Laterality Date   BREAST BIOPSY Left 1998   CATARACT EXTRACTION W/ INTRAOCULAR LENS  IMPLANT, BILATERAL Bilateral    JOINT REPLACEMENT     LAPAROSCOPIC CHOLECYSTECTOMY     LEFT HEART CATH AND CORONARY ANGIOGRAPHY N/A 07/07/2020   Procedure: LEFT HEART CATH AND CORONARY ANGIOGRAPHY;  Surgeon: Jettie Booze, MD;  Location: [facility];  Service: Cardiovascular;  Laterality: N/A;   MASTECTOMY Left 1998   TOTAL KNEE ARTHROPLASTY Left 2001   TOTAL KNEE REVISION Left 01/28/2013   Procedure: LEFT TOTAL KNEE ARTHROPLASTY REVISION;  Surgeon: Marianna Payment, MD;  Location: [facility];  Service: Orthopedics;  Laterality: Left;    Social History:  reports that she has never smoked. Her smokeless tobacco use  includes snuff. She reports previous alcohol use. She reports that she does not use drugs.  Allergies  Allergen Reactions   Ferumoxytol Itching    Relieved with IV benadryl   Chocolate Other (See Comments)    Cough, sweating    Lisinopril Other (See Comments)    Cough, sweating   Peanuts [Peanut Oil] Cough    Family History  Problem Relation Age of Onset   Heart attack Mother        96s   Emphysema Brother    Allergic rhinitis Neg Hx    Angioedema Neg Hx    Asthma Neg Hx    Eczema Neg Hx    Immunodeficiency  Neg Hx    Urticaria Neg Hx      Prior to Admission medications   Medication Sig Start Date End Date Taking? Authorizing Provider  amLODipine (NORVASC) 10 MG tablet Take 10 mg by mouth. 08/10/16   [provider]  atorvastatin (LIPITOR) 40 MG tablet TAKE 1 TABLET BY MOUTH EVERY DAY 08/23/20   Sherren Mocha, MD  Cholecalciferol (VITAMIN D) 2000 units tablet Take 2,000 Units by mouth daily.    [provider]  FLOVENT HFA 110 MCG/ACT inhaler TAKE 2 PUFFS BY MOUTH TWICE A DAY Patient taking differently: Inhale 2 puffs into the lungs in the morning and at bedtime. 03/21/17   Bobbitt, Sed[place] Muta, MD  furosemide (LASIX) 20 MG tablet Take 1 tablet (20 mg total) by mouth daily. 08/12/20   Loel Dubonnet, NP  gabapentin (NEURONTIN) 100 MG capsule Take 100-200 mg by mouth at bedtime. 06/22/20   [provider]  hydrocortisone (ANUSOL-HC) 2.5 % rectal cream Place 1 application rectally 2 (two) times daily. 08/29/20   Sponseller, Eugene Garnet R, PA-C  metoprolol tartrate (LOPRESSOR) 25 MG tablet Take 1 tablet (25 mg total) by mouth 2 (two) times daily. 08/13/20   Loel Dubonnet, NP  omeprazole (PRILOSEC) 20 MG capsule Take 2 capsules (40 mg total) by mouth daily for 14 days. 07/31/20 08/14/20  Little, Wenda Overland, MD  Rivaroxaban (XARELTO) 15 MG TABS tablet Take 1 tablet (15 mg total) by mouth daily with supper. 07/09/20   Cheryln Manly, NP    Physical Exam: Vitals:   09/11/20 1946 09/11/20 2000 09/11/20 2100 09/11/20 2156  BP: 105/67 116/67 115/63 107/66  Pulse: (!) 53 (!) 57 97 (!) 59  Resp: (!) 27 (!) 27 (!) 21 20  Temp:    98.2 F (36.8 C)  TempSrc:    Oral  SpO2: 97% 99% 100% 97%  Weight:   72.6 kg   Height:   '5\' 4"'$  (1.626 m)    Constitutional: Elderly woman resting in bed, NAD, calm, comfortable Eyes: PERRL, lids and conjunctivae normal ENMT: Mucous membranes are moist. Posterior pharynx clear of any exudate or lesions.Normal dentition.  Neck: normal, supple, no  masses. Respiratory: clear to auscultation bilaterally, no wheezing, no crackles. Normal respiratory effort. No accessory muscle use.  Cardiovascular: Regular rate and rhythm, no murmurs / rubs / gallops.  Trace lower extremity edema. 2+ pedal pulses. Abdomen: no tenderness, no masses palpated. No hepatosplenomegaly. Bowel sounds positive.  Musculoskeletal: no clubbing / cyanosis. No joint deformity upper and lower extremities. Good ROM, no contractures. Normal muscle tone.  Skin: no rashes, lesions, ulcers. No induration Neurologic: CN 2-12 grossly intact. Sensation intact. Strength 5/5 in all 4.  Psychiatric: Normal judgment and insight. Alert and oriented x 3. Normal mood.   Labs on Admission: I have personally reviewed  following labs and imaging studies  CBC: Recent Labs  Lab 09/11/20 1754  WBC 8.7  NEUTROABS 6.2  HGB 8.8*  HCT 27.6*  MCV 83.4  PLT [id number]   Basic Metabolic Panel: Recent Labs  Lab 09/11/20 1754  NA 134*  K 3.0*  CL 95*  CO2 30  GLUCOSE 117*  BUN 13  CREATININE 1.16*  CALCIUM 8.1*   GFR: Estimated Creatinine Clearance: 35.3 mL/min (A) (by C-G formula based on SCr of 1.16 mg/dL (H)). Liver Function Tests: Recent Labs  Lab 09/11/20 1754  AST 23  ALT 9  ALKPHOS 57  BILITOT 0.8  PROT 7.6  ALBUMIN 4.0   No results for input(s): LIPASE, AMYLASE in the last 168 hours. No results for input(s): AMMONIA in the last 168 hours. Coagulation Profile: No results for input(s): INR, PROTIME in the last 168 hours. Cardiac Enzymes: No results for input(s): CKTOTAL, CKMB, CKMBINDEX, TROPONINI in the last 168 hours. BNP (last 3 results) No results for input(s): PROBNP in the last 8760 hours. HbA1C: No results for input(s): HGBA1C in the last 72 hours. CBG: No results for input(s): GLUCAP in the last 168 hours. Lipid Profile: No results for input(s): CHOL, HDL, LDLCALC, TRIG, CHOLHDL, LDLDIRECT in the last 72 hours. Thyroid Function Tests: No results for  input(s): TSH, T4TOTAL, FREET4, T3FREE, THYROIDAB in the last 72 hours. Anemia Panel: No results for input(s): VITAMINB12, FOLATE, FERRITIN, TIBC, IRON, RETICCTPCT in the last 72 hours. Urine analysis:    Component Value Date/Time   COLORURINE YELLOW 08/29/2020 1831   APPEARANCEUR CLEAR 08/29/2020 1831   LABSPEC 1.025 08/29/2020 1831   PHURINE 6.0 08/29/2020 1831   GLUCOSEU NEGATIVE 08/29/2020 1831   HGBUR NEGATIVE 08/29/2020 1831   BILIRUBINUR NEGATIVE 08/29/2020 [place] 08/29/2020 1831   PROTEINUR >300 (A) 08/29/2020 1831   UROBILINOGEN 1.0 12/03/2014 1059   NITRITE NEGATIVE 08/29/2020 1831   LEUKOCYTESUR NEGATIVE 08/29/2020 1831    Radiological Exams on Admission: No results found.  EKG: Personally reviewed. Atrial fibrillation, rate 77, QTc 517, low voltage.  QTc more prolonged when compared to prior.  Assessment/Plan Principal Problem:   Acute on chronic diastolic CHF (congestive heart failure) (HCC) Active Problems:   Hypertension   Persistent atrial fibrillation (HCC)   HLD (hyperlipidemia)   Type II diabetes mellitus (HCC)   OSA on CPAP   CKD (chronic kidney disease), stage III (HCC)   Anemia of chronic disease   Hypokalemia   [name] is a 88 y.o. female with medical history significant for persistent atrial fibrillation on Xarelto, chronic diastolic CHF (last EF 0000000), nonobstructive CAD, CKD stage IIIa, diet-controlled T2DM, HTN, HLD, left breast cancer s/p chemotherapy and mastectomy, and OSA on CPAP who is admitted with acute on chronic diastolic CHF.  Acute on chronic diastolic CHF: Patient presenting with DOE, orthopnea, elevated BNP.  CXR at urgent care shows pulmonary vascular congestion.  EF 55-60% by TTE 07/04/2020.  Currently saturating well on room air at rest, was reported to desaturate to 89% with activity in the ED. -Continue IV Lasix 40 mg twice daily -Supplement potassium -Strict I/O's and daily weights -Supplemental  oxygen if needed  Persistent atrial fibrillation: Remains in atrial fibrillation with controlled rate.  Continue Lopressor and Xarelto.  Hypokalemia: Supplement.  Check magnesium and supplement if needed.  CKD stage IIIa: Renal function at baseline.  Continue to monitor with diuresis.  Hypertension: Continue Lopressor, holding amlodipine with borderline soft blood pressure.  CAD: Nonobstructive CAD noted  on left heart cath 07/07/2020.  Not on aspirin since she is on Xarelto.  Denies chest pain.  Continue statin.  Anemia of chronic disease: Hemoglobin at baseline.  Patient denies any obvious bleeding.  Diet controlled type 2 diabetes: Last A1c 6.0% 07/05/2020.  Continue to monitor.  Asthma: Stable without active wheezing.  Continue Pulmicort and as needed albuterol.  Hyperlipidemia: Continue atorvastatin.  OSA: Continue CPAP nightly.  DVT prophylaxis: Xarelto Code Status: Full code, confirmed with patient on admission Family Communication: Discussed with patient's daughter at bedside Disposition Plan: From home and likely discharge to home pending clinical progress Consults called: None Level of care: Telemetry Admission status:  Status is: Observation  The patient remains OBS appropriate and will d/c before 2 midnights.  Dispo: The patient is from: Home              Anticipated d/c is to: Home              Patient currently is not medically stable to d/c.   Difficult to place patient No   Zada Finders MD [facility]  If 7PM-7AM, please contact night-coverage [email or web address]  09/11/2020, 10:34 PM

## 2020-09-12 DIAGNOSIS — F17290 Nicotine dependence, other tobacco product, uncomplicated: Secondary | ICD-10-CM | POA: Diagnosis present

## 2020-09-12 DIAGNOSIS — D631 Anemia in chronic kidney disease: Secondary | ICD-10-CM | POA: Diagnosis present

## 2020-09-12 DIAGNOSIS — Z9989 Dependence on other enabling machines and devices: Secondary | ICD-10-CM

## 2020-09-12 DIAGNOSIS — I252 Old myocardial infarction: Secondary | ICD-10-CM | POA: Diagnosis not present

## 2020-09-12 DIAGNOSIS — E785 Hyperlipidemia, unspecified: Secondary | ICD-10-CM

## 2020-09-12 DIAGNOSIS — I5033 Acute on chronic diastolic (congestive) heart failure: Secondary | ICD-10-CM | POA: Diagnosis present

## 2020-09-12 DIAGNOSIS — Z8249 Family history of ischemic heart disease and other diseases of the circulatory system: Secondary | ICD-10-CM | POA: Diagnosis not present

## 2020-09-12 DIAGNOSIS — E1122 Type 2 diabetes mellitus with diabetic chronic kidney disease: Secondary | ICD-10-CM | POA: Diagnosis present

## 2020-09-12 DIAGNOSIS — Z96652 Presence of left artificial knee joint: Secondary | ICD-10-CM | POA: Diagnosis present

## 2020-09-12 DIAGNOSIS — I509 Heart failure, unspecified: Secondary | ICD-10-CM

## 2020-09-12 DIAGNOSIS — N1831 Chronic kidney disease, stage 3a: Secondary | ICD-10-CM | POA: Diagnosis present

## 2020-09-12 DIAGNOSIS — Z853 Personal history of malignant neoplasm of breast: Secondary | ICD-10-CM | POA: Diagnosis not present

## 2020-09-12 DIAGNOSIS — I130 Hypertensive heart and chronic kidney disease with heart failure and stage 1 through stage 4 chronic kidney disease, or unspecified chronic kidney disease: Secondary | ICD-10-CM | POA: Diagnosis present

## 2020-09-12 DIAGNOSIS — Z7901 Long term (current) use of anticoagulants: Secondary | ICD-10-CM | POA: Diagnosis not present

## 2020-09-12 DIAGNOSIS — I10 Essential (primary) hypertension: Secondary | ICD-10-CM

## 2020-09-12 DIAGNOSIS — Z825 Family history of asthma and other chronic lower respiratory diseases: Secondary | ICD-10-CM | POA: Diagnosis not present

## 2020-09-12 DIAGNOSIS — I4819 Other persistent atrial fibrillation: Secondary | ICD-10-CM | POA: Diagnosis present

## 2020-09-12 DIAGNOSIS — G4733 Obstructive sleep apnea (adult) (pediatric): Secondary | ICD-10-CM | POA: Diagnosis present

## 2020-09-12 DIAGNOSIS — T502X5A Adverse effect of carbonic-anhydrase inhibitors, benzothiadiazides and other diuretics, initial encounter: Secondary | ICD-10-CM | POA: Diagnosis present

## 2020-09-12 DIAGNOSIS — D638 Anemia in other chronic diseases classified elsewhere: Secondary | ICD-10-CM | POA: Diagnosis not present

## 2020-09-12 DIAGNOSIS — Z9012 Acquired absence of left breast and nipple: Secondary | ICD-10-CM | POA: Diagnosis not present

## 2020-09-12 DIAGNOSIS — E876 Hypokalemia: Secondary | ICD-10-CM

## 2020-09-12 DIAGNOSIS — Z79899 Other long term (current) drug therapy: Secondary | ICD-10-CM | POA: Diagnosis not present

## 2020-09-12 DIAGNOSIS — Z20822 Contact with and (suspected) exposure to covid-19: Secondary | ICD-10-CM | POA: Diagnosis present

## 2020-09-12 DIAGNOSIS — Z9221 Personal history of antineoplastic chemotherapy: Secondary | ICD-10-CM | POA: Diagnosis not present

## 2020-09-12 DIAGNOSIS — J4540 Moderate persistent asthma, uncomplicated: Secondary | ICD-10-CM | POA: Diagnosis present

## 2020-09-12 DIAGNOSIS — I2510 Atherosclerotic heart disease of native coronary artery without angina pectoris: Secondary | ICD-10-CM | POA: Diagnosis present

## 2020-09-12 LAB — GLUCOSE, CAPILLARY
Glucose-Capillary: 65 mg/dL — ABNORMAL LOW (ref 70–99)
Glucose-Capillary: 77 mg/dL (ref 70–99)
Glucose-Capillary: 112 mg/dL — ABNORMAL HIGH (ref 70–99)
Glucose-Capillary: 83 mg/dL (ref 70–99)
Glucose-Capillary: 118 mg/dL — ABNORMAL HIGH (ref 70–99)

## 2020-09-12 LAB — BASIC METABOLIC PANEL
Anion gap: 7 (ref 5–15)
BUN: 14 mg/dL (ref 8–23)
CO2: 30 mmol/L (ref 22–32)
Calcium: 8 mg/dL — ABNORMAL LOW (ref 8.9–10.3)
Chloride: 99 mmol/L (ref 98–111)
Creatinine, Ser: 1.02 mg/dL — ABNORMAL HIGH (ref 0.44–1.00)
GFR, Estimated: 54 mL/min — ABNORMAL LOW (ref >60)
Glucose, Bld: 84 mg/dL (ref 70–99)
Potassium: 2.9 mmol/L — ABNORMAL LOW (ref 3.5–5.1)
Sodium: 136 mmol/L (ref 135–145)

## 2020-09-12 LAB — CBC
HCT: 26.1 % — ABNORMAL LOW (ref 36.0–46.0)
Hemoglobin: 8.1 g/dL — ABNORMAL LOW (ref 12.0–15.0)
MCH: 26.5 pg (ref 26.0–34.0)
MCHC: 31 g/dL (ref 30.0–36.0)
MCV: 85.3 fL (ref 80.0–100.0)
Platelets: 208 10*3/uL (ref 150–400)
RBC: 3.06 MIL/uL — ABNORMAL LOW (ref 3.87–5.11)
RDW: 17.8 % — ABNORMAL HIGH (ref 11.5–15.5)
WBC: 7 10*3/uL (ref 4.0–10.5)
nRBC: 0 % (ref 0.0–0.2)

## 2020-09-12 LAB — HEMOGLOBIN A1C
Hgb A1c MFr Bld: 5.9 % — ABNORMAL HIGH (ref 4.8–5.6)
Mean Plasma Glucose: 122.63 mg/dL

## 2020-09-12 LAB — MAGNESIUM: Magnesium: 1.6 mg/dL — ABNORMAL LOW (ref 1.7–2.4)

## 2020-09-12 MED ORDER — FUROSEMIDE 20 MG PO TABS
20.0000 mg | ORAL_TABLET | Freq: Every day | ORAL | Status: DC
  Administered 2020-09-13: 20 mg via ORAL
  Filled 2020-09-12: qty 1

## 2020-09-12 MED ORDER — VITAMIN D 25 MCG (1000 UNIT) PO TABS
2000.0000 [IU] | ORAL_TABLET | Freq: Every day | ORAL | Status: DC
  Administered 2020-09-12 – 2020-09-13 (×2): 2000 [IU] via ORAL
  Filled 2020-09-12 (×2): qty 2

## 2020-09-12 MED ORDER — POTASSIUM CHLORIDE CRYS ER 20 MEQ PO TBCR: 40.0000 meq | EXTENDED_RELEASE_TABLET | ORAL | Status: DC

## 2020-09-12 MED ORDER — POTASSIUM CHLORIDE 20 MEQ PO PACK
20.0000 meq | PACK | Freq: Every day | ORAL | Status: DC
  Administered 2020-09-13: 20 meq via ORAL
  Filled 2020-09-12: qty 1

## 2020-09-12 MED ORDER — FUROSEMIDE 10 MG/ML IJ SOLN
20.0000 mg | Freq: Once | INTRAMUSCULAR | Status: AC
  Administered 2020-09-12: 20 mg via INTRAVENOUS
  Filled 2020-09-12: qty 2

## 2020-09-12 MED ORDER — FUROSEMIDE 10 MG/ML IJ SOLN
20.0000 mg | Freq: Two times a day (BID) | INTRAMUSCULAR | Status: DC
  Filled 2020-09-12: qty 2

## 2020-09-12 MED ORDER — ORAL CARE MOUTH RINSE
15.0000 mL | Freq: Two times a day (BID) | OROMUCOSAL | Status: DC
  Administered 2020-09-12: 15 mL via OROMUCOSAL

## 2020-09-12 MED ORDER — INSULIN ASPART 100 UNIT/ML IJ SOLN: 0.0000 [IU] | Freq: Three times a day (TID) | INTRAMUSCULAR | Status: DC

## 2020-09-12 MED ORDER — PANTOPRAZOLE SODIUM 40 MG PO TBEC
40.0000 mg | DELAYED_RELEASE_TABLET | Freq: Every day | ORAL | Status: DC
  Administered 2020-09-12 – 2020-09-13 (×2): 40 mg via ORAL
  Filled 2020-09-12 (×2): qty 1

## 2020-09-12 MED ORDER — HYDROCORTISONE (PERIANAL) 2.5 % EX CREA
1.0000 "application " | TOPICAL_CREAM | Freq: Two times a day (BID) | CUTANEOUS | Status: DC
  Administered 2020-09-12 (×2): 1 via RECTAL
  Filled 2020-09-12: qty 28.35

## 2020-09-12 MED ORDER — CHLORHEXIDINE GLUCONATE 0.12 % MT SOLN
15.0000 mL | Freq: Two times a day (BID) | OROMUCOSAL | Status: DC
  Administered 2020-09-12 – 2020-09-13 (×3): 15 mL via OROMUCOSAL
  Filled 2020-09-12 (×3): qty 15

## 2020-09-12 MED ORDER — MAGNESIUM SULFATE 4 GM/100ML IV SOLN
4.0000 g | Freq: Once | INTRAVENOUS | Status: AC
  Administered 2020-09-12: 4 g via INTRAVENOUS
  Filled 2020-09-12: qty 100

## 2020-09-12 MED ORDER — POTASSIUM CHLORIDE 20 MEQ PO PACK
40.0000 meq | PACK | ORAL | Status: AC
  Administered 2020-09-12 (×2): 40 meq via ORAL
  Filled 2020-09-12 (×2): qty 2

## 2020-09-12 NOTE — Plan of Care (Signed)

## 2020-09-12 NOTE — Progress Notes (Signed)
O2 saturation while ambulating in room = 94%. Patient denies shortness of breath.   [name]. Brigitte Pulse, RN

## 2020-09-12 NOTE — Progress Notes (Signed)
PROGRESS NOTE    [name]  [id number] DOB: 25-Oct-1933 DOA: 09/11/2020 PCP: Elisabeth Cara, PA-C    Chief Complaint  Patient presents with   Shortness of Breath    Brief Narrative:  Patient pleasant 87 year old female history of persistent A. fib on Xarelto, chronic diastolic CHF, nonobstructive CAD, CKD stage IIIa, diet-controlled type 2 diabetes, hypertension, hyperlipidemia, history of left breast cancer status post chemo and mastectomy, OSA on CPAP presented to the ED with worsening shortness of breath noted to be in acute CHF exacerbation.  Patient admitted placed on IV Lasix.   Assessment & Plan:   Principal Problem:   Acute on chronic diastolic CHF (congestive heart failure) (HCC) Active Problems:   Hypertension   Persistent atrial fibrillation (HCC)   HLD (hyperlipidemia)   Type II diabetes mellitus (HCC)   OSA on CPAP   CKD (chronic kidney disease), stage III (HCC)   Anemia of chronic disease   Hypokalemia   CHF exacerbation (HCC)   Hypomagnesemia  #1 acute on chronic diastolic CHF exacerbation -Patient presented with shortness of breath on exertion, orthopnea, elevated BNP. -Chest x-ray per admitting physician in urgent care concerning for pulmonary vascular congestion. -Patient with 2D echo from 07/04/2020 with a EF of 55 to 60%. -It was reported on admission that patient desatted to 89% with activity in the ED. -Patient placed on Lasix 40 mg IV every 12 hours with urine output of 850 cc over the past 24 hours. -Patient improving clinically. -Currently with sats of 96% on room air. -Lasix to be decreased to 20 mg IV every 12 hours however Lasix this morning was held due to systolic blood pressures in the mid to low 90s. -We will hold IV Lasix through today and resume oral Lasix at home dose of 20 mg daily starting tomorrow. -Hold morning dose of Lopressor. -Follow-up.  2.  Persistent A. fib -Systolic blood pressure in the low 90s and as such  we will hold morning dose of Lopressor. -Xarelto for anticoagulation.  3.  Hyperlipidemia -Continue statin.  4.  Hypokalemia/hypomagnesemia -Secondary to diuresis -K-Dur 40 mEq p.o. every 4 hours x2 doses. -Resume home regimen oral daily potassium supplementation tomorrow. -Magnesium sulfate 4 g IV x1.  5.  CKD stage IIIa -Renal function stable.  Follow.  6.  Hypertension -Systolic blood pressure in the low to mid 90s this morning. -Continue to hold Norvasc. -Hold this morning's dose of Lopressor. -Follow.  7.  Nonobstructive coronary artery disease -Continue Xarelto.  Continue statin.  Continue beta-blocker.  8.  Anemia of chronic disease -Patient with no overt bleeding. -Hemoglobin stable at 8.1.  9.  OSA -CPAP nightly.  10.  Well-controlled diabetes mellitus type 2 -Diet controlled. -Hemoglobin A1c 5.9 -CBG 77 this morning. -SSI.   DVT prophylaxis: Xarelto Code Status: Full Family Communication: Updated patient.  No family at bedside. Disposition:   Status is: Inpatient  The patient will require care spanning > 2 midnights and should be moved to inpatient because: Inpatient level of care appropriate due to severity of illness  Dispo: The patient is from: Home              Anticipated d/c is to: Home              Patient currently is not medically stable to d/c.   Difficult to place patient No       Consultants:  None  Procedures:  None  Antimicrobials:  None   Subjective: Patient laying in  bed.  States shortness of breath is improved.  States she is feeling better.  Denies any chest pain.  No abdominal pain.  Objective: Vitals:   09/12/20 0711 09/12/20 0845 09/12/20 0849 09/12/20 1213  BP:  (!) 90/58 92/60 102/77  Pulse:    96  Resp:    17  Temp:    98 F (36.7 C)  TempSrc:    Oral  SpO2: 96%   98%  Weight:      Height:        Intake/Output Summary (Last 24 hours) at 09/12/2020 1654 Last data filed at 09/12/2020 1543 Gross per 24  hour  Intake 1058.29 ml  Output 1200 ml  Net -141.71 ml   Filed Weights   09/11/20 1716 09/11/20 2100 09/12/20 0500  Weight: 68.9 kg 72.6 kg 69.4 kg    Examination:  General exam: Appears calm and comfortable  Respiratory system: Decreased breath sounds in the bases.  Fair air movement.  No wheezing.  No crackles.  Speaking in full sentences. Cardiovascular system: Irregularly irregular.  No JVD, no murmurs rubs or gallops.  No lower extremity edema.  Gastrointestinal system: Abdomen is nondistended, soft and nontender. No organomegaly or masses felt. Normal bowel sounds heard. Central nervous system: Alert and oriented. No focal neurological deficits. Extremities: Symmetric 5 x 5 power. Skin: No rashes, lesions or ulcers Psychiatry: Judgement and insight appear normal. Mood & affect appropriate.     Data Reviewed: I have personally reviewed following labs and imaging studies  CBC: Recent Labs  Lab 09/11/20 1754 09/12/20 0502  WBC 8.7 7.0  NEUTROABS 6.2  --   HGB 8.8* 8.1*  HCT 27.6* 26.1*  MCV 83.4 85.3  PLT 232 [id number]    Basic Metabolic Panel: Recent Labs  Lab 09/11/20 1754 09/12/20 0502  NA 134* 136  K 3.0* 2.9*  CL 95* 99  CO2 30 30  GLUCOSE 117* 84  BUN 13 14  CREATININE 1.16* 1.02*  CALCIUM 8.1* 8.0*  MG  --  1.6*    GFR: Estimated Creatinine Clearance: 39.3 mL/min (A) (by C-G formula based on SCr of 1.02 mg/dL (H)).  Liver Function Tests: Recent Labs  Lab 09/11/20 1754  AST 23  ALT 9  ALKPHOS 57  BILITOT 0.8  PROT 7.6  ALBUMIN 4.0    CBG: Recent Labs  Lab 09/12/20 0733 09/12/20 0755 09/12/20 1145  GLUCAP 65* 77 112*     Recent Results (from the past 240 hour(s))  Resp Panel by RT-PCR (Flu A&B, Covid) Nasopharyngeal Swab     Status: None   Collection Time: 09/11/20  7:44 PM   Specimen: Nasopharyngeal Swab; Nasopharyngeal(NP) swabs in vial transport medium  Result Value Ref Range Status   SARS Coronavirus 2 by RT PCR NEGATIVE  NEGATIVE Final    Comment: (NOTE) SARS-CoV-2 target nucleic acids are NOT DETECTED.  The SARS-CoV-2 RNA is generally detectable in upper respiratory specimens during the acute phase of infection. The lowest concentration of SARS-CoV-2 viral copies this assay can detect is 138 copies/mL. A negative result does not preclude SARS-Cov-2 infection and should not be used as the sole basis for treatment or other patient management decisions. A negative result may occur with  improper specimen collection/handling, submission of specimen other than nasopharyngeal swab, presence of viral mutation(s) within the areas targeted by this assay, and inadequate number of viral copies(<138 copies/mL). A negative result must be combined with clinical observations, patient history, and epidemiological information. The expected result is Negative.  Fact Sheet for Patients:  EntrepreneurPulse.com.au  Fact Sheet for Healthcare Providers:  IncredibleEmployment.be  This test is no t yet approved or cleared by the [place] FDA and  has been authorized for detection and/or diagnosis of SARS-CoV-2 by FDA under an Emergency Use Authorization (EUA). This EUA will remain  in effect (meaning this test can be used) for the duration of the COVID-19 declaration under Section 564(b)(1) of the Act, 21 U.S.C.section 360bbb-3(b)(1), unless the authorization is terminated  or revoked sooner.       Influenza A by PCR NEGATIVE NEGATIVE Final   Influenza B by PCR NEGATIVE NEGATIVE Final    Comment: (NOTE) The Xpert Xpress SARS-CoV-2/FLU/RSV plus assay is intended as an aid in the diagnosis of influenza from Nasopharyngeal swab specimens and should not be used as a sole basis for treatment. Nasal washings and aspirates are unacceptable for Xpert Xpress SARS-CoV-2/FLU/RSV testing.  Fact Sheet for Patients: EntrepreneurPulse.com.au  Fact Sheet for Healthcare  Providers: IncredibleEmployment.be  This test is not yet approved or cleared by the [place] FDA and has been authorized for detection and/or diagnosis of SARS-CoV-2 by FDA under an Emergency Use Authorization (EUA). This EUA will remain in effect (meaning this test can be used) for the duration of the COVID-19 declaration under Section 564(b)(1) of the Act, 21 U.S.C. section 360bbb-3(b)(1), unless the authorization is terminated or revoked.  Performed at [facility], [address], [place], [place] [address]          Radiology Studies: No results found.      Scheduled Meds:  atorvastatin  40 mg Oral Daily   budesonide  0.25 mg Nebulization BID   chlorhexidine  15 mL Mouth Rinse BID   cholecalciferol  2,000 Units Oral Daily   [START ON 09/13/2020] furosemide  20 mg Oral Daily   gabapentin  100-200 mg Oral QHS   hydrocortisone  1 application Rectal BID   insulin aspart  0-6 Units Subcutaneous TID WC   mouth rinse  15 mL Mouth Rinse q12n4p   metoprolol tartrate  25 mg Oral BID   pantoprazole  40 mg Oral Q0600   [START ON 09/13/2020] potassium chloride  20 mEq Oral Daily   potassium chloride  40 mEq Oral Q4H   Rivaroxaban  15 mg Oral Q supper   sodium chloride flush  3 mL Intravenous Q12H   Continuous Infusions:   LOS: 0 days    Time spent: 40 minutes    Irine Seal, MD [facility]   To contact the attending provider between 7A-7P or the covering provider during after hours 7P-7A, please log into the web site [email or web address] and access using universal [place] password for that web site. If you do not have the password, please call the hospital operator.  09/12/2020, 4:54 PM

## 2020-09-12 NOTE — Progress Notes (Signed)
Patient had metoprolol and furosemide around the same time. Nurse rechecked patient's HR and BP, which were BP 100/62 and HR 63, prior to giving the medications. Notified on call provider about this, and the on call provider told nurse to go ahead and give the metoprolol, recheck BP in an hour, and if the systolic BP is greater than 100 to give the furosemide.

## 2020-09-12 NOTE — Progress Notes (Signed)
Placed pt on cpap auto setting with ffm as pt wears at home

## 2020-09-12 NOTE — Plan of Care (Signed)

## 2020-09-13 LAB — CBC
HCT: 27.1 % — ABNORMAL LOW (ref 36.0–46.0)
Hemoglobin: 8.4 g/dL — ABNORMAL LOW (ref 12.0–15.0)
MCH: 26.6 pg (ref 26.0–34.0)
MCHC: 31 g/dL (ref 30.0–36.0)
MCV: 85.8 fL (ref 80.0–100.0)
Platelets: 215 10*3/uL (ref 150–400)
RBC: 3.16 MIL/uL — ABNORMAL LOW (ref 3.87–5.11)
RDW: 17.5 % — ABNORMAL HIGH (ref 11.5–15.5)
WBC: 6.9 10*3/uL (ref 4.0–10.5)
nRBC: 0 % (ref 0.0–0.2)

## 2020-09-13 LAB — BASIC METABOLIC PANEL
Anion gap: 11 (ref 5–15)
BUN: 16 mg/dL (ref 8–23)
CO2: 26 mmol/L (ref 22–32)
Calcium: 8.9 mg/dL (ref 8.9–10.3)
Chloride: 99 mmol/L (ref 98–111)
Creatinine, Ser: 1.15 mg/dL — ABNORMAL HIGH (ref 0.44–1.00)
GFR, Estimated: 47 mL/min — ABNORMAL LOW (ref >60)
Glucose, Bld: 94 mg/dL (ref 70–99)
Potassium: 4.4 mmol/L (ref 3.5–5.1)
Sodium: 136 mmol/L (ref 135–145)

## 2020-09-13 LAB — MAGNESIUM: Magnesium: 2.2 mg/dL (ref 1.7–2.4)

## 2020-09-13 LAB — GLUCOSE, CAPILLARY
Glucose-Capillary: 80 mg/dL (ref 70–99)
Glucose-Capillary: 67 mg/dL — ABNORMAL LOW (ref 70–99)

## 2020-09-13 MED ORDER — ZINC OXIDE 40 % EX OINT
TOPICAL_OINTMENT | CUTANEOUS | Status: DC | PRN
  Filled 2020-09-13: qty 57

## 2020-09-13 MED ORDER — ZINC OXIDE 40 % EX OINT: TOPICAL_OINTMENT | CUTANEOUS | 0 refills | Status: DC | PRN

## 2020-09-13 NOTE — Evaluation (Signed)
Physical Therapy Evaluation Patient Details Name: [name] MRN: [id number] DOB: Aug 28, 1933 Today's Date: 09/13/2020   SATURATION QUALIFICATIONS: (This note is used to comply with regulatory documentation for home oxygen)  Patient Saturations on Room Air at Rest = 96%  Patient Saturations on Room Air while Ambulating = 92%     History of Present Illness  87 yo female admitted with CHF. Hx of L TKA rev 2014, CHF, Afib, CAD, anemia, breast ca-s/p mastetomy, CKD  Clinical Impression  On eval, pt required Min guard -Min A for mobility. She walked ~200 feet with intermittent use of hallway handrail (pt uses cane at baseline PRN). Pt tolerated distance well. Discussed d/c plan-she will return home where she lives with her daughter. She is agreeable to HHPT for balance training.     Follow Up Recommendations Home health PT    Equipment Recommendations  None recommended by PT    Recommendations for Other Services       Precautions / Restrictions Precautions Precautions: Fall Restrictions Weight Bearing Restrictions: No      Mobility  Bed Mobility               General bed mobility comments: oob in recliner    Transfers Overall transfer level: Needs assistance Equipment used: None Transfers: Sit to/from Stand Sit to Stand: Min guard         General transfer comment: Min guard for safety. Mildly unsteady.  Ambulation/Gait Ambulation/Gait assistance: Min assist;Min guard Gait Distance (Feet): 200 Feet Assistive device: None (vs hallway handrail) Gait Pattern/deviations: Step-through pattern;Decreased stride length     General Gait Details: Unsteady. Intermittent assist to steady. Pt tolerated distance well. O2 92% on RA  Stairs            Wheelchair Mobility    Modified Rankin (Stroke Patients Only)       Balance Overall balance assessment: Needs assistance         Standing balance support: Single extremity supported;No upper  extremity supported;During functional activity Standing balance-Leahy Scale: Fair                               Pertinent Vitals/Pain Pain Assessment: No/denies pain    Home Living Family/patient expects to be discharged to:: Private residence Living Arrangements: Children Available Help at Discharge: Family Type of Home: House         Home Equipment: [name] - single point;Walker - 2 wheels;Shower seat      Prior Function Level of Independence: Independent with assistive device(s)         Comments: uses cane for ambulation     Hand Dominance        Extremity/Trunk Assessment   Upper Extremity Assessment Upper Extremity Assessment: Defer to OT evaluation    Lower Extremity Assessment Lower Extremity Assessment: Generalized weakness    Cervical / Trunk Assessment Cervical / Trunk Assessment: Normal  Communication   Communication: No difficulties  Cognition Arousal/Alertness: Awake/alert Behavior During Therapy: WFL for tasks assessed/performed Overall Cognitive Status: Within Functional Limits for tasks assessed                                        General Comments      Exercises     Assessment/Plan    PT Assessment Patient needs continued PT services  PT Problem List Decreased  mobility;Decreased balance;Decreased activity tolerance       PT Treatment Interventions Gait training;DME instruction;Therapeutic exercise;Balance training;Functional mobility training;Therapeutic activities;Patient/family education    PT Goals (Current goals can be found in the Care Plan section)  Acute Rehab PT Goals Patient Stated Goal: home. better balance PT Goal Formulation: With patient Time For Goal Achievement: 09/27/20 Potential to Achieve Goals: Good    Frequency Min 3X/week   Barriers to discharge        Co-evaluation               AM-PAC PT "6 Clicks" Mobility  Outcome Measure Help needed turning from your back to  your side while in a flat bed without using bedrails?: None Help needed moving from lying on your back to sitting on the side of a flat bed without using bedrails?: None Help needed moving to and from a bed to a chair (including a wheelchair)?: A Little Help needed standing up from a chair using your arms (e.g., wheelchair or bedside chair)?: A Little Help needed to walk in hospital room?: A Little Help needed climbing 3-5 steps with a railing? : A Little 6 Click Score: 20    End of Session Equipment Utilized During Treatment: Gait belt Activity Tolerance: Patient tolerated treatment well Patient left: in chair;with call bell/phone within reach;with chair alarm set   PT Visit Diagnosis: Unsteadiness on feet (R26.81)    Time: [id number] PT Time Calculation (min) (ACUTE ONLY): 11 min   Charges:   PT Evaluation $PT Eval Moderate Complexity: 1 Mod             Doreatha Massed, PT Acute Rehabilitation  Office: [phone] Pager: [phone]

## 2020-09-13 NOTE — Progress Notes (Signed)
Nutrition Brief Note  Patient identified on the Malnutrition Screening Tool (MST) Report.  Wt Readings from Last 15 Encounters:  09/13/20 68.7 kg  08/29/20 70.1 kg  08/14/20 65.8 kg  08/12/20 69.2 kg  07/31/20 63.5 kg  07/27/20 68 kg  07/03/20 68 kg  05/08/20 67.6 kg  03/07/20 68 kg  02/05/19 72.6 kg  09/13/18 73.7 kg  06/22/18 69.4 kg  01/27/18 69.4 kg  09/07/16 70.4 kg  01/08/15 69.7 kg    Body mass index is 26 kg/m. Patient meets criteria for overweight status based on current BMI. Weight today is 151 lb and weight has been mainly stable over the past 7 months.   Mild pitting edema to BLE documented in the edema section of flow sheet. Skin noted to be WDL.   Current diet order is Heart Healthy/Carb Modified. She ate 50% of breakfast and 50% of lunch yesterday.   Labs and medications reviewed.   Patient was last seen by a [place] RD in June at which time patient was drinking 1-2 bottles of Ensure/day and reported enjoying the supplement and planning to drink after discharge.   Discharge order placed a short time ago for d/c to home; no d/c summary yet.    Information placed in Discharge Instructions for oral nutrition supplement consumption at home following d/c.  No additional nutrition interventions warranted at this time. If nutrition issues arise, please consult RD.      Jarome Matin, MS, RD, LDN, CNSC Inpatient Clinical Dietitian RD pager # available in [place]  After hours/weekend pager # available in [facility]

## 2020-09-13 NOTE — Discharge Instructions (Signed)
[place] [facility] Stay Proper nutrition can help your body recover from illness and injury.   Foods and beverages high in protein, vitamins, and minerals help rebuild muscle loss, promote healing, & reduce fall risk.   In addition to eating healthy foods, a nutrition shake is an easy, delicious way to get the nutrition you need during and after your hospital stay  It is recommended that you continue to drink 2 bottles per day of:       Ensure/Ensure Plus  for at least 1 month (30 days) after your hospital stay   Tips for adding a nutrition shake into your routine: As allowed, drink one with vitamins or medications instead of water or juice Enjoy one as a tasty mid-morning or afternoon snack Drink cold or make a milkshake out of it Drink one instead of milk with cereal or snacks Use as a coffee creamer   Available at the following grocery stores and pharmacies:           * [place] [phone]            For COUPONS visit: www.ensure.com/join or http://dawson-may.com/   Suggested Substitutions Ensure Plus = Boost Plus = Carnation Breakfast Essentials = Boost Compact Ensure Active Clear = Boost Breeze Glucerna Shake = Boost Glucose Control = Carnation Breakfast Essentials SUGAR FREE

## 2020-09-13 NOTE — Progress Notes (Signed)
Patient had to get up to go the bathroom, and when wiping saw blood on toilet paper. Nurse assessed patient to find that patient's labia folds were raw, irritated, and bleeding. Nurse reached out to on call provider about this and on call provider gave order for Desitin cream.

## 2020-09-13 NOTE — Discharge Summary (Signed)
Physician Discharge Summary  [name] [id number] DOB: [birth year] DOA: 09/11/2020  PCP: Elisabeth Cara, PA-C  Admit date: 09/11/2020 Discharge date: 09/13/2020  Admitted From: home Discharge disposition: home   Recommendations for Outpatient Follow-Up:   Daily weights BMP 1 week Home health Norvasc d/c'd to allow for room in BP for diuresis   Discharge Diagnosis:   Principal Problem:   Acute on chronic diastolic CHF (congestive heart failure) ([place]) Active Problems:   Hypertension   Persistent atrial fibrillation ([place])   HLD (hyperlipidemia)   Type II diabetes mellitus ([place])   OSA on CPAP   CKD (chronic kidney disease), stage III (HCC)   Anemia of chronic disease   Hypokalemia   CHF exacerbation ([place])   Hypomagnesemia    Discharge Condition: Improved.  Diet recommendation: Low sodium, heart healthy.  Carbohydrate-modified.  Wound care: None.  Code status: Full.   History of Present Illness:   [name] is a 87 y.o. female with medical history significant for persistent atrial fibrillation on Xarelto, chronic diastolic CHF (last EF 0000000 07/04/2020), nonobstructive CAD, CKD stage IIIa, diet-controlled T2DM, HTN, HLD, left breast cancer s/p chemotherapy and mastectomy, and OSA on CPAP who presented to the ED for evaluation of dyspnea.   Patient reports about 1 week of progressive shortness of breath, worse with exertion.  She has had increased orthopnea from baseline.  She has also seen some swelling in her legs.  She has associated cough sometimes productive of clear sputum.  She has not had any chest pain.  She reports good urine output.  She has not had any nausea or vomiting.  She reports chronic abdominal discomfort which is being evaluated by her GI specialist.  She denies any recent diarrhea. She reports adherence to Lasix 20 mg daily.  She has cut back on her salt intake.  Patient initially went to urgent care for evaluation.  2  view chest x-ray at urgent care (viewable in PACS) showed cardiomegaly with vascular congestion.  She was subsequently sent to the ED for further evaluation   Hospital Course by Problem:   #1 acute on chronic diastolic CHF exacerbation -Patient presented with shortness of breath on exertion, orthopnea, elevated BNP. -Chest x-ray per admitting physician in urgent care concerning for pulmonary vascular congestion. -Patient with 2D echo from 07/04/2020 with a EF of 55 to 60%. -Patient placed on Lasix 40 mg IV every 12 hours with urine output of 850 cc over the past 24 hours. -Currently with sats of 96% on room air. -resume home lasix -encouraged patient to weight daily and be compliant with diet  2.  Persistent A. fib -Xarelto for anticoagulation.  3.  Hyperlipidemia -Continue statin.  4.  Hypokalemia/hypomagnesemia -replete  5.  CKD stage IIIa -Renal function stable.  Follow.  6.  Hypertension -d/c norvasc  7.  Nonobstructive coronary artery disease -Continue Xarelto.  Continue statin.  Continue beta-blocker.  8.  Anemia of chronic disease -Patient with no overt bleeding. -Hemoglobin stable at 8.1.  9.  OSA -CPAP nightly.  10.  Well-controlled diabetes mellitus type 2 -Diet controlled. -Hemoglobin A1c 5.9     Medical Consultants:      Discharge Exam:   Vitals:   09/13/20 1152 09/13/20 1209  BP: 123/73   Pulse: 67   Resp: 18   Temp: 98.2 F (36.8 C)     96%   Vitals:   09/13/20 0816 09/13/20 0841 09/13/20 1152 09/13/20 1209  BP: 117/68  123/73   Pulse: 67  67   Resp:   18   Temp:   98.2 F (36.8 C)   TempSrc:   Oral   SpO2:  96% ( 96%  Weight:      Height:        General exam: Appears calm and comfortable.    The results of significant diagnostics from this hospitalization (including imaging, microbiology, ancillary and laboratory) are listed below for reference.     Procedures and Diagnostic Studies:   No results found.   Labs:    Basic Metabolic Panel: Recent Labs  Lab 09/11/20 1754 09/12/20 0502 09/13/20 0409  NA 134* 136 136  K 3.0* 2.9* 4.4  CL 95* 99 99  CO2 '30 30 26  '$ GLUCOSE 117* 84 94  BUN '13 14 16  '$ CREATININE 1.16* 1.02* 1.15*  CALCIUM 8.1* 8.0* 8.9  MG  --  1.6* 2.2   GFR Estimated Creatinine Clearance: 34.7 mL/min (A) (by C-G formula based on SCr of 1.15 mg/dL (H)). Liver Function Tests: Recent Labs  Lab 09/11/20 1754  AST 23  ALT 9  ALKPHOS 57  BILITOT 0.8  PROT 7.6  ALBUMIN 4.0   No results for input(s): LIPASE, AMYLASE in the last 168 hours. No results for input(s): AMMONIA in the last 168 hours. Coagulation profile No results for input(s): INR, PROTIME in the last 168 hours.  CBC: Recent Labs  Lab 09/11/20 1754 09/12/20 0502 09/13/20 0409  WBC 8.7 7.0 6.9  NEUTROABS 6.2  --   --   HGB 8.8* 8.1* 8.4*  HCT 27.6* 26.1* 27.1*  MCV 83.4 85.3 85.8  PLT 232 208 215   Cardiac Enzymes: No results for input(s): CKTOTAL, CKMB, CKMBINDEX, TROPONINI in the last 168 hours. BNP: Invalid input(s): POCBNP CBG: Recent Labs  Lab 09/12/20 1145 09/12/20 1724 09/12/20 2052 09/13/20 0719 09/13/20 1148  GLUCAP 112* 83 118* 80 67*   D-Dimer No results for input(s): DDIMER in the last 72 hours. Hgb A1c Recent Labs    09/12/20 0816  HGBA1C 5.9*   Lipid Profile No results for input(s): CHOL, HDL, LDLCALC, TRIG, CHOLHDL, LDLDIRECT in the last 72 hours. Thyroid function studies No results for input(s): TSH, T4TOTAL, T3FREE, THYROIDAB in the last 72 hours.  Invalid input(s): FREET3 Anemia work up No results for input(s): VITAMINB12, FOLATE, FERRITIN, TIBC, IRON, RETICCTPCT in the last 72 hours. Microbiology Recent Results (from the past 240 hour(s))  Resp Panel by RT-PCR (Flu A&B, Covid) Nasopharyngeal Swab     Status: None   Collection Time: 09/11/20  7:44 PM   Specimen: Nasopharyngeal Swab; Nasopharyngeal(NP) swabs in vial transport medium  Result Value Ref Range Status    SARS Coronavirus 2 by RT PCR NEGATIVE NEGATIVE Final    Comment: (NOTE) SARS-CoV-2 target nucleic acids are NOT DETECTED.  The SARS-CoV-2 RNA is generally detectable in upper respiratory specimens during the acute phase of infection. The lowest concentration of SARS-CoV-2 viral copies this assay can detect is 138 copies/mL. A negative result does not preclude SARS-Cov-2 infection and should not be used as the sole basis for treatment or other patient management decisions. A negative result may occur with  improper specimen collection/handling, submission of specimen other than nasopharyngeal swab, presence of viral mutation(s) within the areas targeted by this assay, and inadequate number of viral copies(<138 copies/mL). A negative result must be combined with clinical observations, patient history, and epidemiological information. The expected result is Negative.  Fact Sheet for Patients:  EntrepreneurPulse.com.au  Fact Sheet for Healthcare Providers:  IncredibleEmployment.be  This test is no t yet approved or cleared by the [place] FDA and  has been authorized for detection and/or diagnosis of SARS-CoV-2 by FDA under an Emergency Use Authorization (EUA). This EUA will remain  in effect (meaning this test can be used) for the duration of the COVID-19 declaration under Section 564(b)(1) of the Act, 21 U.S.C.section 360bbb-3(b)(1), unless the authorization is terminated  or revoked sooner.       Influenza A by PCR NEGATIVE NEGATIVE Final   Influenza B by PCR NEGATIVE NEGATIVE Final    Comment: (NOTE) The Xpert Xpress SARS-CoV-2/FLU/RSV plus assay is intended as an aid in the diagnosis of influenza from Nasopharyngeal swab specimens and should not be used as a sole basis for treatment. Nasal washings and aspirates are unacceptable for Xpert Xpress SARS-CoV-2/FLU/RSV testing.  Fact Sheet for  Patients: EntrepreneurPulse.com.au  Fact Sheet for Healthcare Providers: IncredibleEmployment.be  This test is not yet approved or cleared by the [place] FDA and has been authorized for detection and/or diagnosis of SARS-CoV-2 by FDA under an Emergency Use Authorization (EUA). This EUA will remain in effect (meaning this test can be used) for the duration of the COVID-19 declaration under Section 564(b)(1) of the Act, 21 U.S.C. section 360bbb-3(b)(1), unless the authorization is terminated or revoked.  Performed at [facility], [address] [place], [place] [address]      Discharge Instructions:   Discharge Instructions     (HEART FAILURE PATIENTS) Call MD:  Anytime you have any of the following symptoms: 1) 3 pound weight gain in 24 hours or 5 pounds in 1 week 2) shortness of breath, with or without a dry hacking cough 3) swelling in the hands, feet or stomach 4) if you have to sleep on extra pillows at night in order to breathe.   Complete by: As directed    Diet - low sodium heart healthy   Complete by: As directed    Heart Failure patients record your daily weight using the same scale at the same time of day   Complete by: As directed    Increase activity slowly   Complete by: As directed       Allergies as of 09/13/2020       Reactions   Ferumoxytol Itching   Relieved with IV benadryl   Chocolate Other (See Comments)   Cough, sweating   Lisinopril Other (See Comments)   Cough, sweating   Peanuts [peanut Oil] Cough        Medication List     STOP taking these medications    amLODipine 10 MG tablet Commonly known as: NORVASC       TAKE these medications    albuterol 108 (90 Base) MCG/ACT inhaler Commonly known as: VENTOLIN HFA Inhale 2 puffs into the lungs every 4 (four) hours as needed for wheezing or shortness of breath.   atorvastatin 40 MG tablet Commonly known as: LIPITOR TAKE 1 TABLET BY  MOUTH EVERY DAY   Flovent HFA 110 MCG/ACT inhaler Generic drug: fluticasone TAKE 2 PUFFS BY MOUTH TWICE A DAY What changed: See the new instructions.   furosemide 20 MG tablet Commonly known as: LASIX Take 1 tablet (20 mg total) by mouth daily. What changed:  when to take this reasons to take this   gabapentin 100 MG capsule Commonly known as: NEURONTIN Take 100-200 mg by mouth at bedtime.   hydrocortisone 2.5 % rectal cream Commonly  known as: ANUSOL-HC Place 1 application rectally 2 (two) times daily. What changed:  when to take this reasons to take this   liver oil-zinc oxide 40 % ointment Commonly known as: DESITIN Apply topically as needed for irritation.   metoprolol tartrate 25 MG tablet Commonly known as: LOPRESSOR Take 1 tablet (25 mg total) by mouth 2 (two) times daily.   omeprazole 40 MG capsule Commonly known as: PRILOSEC Take 40 mg by mouth daily. What changed: Another medication with the same name was removed. Continue taking this medication, and follow the directions you see here.   Rivaroxaban 15 MG Tabs tablet Commonly known as: XARELTO Take 1 tablet (15 mg total) by mouth daily with supper.   Vitamin D 50 MCG (2000 UT) tablet Take 2,000 Units by mouth daily.          Time coordinating discharge: 35 min  Signed:  Geradine Girt DO  [facility] 09/13/2020, 12:15 PM

## 2020-09-13 NOTE — TOC Transition Note (Signed)
Transition of Care [facility]) - CM/SW Discharge Note   Patient Details  Name: [name] MRN: [id number] Date of Birth: 30-May-1933  [facility]) CM/SW Contact:  Lennart Pall, LCSW Phone Number: 09/13/2020, 1:06 PM   Clinical Narrative:    Pt medically cleared for dc today. Not requiring home oxygen.  Recommendation for HHPT and pt/daughter agreeable.  No agency preference and referral placed with [facility].     Final next level of care: Home w Home Health Services Barriers to Discharge: Barriers Resolved   Patient Goals and CMS Choice Patient states their goals for this hospitalization and ongoing recovery are:: return home      Discharge Placement                       Discharge Plan and Services                DME Arranged: N/A DME Agency: NA       HH Arranged: PT HH Agency: [place] Date [place]: 09/13/20 Time [place]: [id number] Representative spoke with at [place]: Angie  Social Determinants of Health ([place]) Interventions     Readmission Risk Interventions No flowsheet data found.

## 2020-09-23 ENCOUNTER — Encounter (HOSPITAL_BASED_OUTPATIENT_CLINIC_OR_DEPARTMENT_OTHER): Payer: Self-pay | Admitting: Family

## 2020-09-23 ENCOUNTER — Telehealth: Payer: Self-pay | Admitting: Family

## 2020-09-23 ENCOUNTER — Other Ambulatory Visit: Payer: Self-pay

## 2020-09-23 ENCOUNTER — Ambulatory Visit (INDEPENDENT_AMBULATORY_CARE_PROVIDER_SITE_OTHER): Payer: Medicare Other | Admitting: Family

## 2020-09-23 VITALS — BP 124/70 | HR 57 | Ht 64.0 in | Wt 136.6 lb

## 2020-09-23 DIAGNOSIS — I10 Essential (primary) hypertension: Secondary | ICD-10-CM | POA: Diagnosis not present

## 2020-09-23 DIAGNOSIS — I272 Pulmonary hypertension, unspecified: Secondary | ICD-10-CM

## 2020-09-23 DIAGNOSIS — I5189 Other ill-defined heart diseases: Secondary | ICD-10-CM

## 2020-09-23 DIAGNOSIS — I2510 Atherosclerotic heart disease of native coronary artery without angina pectoris: Secondary | ICD-10-CM

## 2020-09-23 DIAGNOSIS — K219 Gastro-esophageal reflux disease without esophagitis: Secondary | ICD-10-CM

## 2020-09-23 DIAGNOSIS — I5032 Chronic diastolic (congestive) heart failure: Secondary | ICD-10-CM

## 2020-09-23 MED ORDER — METOPROLOL TARTRATE 25 MG PO TABS: 25.0000 mg | ORAL_TABLET | Freq: Two times a day (BID) | ORAL | 1 refills | Status: DC

## 2020-09-23 MED ORDER — FUROSEMIDE 20 MG PO TABS: 20.0000 mg | ORAL_TABLET | Freq: Every day | ORAL | 1 refills | Status: DC

## 2020-09-23 MED ORDER — OMEPRAZOLE 40 MG PO CPDR: 40.0000 mg | DELAYED_RELEASE_CAPSULE | Freq: Every day | ORAL | 3 refills | Status: DC

## 2020-09-23 NOTE — Patient Instructions (Signed)
Medication Instructions:  Continue your current medications.   If you gain 2 pounds overnight or 5 pounds in one week you may take an additional Lasix '20mg'$  in the afternoon.   *If you need a refill on your cardiac medications before your next appointment, please call your pharmacy*   Lab Work: None ordered today.   Testing/Procedures: None ordered today.   Follow-Up: At [facility], you and your health needs are our priority.  As part of our continuing mission to provide you with exceptional heart care, we have created designated Provider Care Teams.  These Care Teams include your primary Cardiologist (physician) and Advanced Practice Providers (APPs -  Physician Assistants and Nurse Practitioners) who all work together to provide you with the care you need, when you need it.  We recommend signing up for the patient portal called "MyChart".  Sign up information is provided on this After Visit Summary.  MyChart is used to connect with patients for Virtual Visits (Telemedicine).  Patients are able to view lab/test results, encounter notes, upcoming appointments, etc.  Non-urgent messages can be sent to your provider as well.   To learn more about what you can do with MyChart, go to [email or web address].    Your next appointment:   3 month(s)  The format for your next appointment:   In Person  Provider:   You may see Sherren Mocha, MD or one of the following Advanced Practice Providers on your designated Care Team:   Richardson Dopp, PA-C [place], [place] Loel Dubonnet, NP    Other Instructions  Recommend weighing daily and keeping a log. Please call our office if you have weight gain of 2 pounds overnight or 5 pounds in 1 week.   Date  Time Weight                                           Heart Healthy Diet Recommendations: A low-salt diet is recommended. Meats should be grilled, baked, or boiled. Avoid fried foods. Focus on lean protein sources like fish  or chicken with vegetables and fruits. The American Heart Association is a Microbiologist!    Exercise recommendations: The American Heart Association recommends 150 minutes of moderate intensity exercise weekly. Try 30 minutes of moderate intensity exercise 4-5 times per week. This could include walking, jogging, or swimming.

## 2020-09-23 NOTE — Telephone Encounter (Signed)
    Patient Name: [name]  DOB: 11-11-1933 MRN: [id number]  Primary Cardiologist: Sherren Mocha, MD  Chart reviewed as part of pre-operative protocol coverage. Given past medical history and time since last visit, based on ACC/AHA guidelines, [name] would be at acceptable risk for the planned procedure without further cardiovascular testing.   She was hospitalized [id number] for diastolic heart failure and appropriately diuresed. At follow up in clinic today 09/23/20 she is doing well from a cardiac perspective.   The patient was advised that if she develops new symptoms prior to surgery to contact our office to arrange for a follow-up visit, and she verbalized understanding.  I will route this recommendation to the requesting party via Epic fax function and remove from pre-op pool.  Please call with questions.  Loel Dubonnet, NP 09/23/2020, 3:02 PM

## 2020-09-23 NOTE — Telephone Encounter (Signed)
Addressed at clinic visit today 09/23/20. I have faxed clearance to [facility], see previous clearance request and office note. Also provided patient with printed copy of clearance.   Loel Dubonnet, NP

## 2020-09-23 NOTE — Progress Notes (Signed)
Office Visit    Patient Name: [name] Date of Encounter: 09/23/2020  PCP:  [place], [place], [place]  Cardiologist:  Sherren Mocha, MD  Advanced Practice Provider:  No care team member to display Electrophysiologist:  None    Chief Complaint    [name] is a 87 y.o. female with a hx of hypertension, longstanding persistent atrial fibrillation, nonobstructive coronary artery disease, CKD 3 presents today for hospital follow-up  Past Medical History    Past Medical History:  Diagnosis Date   Arthritis    "all over"   Atrial fibrillation [facility])    Diagnosed ~2009   Breast cancer, left breast ([place]) 02/07/1996   S/P chemo and mastectomy    CHF (congestive heart failure) ([place])    GERD (gastroesophageal reflux disease)    Hyperlipidemia    Hypertension    Left shoulder pain    "MRI showed pinched nerve" - per pt   MI (myocardial infarction) ([place])    OSA on CPAP    Pneumonia    "4-5 times" (01/06/2015)   Type II diabetes mellitus ([place])    Past Surgical History:  Procedure Laterality Date   BREAST BIOPSY Left 1998   CATARACT EXTRACTION W/ INTRAOCULAR LENS  IMPLANT, BILATERAL Bilateral    JOINT REPLACEMENT     LAPAROSCOPIC CHOLECYSTECTOMY     LEFT HEART CATH AND CORONARY ANGIOGRAPHY N/A 07/07/2020   Procedure: LEFT HEART CATH AND CORONARY ANGIOGRAPHY;  Surgeon: Jettie Booze, MD;  Location: [place] [facility];  Service: Cardiovascular;  Laterality: N/A;   MASTECTOMY Left 1998   TOTAL KNEE ARTHROPLASTY Left 2001   TOTAL KNEE REVISION Left 01/28/2013   Procedure: LEFT TOTAL KNEE ARTHROPLASTY REVISION;  Surgeon: Marianna Payment, MD;  Location: [place];  Service: Orthopedics;  Laterality: Left;    Allergies  Allergies  Allergen Reactions   Ferumoxytol Itching    Relieved with IV benadryl   Chocolate Other (See Comments)    Cough, sweating    Lisinopril Other (See Comments)    Cough, sweating    Peanuts [Peanut Oil] Cough    History of Present Illness    [name] is a 87 y.o. female with a hx of hypertension, longstanding persistent atrial fibrillation, nonobstructive coronary artery disease, CKD 3  last seen 08/12/2020  She was admitted 07/03/2020 with chest pain.  Her EKG showed subtle changes of possible inferior injury but did not meet STEMI criteria.  She underwent CT of the chest with no evidence of acute aortic syndrome nor dissection.  She was transferred to [facility] due to concern for need for cardiac catheterization and received fentanyl patch which resolved her pain.  At bedtime troponin peaked at 3872.  She was treated with IV heparin and underwent cardiac cath showing mild nonobstructive coronary artery disease.  She was recommended for medical therapy.  Echo 07/04/2020 with LVEF 55-60%, indeterminate diastolic parameters, RV SF mildly reduced, mildly elevated PASP, severely enlarged right ventricle, LA moderately dilated, RA severely dilated, mild MR, mild dilation of ascending aorta 40 mm.  She was discharged on p.o. iron due to anemia.  She does not tolerate IV iron due to itching/allergic reaction.  She was discharged on atorvastatin 40 mg daily.  Sinus pauses less than 3 seconds which were asymptomatic were noted on the monitor and her metoprolol was discontinued but due to subsequent elevated rates from metoprolol tartrate 25 mg twice daily was resumed.  ED visit 07/31/2020 for atypical chest pain.  Chest x-ray as read by radiologist showed atelectasis or infiltrate in right and mid lower lungs.  ED provider felt the changes were related to heart failure.  She was discharged with prescription for furosemide as well as increased dose of omeprazole.  Also noted slightly worse renal function than her baseline.  Seen in follow up 08/12/20 for follow up. Felt improved with 5 days of Lasix but started noted dyspnea worsening since completing course. Lasix '20mg'$  daily was  initiated.  ED visit 08/14/20 due to shortness of breath and 08/29/20 with hemorrhoids.   She was admitted 09/11/2020 - 09/13/2020 due to acute on chronic diastolic heart failure.  She was placed on Lasix 40 mg IV every 12 hours with urine output of 850 cc over 24 hours.  Her home Lasix was resumed on discharge.  Amlodipine was discontinued to allow for room and blood pressure for diuresis.  She presents today for follow-up with her daughter. Her weight has been decreasing since being home. Weight 08/12/20 152 lbs and weight today 136 lbs. Drinks tea and water. Drinking 2 glasses  and 3 bottles of water.  Low-salt diet and less than 2 L fluid intake per day.  Reports her dyspnea on exertion is improving.  Denies chest pressure, tightness.  No shortness of breath at rest.  Orthopnea, PND.  Tells me she is overall feeling much better.  She has upcoming eye surgery.  EKGs/Labs/Other Studies Reviewed:   The following studies were reviewed today: LHC 07-28-2020   Conclusion    Mid RCA lesion is 25% stenosed. LV end diastolic pressure is normal. There is no aortic valve stenosis.   Nonobstructive CAD.  Continue medical therapy.    Recommendations  Antiplatelet/Anticoag Recommend to resume Rivaroxaban, at currently prescribed dose and frequency on 07/08/2020. Concurrent antiplatelet therapy not recommended.   Coronary Diagrams   Diagnostic Dominance: Right        Echo 07/04/2020 1. Left ventricular ejection fraction, by estimation, is 55 to 60%. The  left ventricle has normal function. The left ventricle has no regional  wall motion abnormalities. There is mild left ventricular hypertrophy.  Left ventricular diastolic parameters  are indeterminate.   2. Right ventricular systolic function is mildly reduced. The right  ventricular size is severely enlarged. There is mildly elevated pulmonary  artery systolic pressure.   3. Left atrial size was moderately dilated.   4. Right atrial size was  severely dilated.   5. The mitral valve is normal in structure. Mild mitral valve  regurgitation. No evidence of mitral stenosis.   6. Tricuspid valve regurgitation is severe.   7. The aortic valve is tricuspid. Aortic valve regurgitation is trivial.  Mild aortic valve sclerosis is present, with no evidence of aortic valve  stenosis.   8. Aortic dilatation noted. There is mild dilatation of the ascending  aorta, measuring 40 mm.   9. The inferior vena cava is dilated in size with <50% respirat ory  variability, suggesting right atrial pressure of 15 mmHg.   EKG: No EKG EKG is  ordered today.  The ekg independently reviewed from 08/12/2020 demonstrated atrial fibrillation 117 - 128 bpm. PVc's. No acute ST/T wave changes.   Recent Labs: 09/11/2020: ALT 9; B Natriuretic Peptide 487.3 09/13/2020: BUN 16; Creatinine, Ser 1.15; Hemoglobin 8.4; Magnesium 2.2; Platelets 215; Potassium 4.4; Sodium 136  Recent Lipid Panel    Component Value Date/Time   CHOL 148 07/04/2020 0341   TRIG 52  07/04/2020 0341   HDL 53 07/04/2020 0341   CHOLHDL 2.8 07/04/2020 0341   VLDL 10 07/04/2020 0341   LDLCALC 85 07/04/2020 0341    Home Medications   Current Meds  Medication Sig   albuterol (VENTOLIN HFA) 108 (90 Base) MCG/ACT inhaler Inhale 2 puffs into the lungs every 4 (four) hours as needed for wheezing or shortness of breath.   atorvastatin (LIPITOR) 40 MG tablet TAKE 1 TABLET BY MOUTH EVERY DAY   Cholecalciferol (VITAMIN D) 2000 units tablet Take 2,000 Units by mouth daily.   FLOVENT HFA 110 MCG/ACT inhaler TAKE 2 PUFFS BY MOUTH TWICE A DAY   gabapentin (NEURONTIN) 100 MG capsule Take 100-200 mg by mouth at bedtime.   hydrocortisone (ANUSOL-HC) 2.5 % rectal cream Place 1 application rectally 2 (two) times daily.   liver oil-zinc oxide (DESITIN) 40 % ointment Apply topically as needed for irritation.   Rivaroxaban (XARELTO) 15 MG TABS tablet Take 1 tablet (15 mg total) by mouth daily with supper.    [DISCONTINUED] furosemide (LASIX) 20 MG tablet Take 1 tablet (20 mg total) by mouth daily.   [DISCONTINUED] metoprolol tartrate (LOPRESSOR) 25 MG tablet Take 1 tablet (25 mg total) by mouth 2 (two) times daily.   [DISCONTINUED] omeprazole (PRILOSEC) 40 MG capsule Take 40 mg by mouth daily.    Review of Systems      All other systems reviewed and are otherwise negative except as noted above.  Physical Exam    VS:  BP 124/70   Pulse (!) 57   Ht '5\' 4"'$  (1.626 m)   Wt 136 lb 9.6 oz (62 kg)   SpO2 95%   BMI 23.45 kg/m  , BMI Body mass index is 23.45 kg/m.  Wt Readings from Last 3 Encounters:  09/23/20 136 lb 9.6 oz (62 kg)  09/13/20 151 lb 8 oz (68.7 kg)  08/29/20 154 lb 8.7 oz (70.1 kg)    GEN: Well nourished, well developed, in no acute distress. HEENT: normal. Neck: Supple, no JVD, carotid bruits, or masses. Cardiac: Irregularly irregular, no murmurs, rubs, or gallops. No clubbing, cyanosis.  Nonpitting lower extremity edema..  Radials/PT 2+ and equal bilaterally.  Respiratory:  Respirations regular and unlabored, clear to auscultation bilaterally. GI: Soft, nontender, nondistended. MS: No deformity or atrophy. Skin: Warm and dry, no rash. Neuro:  Strength and sensation are intact. Psych: Normal affect.  Assessment & Plan    CAD-  Stable with no anginal symptoms. No indication for ischemic evaluation. GDMT includes metoprolol, atorvastatin. No aspirin due to chronic anticoagulation. Heart healthy diet and regular cardiovascular exercise encouraged.    Preop clearance -She is deemed acceptable risk for planned eye procedure without additional cardiovascular testing.  Procedure does not require holding her anticoagulation.  I have routed clearance notes to [facility] so they are aware.  Persistent atrial fibrillation/chronic anticoagulation/diastolic dysfunction/ mild pulmonary hypertension-rate controlled today.  Continue present dose of metoprolol.  Continue Xarelto 15 mg  daily due to CHA2DS2-VASc of at least 5 ( agex2, gender, HTN, CAD).  Denies bleeding complications.  Continue Lasix 20 mg daily with additional 20 mg as needed for weight gain of 2 pounds overnight or 5 pounds in 1 week.  She is euvolemic and well compensated on exam today  Hyperlipidemia- Continue Atorvastatin '80mg'$  QD. Consider repeat lipid panel at follow up.   Hypertension- BP well controlled. Continue current antihypertensive regimen.    Mild aortic dilation -40 mm by echo 07/04/2020.  Continue optimal BP and volume  control.  Consider repeat echo June 2023.  Sinus pause -Noted during admission 06/2020 mostly less than 3 seconds in all asymptomatic.  Her Toprol was stopped but then developed rapid rate.  She was returned to metoprolol 25 mg twice daily. NO recurrent pause noted during recent admission.  Disposition: Follow up in 3 month(s) with Dr. Burt Knack or APP.  Signed, Loel Dubonnet, NP 09/23/2020, 5:10 PM [place]

## 2020-09-23 NOTE — Telephone Encounter (Signed)
[place] wanting to ensure pt is still cleared for surgery following hospital visit 09/11/2020. Clearance completed 09/07/2020, pt to see Laurann Montana today at 2:30pm. Nurse to send question to preop and Laurann Montana for review.

## 2020-09-23 NOTE — Telephone Encounter (Signed)
Just wanted to inform you that this pt is coming in today for a  follow up appt after being admitted into the hospital on 08/06 pt had previously been given surgical clearance however [place] is wanting to make sure that with her being admitted to the hospital that she is still cleared...please advise

## 2020-12-21 NOTE — Progress Notes (Signed)
Office Visit    Patient Name: [name] Date of Encounter: 12/22/2020  PCP:  [place], [place], [place]  Cardiologist:  Sherren Mocha, MD  Advanced Practice Provider:  No care team member to display Electrophysiologist:  None     Chief Complaint    [name] is a 87 y.o. female with a hx of hypertension, longstanding persistent atrial fibrillation, nonobstructive coronary artery disease, and CKD stage III presents today for a 18-month follow-up appointment.  Past Medical History    Past Medical History:  Diagnosis Date   Arthritis    "all over"   Atrial fibrillation [facility])    Diagnosed ~2009   Breast cancer, left breast ([place]) 02/07/1996   S/P chemo and mastectomy    CHF (congestive heart failure) ([facility])    GERD (gastroesophageal reflux disease)    Hyperlipidemia    Hypertension    Left shoulder pain    "MRI showed pinched nerve" - per pt   MI (myocardial infarction) ([place])    OSA on CPAP    Pneumonia    "4-5 times" (01/06/2015)   Type II diabetes mellitus ([place])    Past Surgical History:  Procedure Laterality Date   BREAST BIOPSY Left 1998   CATARACT EXTRACTION W/ INTRAOCULAR LENS  IMPLANT, BILATERAL Bilateral    JOINT REPLACEMENT     LAPAROSCOPIC CHOLECYSTECTOMY     LEFT HEART CATH AND CORONARY ANGIOGRAPHY N/A 07/07/2020   Procedure: LEFT HEART CATH AND CORONARY ANGIOGRAPHY;  Surgeon: Jettie Booze, MD;  Location: [place] [facility];  Service: Cardiovascular;  Laterality: N/A;   MASTECTOMY Left 1998   TOTAL KNEE ARTHROPLASTY Left 2001   TOTAL KNEE REVISION Left 01/28/2013   Procedure: LEFT TOTAL KNEE ARTHROPLASTY REVISION;  Surgeon: Marianna Payment, MD;  Location: [place];  Service: Orthopedics;  Laterality: Left;    Allergies  Allergies  Allergen Reactions   Ferumoxytol Itching    Relieved with IV benadryl   Chocolate Other (See Comments)    Cough, sweating    Lisinopril Other (See Comments)     Cough, sweating   Peanuts [Peanut Oil] Cough    History of Present Illness    [name] is a 87 y.o. female with a hx of hx of hypertension, longstanding persistent atrial fibrillation, nonobstructive coronary artery disease, and CKD stage III presents today for a 51-month follow-up appointment.  last seen by Christell Faith, NP on 09/23/2020.  She was admitted 07/03/2020 with chest pain.  Her EKG showed subtle changes of possible inferior injury but did not meet STEMI criteria.  She underwent CT of the chest with no evidence of acute aortic syndrome nor dissection.  She was transferred to [facility] due to concern for need for cardiac catheterization and received fentanyl patch which resolved her pain.  At bedtime troponin peaked at 3872.  She was treated with IV heparin and underwent cardiac cath showing mild nonobstructive coronary artery disease.  She was recommended for medical therapy.  Echo 07/04/2020 with LVEF 55-60%, indeterminate diastolic parameters, RV SF mildly reduced, mildly elevated PASP, severely enlarged right ventricle, LA moderately dilated, RA severely dilated, mild MR, mild dilation of ascending aorta 40 mm.  She was discharged on p.o. iron due to anemia.  She does not tolerate IV iron due to itching/allergic reaction.  She was discharged on atorvastatin 40 mg daily.  Sinus pauses less than 3 seconds which were asymptomatic were noted on the monitor  and her metoprolol was discontinued but due to subsequent elevated rates from metoprolol tartrate 25 mg twice daily was resumed.   ED visit 07/31/2020 for atypical chest pain.  Chest x-ray as read by radiologist showed atelectasis or infiltrate in right and mid lower lungs.  ED provider felt the changes were related to heart failure.  She was discharged with prescription for furosemide as well as increased dose of omeprazole.  Also noted slightly worse renal function than her baseline.   Seen in follow up 08/12/20 for follow up. Felt  improved with 5 days of Lasix but started noted dyspnea worsening since completing course. Lasix 20mg  daily was initiated.   ED visit 08/14/20 due to shortness of breath and 08/29/20 with hemorrhoids.    She was admitted 09/11/2020 - 09/13/2020 due to acute on chronic diastolic heart failure.  She was placed on Lasix 40 mg IV every 12 hours with urine output of 850 cc over 24 hours.  Her home Lasix was resumed on discharge.  Amlodipine was discontinued to allow for room and blood pressure for diuresis.  09/23/2020 she was seen in the clinic by Laurann Montana, NP.  Her weight has been decreasing since being at home from her hospitalization.  She was drinking plenty of water at that time.  She was maintaining a low-sodium diet and a fluid restriction, less than 2 L a day.  She reports that her dyspnea on exertion was improving. She was overall feeling better.  Today, she feeling pretty good overall. No issue with edema. Weight has been stable. BP 119/84 this morning when she took it. It has been well controlled at home. She continues to do some walking around the house but has stopped walking outside since it is too cold now. She did some physical therapy at home after her hospitalization and has continued to do those exercises at home. She stays in rate-controlled atrial fibrillation but is asymptomatic at this time. She has not had any bleeding on Xarelto. Her last LDL was elevated at 85, goal is < 70. We will plan to redraw a direct LDL and CMP to re-evaluate. She is only on Atorvastatin 40mg , so we have room to increase if needed. She had a right heart catheterization back in June 2022 which showed 25% stenosis of her RCA and was otherwise unremarkable.   Reports no shortness of breath nor dyspnea on exertion. Reports no chest pain, pressure, or tightness. No edema, orthopnea, PND. Reports no palpitations.     EKGs/Labs/Other Studies Reviewed:   The following studies were reviewed today: LHC 07-30-20      Conclusion     Mid RCA lesion is 25% stenosed. LV end diastolic pressure is normal. There is no aortic valve stenosis.   Nonobstructive CAD.  Continue medical therapy.     Recommendations   Antiplatelet/Anticoag Recommend to resume Rivaroxaban, at currently prescribed dose and frequency on 07/08/2020. Concurrent antiplatelet therapy not recommended.    Coronary Diagrams     Diagnostic Dominance: Right            Echo 07/04/2020 1. Left ventricular ejection fraction, by estimation, is 55 to 60%. The  left ventricle has normal function. The left ventricle has no regional  wall motion abnormalities. There is mild left ventricular hypertrophy.  Left ventricular diastolic parameters  are indeterminate.   2. Right ventricular systolic function is mildly reduced. The right  ventricular size is severely enlarged. There is mildly elevated pulmonary  artery systolic pressure.   3.  Left atrial size was moderately dilated.   4. Right atrial size was severely dilated.   5. The mitral valve is normal in structure. Mild mitral valve  regurgitation. No evidence of mitral stenosis.   6. Tricuspid valve regurgitation is severe.   7. The aortic valve is tricuspid. Aortic valve regurgitation is trivial.  Mild aortic valve sclerosis is present, with no evidence of aortic valve  stenosis.   8. Aortic dilatation noted. There is mild dilatation of the ascending aorta, measuring 40 mm.   9. The inferior vena cava is dilated in size with <50% respirat ory  variability, suggesting right atrial pressure of 15 mmHg.    EKG: No EKG EKG is  ordered today.  The ekg independently reviewed from 08/12/2020 demonstrated atrial fibrillation 117 - 128 bpm. PVc's. No acute ST/T wave changes.    Recent Labs: 09/11/2020: ALT 9; B Natriuretic Peptide 487.3 09/13/2020: BUN 16; Creatinine, Ser 1.15; Hemoglobin 8.4; Magnesium 2.2; Platelets 215; Potassium 4.4; Sodium 136   EKG:  No EKG ordered today.   Recent  Labs: 09/11/2020: ALT 9; B Natriuretic Peptide 487.3 09/13/2020: BUN 16; Creatinine, Ser 1.15; Hemoglobin 8.4; Magnesium 2.2; Platelets 215; Potassium 4.4; Sodium 136  Recent Lipid Panel    Component Value Date/Time   CHOL 148 07/04/2020 0341   TRIG 52 07/04/2020 0341   HDL 53 07/04/2020 0341   CHOLHDL 2.8 07/04/2020 0341   VLDL 10 07/04/2020 0341   LDLCALC 85 07/04/2020 0341    Risk Assessment/Calculations:   CHA2DS2-VASc Score = 5  This indicates a 7.2% annual risk of stroke. The patient's score is based upon: CHF History: 1 HTN History: 1 Diabetes History: 0 Stroke History: 0 Vascular Disease History: 0 Age Score: 2 Gender Score: 1    Home Medications   Current Meds  Medication Sig   albuterol (VENTOLIN HFA) 108 (90 Base) MCG/ACT inhaler Inhale 2 puffs into the lungs every 4 (four) hours as needed for wheezing or shortness of breath.   amLODipine (NORVASC) 10 MG tablet Take 10 mg by mouth daily.   atorvastatin (LIPITOR) 40 MG tablet TAKE 1 TABLET BY MOUTH EVERY DAY   Cholecalciferol (VITAMIN D) 2000 units tablet Take 2,000 Units by mouth daily.   FLOVENT HFA 110 MCG/ACT inhaler TAKE 2 PUFFS BY MOUTH TWICE A DAY   furosemide (LASIX) 20 MG tablet Take 1 tablet (20 mg total) by mouth daily.   gabapentin (NEURONTIN) 100 MG capsule Take 100-200 mg by mouth at bedtime.   hydrocortisone (ANUSOL-HC) 2.5 % rectal cream Place 1 application rectally 2 (two) times daily.   liver oil-zinc oxide (DESITIN) 40 % ointment Apply topically as needed for irritation.   metoprolol tartrate (LOPRESSOR) 25 MG tablet Take 1 tablet (25 mg total) by mouth 2 (two) times daily.   omeprazole (PRILOSEC) 40 MG capsule Take 1 capsule (40 mg total) by mouth daily.   Rivaroxaban (XARELTO) 15 MG TABS tablet Take 1 tablet (15 mg total) by mouth daily with supper.     Review of Systems    All other systems reviewed and are otherwise negative except as noted above.  Physical Exam    VS:  BP 124/74 (BP  Location: Left Arm, Patient Position: Sitting, Cuff Size: Normal)   Pulse 80   Ht 5\' 4"  (1.626 m)   Wt 143 lb 3.2 oz (65 kg)   SpO2 96%   BMI 24.58 kg/m  , BMI Body mass index is 24.58 kg/m.  Wt Readings from Last 3  Encounters:  12/22/20 143 lb 3.2 oz (65 kg)  09/23/20 136 lb 9.6 oz (62 kg)  09/13/20 151 lb 8 oz (68.7 kg)     GEN: Well nourished, well developed, in no acute distress. HEENT: normal. Cardiac: RRR, no murmurs, rubs, or gallops. No clubbing, cyanosis, edema.  Radials/PT 2+ and equal bilaterally.  Respiratory:  Respirations regular and unlabored, clear to auscultation bilaterally. GI: Soft, nontender, nondistended. MS: No deformity or atrophy. Skin: Warm and dry, no rash. Neuro:  Strength and sensation are intact. Psych: Normal affect.  Assessment & Plan    CAD-this has been stable without any chest pain.  No indication at this time for an ischemic evaluation. GDMT: Toprol, atorvastatin.  No aspirin due to chronic anticoagulation.  Encouraged exercise 30 minutes a day 5 times a week and a heart healthy diet.  Persistent atrial fibrillation on chronic anticoagulation-rate controlled today.  Continue current dose of metoprolol.  She is on Xarelto 15 mg daily due to a CHA2DS2-VASc score of 5.  She denies bleeding. No symptoms.   Systolic dysfunction with mild pulmonary hypertension-continue Lasix 20 mg daily with an additional 20 mg as needed for weight gain of 2 pounds overnight or 5 pounds in 1 week.  She is euvolemic on exam today.  Hyperlipidemia-continue Atorvastatin 40 mg daily.  Most recent LDL was 85 on 07/04/2020.  We will order a repeat direct LDL and CMP today. If LDL not at goal, we will plan to increase her Atorvastatin to 80mg  daily.   Hypertension- BP well controlled today and at home. Continue current antihypertensive regimen.    Mild ascending aortic dilatation-40 mm by echocardiogram performed on 07/04/2020.  Continue tight blood pressure control.   Coordinate echo at next follow-up appointment.   Disposition: Follow up 6 months with Sherren Mocha, MD or APP.  Signed, Elgie Collard, PA-C 12/22/2020, 3:50 PM [facility]

## 2020-12-22 ENCOUNTER — Other Ambulatory Visit: Payer: Self-pay

## 2020-12-22 ENCOUNTER — Encounter (HOSPITAL_BASED_OUTPATIENT_CLINIC_OR_DEPARTMENT_OTHER): Payer: Self-pay | Admitting: Physician Assistant

## 2020-12-22 ENCOUNTER — Ambulatory Visit (INDEPENDENT_AMBULATORY_CARE_PROVIDER_SITE_OTHER): Payer: Medicare Other | Admitting: Physician Assistant

## 2020-12-22 VITALS — BP 124/74 | HR 80 | Ht 64.0 in | Wt 143.2 lb

## 2020-12-22 DIAGNOSIS — I4819 Other persistent atrial fibrillation: Secondary | ICD-10-CM

## 2020-12-22 DIAGNOSIS — I2510 Atherosclerotic heart disease of native coronary artery without angina pectoris: Secondary | ICD-10-CM

## 2020-12-22 DIAGNOSIS — I519 Heart disease, unspecified: Secondary | ICD-10-CM

## 2020-12-22 DIAGNOSIS — I272 Pulmonary hypertension, unspecified: Secondary | ICD-10-CM

## 2020-12-22 DIAGNOSIS — E782 Mixed hyperlipidemia: Secondary | ICD-10-CM

## 2020-12-22 DIAGNOSIS — I10 Essential (primary) hypertension: Secondary | ICD-10-CM

## 2020-12-22 DIAGNOSIS — I7121 Aneurysm of the ascending aorta, without rupture: Secondary | ICD-10-CM

## 2020-12-22 NOTE — Patient Instructions (Addendum)
Medication Instructions:  Your Physician recommend you continue on your current medication as directed.    *If you need a refill on your cardiac medications before your next appointment, please call your pharmacy*   Lab Work: Please report to the third floor for lab work TODAY- LDL (direct) and CMP  You can come to [place], [place] or any [employer] If you have labs (blood work) drawn today and your tests are completely normal, you will receive your results only by: [employer] (if you have [place]) OR A paper copy in the mail If you have any lab test that is abnormal or we need to change your treatment, we will call you to review the results.   Testing/Procedures: None ordered today   Follow-Up: At [facility], you and your health needs are our priority.  As part of our continuing mission to provide you with exceptional heart care, we have created designated Provider Care Teams.  These Care Teams include your primary Cardiologist (physician) and Advanced Practice Providers (APPs -  Physician Assistants and Nurse Practitioners) who all work together to provide you with the care you need, when you need it.  We recommend signing up for the patient portal called "MyChart".  Sign up information is provided on this After Visit Summary.  MyChart is used to connect with patients for Virtual Visits (Telemedicine).  Patients are able to view lab/test results, encounter notes, upcoming appointments, etc.  Non-urgent messages can be sent to your provider as well.   To learn more about what you can do with MyChart, go to [email or web address].    Your next appointment:   6 Months   The format for your next appointment:   In Person  Provider:   Sherren Mocha, MD or APPs   Other Instructions None today

## 2020-12-23 LAB — COMPREHENSIVE METABOLIC PANEL
ALT: 14 IU/L (ref 0–32)
AST: 30 IU/L (ref 0–40)
Albumin/Globulin Ratio: 1.5 (ref 1.2–2.2)
Albumin: 5.1 g/dL — ABNORMAL HIGH (ref 3.6–4.6)
Alkaline Phosphatase: 124 IU/L — ABNORMAL HIGH (ref 44–121)
BUN/Creatinine Ratio: 22 (ref 12–28)
BUN: 21 mg/dL (ref 8–27)
Bilirubin Total: 0.3 mg/dL (ref 0.0–1.2)
CO2: 21 mmol/L (ref 20–29)
Calcium: 10.3 mg/dL (ref 8.7–10.3)
Chloride: 100 mmol/L (ref 96–106)
Creatinine, Ser: 0.95 mg/dL (ref 0.57–1.00)
Globulin, Total: 3.3 g/dL (ref 1.5–4.5)
Glucose: 96 mg/dL (ref 70–99)
Potassium: 4.8 mmol/L (ref 3.5–5.2)
Sodium: 144 mmol/L (ref 134–144)
Total Protein: 8.4 g/dL (ref 6.0–8.5)
eGFR: 59 mL/min/{1.73_m2} — ABNORMAL LOW (ref >59)

## 2020-12-23 LAB — LDL CHOLESTEROL, DIRECT: LDL Direct: 46 mg/dL (ref 0–99)

## 2021-03-23 ENCOUNTER — Other Ambulatory Visit (HOSPITAL_BASED_OUTPATIENT_CLINIC_OR_DEPARTMENT_OTHER): Payer: Self-pay | Admitting: Family

## 2021-03-23 DIAGNOSIS — I2510 Atherosclerotic heart disease of native coronary artery without angina pectoris: Secondary | ICD-10-CM

## 2021-03-23 DIAGNOSIS — I10 Essential (primary) hypertension: Secondary | ICD-10-CM

## 2021-03-23 NOTE — Telephone Encounter (Signed)
This is a [facility] pt

## 2021-04-19 ENCOUNTER — Emergency Department (HOSPITAL_BASED_OUTPATIENT_CLINIC_OR_DEPARTMENT_OTHER): Payer: Medicare Other

## 2021-04-19 ENCOUNTER — Other Ambulatory Visit: Payer: Self-pay

## 2021-04-19 ENCOUNTER — Emergency Department (HOSPITAL_BASED_OUTPATIENT_CLINIC_OR_DEPARTMENT_OTHER)
Admission: EM | Admit: 2021-04-19 | Discharge: 2021-04-19 | Disposition: A | Discharge status: Home / Self Care | Payer: Medicare Other | Attending: Emergency Medicine | Admitting: Emergency Medicine

## 2021-04-19 ENCOUNTER — Encounter (HOSPITAL_BASED_OUTPATIENT_CLINIC_OR_DEPARTMENT_OTHER): Payer: Self-pay | Admitting: Urology

## 2021-04-19 DIAGNOSIS — R0789 Other chest pain: Secondary | ICD-10-CM | POA: Insufficient documentation

## 2021-04-19 DIAGNOSIS — Z79899 Other long term (current) drug therapy: Secondary | ICD-10-CM | POA: Diagnosis not present

## 2021-04-19 DIAGNOSIS — I10 Essential (primary) hypertension: Secondary | ICD-10-CM | POA: Diagnosis not present

## 2021-04-19 DIAGNOSIS — I2510 Atherosclerotic heart disease of native coronary artery without angina pectoris: Secondary | ICD-10-CM | POA: Diagnosis not present

## 2021-04-19 LAB — BASIC METABOLIC PANEL
Anion gap: 10 (ref 5–15)
BUN: 12 mg/dL (ref 8–23)
CO2: 26 mmol/L (ref 22–32)
Calcium: 9.4 mg/dL (ref 8.9–10.3)
Chloride: 100 mmol/L (ref 98–111)
Creatinine, Ser: 1.01 mg/dL — ABNORMAL HIGH (ref 0.44–1.00)
GFR, Estimated: 55 mL/min — ABNORMAL LOW (ref >60)
Glucose, Bld: 116 mg/dL — ABNORMAL HIGH (ref 70–99)
Potassium: 3.4 mmol/L — ABNORMAL LOW (ref 3.5–5.1)
Sodium: 136 mmol/L (ref 135–145)

## 2021-04-19 LAB — CBC
HCT: 34.9 % — ABNORMAL LOW (ref 36.0–46.0)
Hemoglobin: 11.1 g/dL — ABNORMAL LOW (ref 12.0–15.0)
MCH: 28.3 pg (ref 26.0–34.0)
MCHC: 31.8 g/dL (ref 30.0–36.0)
MCV: 89 fL (ref 80.0–100.0)
Platelets: 358 10*3/uL (ref 150–400)
RBC: 3.92 MIL/uL (ref 3.87–5.11)
RDW: 14.6 % (ref 11.5–15.5)
WBC: 15.2 10*3/uL — ABNORMAL HIGH (ref 4.0–10.5)
nRBC: 0 % (ref 0.0–0.2)

## 2021-04-19 LAB — TROPONIN I (HIGH SENSITIVITY)
Troponin I (High Sensitivity): 7 ng/L (ref <18)
Troponin I (High Sensitivity): 7 ng/L (ref <18)

## 2021-04-19 NOTE — ED Notes (Signed)
ED Provider at bedside. 

## 2021-04-19 NOTE — ED Notes (Signed)
Written and verbal inst to pt  Verbalized an understanding  To home with family  

## 2021-04-19 NOTE — ED Triage Notes (Signed)
Pt states left sided chest pain radiating to shoulder and neck that started 3 days ago  ?States "I feel like my food is not digesting"  ?Afib at baseline ? ?H/o MI  ?

## 2021-04-19 NOTE — Discharge Instructions (Addendum)
Work-up for any cardiac chest pain was negative.  Do follow-up with your doctors.  Return for any new or worse symptoms.  May want to try some Tums on top of taking your Prilosec this may be reflux. ?

## 2021-04-19 NOTE — ED Provider Notes (Signed)
?[facility] EMERGENCY DEPARTMENT ?Provider Note ? ? ?CSN: [id number] ?Arrival date & time: 04/19/21  1756 ? ?  ? ?History ? ?Chief Complaint  ?Patient presents with  ? Chest Pain  ? ? ?[name] is a 88 y.o. female. ? ?Patient with a complaint of left anterior chest pain, radiating to the shoulder left arm for the past 3 days.  But was almost nonstop yesterday.  No pain currently right now.  Not associated with shortness of breath nausea or vomiting.  No leg swelling. ? ?Patient has a history of hypertension atrial fibrillation gastroesophageal reflux disease is on Prilosec currently.  Hyperlipidemia history of chronic left shoulder pain type 2 diabetes, congestive heart failure coronary artery disease.  Patient states she has a stent.  Patient also had breast cancer in 1998.  Had a left heart cath in 2022.  Patient's last admission to the hospital was August 6.  That was for congestive heart failure. ? ? ?  ? ?Home Medications ?Prior to Admission medications   ?Medication Sig Start Date End Date Taking? Authorizing Provider  ?albuterol (VENTOLIN HFA) 108 (90 Base) MCG/ACT inhaler Inhale 2 puffs into the lungs every 4 (four) hours as needed for wheezing or shortness of breath.    [provider]  ?amLODipine (NORVASC) 10 MG tablet Take 10 mg by mouth daily. 10/26/20   [provider]  ?atorvastatin (LIPITOR) 40 MG tablet TAKE 1 TABLET BY MOUTH EVERY DAY 08/23/20   Sherren Mocha, MD  ?Cholecalciferol (VITAMIN D) 2000 units tablet Take 2,000 Units by mouth daily.    [provider]  ?FLOVENT HFA 110 MCG/ACT inhaler TAKE 2 PUFFS BY MOUTH TWICE A DAY 03/21/17   Bobbitt, Sedalia Muta, MD  ?furosemide (LASIX) 20 MG tablet Take 1 tablet (20 mg total) by mouth daily. 09/23/20   Loel Dubonnet, NP  ?gabapentin (NEURONTIN) 100 MG capsule Take 100-200 mg by mouth at bedtime. 06/22/20   [provider]  ?hydrocortisone (ANUSOL-HC) 2.5 % rectal cream Place 1 application  rectally 2 (two) times daily. 08/29/20   Sponseller, Gypsy Balsam, PA-C  ?liver oil-zinc oxide (DESITIN) 40 % ointment Apply topically as needed for irritation. 09/13/20   Geradine Girt, DO  ?metoprolol tartrate (LOPRESSOR) 25 MG tablet TAKE 1 TABLET BY MOUTH TWICE A DAY 03/23/21   Loel Dubonnet, NP  ?omeprazole (PRILOSEC) 40 MG capsule Take 1 capsule (40 mg total) by mouth daily. 09/23/20   Loel Dubonnet, NP  ?Rivaroxaban (XARELTO) 15 MG TABS tablet Take 1 tablet (15 mg total) by mouth daily with supper. 07/09/20   Cheryln Manly, NP  ?   ? ?Allergies    ?Ferumoxytol, Chocolate, Lisinopril, and Peanuts [peanut oil]   ? ?Review of Systems   ?Review of Systems  ?Constitutional:  Negative for chills and fever.  ?HENT:  Negative for ear pain and sore throat.   ?Eyes:  Negative for pain and visual disturbance.  ?Respiratory:  Negative for cough and shortness of breath.   ?Cardiovascular:  Negative for chest pain and palpitations.  ?Gastrointestinal:  Negative for abdominal pain and vomiting.  ?Genitourinary:  Negative for dysuria and hematuria.  ?Musculoskeletal:  Negative for arthralgias and back pain.  ?Skin:  Negative for color change and rash.  ?Neurological:  Negative for seizures and syncope.  ?All other systems reviewed and are negative. ? ?Physical Exam ?Updated Vital Signs ?BP 114/71   Pulse 89   Temp 98.6 ?F (37 ?C) (Oral)   Resp  19   Ht 1.626 m ('5\' 4"'$ )   Wt 65 kg   SpO2 96%   BMI 24.60 kg/m?  ?Physical Exam ?Vitals and nursing note reviewed.  ?Constitutional:   ?   General: She is not in acute distress. ?   Appearance: Normal appearance. She is well-developed. She is not ill-appearing.  ?HENT:  ?   Head: Normocephalic and atraumatic.  ?Eyes:  ?   Extraocular Movements: Extraocular movements intact.  ?   Conjunctiva/sclera: Conjunctivae normal.  ?   Pupils: Pupils are equal, round, and reactive to light.  ?Cardiovascular:  ?   Rate and Rhythm: Normal rate and regular rhythm.  ?   Heart sounds: No  murmur heard. ?Pulmonary:  ?   Effort: Pulmonary effort is normal. No respiratory distress.  ?   Breath sounds: Normal breath sounds.  ?Chest:  ?   Chest wall: No tenderness.  ?Abdominal:  ?   Palpations: Abdomen is soft.  ?   Tenderness: There is no abdominal tenderness.  ?Musculoskeletal:     ?   General: No swelling.  ?   Cervical back: Normal range of motion and neck supple.  ?   Right lower leg: No edema.  ?   Left lower leg: No edema.  ?Skin: ?   General: Skin is warm and dry.  ?   Capillary Refill: Capillary refill takes less than 2 seconds.  ?Neurological:  ?   General: No focal deficit present.  ?   Mental Status: She is alert and oriented to person, place, and time.  ?   Cranial Nerves: No cranial nerve deficit.  ?   Sensory: No sensory deficit.  ?   Motor: No weakness.  ?Psychiatric:     ?   Mood and Affect: Mood normal.  ? ? ?ED Results / Procedures / Treatments   ?Labs ?(all labs ordered are listed, but only abnormal results are displayed) ?Labs Reviewed  ?BASIC METABOLIC PANEL - Abnormal; Notable for the following components:  ?    Result Value  ? Potassium 3.4 (*)   ? Glucose, Bld 116 (*)   ? Creatinine, Ser 1.01 (*)   ? GFR, Estimated 55 (*)   ? All other components within normal limits  ?CBC - Abnormal; Notable for the following components:  ? WBC 15.2 (*)   ? Hemoglobin 11.1 (*)   ? HCT 34.9 (*)   ? All other components within normal limits  ?TROPONIN I (HIGH SENSITIVITY)  ?TROPONIN I (HIGH SENSITIVITY)  ? ? ?EKG ?EKG Interpretation ? ?Date/Time:  Tuesday April 19 2021 18:08:05 EDT ?Ventricular Rate:  99 ?PR Interval:    ?QRS Duration: 82 ?QT Interval:  354 ?QTC Calculation: 621 ?R Axis:   135 ?Text Interpretation: Atrial fibrillation Right axis deviation Pulmonary disease pattern Septal infarct , age undetermined Abnormal ECG When compared with ECG of 11-Sep-2020 17:29, PREVIOUS ECG IS PRESENT Confirmed by Fredia Sorrow [phone]) on 04/19/2021 8:33:22 PM ? ?Radiology ?DG Chest 2 View ? ?Result  Date: 04/19/2021 ?CLINICAL DATA:  chest pain EXAM: CHEST - 2 VIEW COMPARISON:  April 08, 2021. FINDINGS: Similar moderate enlargement of the cardiac silhouette. No consolidation. No visible pleural effusions or pneumothorax. Degenerative changes of the thoracic spine. Left axillary/chest wall clips. IMPRESSION: 1. No evidence of acute cardiopulmonary disease. 2. Moderate cardiomegaly. Electronically Signed   By: Margaretha Sheffield M.D.   On: 04/19/2021 18:30   ? ?Procedures ?Procedures  ? ? ?Medications Ordered in ED ?Medications - No data to  display ? ?ED Course/ Medical Decision Making/ A&P ?  ?                        ?Medical Decision Making ?Amount and/or Complexity of Data Reviewed ?Labs: ordered. ?Radiology: ordered. ? ? ?Patient without any chest pain currently.  Patient does have a history of coronary artery disease.  Initial troponin was normal at 7.  Delta troponin pending.  Labs significant for a white blood cell count of 15,000.  Platelets normal hemoglobin 11.1.  Basic metabolic panel without any significant abnormalities creatinine is 1.01 but actual GFR is actually better than usual for her.  Potassium slightly down at 3.4.  Chest x-ray no evidence of acute cardiopulmonary disease moderate cardiomegaly a.  No evidence of any significant congestive heart failure.  And patient without any leg swelling. ? ?If second troponin is without significant change feel patient is stable for discharge home follow-up with her cardiologist primary group at [facility]. ? ?Patient's symptoms do sound as if they could be reflux related.  Patient's been on Prilosec.  Patient feels as if his reflux. ? ?Second troponin unchanged at 7.  Patient stable for discharge home and follow-up with her doctors. ? ? ?Final Clinical Impression(s) / ED Diagnoses ?Final diagnoses:  ?Atypical chest pain  ? ? ?Rx / DC Orders ?ED Discharge Orders   ? ? None  ? ?  ? ? ?  ?Fredia Sorrow, MD ?04/19/21 2129 ? ?

## 2021-04-26 ENCOUNTER — Other Ambulatory Visit: Payer: Self-pay

## 2021-04-26 ENCOUNTER — Ambulatory Visit (INDEPENDENT_AMBULATORY_CARE_PROVIDER_SITE_OTHER): Payer: Medicare Other | Admitting: Orthopaedic Surgery

## 2021-04-26 ENCOUNTER — Ambulatory Visit (INDEPENDENT_AMBULATORY_CARE_PROVIDER_SITE_OTHER): Payer: Medicare Other

## 2021-04-26 DIAGNOSIS — M79602 Pain in left arm: Secondary | ICD-10-CM | POA: Diagnosis not present

## 2021-04-26 MED ORDER — TRAMADOL HCL 50 MG PO TABS
50.0000 mg | ORAL_TABLET | Freq: Two times a day (BID) | ORAL | 0 refills | Status: DC | PRN
Start: 2021-04-26 — End: 2021-10-12

## 2021-04-26 MED ORDER — PREDNISONE 5 MG (21) PO TBPK: ORAL_TABLET | ORAL | 0 refills | Status: DC

## 2021-04-26 NOTE — Progress Notes (Signed)
? ?Office Visit Note ?  ?Patient: [name]           ?Date of Birth: August 24, 1933           ?MRN: [id number] ?Visit Date: 04/26/2021 ?             ?Requested by: [place], [place], PA-C ?[address] ?[address] ?[place],  [place] [address] ?PCP: Elisabeth Cara, PA-C ? ? ?Assessment & Plan: ?Visit Diagnoses:  ?1. Left arm pain   ? ? ?Plan: Impression is chronic left-sided neck pain with left upper extremity radiculopathy.  At this point, would like to start the patient on a steroid taper in addition to a course of physical therapy.  Internal referral has been made.  If her symptoms do not improve in the next 6 to 8 weeks we will get an MRI of the cervical spine.  Follow-up with [place] as needed. ? ?Follow-Up Instructions: Return if symptoms worsen or fail to improve.  ? ?Orders:  ?Orders Placed This Encounter  ?Procedures  ? XR Cervical Spine 2 or 3 views  ? ?No orders of the defined types were placed in this encounter. ? ? ? ? Procedures: ?No procedures performed ? ? ?Clinical Data: ?No additional findings. ? ? ?Subjective: ?Chief Complaint  ?Patient presents with  ? Left Shoulder - Pain  ? ? ?HPI patient is a pleasant 88 year old female who comes in today with left-sided neck pain rating down the entire arm into the fingers.  She has been ongoing for the past few weeks.  This is constant but worse with certain motions such as left-sided rotation.  She has been taking Tylenol without relief.  She does note numbness and tingling to all 5 fingers of the left hand.  She has not been in physical therapy or had epidural steroid injections in the neck. ? ?Review of Systems as detailed in HPI.  All others are negative. ? ? ?Objective: ?Vital Signs: There were no vitals taken for this visit. ? ?Physical Exam well-developed well-nourished female no acute distress.  Alert and oriented x3. ? ?Ortho Exam cervical spine exam shows spinous and left-sided paraspinous tenderness.  Increased pain with cervical spine  flexion extension was rotation.  No focal weakness.  She is neurovascular intact distally. ? ?Specialty Comments:  ?No specialty comments available. ? ?Imaging: ?XR Cervical Spine 2 or 3 views ? ?Result Date: 04/26/2021 ?Advanced multilevel degenerative changes  ? ? ?PMFS History: ?Patient Active Problem List  ? Diagnosis Date Noted  ? CHF exacerbation ([place]) 09/12/2020  ? Hypomagnesemia 09/12/2020  ? Acute on chronic diastolic CHF (congestive heart failure) ([place]) 09/11/2020  ? OSA on CPAP   ? CKD (chronic kidney disease), stage III ([place])   ? Anemia of chronic disease   ? Hypokalemia   ? Malnutrition of moderate degree 07/09/2020  ? Chest pain of uncertain etiology   ? Unstable angina ([place]) 07/03/2020  ? Primary osteoarthritis, right ankle and foot 01/24/2018  ? Arthritis of right ankle 08/21/2017  ? Allergic urticaria 09/07/2016  ? Generalized pruritus 09/07/2016  ? Moderate persistent asthma 09/07/2016  ? Perennial and seasonal allergic rhinitis 09/07/2016  ? Chest pain 01/06/2015  ? Angina at rest [facility]) 01/06/2015  ? Diabetes ([place]) 01/06/2015  ? Persistent atrial fibrillation ([place]) 01/06/2015  ? HLD (hyperlipidemia) 01/06/2015  ? Tobacco abuse 01/06/2015  ? Type II diabetes mellitus ([place])   ? Painful total knee replacement ([place]) 01/28/2013  ? Atrial fibrillation ([place])   ? Left  shoulder pain   ? Hypertension   ? ?Past Medical History:  ?Diagnosis Date  ? Arthritis   ? "all over"  ? Atrial fibrillation ([place])   ? Diagnosed ~2009  ? Breast cancer, left breast ([place]) 02/07/1996  ? S/P chemo and mastectomy   ? CHF (congestive heart failure) ([place])   ? GERD (gastroesophageal reflux disease)   ? Hyperlipidemia   ? Hypertension   ? Left shoulder pain   ? "MRI showed pinched nerve" - per pt  ? MI (myocardial infarction) ([place])   ? OSA on CPAP   ? Pneumonia   ? "4-5 times" (01/06/2015)  ? Type II diabetes mellitus ([place])   ?  ?Family History  ?Problem Relation Age of Onset  ? Heart attack Mother   ?     45s  ? Emphysema Brother    ? Allergic rhinitis Neg Hx   ? Angioedema Neg Hx   ? Asthma Neg Hx   ? Eczema Neg Hx   ? Immunodeficiency Neg Hx   ? Urticaria Neg Hx   ?  ?Past Surgical History:  ?Procedure Laterality Date  ? BREAST BIOPSY Left 1998  ? CATARACT EXTRACTION W/ INTRAOCULAR LENS  IMPLANT, BILATERAL Bilateral   ? JOINT REPLACEMENT    ? LAPAROSCOPIC CHOLECYSTECTOMY    ? LEFT HEART CATH AND CORONARY ANGIOGRAPHY N/A 07/07/2020  ? Procedure: LEFT HEART CATH AND CORONARY ANGIOGRAPHY;  Surgeon: Jettie Booze, MD;  Location: [facility] CV LAB;  Service: Cardiovascular;  Laterality: N/A;  ? MASTECTOMY Left 1998  ? TOTAL KNEE ARTHROPLASTY Left 2001  ? TOTAL KNEE REVISION Left 01/28/2013  ? Procedure: LEFT TOTAL KNEE ARTHROPLASTY REVISION;  Surgeon: Marianna Payment, MD;  Location: [facility];  Service: Orthopedics;  Laterality: Left;  ? ?Social History  ? ?Occupational History  ? Not on file  ?Tobacco Use  ? Smoking status: Never  ? Smokeless tobacco: Current  ?  Types: Snuff  ?Vaping Use  ? Vaping Use: Never used  ?Substance and Sexual Activity  ? Alcohol use: Not Currently  ?  Comment: 01/06/2015 "I'll have a beer q once in awhile"  ? Drug use: No  ? Sexual activity: Never  ? ? ? ? ? ? ?

## 2021-05-04 ENCOUNTER — Other Ambulatory Visit (HOSPITAL_BASED_OUTPATIENT_CLINIC_OR_DEPARTMENT_OTHER): Payer: Self-pay | Admitting: Family

## 2021-05-04 DIAGNOSIS — I5032 Chronic diastolic (congestive) heart failure: Secondary | ICD-10-CM

## 2021-05-04 DIAGNOSIS — I272 Pulmonary hypertension, unspecified: Secondary | ICD-10-CM

## 2021-05-31 ENCOUNTER — Ambulatory Visit (INDEPENDENT_AMBULATORY_CARE_PROVIDER_SITE_OTHER): Payer: Medicare Other | Admitting: Orthopaedic Surgery

## 2021-05-31 ENCOUNTER — Encounter: Payer: Self-pay | Admitting: Orthopaedic Surgery

## 2021-05-31 DIAGNOSIS — M5412 Radiculopathy, cervical region: Secondary | ICD-10-CM | POA: Diagnosis not present

## 2021-05-31 MED ORDER — HYDROCODONE-ACETAMINOPHEN 5-325 MG PO TABS: 1.0000 | ORAL_TABLET | Freq: Two times a day (BID) | ORAL | 0 refills | Status: DC | PRN

## 2021-05-31 NOTE — Progress Notes (Signed)
? ?Office Visit Note ?  ?Patient: [name]           ?Date of Birth: January 20, 1934           ?MRN: [id number] ?Visit Date: 05/31/2021 ?             ?Requested by: [place], [place], PA-C ?[address] ?[address] ?[place],  [place] [address] ?PCP: Elisabeth Cara, PA-C ? ? ?Assessment & Plan: ?Visit Diagnoses:  ?1. Radiculopathy of cervical spine   ? ? ?Plan: Impression is cervical spine radiculopathy left upper extremity.  She has not noticed any relief in symptoms from physical therapy, steroids or muscle relaxers.  I would like to go ahead and order an MRI of the cervical spine to assess for structural abnormalities.  She will follow-up with [place] once completed.  Call with concerns or questions in the meantime. ? ?Follow-Up Instructions: Return for after MRI.  ? ?Orders:  ?No orders of the defined types were placed in this encounter. ? ?Meds ordered this encounter  ?Medications  ? HYDROcodone-acetaminophen (NORCO) 5-325 MG tablet  ?  Sig: Take 1 tablet by mouth 2 (two) times daily as needed.  ?  Dispense:  20 tablet  ?  Refill:  0  ? ? ? ? Procedures: ?No procedures performed ? ? ?Clinical Data: ?No additional findings. ? ? ?Subjective: ?Chief Complaint  ?Patient presents with  ? Left Shoulder - Pain  ? ? ?HPI patient is a pleasant 88 year old female who comes in today with chronic left-sided neck pain left upper extremity radiculopathy.  She was seen by [place] over a month ago for this issue where she was started in physical therapy.  She was also prescribed steroids and muscle relaxers.  She has not noticed any relief with the medication or therapy.  She continues to have pain to the left side of her neck as well as radicular symptoms to the left upper extremity. ? ?Review of Systems as detailed in HPI.  All others reviewed are negative. ? ? ?Objective: ?Vital Signs: There were no vitals taken for this visit. ? ?Physical Exam well-developed well-nourished female no acute distress.  Alert and oriented  x3. ? ?Ortho Exam cervical spine exam shows left-sided paraspinous tenderness with increased pain with flexion and extension.  No focal weakness.  She is neurovascular intact distally. ? ?Specialty Comments:  ?No specialty comments available. ? ?Imaging: ?No new imaging ? ? ?PMFS History: ?Patient Active Problem List  ? Diagnosis Date Noted  ? CHF exacerbation ([place]) 09/12/2020  ? Hypomagnesemia 09/12/2020  ? Acute on chronic diastolic CHF (congestive heart failure) ([place]) 09/11/2020  ? OSA on CPAP   ? CKD (chronic kidney disease), stage III ([place])   ? Anemia of chronic disease   ? Hypokalemia   ? Malnutrition of moderate degree 07/09/2020  ? Chest pain of uncertain etiology   ? Unstable angina ([place]) 07/03/2020  ? Primary osteoarthritis, right ankle and foot 01/24/2018  ? Arthritis of right ankle 08/21/2017  ? Allergic urticaria 09/07/2016  ? Generalized pruritus 09/07/2016  ? Moderate persistent asthma 09/07/2016  ? Perennial and seasonal allergic rhinitis 09/07/2016  ? Chest pain 01/06/2015  ? Angina at rest [facility]) 01/06/2015  ? Diabetes ([place]) 01/06/2015  ? Persistent atrial fibrillation ([place]) 01/06/2015  ? HLD (hyperlipidemia) 01/06/2015  ? Tobacco abuse 01/06/2015  ? Type II diabetes mellitus ([place])   ? Painful total knee replacement ([place]) 01/28/2013  ? Atrial fibrillation ([place])   ? Left shoulder pain   ?  Hypertension   ? ?Past Medical History:  ?Diagnosis Date  ? Arthritis   ? "all over"  ? Atrial fibrillation ([place])   ? Diagnosed ~2009  ? Breast cancer, left breast ([place]) 02/07/1996  ? S/P chemo and mastectomy   ? CHF (congestive heart failure) ([place])   ? GERD (gastroesophageal reflux disease)   ? Hyperlipidemia   ? Hypertension   ? Left shoulder pain   ? "MRI showed pinched nerve" - per pt  ? MI (myocardial infarction) ([place])   ? OSA on CPAP   ? Pneumonia   ? "4-5 times" (01/06/2015)  ? Type II diabetes mellitus ([place])   ?  ?Family History  ?Problem Relation Age of Onset  ? Heart attack Mother   ?     17s  ? Emphysema  Brother   ? Allergic rhinitis Neg Hx   ? Angioedema Neg Hx   ? Asthma Neg Hx   ? Eczema Neg Hx   ? Immunodeficiency Neg Hx   ? Urticaria Neg Hx   ?  ?Past Surgical History:  ?Procedure Laterality Date  ? BREAST BIOPSY Left 1998  ? CATARACT EXTRACTION W/ INTRAOCULAR LENS  IMPLANT, BILATERAL Bilateral   ? JOINT REPLACEMENT    ? LAPAROSCOPIC CHOLECYSTECTOMY    ? LEFT HEART CATH AND CORONARY ANGIOGRAPHY N/A 07/07/2020  ? Procedure: LEFT HEART CATH AND CORONARY ANGIOGRAPHY;  Surgeon: Jettie Booze, MD;  Location: [facility];  Service: Cardiovascular;  Laterality: N/A;  ? MASTECTOMY Left 1998  ? TOTAL KNEE ARTHROPLASTY Left 2001  ? TOTAL KNEE REVISION Left 01/28/2013  ? Procedure: LEFT TOTAL KNEE ARTHROPLASTY REVISION;  Surgeon: Marianna Payment, MD;  Location: [facility];  Service: Orthopedics;  Laterality: Left;  ? ?Social History  ? ?Occupational History  ? Not on file  ?Tobacco Use  ? Smoking status: Never  ? Smokeless tobacco: Current  ?  Types: Snuff  ?Vaping Use  ? Vaping Use: Never used  ?Substance and Sexual Activity  ? Alcohol use: Not Currently  ?  Comment: 01/06/2015 "I'll have a beer q once in awhile"  ? Drug use: No  ? Sexual activity: Never  ? ? ? ? ? ? ?

## 2021-06-01 ENCOUNTER — Other Ambulatory Visit: Payer: Self-pay

## 2021-06-01 DIAGNOSIS — M5412 Radiculopathy, cervical region: Secondary | ICD-10-CM

## 2021-06-20 ENCOUNTER — Ambulatory Visit
Admission: RE | Admit: 2021-06-20 | Discharge: 2021-06-20 | Disposition: A | Discharge status: Home / Self Care | Payer: Medicare Other | Source: Ambulatory Visit | Attending: Orthopaedic Surgery | Admitting: Orthopaedic Surgery

## 2021-06-20 DIAGNOSIS — M5412 Radiculopathy, cervical region: Secondary | ICD-10-CM

## 2021-06-29 ENCOUNTER — Ambulatory Visit (INDEPENDENT_AMBULATORY_CARE_PROVIDER_SITE_OTHER): Payer: Medicare Other | Admitting: Cardiovascular Disease

## 2021-06-29 ENCOUNTER — Encounter: Payer: Self-pay | Admitting: Cardiovascular Disease

## 2021-06-29 VITALS — BP 124/60 | HR 66 | Ht 64.0 in | Wt 145.6 lb

## 2021-06-29 DIAGNOSIS — I2510 Atherosclerotic heart disease of native coronary artery without angina pectoris: Secondary | ICD-10-CM

## 2021-06-29 DIAGNOSIS — I4819 Other persistent atrial fibrillation: Secondary | ICD-10-CM | POA: Diagnosis not present

## 2021-06-29 DIAGNOSIS — I10 Essential (primary) hypertension: Secondary | ICD-10-CM

## 2021-06-29 DIAGNOSIS — I5032 Chronic diastolic (congestive) heart failure: Secondary | ICD-10-CM

## 2021-06-29 DIAGNOSIS — E782 Mixed hyperlipidemia: Secondary | ICD-10-CM

## 2021-06-29 MED ORDER — AMLODIPINE BESYLATE 5 MG PO TABS: 5.0000 mg | ORAL_TABLET | Freq: Every day | ORAL | 3 refills | Status: DC

## 2021-06-29 NOTE — Patient Instructions (Signed)
Medication Instructions:  DECREASE Amlodipine to '5mg'$  daily *If you need a refill on your cardiac medications before your next appointment, please call your pharmacy*   Lab Work: NONE If you have labs (blood work) drawn today and your tests are completely normal, you will receive your results only by: [place] (if you have MyChart) OR A paper copy in the mail If you have any lab test that is abnormal or we need to change your treatment, we will call you to review the results.   Testing/Procedures: ECHO Your physician has requested that you have an echocardiogram. Echocardiography is a painless test that uses sound waves to create images of your heart. It provides your doctor with information about the size and shape of your heart and how well your heart's chambers and valves are working. This procedure takes approximately one hour. There are no restrictions for this procedure.  Chest X-ray A chest x-ray takes a picture of the organs and structures inside the chest, including the heart, lungs, and blood vessels. This test can show several things, including, whether the heart is enlarges; whether fluid is building up in the lungs; and whether pacemaker / defibrillator leads are still in place.   Follow-Up: At [facility], you and your health needs are our priority.  As part of our continuing mission to provide you with exceptional heart care, we have created designated Provider Care Teams.  These Care Teams include your primary Cardiologist (physician) and Advanced Practice Providers (APPs -  Physician Assistants and Nurse Practitioners) who all work together to provide you with the care you need, when you need it.  We recommend signing up for the patient portal called "MyChart".  Sign up information is provided on this After Visit Summary.  MyChart is used to connect with patients for Virtual Visits (Telemedicine).  Patients are able to view lab/test results, encounter notes, upcoming  appointments, etc.  Non-urgent messages can be sent to your provider as well.   To learn more about what you can do with MyChart, go to [email or web address].    Your next appointment:   6 month(s)  The format for your next appointment:   In Person  Provider:   Robbie Lis, PA-C, Christen Bame, NP, or Richardson Dopp, PA-C     Then, Sherren Mocha, MD will plan to see you again in 1 year(s).    Other Instructions   For your  leg edema you  should do  the following 1. Leg elevation - I recommend the [address]ounge Dr. Leg rest.  See below for details  2. Salt restriction  -  Use potassium chloride instead of regular salt as a salt substitute. 3. Walk regularly 4. Compression hose - [employer] store  5. Weight loss    Available on [email or web address] Or  Go to [email or web address]      Important Information About Sugar

## 2021-06-29 NOTE — Progress Notes (Signed)
Cardiology Office Note:    Date:  06/29/2021   ID:  [name], DOB 1933/12/15, MRN [id number]  PCP:  Elisabeth Cara, PA-C   [facility] Providers Cardiologist:  Sherren Mocha, MD     Referring MD: Elisabeth Cara, *   Chief Complaint  Patient presents with   Hypertension    History of Present Illness:    [name] is a 88 y.o. female with a hx of permanent atrial fibrillation, nonobstructive CAD, and hypertension, presenting for follow-up evaluation.  The patient underwent cardiac catheterization in 2022 when she presented with non-STEMI.  She was found to have nonobstructive coronary artery disease with recommendation for medical therapy.  LVEF was normal at 55 to 60%.  The patient had recurrent ER evaluation and hospital admission for acute on chronic diastolic heart failure.  At the time of her last office visit in November 2022 she was felt to be clinically stable and doing well.  The patient is here with her son today.  She has had some issues with diastolic heart failure over the last year, but otherwise has done reasonably well.  She does complain of swelling in her feet.  She has not had any recent problems with orthopnea or PND.  She has mild exertional dyspnea and exercise intolerance with generalized fatigue.  She denies any recent chest pain.  She is compliant with furosemide 20 mg daily and states that she urinates frequently after taking this.  Past Medical History:  Diagnosis Date   Arthritis    "all over"   Atrial fibrillation [facility])    Diagnosed ~2009   Breast cancer, left breast ([place]) 02/07/1996   S/P chemo and mastectomy    CHF (congestive heart failure) (HCC)    GERD (gastroesophageal reflux disease)    Hyperlipidemia    Hypertension    Left shoulder pain    "MRI showed pinched nerve" - per pt   MI (myocardial infarction) ([place])    OSA on CPAP    Pneumonia    "4-5 times" (01/06/2015)   Type II diabetes mellitus ([place])      Past Surgical History:  Procedure Laterality Date   BREAST BIOPSY Left 1998   CATARACT EXTRACTION W/ INTRAOCULAR LENS  IMPLANT, BILATERAL Bilateral    JOINT REPLACEMENT     LAPAROSCOPIC CHOLECYSTECTOMY     LEFT HEART CATH AND CORONARY ANGIOGRAPHY N/A 07/07/2020   Procedure: LEFT HEART CATH AND CORONARY ANGIOGRAPHY;  Surgeon: Jettie Booze, MD;  Location: [place] [facility];  Service: Cardiovascular;  Laterality: N/A;   MASTECTOMY Left 1998   TOTAL KNEE ARTHROPLASTY Left 2001   TOTAL KNEE REVISION Left 01/28/2013   Procedure: LEFT TOTAL KNEE ARTHROPLASTY REVISION;  Surgeon: Marianna Payment, MD;  Location: [place];  Service: Orthopedics;  Laterality: Left;    Current Medications: Current Meds  Medication Sig   albuterol (VENTOLIN HFA) 108 (90 Base) MCG/ACT inhaler Inhale 2 puffs into the lungs every 4 (four) hours as needed for wheezing or shortness of breath.   amLODipine (NORVASC) 5 MG tablet Take 1 tablet (5 mg total) by mouth daily.   atorvastatin (LIPITOR) 40 MG tablet TAKE 1 TABLET BY MOUTH EVERY DAY   Cholecalciferol (VITAMIN D) 2000 units tablet Take 2,000 Units by mouth daily.   FLOVENT HFA 110 MCG/ACT inhaler TAKE 2 PUFFS BY MOUTH TWICE A DAY   fluticasone (FLONASE) 50 MCG/ACT nasal spray Place 1 spray into both nostrils as needed.   furosemide (LASIX) 20  MG tablet TAKE 1 TABLET BY MOUTH EVERY DAY   gabapentin (NEURONTIN) 100 MG capsule Take 100-200 mg by mouth at bedtime.   ketorolac (ACULAR) 0.5 % ophthalmic solution Place into both eyes daily.   liver oil-zinc oxide (DESITIN) 40 % ointment Apply topically as needed for irritation.   metoprolol tartrate (LOPRESSOR) 25 MG tablet TAKE 1 TABLET BY MOUTH TWICE A DAY   omeprazole (PRILOSEC) 40 MG capsule Take 1 capsule (40 mg total) by mouth daily.   Rivaroxaban (XARELTO) 15 MG TABS tablet Take 1 tablet (15 mg total) by mouth daily with supper.   traMADol (ULTRAM) 50 MG tablet Take 1 tablet (50 mg total) by mouth every  12 (twelve) hours as needed.   [DISCONTINUED] amLODipine (NORVASC) 10 MG tablet Take 10 mg by mouth daily.     Allergies:   Ferumoxytol, Chocolate, Lisinopril, and Peanuts [peanut oil]   Social History   Socioeconomic History   Marital status: Widowed    Spouse name: Not on file   Number of children: Not on file   Years of education: Not on file   Highest education level: Not on file  Occupational History   Not on file  Tobacco Use   Smoking status: Never   Smokeless tobacco: Current    Types: Snuff  Vaping Use   Vaping Use: Never used  Substance and Sexual Activity   Alcohol use: Not Currently    Comment: 01/06/2015 "I'll have a beer q once in awhile"   Drug use: No   Sexual activity: Never  Other Topics Concern   Not on file  Social History Narrative   Moved from the [place] area to [place] ~ [place]yr ago to live with her daughter   Social Determinants of Health   Financial Resource Strain: Not on file  Food Insecurity: Not on file  Transportation Needs: Not on file  Physical Activity: Not on file  Stress: Not on file  Social Connections: Not on file     Family History: The patient's family history includes Emphysema in her brother; Heart attack in her mother. There is no history of Allergic rhinitis, Angioedema, Asthma, Eczema, Immunodeficiency, or Urticaria.  ROS:   Please see the history of present illness.    All other systems reviewed and are negative.  EKGs/Labs/Other Studies Reviewed:    The following studies were reviewed today: Echo 07/04/20: 1. Left ventricular ejection fraction, by estimation, is 55 to 60%. The  left ventricle has normal function. The left ventricle has no regional  wall motion abnormalities. There is mild left ventricular hypertrophy.  Left ventricular diastolic parameters  are indeterminate.   2. Right ventricular systolic function is mildly reduced. The right  ventricular size is severely enlarged. There is mildly elevated  pulmonary  artery systolic pressure.   3. Left atrial size was moderately dilated.   4. Right atrial size was severely dilated.   5. The mitral valve is normal in structure. Mild mitral valve  regurgitation. No evidence of mitral stenosis.   6. Tricuspid valve regurgitation is severe.   7. The aortic valve is tricuspid. Aortic valve regurgitation is trivial.  Mild aortic valve sclerosis is present, with no evidence of aortic valve  stenosis.   8. Aortic dilatation noted. There is mild dilatation of the ascending  aorta, measuring 40 mm.   9. The inferior vena cava is dilated in size with <50% respiratory  variability, suggesting right atrial pressure of 15 mmHg.   EKG:  EKG is  not ordered today.    Recent Labs: 09/11/2020: B Natriuretic Peptide 487.3 09/13/2020: Magnesium 2.2 12/22/2020: ALT 14 04/19/2021: BUN 12; Creatinine, Ser 1.01; Hemoglobin 11.1; Platelets 358; Potassium 3.4; Sodium 136  Recent Lipid Panel    Component Value Date/Time   CHOL 148 07/04/2020 0341   TRIG 52 07/04/2020 0341   HDL 53 07/04/2020 0341   CHOLHDL 2.8 07/04/2020 0341   VLDL 10 07/04/2020 0341   LDLCALC 85 07/04/2020 0341   LDLDIRECT 46 12/22/2020 1549     Risk Assessment/Calculations:    CHA2DS2-VASc Score = 5   This indicates a 7.2% annual risk of stroke. The patient's score is based upon: CHF History: 1 HTN History: 1 Diabetes History: 0 Stroke History: 0 Vascular Disease History: 0 Age Score: 2 Gender Score: 1          Physical Exam:    VS:  BP 124/60   Pulse 66   Ht '5\' 4"'$  (1.626 m)   Wt 145 lb 9.6 oz (66 kg)   SpO2 98%   BMI 24.99 kg/m     Wt Readings from Last 3 Encounters:  06/29/21 145 lb 9.6 oz (66 kg)  04/19/21 143 lb 4.8 oz (65 kg)  12/22/20 143 lb 3.2 oz (65 kg)     GEN:  Well nourished, well developed in no acute distress HEENT: Normal NECK: No JVD; No carotid bruits LYMPHATICS: No lymphadenopathy CARDIAC: irregularly irregular, no murmurs, rubs,  gallops RESPIRATORY:  Clear to auscultation without rales, wheezing or rhonchi  ABDOMEN: Soft, non-tender, non-distended MUSCULOSKELETAL: No pretibial edema, 1+ left pedal edema, trace right pedal edema; No deformity  SKIN: Warm and dry NEUROLOGIC:  Alert and oriented x 3 PSYCHIATRIC:  Normal affect   ASSESSMENT:    1. Chronic diastolic heart failure ([place])   2. Coronary artery disease involving native coronary artery of native heart without angina pectoris   3. Persistent atrial fibrillation ([place])   4. Essential hypertension   5. Mixed hyperlipidemia    PLAN:    In order of problems listed above:  I recommended an updated 2D echocardiogram to assess for any interval decline in LV function in the setting of her chronic atrial fibrillation.  She will continue on furosemide 20 mg daily.  Overall appears well compensated. No anginal symptoms.  Not on antiplatelet therapy because of chronic oral anticoagulation.  Continue beta-blockade with metoprolol.  Continue atorvastatin. Heart rate is controlled.  Continue current therapy.  Continue rivaroxaban for anticoagulation. Blood pressure is controlled.  I suspect amlodipine is contributing to her pedal edema.  I recommended to reduce her dose from 10 down to 5 mg daily. Treated with a high intensity statin drug.           Medication Adjustments/Labs and Tests Ordered: Current medicines are reviewed at length with the patient today.  Concerns regarding medicines are outlined above.  Orders Placed This Encounter  Procedures   DG Chest 2 View   ECHOCARDIOGRAM COMPLETE   Meds ordered this encounter  Medications   amLODipine (NORVASC) 5 MG tablet    Sig: Take 1 tablet (5 mg total) by mouth daily.    Dispense:  90 tablet    Refill:  3    Dose DECREASE    Patient Instructions  Medication Instructions:  DECREASE Amlodipine to '5mg'$  daily *If you need a refill on your cardiac medications before your next appointment, please call your  pharmacy*   Lab Work: NONE If you have labs (blood work) drawn today  and your tests are completely normal, you will receive your results only by: MyChart Message (if you have MyChart) OR A paper copy in the mail If you have any lab test that is abnormal or we need to change your treatment, we will call you to review the results.   Testing/Procedures: ECHO Your physician has requested that you have an echocardiogram. Echocardiography is a painless test that uses sound waves to create images of your heart. It provides your doctor with information about the size and shape of your heart and how well your heart's chambers and valves are working. This procedure takes approximately one hour. There are no restrictions for this procedure.  Chest X-ray A chest x-ray takes a picture of the organs and structures inside the chest, including the heart, lungs, and blood vessels. This test can show several things, including, whether the heart is enlarges; whether fluid is building up in the lungs; and whether pacemaker / defibrillator leads are still in place.   Follow-Up: At [facility], you and your health needs are our priority.  As part of our continuing mission to provide you with exceptional heart care, we have created designated Provider Care Teams.  These Care Teams include your primary Cardiologist (physician) and Advanced Practice Providers (APPs -  Physician Assistants and Nurse Practitioners) who all work together to provide you with the care you need, when you need it.  We recommend signing up for the patient portal called "MyChart".  Sign up information is provided on this After Visit Summary.  MyChart is used to connect with patients for Virtual Visits (Telemedicine).  Patients are able to view lab/test results, encounter notes, upcoming appointments, etc.  Non-urgent messages can be sent to your provider as well.   To learn more about what you can do with MyChart, go to  [email or web address].    Your next appointment:   6 month(s)  The format for your next appointment:   In Person  Provider:   Robbie Lis, PA-C, Christen Bame, NP, or Richardson Dopp, PA-C     Then, Sherren Mocha, MD will plan to see you again in 1 year(s).    Other Instructions   For your  leg edema you  should do  the following 1. Leg elevation - I recommend the Lounge Dr. Leg rest.  See below for details  2. Salt restriction  -  Use potassium chloride instead of regular salt as a salt substitute. 3. Walk regularly 4. Compression hose - [employer] store  5. Weight loss    Available on [email or web address] Or  Go to [email or web address]      Important Information About Sugar         Signed, Sherren Mocha, MD  06/29/2021 5:23 PM    [place]

## 2021-07-05 ENCOUNTER — Ambulatory Visit (INDEPENDENT_AMBULATORY_CARE_PROVIDER_SITE_OTHER): Payer: Medicare Other | Admitting: Orthopaedic Surgery

## 2021-07-05 ENCOUNTER — Encounter: Payer: Self-pay | Admitting: Orthopaedic Surgery

## 2021-07-05 ENCOUNTER — Other Ambulatory Visit: Payer: Self-pay | Admitting: Physician Assistant

## 2021-07-05 ENCOUNTER — Ambulatory Visit
Admission: RE | Admit: 2021-07-05 | Discharge: 2021-07-05 | Disposition: A | Discharge status: Home / Self Care | Payer: Medicare Other | Source: Ambulatory Visit | Attending: Cardiovascular Disease | Admitting: Cardiovascular Disease

## 2021-07-05 DIAGNOSIS — I2510 Atherosclerotic heart disease of native coronary artery without angina pectoris: Secondary | ICD-10-CM | POA: Diagnosis not present

## 2021-07-05 DIAGNOSIS — I5032 Chronic diastolic (congestive) heart failure: Secondary | ICD-10-CM

## 2021-07-05 DIAGNOSIS — M5412 Radiculopathy, cervical region: Secondary | ICD-10-CM | POA: Diagnosis not present

## 2021-07-05 MED ORDER — HYDROCODONE-ACETAMINOPHEN 5-325 MG PO TABS: 1.0000 | ORAL_TABLET | Freq: Two times a day (BID) | ORAL | 0 refills | Status: DC | PRN

## 2021-07-05 NOTE — Progress Notes (Signed)
Office Visit Note   Patient: [name]           Date of Birth: 04-01-1933           MRN: [id number] Visit Date: 07/05/2021              Requested by: Elisabeth Cara, [address] [place],  [place] [address] PCP: Belva Bertin, [place], [place]   Assessment & Plan: Visit Diagnoses:  1. Radiculopathy of cervical spine     Plan: Impression is chronic neck and left upper extremity pain and radiculopathy with underlying cervical spinal canal and facet stenosis.  At this point, would like to refer the patient to Dr. Ernestina Patches for [facility] versus facet block.  Patient agrees and would like to proceed.  Follow-up as needed.  Follow-Up Instructions: Return if symptoms worsen or fail to improve.   Orders:  No orders of the defined types were placed in this encounter.  No orders of the defined types were placed in this encounter.     Procedures: No procedures performed   Clinical Data: No additional findings.   Subjective: Chief Complaint  Patient presents with   Neck - Pain    HPI patient is a pleasant 88 year old female who comes in today to discuss MRI results of her cervical spine.  She has had chronic neck and left upper extremity pain and radiculopathy for a while.  She has tried steroid tapers, muscle laxer as well as formal physical therapy all without relief.  Recent MRI of the cervical spine from 06/22/2021 shows severe spinal stenosis C3-4 and moderate spinal stenosis C4-5 in addition to moderate to severe foraminal stenosis worse C3-4 and C7-T1.  No previous ESI or facet block that she can remember.     Objective: Vital Signs: There were no vitals taken for this visit.    Ortho Exam unchanged cervical spine exam  Specialty Comments:  No specialty comments available.  Imaging: No new imaging   PMFS History: Patient Active Problem List   Diagnosis Date Noted   CHF exacerbation ([place]) 09/12/2020   Hypomagnesemia 09/12/2020   Acute  on chronic diastolic CHF (congestive heart failure) ([place]) 09/11/2020   OSA on CPAP    CKD (chronic kidney disease), stage III (HCC)    Anemia of chronic disease    Hypokalemia    Malnutrition of moderate degree 07/09/2020   Chest pain of uncertain etiology    Unstable angina ([place]) 07/03/2020   Primary osteoarthritis, right ankle and foot 01/24/2018   Arthritis of right ankle 08/21/2017   Allergic urticaria 09/07/2016   Generalized pruritus 09/07/2016   Moderate persistent asthma 09/07/2016   Perennial and seasonal allergic rhinitis 09/07/2016   Chest pain 01/06/2015   Angina at rest [facility]) 01/06/2015   Diabetes ([place]) 01/06/2015   Persistent atrial fibrillation ([place]) 01/06/2015   HLD (hyperlipidemia) 01/06/2015   Tobacco abuse 01/06/2015   Type II diabetes mellitus ([place])    Painful total knee replacement ([place]) 01/28/2013   Atrial fibrillation (HCC)    Left shoulder pain    Hypertension    Past Medical History:  Diagnosis Date   Arthritis    "all over"   Atrial fibrillation ([place])    Diagnosed ~2009   Breast cancer, left breast ([place]) 02/07/1996   S/P chemo and mastectomy    CHF (congestive heart failure) (HCC)    GERD (gastroesophageal reflux disease)    Hyperlipidemia    Hypertension    Left shoulder pain    "  MRI showed pinched nerve" - per pt   MI (myocardial infarction) ([place])    OSA on CPAP    Pneumonia    "4-5 times" (01/06/2015)   Type II diabetes mellitus ([place])     Family History  Problem Relation Age of Onset   Heart attack Mother        75s   Emphysema Brother    Allergic rhinitis Neg Hx    Angioedema Neg Hx    Asthma Neg Hx    Eczema Neg Hx    Immunodeficiency Neg Hx    Urticaria Neg Hx     Past Surgical History:  Procedure Laterality Date   BREAST BIOPSY Left 1998   CATARACT EXTRACTION W/ INTRAOCULAR LENS  IMPLANT, BILATERAL Bilateral    JOINT REPLACEMENT     LAPAROSCOPIC CHOLECYSTECTOMY     LEFT HEART CATH AND CORONARY ANGIOGRAPHY N/A 07/07/2020    Procedure: LEFT HEART CATH AND CORONARY ANGIOGRAPHY;  Surgeon: Jettie Booze, MD;  Location: [place] [facility];  Service: Cardiovascular;  Laterality: N/A;   MASTECTOMY Left 1998   TOTAL KNEE ARTHROPLASTY Left 2001   TOTAL KNEE REVISION Left 01/28/2013   Procedure: LEFT TOTAL KNEE ARTHROPLASTY REVISION;  Surgeon: Marianna Payment, MD;  Location: [place];  Service: Orthopedics;  Laterality: Left;   Social History   Occupational History   Not on file  Tobacco Use   Smoking status: Never   Smokeless tobacco: Current    Types: Snuff  Vaping Use   Vaping Use: Never used  Substance and Sexual Activity   Alcohol use: Not Currently    Comment: 01/06/2015 "I'll have a beer q once in awhile"   Drug use: No   Sexual activity: Never

## 2021-07-06 ENCOUNTER — Other Ambulatory Visit: Payer: Self-pay

## 2021-07-06 DIAGNOSIS — M5412 Radiculopathy, cervical region: Secondary | ICD-10-CM

## 2021-07-21 ENCOUNTER — Ambulatory Visit (INDEPENDENT_AMBULATORY_CARE_PROVIDER_SITE_OTHER): Payer: Medicare Other | Admitting: Physical Medicine and Rehabilitation

## 2021-07-21 ENCOUNTER — Encounter: Payer: Self-pay | Admitting: Physical Medicine and Rehabilitation

## 2021-07-21 VITALS — BP 126/76 | HR 92

## 2021-07-21 DIAGNOSIS — M5412 Radiculopathy, cervical region: Secondary | ICD-10-CM

## 2021-07-21 DIAGNOSIS — R269 Unspecified abnormalities of gait and mobility: Secondary | ICD-10-CM

## 2021-07-21 DIAGNOSIS — M79642 Pain in left hand: Secondary | ICD-10-CM

## 2021-07-21 DIAGNOSIS — M79641 Pain in right hand: Secondary | ICD-10-CM | POA: Diagnosis not present

## 2021-07-21 DIAGNOSIS — I2510 Atherosclerotic heart disease of native coronary artery without angina pectoris: Secondary | ICD-10-CM

## 2021-07-21 DIAGNOSIS — M4802 Spinal stenosis, cervical region: Secondary | ICD-10-CM | POA: Diagnosis not present

## 2021-07-21 MED ORDER — HYDROCODONE-ACETAMINOPHEN 5-325 MG PO TABS: 1.0000 | ORAL_TABLET | Freq: Three times a day (TID) | ORAL | 0 refills | Status: DC | PRN

## 2021-07-21 NOTE — Progress Notes (Unsigned)
Pt state neck pain that travels down her left shoulder, arm and hand. Pt state turning her head and lifting her arm makes the pain worse. Pt state her hand goes numb and falls alseep, pt also swellon in her left hand Pt state she takes pian meds and uses heat to help ease her pain.  Numeric Pain Rating Scale and Functional Assessment Average Pain 10 Pain Right Now 8 My pain is constant, burning, dull, stabbing, tingling, and aching Pain is worse with: some activites and lifting, turning her head. Pain improves with: heat/ice and medication   In the last MONTH (on 0-10 scale) has pain interfered with the following?  1. General activity like being  able to carry out your everyday physical activities such as walking, climbing stairs, carrying groceries, or moving a chair?  Rating(5)  2. Relation with others like being able to carry out your usual social activities and roles such as  activities at home, at work and in your community. Rating(6)  3. Enjoyment of life such that you have  been bothered by emotional problems such as feeling anxious, depressed or irritable?  Rating(7)
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
## 2021-07-25 ENCOUNTER — Telehealth: Payer: Self-pay | Admitting: Cardiovascular Disease

## 2021-07-25 NOTE — Telephone Encounter (Signed)
Patient's daughter returned call received regarding 6/20 echo. Reached out to echo scheduler to see what this call was regarding, but no response. Please advise when able.

## 2021-07-26 ENCOUNTER — Telehealth: Payer: Self-pay | Admitting: Cardiovascular Disease

## 2021-07-26 ENCOUNTER — Ambulatory Visit (HOSPITAL_COMMUNITY): Payer: Medicare Other | Attending: Internal Medicine

## 2021-07-26 DIAGNOSIS — I5032 Chronic diastolic (congestive) heart failure: Secondary | ICD-10-CM | POA: Insufficient documentation

## 2021-07-26 LAB — ECHOCARDIOGRAM COMPLETE
Area-P 1/2: 2.62 cm2
MV M vel: 2.53 m/s
MV Peak grad: 25.6 mmHg
P 1/2 time: 569 msec
S' Lateral: 3.2 cm

## 2021-07-26 NOTE — Telephone Encounter (Signed)
   Pre-operative Risk Assessment    Patient Name: [name]  DOB: 1933-06-21 MRN: [id number]      Request for Surgical Clearance    Procedure:   Interlaminar of the thoracic area   Date of Surgery:  Clearance TBD                                 Surgeon:  Dr. Ernestina Patches Surgeon's Group or Practice Name:  [facility] Phone number:  [phone] Fax number:  [phone]   Type of Clearance Requested:   - Pharmacy:  Hold Rivaroxaban (Xarelto) Needs to stop it 2 days prior to her procedure    Type of Anesthesia:   none   Additional requests/questions:      Marquita Palms   07/26/2021, 10:12 AM

## 2021-07-27 NOTE — Telephone Encounter (Signed)
This is a steroid injection.

## 2021-07-28 NOTE — Telephone Encounter (Signed)
   Name: [name]  DOB: 1933/04/09  MRN: [id number]   Primary Cardiologist: Sherren Mocha, MD  Chart reviewed as part of pre-operative protocol coverage. We were contacted regarding guidance to hold xarelto. Per our clinical pharmacist:  Per office protocol, patient can hold Xarelto for 3 days prior to procedure.   Patient will not need bridging with Lovenox (enoxaparin) around procedure.  We require a 3 day anticoagulation hold for all spinal procedures.  I will route this recommendation to the requesting party via Epic fax function and remove from pre-op pool. Please call with questions.  Tami Lin Dellas Guard, PA 07/28/2021, 7:44 AM

## 2021-07-28 NOTE — Telephone Encounter (Signed)
Patient with diagnosis of atrial fibrillation on Xarelto for anticoagulation.    Procedure: interlaminar of thoracic are (steroid injection) Date of procedure: TBD   CHA2DS2-VASc Score = 6   This indicates a 9.7% annual risk of stroke. The patient's score is based upon: CHF History: 1 HTN History: 1 Diabetes History: 1 Stroke History: 0 Vascular Disease History: 0 Age Score: 2 Gender Score: 1    CrCl 44 Platelet count 286  Per office protocol, patient can hold Xarelto for 3 days prior to procedure.   Patient will not need bridging with Lovenox (enoxaparin) around procedure.  We require a 3 day anticoagulation hold for all spinal procedures.

## 2021-08-23 ENCOUNTER — Ambulatory Visit: Payer: Self-pay

## 2021-08-23 ENCOUNTER — Ambulatory Visit (INDEPENDENT_AMBULATORY_CARE_PROVIDER_SITE_OTHER): Payer: Medicare Other | Admitting: Physical Medicine and Rehabilitation

## 2021-08-23 ENCOUNTER — Encounter: Payer: Self-pay | Admitting: Physical Medicine and Rehabilitation

## 2021-08-23 VITALS — BP 111/71 | HR 86

## 2021-08-23 DIAGNOSIS — M5412 Radiculopathy, cervical region: Secondary | ICD-10-CM | POA: Diagnosis not present

## 2021-08-23 MED ORDER — METHYLPREDNISOLONE ACETATE 80 MG/ML IJ SUSP
80.0000 mg | Freq: Once | INTRAMUSCULAR | Status: AC
  Administered 2021-08-23: 80 mg

## 2021-08-23 NOTE — Progress Notes (Signed)
Pt state neck pain that travels down her left shoulder, arm and hand. Pt state turning her head and lifting her arm makes the pain worse. Pt state she takes pian meds and uses heat to help ease her pain.  Numeric Pain Rating Scale and Functional Assessment Average Pain 5   In the last MONTH (on 0-10 scale) has pain interfered with the following?  1. General activity like being  able to carry out your everyday physical activities such as walking, climbing stairs, carrying groceries, or moving a chair?  Rating(10)   +Driver, +BT pt been off for four days, -Dye Allergies.

## 2021-08-23 NOTE — Patient Instructions (Signed)

## 2021-08-25 ENCOUNTER — Other Ambulatory Visit: Payer: Self-pay | Admitting: Cardiovascular Disease

## 2021-09-05 NOTE — Procedures (Signed)
Cervical Epidural Steroid Injection - Interlaminar Approach with Fluoroscopic Guidance  Patient: [name]      Date of Birth: 20-Jan-1934 MRN: [id number] PCP: Elisabeth Cara, PA-C      Visit Date: 08/23/2021   Universal Protocol:    Date/Time: 07/31/236:30 AM  Consent Given By: the patient  Position: PRONE  Additional Comments: Vital signs were monitored before and after the procedure. Patient was prepped and draped in the usual sterile fashion. The correct patient, procedure, and site was verified.   Injection Procedure Details:   Procedure diagnoses: Cervical radiculopathy [M54.12]    Meds Administered:  Meds ordered this encounter  Medications   methylPREDNISolone acetate (DEPO-MEDROL) injection 80 mg     Laterality: Left  Location/Site: T1-T2  Needle: 3.5 in., 20 ga. Tuohy  Needle Placement: Paramedian epidural space  Findings:  -Comments: Excellent flow of contrast into the epidural space.  Procedure Details: Using a paramedian approach from the side mentioned above, the region overlying the inferior lamina was localized under fluoroscopic visualization and the soft tissues overlying this structure were infiltrated with 4 ml. of 1% Lidocaine without Epinephrine. A # 20 gauge, Tuohy needle was inserted into the epidural space using a paramedian approach.  The epidural space was localized using loss of resistance along with contralateral oblique bi-planar fluoroscopic views.  After negative aspirate for air, blood, and CSF, a 2 ml. volume of Isovue-250 was injected into the epidural space and the flow of contrast was observed. Radiographs were obtained for documentation purposes.   The injectate was administered into the level noted above.  Additional Comments:  The patient tolerated the procedure well Dressing: 2 x 2 sterile gauze and Band-Aid    Post-procedure details: Patient was observed during the procedure. Post-procedure instructions  were reviewed.  Patient left the clinic in stable condition.

## 2021-09-05 NOTE — Progress Notes (Signed)
[name] - 88 y.o. female MRN [id number]  Date of birth: Nov 06, 1933  Office Visit Note: Visit Date: 08/23/2021 PCP: Elisabeth Cara, PA-C Referred by: Belva Bertin, [place], *  Subjective: Chief Complaint  Patient presents with   Neck - Pain   Left Shoulder - Pain   Left Hand - Pain   HPI:  [name] is a 88 y.o. female who comes in today at the request of Barnet Pall, FNP for planned Left T1-2 Cervical Interlaminar epidural steroid injection with fluoroscopic guidance.  The patient has failed conservative care including home exercise, medications, time and activity modification.  This injection will be diagnostic and hopefully therapeutic.  Please see requesting physician notes for further details and justification.   ROS Otherwise per HPI.  Assessment & Plan: Visit Diagnoses:    ICD-10-CM   1. Cervical radiculopathy  M54.12 XR C-ARM NO REPORT    Epidural Steroid injection    methylPREDNISolone acetate (DEPO-MEDROL) injection 80 mg      Plan: No additional findings.   Meds & Orders:  Meds ordered this encounter  Medications   methylPREDNISolone acetate (DEPO-MEDROL) injection 80 mg    Orders Placed This Encounter  Procedures   XR C-ARM NO REPORT   Epidural Steroid injection    Follow-up: Return for visit to requesting provider as needed.   Procedures: No procedures performed  Cervical Epidural Steroid Injection - Interlaminar Approach with Fluoroscopic Guidance  Patient: [name]      Date of Birth: 11/20/1933 MRN: [id number] PCP: Elisabeth Cara, PA-C      Visit Date: 08/23/2021   Universal Protocol:    Date/Time: 07/31/236:30 AM  Consent Given By: the patient  Position: PRONE  Additional Comments: Vital signs were monitored before and after the procedure. Patient was prepped and draped in the usual sterile fashion. The correct patient, procedure, and site was verified.   Injection Procedure Details:    Procedure diagnoses: Cervical radiculopathy [M54.12]    Meds Administered:  Meds ordered this encounter  Medications   methylPREDNISolone acetate (DEPO-MEDROL) injection 80 mg     Laterality: Left  Location/Site: T1-T2  Needle: 3.5 in., 20 ga. Tuohy  Needle Placement: Paramedian epidural space  Findings:  -Comments: Excellent flow of contrast into the epidural space.  Procedure Details: Using a paramedian approach from the side mentioned above, the region overlying the inferior lamina was localized under fluoroscopic visualization and the soft tissues overlying this structure were infiltrated with 4 ml. of 1% Lidocaine without Epinephrine. A # 20 gauge, Tuohy needle was inserted into the epidural space using a paramedian approach.  The epidural space was localized using loss of resistance along with contralateral oblique bi-planar fluoroscopic views.  After negative aspirate for air, blood, and CSF, a 2 ml. volume of Isovue-250 was injected into the epidural space and the flow of contrast was observed. Radiographs were obtained for documentation purposes.   The injectate was administered into the level noted above.  Additional Comments:  The patient tolerated the procedure well Dressing: 2 x 2 sterile gauze and Band-Aid    Post-procedure details: Patient was observed during the procedure. Post-procedure instructions were reviewed.  Patient left the clinic in stable condition.   Clinical History: EXAM: MRI CERVICAL SPINE WITHOUT CONTRAST   TECHNIQUE: Multiplanar, multisequence MR imaging of the cervical spine was performed. No intravenous contrast was administered.   COMPARISON:  None Available.   FINDINGS: Alignment: Grade 1 anterolisthesis at C5-6   Vertebrae: No fracture,  evidence of discitis, or bone lesion.   Cord: Normal signal and morphology.   Posterior Fossa, vertebral arteries, paraspinal tissues: Negative.   Disc levels:   C1-2: Unremarkable.    C2-3: Small disc bulge with bilateral uncovertebral hypertrophy. No spinal canal stenosis. Moderate bilateral neural foraminal stenosis.   C3-4: Medium-sized disc bulge with large uncovertebral osteophytes. Severe spinal canal stenosis. Severe bilateral neural foraminal stenosis.   C4-5: Small disc bulge with uncovertebral hypertrophy. Moderate spinal canal stenosis. Moderate right and mild left neural foraminal stenosis.   C5-6: Small disc bulge with moderate bilateral uncovertebral hypertrophy. There is no spinal canal stenosis. Moderate bilateral neural foraminal stenosis.   C6-7: Small disc bulge. There is no spinal canal stenosis. Mild left neural foraminal stenosis.   C7-T1: Medium-sized disc bulge with uncovertebral hypertrophy. Mild spinal canal stenosis. Severe bilateral neural foraminal stenosis.   IMPRESSION: 1. Multilevel cervical degenerative disc disease with severe spinal canal stenosis at C3-4 and moderate spinal canal stenosis at C4-5. 2. Multilevel moderate to severe neural foraminal stenosis, worst at C3-4 and C7-T1.     Electronically Signed   By: Ulyses Jarred M.D.   On: 06/22/2021 00:11     Objective:  VS:  HT:    WT:   BMI:     BP:111/71  HR:86bpm  TEMP: ( )  RESP:  Physical Exam Vitals and nursing note reviewed.  Constitutional:      General: She is not in acute distress.    Appearance: Normal appearance. She is not ill-appearing.  HENT:     Head: Normocephalic and atraumatic.     Right Ear: External ear normal.     Left Ear: External ear normal.  Eyes:     Extraocular Movements: Extraocular movements intact.  Cardiovascular:     Rate and Rhythm: Normal rate.     Pulses: Normal pulses.  Musculoskeletal:     Cervical back: Tenderness present. No rigidity.     Right lower leg: No edema.     Left lower leg: No edema.     Comments: Patient has good strength in the upper extremities including 5 out of 5 strength in wrist extension long  finger flexion and APB.  There is no atrophy of the hands intrinsically.  There is a negative Hoffmann's test.   Lymphadenopathy:     Cervical: No cervical adenopathy.  Skin:    Findings: No erythema, lesion or rash.  Neurological:     General: No focal deficit present.     Mental Status: She is alert and oriented to person, place, and time.     Sensory: No sensory deficit.     Motor: No weakness or abnormal muscle tone.     Coordination: Coordination normal.  Psychiatric:        Mood and Affect: Mood normal.        Behavior: Behavior normal.      Imaging: No results found.

## 2021-09-22 ENCOUNTER — Other Ambulatory Visit (HOSPITAL_BASED_OUTPATIENT_CLINIC_OR_DEPARTMENT_OTHER): Payer: Self-pay | Admitting: Family

## 2021-09-22 DIAGNOSIS — I10 Essential (primary) hypertension: Secondary | ICD-10-CM

## 2021-09-22 DIAGNOSIS — I2510 Atherosclerotic heart disease of native coronary artery without angina pectoris: Secondary | ICD-10-CM

## 2021-09-22 NOTE — Telephone Encounter (Signed)
Rx(s) sent to pharmacy electronically.  

## 2021-10-12 ENCOUNTER — Emergency Department (HOSPITAL_BASED_OUTPATIENT_CLINIC_OR_DEPARTMENT_OTHER): Payer: Medicare Other

## 2021-10-12 ENCOUNTER — Emergency Department (HOSPITAL_BASED_OUTPATIENT_CLINIC_OR_DEPARTMENT_OTHER)
Admission: EM | Admit: 2021-10-12 | Discharge: 2021-10-12 | Disposition: A | Discharge status: Home / Self Care | Payer: Medicare Other | Attending: Emergency Medicine | Admitting: Emergency Medicine

## 2021-10-12 ENCOUNTER — Encounter (HOSPITAL_BASED_OUTPATIENT_CLINIC_OR_DEPARTMENT_OTHER): Payer: Self-pay

## 2021-10-12 DIAGNOSIS — I4891 Unspecified atrial fibrillation: Secondary | ICD-10-CM | POA: Diagnosis not present

## 2021-10-12 DIAGNOSIS — I10 Essential (primary) hypertension: Secondary | ICD-10-CM | POA: Insufficient documentation

## 2021-10-12 DIAGNOSIS — R1084 Generalized abdominal pain: Secondary | ICD-10-CM

## 2021-10-12 DIAGNOSIS — K649 Unspecified hemorrhoids: Secondary | ICD-10-CM

## 2021-10-12 DIAGNOSIS — K644 Residual hemorrhoidal skin tags: Secondary | ICD-10-CM | POA: Diagnosis not present

## 2021-10-12 DIAGNOSIS — Z79899 Other long term (current) drug therapy: Secondary | ICD-10-CM | POA: Diagnosis not present

## 2021-10-12 DIAGNOSIS — R0789 Other chest pain: Secondary | ICD-10-CM | POA: Insufficient documentation

## 2021-10-12 LAB — COMPREHENSIVE METABOLIC PANEL
ALT: 10 U/L (ref 0–44)
AST: 24 U/L (ref 15–41)
Albumin: 4 g/dL (ref 3.5–5.0)
Alkaline Phosphatase: 73 U/L (ref 38–126)
Anion gap: 8 (ref 5–15)
BUN: 13 mg/dL (ref 8–23)
CO2: 28 mmol/L (ref 22–32)
Calcium: 9 mg/dL (ref 8.9–10.3)
Chloride: 102 mmol/L (ref 98–111)
Creatinine, Ser: 1 mg/dL (ref 0.44–1.00)
GFR, Estimated: 55 mL/min — ABNORMAL LOW (ref >60)
Glucose, Bld: 120 mg/dL — ABNORMAL HIGH (ref 70–99)
Potassium: 3.5 mmol/L (ref 3.5–5.1)
Sodium: 138 mmol/L (ref 135–145)
Total Bilirubin: 0.7 mg/dL (ref 0.3–1.2)
Total Protein: 7.8 g/dL (ref 6.5–8.1)

## 2021-10-12 LAB — CBC
HCT: 30.5 % — ABNORMAL LOW (ref 36.0–46.0)
Hemoglobin: 9.4 g/dL — ABNORMAL LOW (ref 12.0–15.0)
MCH: 25.8 pg — ABNORMAL LOW (ref 26.0–34.0)
MCHC: 30.8 g/dL (ref 30.0–36.0)
MCV: 83.8 fL (ref 80.0–100.0)
Platelets: 316 10*3/uL (ref 150–400)
RBC: 3.64 MIL/uL — ABNORMAL LOW (ref 3.87–5.11)
RDW: 15.5 % (ref 11.5–15.5)
WBC: 7.1 10*3/uL (ref 4.0–10.5)
nRBC: 0 % (ref 0.0–0.2)

## 2021-10-12 LAB — URINALYSIS, MICROSCOPIC (REFLEX)

## 2021-10-12 LAB — URINALYSIS, ROUTINE W REFLEX MICROSCOPIC
Bilirubin Urine: NEGATIVE
Glucose, UA: NEGATIVE mg/dL
Hgb urine dipstick: NEGATIVE
Ketones, ur: NEGATIVE mg/dL
Nitrite: NEGATIVE
Protein, ur: NEGATIVE mg/dL
Specific Gravity, Urine: 1.01 (ref 1.005–1.030)
pH: 7.5 (ref 5.0–8.0)

## 2021-10-12 LAB — TROPONIN I (HIGH SENSITIVITY)
Troponin I (High Sensitivity): 10 ng/L (ref <18)
Troponin I (High Sensitivity): 8 ng/L (ref <18)

## 2021-10-12 LAB — MAGNESIUM: Magnesium: 1.8 mg/dL (ref 1.7–2.4)

## 2021-10-12 LAB — LIPASE, BLOOD: Lipase: 49 U/L (ref 11–51)

## 2021-10-12 MED ORDER — TRAMADOL HCL 50 MG PO TABS: 50.0000 mg | ORAL_TABLET | Freq: Two times a day (BID) | ORAL | 0 refills | Status: DC | PRN

## 2021-10-12 MED ORDER — ONDANSETRON HCL 4 MG/2ML IJ SOLN
4.0000 mg | Freq: Once | INTRAMUSCULAR | Status: AC
  Administered 2021-10-12: 4 mg via INTRAVENOUS
  Filled 2021-10-12: qty 2

## 2021-10-12 MED ORDER — HYDROCORTISONE (PERIANAL) 2.5 % EX CREA: 1.0000 | TOPICAL_CREAM | Freq: Two times a day (BID) | CUTANEOUS | 0 refills | Status: AC

## 2021-10-12 MED ORDER — CELECOXIB 200 MG PO CAPS: 200.0000 mg | ORAL_CAPSULE | Freq: Two times a day (BID) | ORAL | 0 refills | Status: DC

## 2021-10-12 MED ORDER — IOHEXOL 300 MG/ML  SOLN
100.0000 mL | Freq: Once | INTRAMUSCULAR | Status: AC | PRN
  Administered 2021-10-12: 100 mL via INTRAVENOUS

## 2021-10-12 MED ORDER — SODIUM CHLORIDE 0.9 % IV BOLUS
1000.0000 mL | Freq: Once | INTRAVENOUS | Status: AC
  Administered 2021-10-12: 1000 mL via INTRAVENOUS

## 2021-10-12 MED ORDER — MORPHINE SULFATE (PF) 4 MG/ML IV SOLN
4.0000 mg | Freq: Once | INTRAVENOUS | Status: AC
  Administered 2021-10-12: 4 mg via INTRAVENOUS
  Filled 2021-10-12: qty 1

## 2021-10-12 NOTE — ED Notes (Signed)
Pt d/c home per MD order, Discharge summary reviewed, pt verbalizes understanding. Ambulatory off unit . No s/s of acute distress noted. Reports grandson is discharge ride home

## 2021-10-12 NOTE — ED Triage Notes (Signed)
States her left side hurts from her axilla to her hip intermittently x 2 weeks. C/o generalized body pain & rectal itching. Denies N/V/D.

## 2021-10-12 NOTE — ED Provider Notes (Signed)
[facility] EMERGENCY DEPARTMENT Provider Note   CSN: [id number] Arrival date & time: 10/12/21  0747     History  Chief Complaint  Patient presents with   Abdominal Pain    [name] is a 88 y.o. female.  Pt is a 88 yo female with a pmhx significant for htn, afib (on Xarelto), gerd, hld, osa on cpap, dm2, brca, chf and cad.  Pt said she has left side pain from her left chest down to her abd.  She has been having this pain for a few weeks.  She denies any other associated sx.        Home Medications Prior to Admission medications   Medication Sig Start Date End Date Taking? Authorizing Provider  celecoxib (CELEBREX) 200 MG capsule Take 1 capsule (200 mg total) by mouth 2 (two) times daily. 10/12/21  Yes Isla Pence, MD  hydrocortisone (ANUSOL-HC) 2.5 % rectal cream Place 1 Application rectally 2 (two) times daily. 10/12/21  Yes Isla Pence, MD  albuterol (VENTOLIN HFA) 108 (90 Base) MCG/ACT inhaler Inhale 2 puffs into the lungs every 4 (four) hours as needed for wheezing or shortness of breath.    [provider]  amLODipine (NORVASC) 5 MG tablet Take 1 tablet (5 mg total) by mouth daily. 06/29/21   Sherren Mocha, MD  atorvastatin (LIPITOR) 40 MG tablet TAKE 1 TABLET BY MOUTH EVERY DAY 08/25/21   Sherren Mocha, MD  Cholecalciferol (VITAMIN D) 2000 units tablet Take 2,000 Units by mouth daily.    [provider]  FLOVENT HFA 110 MCG/ACT inhaler TAKE 2 PUFFS BY MOUTH TWICE A DAY 03/21/17   Bobbitt, [place] Muta, MD  fluticasone [facility]) 50 MCG/ACT nasal spray Place 1 spray into both nostrils as needed. 03/14/21   [provider]  furosemide (LASIX) 20 MG tablet TAKE 1 TABLET BY MOUTH EVERY DAY 05/04/21   Elgie Collard, PA-C  gabapentin (NEURONTIN) 100 MG capsule Take 100-200 mg by mouth at bedtime. 06/22/20   [provider]  HYDROcodone-acetaminophen (NORCO/VICODIN) 5-325 MG tablet Take 1 tablet by mouth every 8 (eight) hours  as needed for moderate pain or severe pain. 07/21/21   Lorine Bears, NP  ketorolac (ACULAR) 0.5 % ophthalmic solution Place into both eyes daily. 11/02/20   [provider]  liver oil-zinc oxide (DESITIN) 40 % ointment Apply topically as needed for irritation. 09/13/20   Geradine Girt, DO  metoprolol tartrate (LOPRESSOR) 25 MG tablet TAKE 1 TABLET BY MOUTH TWICE A DAY 09/22/21   Sherren Mocha, MD  omeprazole (PRILOSEC) 40 MG capsule Take 1 capsule (40 mg total) by mouth daily. 09/23/20   Loel Dubonnet, NP  predniSONE (STERAPRED UNI-PAK 21 TAB) 5 MG (21) TBPK tablet Take as directed 04/26/21   Aundra Dubin, PA-C  Rivaroxaban (XARELTO) 15 MG TABS tablet Take 1 tablet (15 mg total) by mouth daily with supper. 07/09/20   Cheryln Manly, NP  traMADol (ULTRAM) 50 MG tablet Take 1 tablet (50 mg total) by mouth every 12 (twelve) hours as needed. 10/12/21   Isla Pence, MD      Allergies    Ferumoxytol, Chocolate, Lisinopril, and Peanuts [peanut oil]    Review of Systems   Review of Systems  Cardiovascular:  Positive for chest pain.  Gastrointestinal:  Positive for abdominal pain.  All other systems reviewed and are negative.   Physical Exam Updated Vital Signs BP 134/66   Pulse 70   Temp 98.7 F (37.1  C) (Oral)   Resp 18   Ht 5' 4"  (1.626 m)   Wt 65.3 kg   SpO2 99%   BMI 24.72 kg/m  Physical Exam Vitals and nursing note reviewed. Exam conducted with a chaperone present.  Constitutional:      Appearance: She is well-developed.  HENT:     Head: Normocephalic and atraumatic.     Mouth/Throat:     Mouth: Mucous membranes are moist.     Pharynx: Oropharynx is clear.  Eyes:     Extraocular Movements: Extraocular movements intact.     Pupils: Pupils are equal, round, and reactive to light.  Cardiovascular:     Rate and Rhythm: Normal rate. Rhythm irregular.     Heart sounds: Normal heart sounds.  Pulmonary:     Effort: Pulmonary effort is normal.     Breath  sounds: Normal breath sounds.  Chest:     Comments: Left mastectomy; no rashes Abdominal:     General: Abdomen is flat. Bowel sounds are normal.     Palpations: Abdomen is soft.     Tenderness: There is generalized abdominal tenderness.  Genitourinary:    Rectum: External hemorrhoid present.  Skin:    General: Skin is warm.     Capillary Refill: Capillary refill takes less than 2 seconds.  Neurological:     General: No focal deficit present.     Mental Status: She is alert and oriented to person, place, and time.  Psychiatric:        Mood and Affect: Mood normal.        Behavior: Behavior normal.     ED Results / Procedures / Treatments   Labs (all labs ordered are listed, but only abnormal results are displayed) Labs Reviewed  COMPREHENSIVE METABOLIC PANEL - Abnormal; Notable for the following components:      Result Value   Glucose, Bld 120 (*)    GFR, Estimated 55 (*)    All other components within normal limits  CBC - Abnormal; Notable for the following components:   RBC 3.64 (*)    Hemoglobin 9.4 (*)    HCT 30.5 (*)    MCH 25.8 (*)    All other components within normal limits  URINALYSIS, ROUTINE W REFLEX MICROSCOPIC - Abnormal; Notable for the following components:   Leukocytes,Ua TRACE (*)    All other components within normal limits  URINALYSIS, MICROSCOPIC (REFLEX) - Abnormal; Notable for the following components:   Bacteria, UA RARE (*)    All other components within normal limits  LIPASE, BLOOD  MAGNESIUM  TROPONIN I (HIGH SENSITIVITY)  TROPONIN I (HIGH SENSITIVITY)    EKG EKG Interpretation  Date/Time:  Wednesday October 12 2021 08:22:01 EDT Ventricular Rate:  82 PR Interval:    QRS Duration: 92 QT Interval:  444 QTC Calculation: 519 R Axis:   120 Text Interpretation: Atrial fibrillation Right axis deviation Anteroseptal infarct, old Borderline T abnormalities, inferior leads Prolonged QT interval slt  qt is more prolonged Confirmed by  Isla Pence [phone]) on 10/12/2021 8:24:02 AM  Radiology CT CHEST ABDOMEN PELVIS W CONTRAST  Result Date: 10/12/2021 CLINICAL DATA:  88 year old female with sepsis and left-sided pain. EXAM: CT CHEST, ABDOMEN, AND PELVIS WITH CONTRAST TECHNIQUE: Multidetector CT imaging of the chest, abdomen and pelvis was performed following the standard protocol during bolus administration of intravenous contrast. RADIATION DOSE REDUCTION: This exam was performed according to the departmental dose-optimization program which includes automated exposure control, adjustment of the mA and/or kV according  to patient size and/or use of iterative reconstruction technique. CONTRAST:  189m OMNIPAQUE IOHEXOL 300 MG/ML  SOLN COMPARISON:  07/03/2020 FINDINGS: CT CHEST FINDINGS Cardiovascular: Similar appearing severe biatrial cardiomegaly. Atherosclerotic calcification of the aortic arch. Severe coronary atherosclerotic calcifications. Mediastinum/Nodes: Left axillary surgical clips again seen. No mediastinal hilar lymphadenopathy. The visualized trachea, thyroid gland, and esophagus are within normal limits. Lungs/Pleura: No focal consolidation, pleural effusion, or pneumothorax. Similar appearing diffuse, lower lobe predominant subpleural mild reticular opacities. Mild diffuse mosaic attenuation pattern, likely secondary to expiratory phase image acquisition. No suspicious pulmonary nodules. Musculoskeletal: No acute osseous abnormality. Mild multilevel degenerative changes of the thoracic spine. CT ABDOMEN PELVIS FINDINGS Hepatobiliary: No focal liver abnormality is seen. Status post cholecystectomy. No biliary dilatation. Pancreas: Unremarkable. No pancreatic ductal dilatation or surrounding inflammatory changes. Spleen: Normal in size without focal abnormality. Adrenals/Urinary Tract: Adrenal glands are unremarkable. Similar appearing bilateral exophytic simple renal cysts measuring up to 4.9 cm on the right and 6.0 cm on the left  kidneys are otherwise normal, without renal calculi, focal lesion, or hydronephrosis. Bladder is unremarkable. Stomach/Bowel: Stomach is within normal limits. Appendix appears normal. Similar appearing descending and sigmoid colonic diverticula without surrounding inflammatory changes. No evidence of bowel wall thickening, distention, or inflammatory changes. Vascular/Lymphatic: Aortic atherosclerosis. No enlarged abdominal or pelvic lymph nodes. Reproductive: Uterus and bilateral adnexa are unremarkable. Other: No abdominal wall hernia or abnormality. No abdominopelvic ascites. Musculoskeletal: No acute osseous abnormality. Mild multilevel degenerative changes of the lumbar spine. IMPRESSION: 1. No acute abnormality in the chest, abdomen, or pelvis to explain left-sided pain or sepsis. 2. Unchanged severe biatrial cardiomegaly. 3. Coronary and aortic atherosclerosis (ICD10-I70.0). 4. Diverticulosis without evidence of diverticulitis. 5. Similar appearing single, exophytic bilateral simple renal cysts, measuring up to 6 cm on the left. Electronically Signed   By: DRuthann CancerM.D.   On: 10/12/2021 09:35    Procedures Procedures    Medications Ordered in ED Medications  sodium chloride 0.9 % bolus 1,000 mL (1,000 mLs Intravenous New Bag/Given 10/12/21 0825)  morphine (PF) 4 MG/ML injection 4 mg (4 mg Intravenous Given 10/12/21 0825)  ondansetron (ZOFRAN) injection 4 mg (4 mg Intravenous Given 10/12/21 0826)  iohexol (OMNIPAQUE) 300 MG/ML solution 100 mL (100 mLs Intravenous Contrast Given 10/12/21 0907)    ED Course/ Medical Decision Making/ A&P                           Medical Decision Making Amount and/or Complexity of Data Reviewed Labs: ordered. Radiology: ordered.  Risk Prescription drug management.   This patient presents to the ED for concern of abd and cp, this involves an extensive number of treatment options, and is a complaint that carries with it a high risk of complications and  morbidity.  The differential diagnosis includes uti, kidney infection, pna, infection   Co morbidities that complicate the patient evaluation  htn, afib (on Xarelto), gerd, hld, osa on cpap, dm2, brca, chf and cad   Additional history obtained:  Additional history obtained from epic chart review   Lab Tests:  I Ordered, and personally interpreted labs.  The pertinent results include:  cbc with hgb 9.4 (chronic); cmp nl, mg nl, lip nl   Imaging Studies ordered:  I ordered imaging studies including ct chest/abd/pelvis  I independently visualized and interpreted imaging which showed  IMPRESSION:  1. No acute abnormality in the chest, abdomen, or pelvis to explain  left-sided pain or sepsis.  2.  Unchanged severe biatrial cardiomegaly.  3. Coronary and aortic atherosclerosis (ICD10-I70.0).  4. Diverticulosis without evidence of diverticulitis.  5. Similar appearing single, exophytic bilateral simple renal cysts,  measuring up to 6 cm on the left.   I agree with the radiologist interpretation   Cardiac Monitoring:  The patient was maintained on a cardiac monitor.  I personally viewed and interpreted the cardiac monitored which showed an underlying rhythm of: nsr   Medicines ordered and prescription drug management:  I ordered medication including morphine and zofran  for pain and nausea  Reevaluation of the patient after these medicines showed that the patient improved I have reviewed the patients home medicines and have made adjustments as needed   Test Considered:  ct   Critical Interventions:  Pain control   Problem List / ED Course:   Abd Pain:  no etiology for sx found.  Pt is to f/u with pcp and with GI.  Pt is now saying her pain is all over.  She said she aches and has pain all the time.  She had a rx for lortab in July.  I am going to d/c her with some tramadol and celebrex.  Hemorrhoids:  pt given a rx for anusol.  She is encouraged to eat a high fiber  diet.   Reevaluation:  After the interventions noted above, I reevaluated the patient and found that they have :improved   Social Determinants of Health:  Lives alone   Dispostion:  After consideration of the diagnostic results and the patients response to treatment, I feel that the patent would benefit from discharge with outpatient f/u.          Final Clinical Impression(s) / ED Diagnoses Final diagnoses:  Generalized abdominal pain  Atypical chest pain  Hemorrhoids, unspecified hemorrhoid type    Rx / DC Orders ED Discharge Orders          Ordered    hydrocortisone (ANUSOL-HC) 2.5 % rectal cream  2 times daily        10/12/21 1107    traMADol (ULTRAM) 50 MG tablet  Every 12 hours PRN        10/12/21 1107    celecoxib (CELEBREX) 200 MG capsule  2 times daily        10/12/21 1107              Isla Pence, MD 10/12/21 1110

## 2021-11-03 ENCOUNTER — Telehealth: Payer: Self-pay | Admitting: Physical Medicine and Rehabilitation

## 2021-11-03 DIAGNOSIS — M5412 Radiculopathy, cervical region: Secondary | ICD-10-CM

## 2021-11-03 NOTE — Telephone Encounter (Signed)
Pt calling to repeat Left T1-T1 interlaminar epidural steroid injection. Last injection was on 08/23/2021. Worked up until now. Pain just started coming back. No new pain

## 2021-11-04 ENCOUNTER — Telehealth: Payer: Self-pay | Admitting: Orthopaedic Surgery

## 2021-11-04 ENCOUNTER — Telehealth: Payer: Self-pay | Admitting: *Deleted

## 2021-11-04 NOTE — Addendum Note (Signed)
Addended by: Meyer Cory on: 11/04/2021 11:08 AM   Modules accepted: Orders

## 2021-11-04 NOTE — Telephone Encounter (Signed)
Holding, just in case you need this.

## 2021-11-04 NOTE — Telephone Encounter (Signed)
Patient called. She would like Tramadol called in for her.

## 2021-11-04 NOTE — Telephone Encounter (Signed)
Referral entered.  Note made that patient will need clearance due to Xarelto. Holding for Lauren in case you need to do something in regards to that.

## 2021-11-04 NOTE — Telephone Encounter (Signed)
   Pre-operative Risk Assessment    Patient Name: SAVANNAH MORFORD  DOB: 07/26/1933 MRN: [id number]      Request for Surgical Clearance    Procedure:   STEROID INJECTION  Date of Surgery:  Clearance TBD                                 Surgeon:  DR. Brandt Loosen Group or Practice Name:  [facility] Phone number:  [phone] Fax number:  [phone]   Type of Clearance Requested:   - Pharmacy:  Hold Rivaroxaban (Xarelto) X'S 2 DAYS   Type of Anesthesia:  Not Indicated   Additional requests/questions:    Astrid Divine   11/04/2021, 2:23 PM

## 2021-11-06 ENCOUNTER — Other Ambulatory Visit: Payer: Self-pay | Admitting: Physician Assistant

## 2021-11-06 MED ORDER — TRAMADOL HCL 50 MG PO TABS: 50.0000 mg | ORAL_TABLET | Freq: Two times a day (BID) | ORAL | 2 refills | Status: DC | PRN

## 2021-11-06 NOTE — Telephone Encounter (Signed)
sent 

## 2021-11-09 NOTE — Telephone Encounter (Signed)
   Primary Cardiologist: Sherren Mocha, MD  Chart reviewed as part of pre-operative protocol coverage. Given past medical history and time since last visit, based on ACC/AHA guidelines, [name] would be at acceptable risk for the planned procedure without further cardiovascular testing.   Per office protocol, patient should hold Xarelto  for 3 days prior to spinal procedure.    I will route this recommendation to the requesting party via Epic fax function and remove from pre-op pool.  Please call with questions.  Emmaline Life, NP-C  11/09/2021, 12:19 PM [address] Office [phone] Fax [phone]

## 2021-11-09 NOTE — Telephone Encounter (Signed)
Patient with diagnosis of A Fib on Xarelto for anticoagulation.    Procedure: left T1-T2 interlaminar epidural steroid injection Date of procedure: TBD    CHA2DS2-VASc Score = 6  This indicates a 9.7% annual risk of stroke. The patient's score is based upon: CHF History: 1 HTN History: 1 Diabetes History: 1 Stroke History: 0 Vascular Disease History: 0 Age Score: 2 Gender Score: 1   CrCl 42 ml/min Platelet count 348K  Per office protocol, patient can hold Xarelto  for 3 days prior to procedure.

## 2021-11-10 NOTE — Telephone Encounter (Signed)
CLEARANCE NOTES RE-FAXED WITH THE RECOMMENDATIONS TO HOLD XARELTO x 3 DAYS WHEN USING SPINAL ANESTHESIA

## 2021-11-24 ENCOUNTER — Ambulatory Visit (INDEPENDENT_AMBULATORY_CARE_PROVIDER_SITE_OTHER): Payer: Medicare Other | Admitting: Physical Medicine and Rehabilitation

## 2021-11-24 ENCOUNTER — Ambulatory Visit: Payer: Self-pay

## 2021-11-24 VITALS — BP 142/75 | HR 86

## 2021-11-24 DIAGNOSIS — M5412 Radiculopathy, cervical region: Secondary | ICD-10-CM

## 2021-11-24 MED ORDER — METHYLPREDNISOLONE ACETATE 80 MG/ML IJ SUSP
40.0000 mg | Freq: Once | INTRAMUSCULAR | Status: AC
  Administered 2021-11-24: 40 mg

## 2021-11-24 NOTE — Progress Notes (Signed)
Numeric Pain Rating Scale and Functional Assessment Average Pain 0   In the last MONTH (on 0-10 scale) has pain interfered with the following?  1. General activity like being  able to carry out your everyday physical activities such as walking, climbing stairs, carrying groceries, or moving a chair?  Rating(8)   +Driver, -BT- Xarelto (stopped 11/20/21), -Dye Allergies.  Stiffness in neck, Movements of head makes pain worse. Worse at night. Takes Tramadol for pain

## 2021-11-24 NOTE — Patient Instructions (Signed)

## 2021-12-04 NOTE — Procedures (Signed)
Cervical Epidural Steroid Injection - Interlaminar Approach with Fluoroscopic Guidance  Patient: [name]      Date of Birth: 15-Apr-1933 MRN: [id number] PCP: Elisabeth Cara, PA-C      Visit Date: 11/24/2021   Universal Protocol:    Date/Time: 10/29/232:32 PM  Consent Given By: the patient  Position: PRONE  Additional Comments: Vital signs were monitored before and after the procedure. Patient was prepped and draped in the usual sterile fashion. The correct patient, procedure, and site was verified.   Injection Procedure Details:   Procedure diagnoses: Cervical radiculopathy [M54.12]    Meds Administered:  Meds ordered this encounter  Medications   methylPREDNISolone acetate (DEPO-MEDROL) injection 40 mg     Laterality: Left  Location/Site: T1-2  Needle: 3.5 in., 20 ga. Tuohy  Needle Placement: Paramedian epidural space  Findings:  -Comments: Excellent flow of contrast into the epidural space.  Once loss-of-resistance was achieved and excellent flow of contrast was achieved we inadvertently remove the epidural needle dorsally away from the epidural space.  With slight repositioning I did achieve loss of resistance once again and a second epidurogram showing excellent flow.  Procedure Details: Using a paramedian approach from the side mentioned above, the region overlying the inferior lamina was localized under fluoroscopic visualization and the soft tissues overlying this structure were infiltrated with 4 ml. of 1% Lidocaine without Epinephrine. A # 20 gauge, Tuohy needle was inserted into the epidural space using a paramedian approach.  The epidural space was localized using loss of resistance along with contralateral oblique bi-planar fluoroscopic views.  After negative aspirate for air, blood, and CSF, a 2 ml. volume of Isovue-250 was injected into the epidural space and the flow of contrast was observed. Radiographs were obtained for documentation  purposes.   The injectate was administered into the level noted above.  Additional Comments:  The patient tolerated the procedure well Dressing: 2 x 2 sterile gauze and Band-Aid    Post-procedure details: Patient was observed during the procedure. Post-procedure instructions were reviewed.  Patient left the clinic in stable condition.

## 2021-12-04 NOTE — Progress Notes (Signed)
ANGLE DIRUSSO - 88 y.o. female MRN [id number]  Date of birth: 10/27/1933  Office Visit Note: Visit Date: 11/24/2021 PCP: Elisabeth Cara, PA-C Referred by: Belva Bertin, [place], *  Subjective: Chief Complaint  Patient presents with   Middle Back - Pain   HPI:  MERCEDEZ BOULE is a 88 y.o. female who comes in today for planned repeat Left T1-2  Cervical Interlaminar epidural steroid injection with fluoroscopic guidance.  The patient has failed conservative care including home exercise, medications, time and activity modification.  This injection will be diagnostic and hopefully therapeutic.  Please see requesting physician notes for further details and justification. Patient received more than 50% pain relief from prior injection.   Referring: Dr. Eduard Roux   ROS Otherwise per HPI.  Assessment & Plan: Visit Diagnoses:    ICD-10-CM   1. Cervical radiculopathy  M54.12 XR C-ARM NO REPORT    Epidural Steroid injection    methylPREDNISolone acetate (DEPO-MEDROL) injection 40 mg      Plan: No additional findings.   Meds & Orders:  Meds ordered this encounter  Medications   methylPREDNISolone acetate (DEPO-MEDROL) injection 40 mg    Orders Placed This Encounter  Procedures   XR C-ARM NO REPORT   Epidural Steroid injection    Follow-up: Return for visit to requesting provider as needed.   Procedures: No procedures performed  Cervical Epidural Steroid Injection - Interlaminar Approach with Fluoroscopic Guidance  Patient: LAKEYTA VANDENHEUVEL      Date of Birth: 1933/11/15 MRN: [id number] PCP: Elisabeth Cara, PA-C      Visit Date: 11/24/2021   Universal Protocol:    Date/Time: 10/29/232:32 PM  Consent Given By: the patient  Position: PRONE  Additional Comments: Vital signs were monitored before and after the procedure. Patient was prepped and draped in the usual sterile fashion. The correct patient, procedure, and site was  verified.   Injection Procedure Details:   Procedure diagnoses: Cervical radiculopathy [M54.12]    Meds Administered:  Meds ordered this encounter  Medications   methylPREDNISolone acetate (DEPO-MEDROL) injection 40 mg     Laterality: Left  Location/Site: T1-2  Needle: 3.5 in., 20 ga. Tuohy  Needle Placement: Paramedian epidural space  Findings:  -Comments: Excellent flow of contrast into the epidural space.  Once loss-of-resistance was achieved and excellent flow of contrast was achieved we inadvertently remove the epidural needle dorsally away from the epidural space.  With slight repositioning I did achieve loss of resistance once again and a second epidurogram showing excellent flow.  Procedure Details: Using a paramedian approach from the side mentioned above, the region overlying the inferior lamina was localized under fluoroscopic visualization and the soft tissues overlying this structure were infiltrated with 4 ml. of 1% Lidocaine without Epinephrine. A # 20 gauge, Tuohy needle was inserted into the epidural space using a paramedian approach.  The epidural space was localized using loss of resistance along with contralateral oblique bi-planar fluoroscopic views.  After negative aspirate for air, blood, and CSF, a 2 ml. volume of Isovue-250 was injected into the epidural space and the flow of contrast was observed. Radiographs were obtained for documentation purposes.   The injectate was administered into the level noted above.  Additional Comments:  The patient tolerated the procedure well Dressing: 2 x 2 sterile gauze and Band-Aid    Post-procedure details: Patient was observed during the procedure. Post-procedure instructions were reviewed.  Patient left the clinic in stable condition.   Clinical History: EXAM:  MRI CERVICAL SPINE WITHOUT CONTRAST   TECHNIQUE: Multiplanar, multisequence MR imaging of the cervical spine was performed. No intravenous contrast  was administered.   COMPARISON:  None Available.   FINDINGS: Alignment: Grade 1 anterolisthesis at C5-6   Vertebrae: No fracture, evidence of discitis, or bone lesion.   Cord: Normal signal and morphology.   Posterior Fossa, vertebral arteries, paraspinal tissues: Negative.   Disc levels:   C1-2: Unremarkable.   C2-3: Small disc bulge with bilateral uncovertebral hypertrophy. No spinal canal stenosis. Moderate bilateral neural foraminal stenosis.   C3-4: Medium-sized disc bulge with large uncovertebral osteophytes. Severe spinal canal stenosis. Severe bilateral neural foraminal stenosis.   C4-5: Small disc bulge with uncovertebral hypertrophy. Moderate spinal canal stenosis. Moderate right and mild left neural foraminal stenosis.   C5-6: Small disc bulge with moderate bilateral uncovertebral hypertrophy. There is no spinal canal stenosis. Moderate bilateral neural foraminal stenosis.   C6-7: Small disc bulge. There is no spinal canal stenosis. Mild left neural foraminal stenosis.   C7-T1: Medium-sized disc bulge with uncovertebral hypertrophy. Mild spinal canal stenosis. Severe bilateral neural foraminal stenosis.   IMPRESSION: 1. Multilevel cervical degenerative disc disease with severe spinal canal stenosis at C3-4 and moderate spinal canal stenosis at C4-5. 2. Multilevel moderate to severe neural foraminal stenosis, worst at C3-4 and C7-T1.     Electronically Signed   By: Ulyses Jarred M.D.   On: 06/22/2021 00:11     Objective:  VS:  HT:    WT:   BMI:     BP:(!) 142/75  HR:86bpm  TEMP: ( )  RESP:  Physical Exam   Imaging: No results found.

## 2021-12-26 ENCOUNTER — Ambulatory Visit (INDEPENDENT_AMBULATORY_CARE_PROVIDER_SITE_OTHER): Payer: Medicare Other | Admitting: Podiatry

## 2021-12-26 ENCOUNTER — Encounter: Payer: Self-pay | Admitting: Podiatry

## 2021-12-26 DIAGNOSIS — B351 Tinea unguium: Secondary | ICD-10-CM

## 2021-12-26 DIAGNOSIS — M79675 Pain in left toe(s): Secondary | ICD-10-CM | POA: Diagnosis not present

## 2021-12-26 DIAGNOSIS — L600 Ingrowing nail: Secondary | ICD-10-CM

## 2021-12-26 NOTE — Patient Instructions (Signed)

## 2021-12-28 NOTE — Progress Notes (Signed)
Subjective:   Patient ID: [name], female   DOB: 88 y.o.   MRN: [id number]   HPI Patient presents with a thickened left hallux nail that sort to the bed and states its mostly on the dorsal surface and states that it is hard for her to cut and she has trouble with several nails on that foot.  Patient does not smoke is not active   Review of Systems  All other systems reviewed and are negative.       Objective:  Physical Exam Vitals and nursing note reviewed.  Constitutional:      Appearance: She is well-developed.  Pulmonary:     Effort: Pulmonary effort is normal.  Musculoskeletal:        General: Normal range of motion.  Skin:    General: Skin is warm.  Neurological:     Mental Status: She is alert.     Neurovascular status found to be intact muscle strength was found to be adequate range of motion was found to be adequate.  Patient does have a thickened deformed left hallux nail that is dystrophic it is painful there is no redness there is no drainage noted     Assessment:  Damage left hallux nail that is dystrophic and painful but also thick and mycotic     Plan:  Reviewed condition and recommended trying trimming technique first with consideration for nail removal if it continues to give her problems.  Patient wants this done and I went ahead today and using sterile instrumentation I debrided the nail I carefully took the corners of the nail out to reduce pressure and patient will be seen back if symptoms persist for surgical intervention

## 2022-02-06 NOTE — Progress Notes (Unsigned)
Office Visit    Patient Name: [name] Date of Encounter: 02/06/2022  PCP:  Willene Hatchet, NP   [facility]  Cardiologist:  Sherren Mocha, MD  Advanced Practice Provider:  No care team member to display Electrophysiologist:  None     Chief Complaint    [name] is a 89 y.o. female with a hx of hypertension, longstanding persistent atrial fibrillation, nonobstructive coronary artery disease, and CKD stage III presents today for a 52-monthfollow-up appointment.  Past Medical History    Past Medical History:  Diagnosis Date   Arthritis    "all over"   Atrial fibrillation ([facility]    Diagnosed ~2009   [place], [place] breast [place]   S/P chemo and mastectomy    CHF (congestive heart failure) (HCC)    GERD (gastroesophageal reflux disease)    Hyperlipidemia    Hypertension    Left shoulder pain    "MRI showed pinched nerve" - per pt   [place] [place]    OSA on CPAP    Pneumonia    "4-5 times" (01/06/2015)   Type II diabetes mellitus ([facility]    Past Surgical History:  Procedure Laterality Date   BREAST BIOPSY Left 1998   CATARACT EXTRACTION W/ INTRAOCULAR LENS  IMPLANT, BILATERAL Bilateral    JOINT REPLACEMENT     LAPAROSCOPIC CHOLECYSTECTOMY     LEFT HEART CATH AND CORONARY ANGIOGRAPHY N/A 07/07/2020   Procedure: LEFT HEART CATH AND CORONARY ANGIOGRAPHY;  Surgeon: VJettie Booze MD;  [facility];  Service: Cardiovascular;  Laterality: N/A;   MASTECTOMY Left 1998   TOTAL KNEE ARTHROPLASTY Left 2001   TOTAL KNEE REVISION Left 01/28/2013   Procedure: LEFT TOTAL KNEE ARTHROPLASTY REVISION;  Surgeon: NMarianna Payment MD;  [facility]  Service: Orthopedics;  Laterality: Left;    Allergies  Allergies  Allergen Reactions   Ferumoxytol Itching    Relieved with IV benadryl   Chocolate Other (See Comments)    Cough, sweating    Lisinopril Other (See Comments)     Cough, sweating   Peanuts [Peanut Oil] Cough    History of Present Illness    Cassandra JANYELY CUNNINGis a 89y.o. female with a hx of hx of hypertension, longstanding persistent atrial fibrillation, nonobstructive coronary artery disease, and CKD stage III presents today for a 328-monthollow-up appointment.  last seen by KaChristell FaithNP on 09/23/2020.  She was admitted 07/03/2020 with chest pain.  Her EKG showed subtle changes of possible inferior injury but did not meet STEMI criteria.  She underwent CT of the chest with no evidence of acute aortic syndrome nor dissection.  [facility] and received fentanyl patch which resolved her pain.  At bedtime troponin peaked at 3872.  She was treated with IV heparin and underwent cardiac cath showing mild nonobstructive coronary artery disease.  She was recommended for medical therapy.  Echo 07/04/2020 with LVEF 55-60%, indeterminate diastolic parameters, RV SF mildly reduced, mildly elevated PASP, severely enlarged right ventricle, LA moderately dilated, RA severely dilated, mild MR, mild dilation of ascending aorta 40 mm.  She was discharged on p.o. iron due to anemia.  She does not tolerate IV iron due to itching/allergic reaction.  She was discharged on atorvastatin 40 mg daily.  Sinus pauses less than 3 seconds which were asymptomatic were noted on the monitor  and her metoprolol was discontinued but due to subsequent elevated rates from metoprolol tartrate 25 mg twice daily was resumed.   ED visit 07/31/2020 for atypical chest pain.  Chest x-ray as read by radiologist showed atelectasis or infiltrate in right and mid lower lungs.  ED provider felt the changes were related to heart failure.  She was discharged with prescription for furosemide as well as increased dose of omeprazole.  Also noted slightly worse renal function than her baseline.   Seen in follow up 08/12/20 for follow up. Felt  improved with 5 days of Lasix but started noted dyspnea worsening since completing course. Lasix '20mg'$  daily was initiated.   ED visit 08/14/20 due to shortness of breath and 08/29/20 with hemorrhoids.    She was admitted 09/11/2020 - 09/13/2020 due to acute on chronic diastolic heart failure.  She was placed on Lasix 40 mg IV every 12 hours with urine output of 850 cc over 24 hours.  Her home Lasix was resumed on discharge.  Amlodipine was discontinued to allow for room and blood pressure for diuresis.  09/23/2020 she was seen in the clinic by Laurann Montana, NP.  Her weight has been decreasing since being at home from her hospitalization.  She was drinking plenty of water at that time.  She was maintaining a low-sodium diet and a fluid restriction, less than 2 L a day.  She reports that her dyspnea on exertion was improving. She was overall feeling better.  She was last seen in the clinic by myself 11/22 and she was feeling pretty good overall. No issue with edema. Weight has been stable. BP 119/84 this morning when she took it. It has been well controlled at home. She continues to do some walking around the house but has stopped walking outside since it is too cold now. She did some physical therapy at home after her hospitalization and has continued to do those exercises at home. She stays in rate-controlled atrial fibrillation but is asymptomatic at this time. She has not had any bleeding on Xarelto. Her last LDL was elevated at 85, goal is < 70. We will plan to redraw a direct LDL and CMP to re-evaluate. She is only on Atorvastatin '40mg'$ , so we have room to increase if needed. She had a right heart catheterization back in June 2022 which showed 25% stenosis of her RCA and was otherwise unremarkable.   She was seen by Dr. Burt Knack 06/2021 and at that time she had some issues with diastolic heart failure but other than that it been doing reasonably well.  Some swelling in her feet.  Mild exertional dyspnea and  exercise intolerance with generalized fatigue.  Denied any recent chest pain.  Compliant with furosemide 20 mg daily.  Amlodipine reduced from 10 down to 5 mg daily to help with lower extremity edema.  Today, she***  EKGs/Labs/Other Studies Reviewed:   The following studies were reviewed today: Echocardiogram 07/26/2021  IMPRESSIONS     1. Left ventricular ejection fraction, by estimation, is 50 to 55%. The  left ventricle has low normal function. The left ventricle has no regional  wall motion abnormalities. Left ventricular diastolic parameters are  indeterminate.   2. Right ventricular systolic function is mildly reduced. The right  ventricular size is mildly enlarged. There is normal pulmonary artery  systolic pressure.   3. Left atrial size was moderately dilated.   4. Right atrial size was severely dilated.   5. The mitral valve is grossly normal. Trivial  mitral valve  regurgitation.   6. Tricuspid valve regurgitation is severe.   7. The aortic valve is tricuspid. Aortic valve regurgitation is not  visualized.   8. Aortic no significant aortic root/ascending aortic aneurysm.   9. The inferior vena cava is normal in size with greater than 50%  respiratory variability, suggesting right atrial pressure of 3 mmHg.   Comparison(s): No significant change from prior study.   FINDINGS   Left Ventricle: Left ventricular ejection fraction, by estimation, is 50  to 55%. The left ventricle has low normal function. The left ventricle has  no regional wall motion abnormalities. The left ventricular internal  cavity size was normal in size.  There is borderline left ventricular hypertrophy. Left ventricular  diastolic parameters are indeterminate.   Right Ventricle: The right ventricular size is mildly enlarged. Right  vetricular wall thickness was not well visualized. Right ventricular  systolic function is mildly reduced. There is normal pulmonary artery  systolic pressure. The  tricuspid regurgitant  velocity is 2.45 m/s, and with an assumed right atrial pressure of 3 mmHg,  the estimated right ventricular systolic pressure is 41.6 mmHg.   Left Atrium: Left atrial size was moderately dilated.   Right Atrium: Right atrial size was severely dilated.   Pericardium: There is no evidence of pericardial effusion.   Mitral Valve: The mitral valve is grossly normal. Trivial mitral valve  regurgitation.   Tricuspid Valve: The tricuspid valve is grossly normal. Tricuspid valve  regurgitation is severe. The flow in the hepatic veins is reversed during  ventricular systole.   Aortic Valve: The aortic valve is tricuspid. Aortic valve regurgitation is  not visualized. Aortic regurgitation PHT measures 569 msec.   Pulmonic Valve: The pulmonic valve was grossly normal. Pulmonic valve  regurgitation is mild.   Aorta: No significant aortic root/ascending aortic aneurysm.   Venous: The inferior vena cava is normal in size with greater than 50%  respiratory variability, suggesting right atrial pressure of 3 mmHg.   IAS/Shunts: No atrial level shunt detected by color flow Doppler.   LHC 07/07/2020     Conclusion     Mid RCA lesion is 25% stenosed. LV end diastolic pressure is normal. There is no aortic valve stenosis.   Nonobstructive CAD.  Continue medical therapy.     Recommendations   Antiplatelet/Anticoag Recommend to resume Rivaroxaban, at currently prescribed dose and frequency on 07/08/2020. Concurrent antiplatelet therapy not recommended.    Coronary Diagrams     Diagnostic Dominance: Right            Echo 07/04/2020 1. Left ventricular ejection fraction, by estimation, is 55 to 60%. The  left ventricle has normal function. The left ventricle has no regional  wall motion abnormalities. There is mild left ventricular hypertrophy.  Left ventricular diastolic parameters  are indeterminate.   2. Right ventricular systolic function is mildly  reduced. The right  ventricular size is severely enlarged. There is mildly elevated pulmonary  artery systolic pressure.   3. Left atrial size was moderately dilated.   4. Right atrial size was severely dilated.   5. The mitral valve is normal in structure. Mild mitral valve  regurgitation. No evidence of mitral stenosis.   6. Tricuspid valve regurgitation is severe.   7. The aortic valve is tricuspid. Aortic valve regurgitation is trivial.  Mild aortic valve sclerosis is present, with no evidence of aortic valve  stenosis.   8. Aortic dilatation noted. There is mild dilatation of the ascending aorta,  measuring 40 mm.   9. The inferior vena cava is dilated in size with <50% respirat ory  variability, suggesting right atrial pressure of 15 mmHg.    EKG: No EKG EKG is  ordered today.  The ekg independently reviewed from 08/12/2020 demonstrated atrial fibrillation 117 - 128 bpm. PVc's. No acute ST/T wave changes.    Recent Labs: 09/11/2020: ALT 9; B Natriuretic Peptide 487.3 09/13/2020: BUN 16; Creatinine, Ser 1.15; Hemoglobin 8.4; Magnesium 2.2; Platelets 215; Potassium 4.4; Sodium 136   EKG:  No EKG ordered today.   Recent Labs: 10/12/2021: ALT 10; BUN 13; Creatinine, Ser 1.00; Hemoglobin 9.4; Magnesium 1.8; Platelets 316; Potassium 3.5; Sodium 138  Recent Lipid Panel    Component Value Date/Time   CHOL 148 07/04/2020 0341   TRIG 52 07/04/2020 0341   HDL 53 07/04/2020 0341   CHOLHDL 2.8 07/04/2020 0341   VLDL 10 07/04/2020 0341   LDLCALC 85 07/04/2020 0341   LDLDIRECT 46 12/22/2020 1549    Risk Assessment/Calculations:   CHA2DS2-VASc Score = 6  This indicates a 9.7% annual risk of stroke. The patient's score is based upon: CHF History: 1 HTN History: 1 Diabetes History: 1 Stroke History: 0 Vascular Disease History: 0 Age Score: 2 Gender Score: 1     Home Medications   No outpatient medications have been marked as taking for the 02/08/22 encounter (Appointment) with Elgie Collard, PA-C.     Review of Systems    All other systems reviewed and are otherwise negative except as noted above.  Physical Exam    VS:  There were no vitals taken for this visit. , BMI There is no height or weight on file to calculate BMI.  Wt Readings from Last 3 Encounters:  10/12/21 144 lb (65.3 kg)  06/29/21 145 lb 9.6 oz (66 kg)  04/19/21 143 lb 4.8 oz (65 kg)     GEN: Well nourished, well developed, in no acute distress. HEENT: normal. Cardiac: RRR, no murmurs, rubs, or gallops. No clubbing, cyanosis, edema.  Radials/PT 2+ and equal bilaterally.  Respiratory:  Respirations regular and unlabored, clear to auscultation bilaterally. GI: Soft, nontender, nondistended. MS: No deformity or atrophy. Skin: Warm and dry, no rash. Neuro:  Strength and sensation are intact. Psych: Normal affect.  Assessment & Plan    CAD-this has been stable without any chest pain.  No indication at this time for an ischemic evaluation. GDMT: Toprol, atorvastatin.  No aspirin due to chronic anticoagulation.  Encouraged exercise 30 minutes a day 5 times a week and a heart healthy diet.  Persistent atrial fibrillation on chronic anticoagulation-rate controlled today.  Continue current dose of metoprolol.  She is on Xarelto 15 mg daily due to a CHA2DS2-VASc score of 5.  She denies bleeding. No symptoms.   Systolic dysfunction with mild pulmonary hypertension-continue Lasix 20 mg daily with an additional 20 mg as needed for weight gain of 2 pounds overnight or 5 pounds in 1 week.  She is euvolemic on exam today.  Hyperlipidemia-continue Atorvastatin 40 mg daily.  Most recent LDL was 85 on 07/04/2020.  We will order a repeat direct LDL and CMP today. If LDL not at goal, we will plan to increase her Atorvastatin to '80mg'$  daily.   Hypertension- BP well controlled today and at home. Continue current antihypertensive regimen.    Mild ascending aortic dilatation-40 mm by echocardiogram performed on  07/04/2020.  Continue tight blood pressure control. Last echo above 6/23.  Disposition: Follow up 6  months with Sherren Mocha, MD or APP.  Signed, Elgie Collard, PA-C 02/06/2022, 4:20 PM [facility]

## 2022-02-08 ENCOUNTER — Ambulatory Visit: Payer: Medicare Other | Attending: Physician Assistant | Admitting: Physician Assistant

## 2022-02-08 ENCOUNTER — Encounter: Payer: Self-pay | Admitting: Physician Assistant

## 2022-02-08 VITALS — BP 130/74 | HR 85 | Ht 64.0 in | Wt 142.6 lb

## 2022-02-08 DIAGNOSIS — I4819 Other persistent atrial fibrillation: Secondary | ICD-10-CM

## 2022-02-08 DIAGNOSIS — I10 Essential (primary) hypertension: Secondary | ICD-10-CM | POA: Diagnosis not present

## 2022-02-08 DIAGNOSIS — E785 Hyperlipidemia, unspecified: Secondary | ICD-10-CM | POA: Diagnosis not present

## 2022-02-08 DIAGNOSIS — I5032 Chronic diastolic (congestive) heart failure: Secondary | ICD-10-CM

## 2022-02-08 DIAGNOSIS — I7121 Aneurysm of the ascending aorta, without rupture: Secondary | ICD-10-CM | POA: Diagnosis not present

## 2022-02-08 DIAGNOSIS — I2510 Atherosclerotic heart disease of native coronary artery without angina pectoris: Secondary | ICD-10-CM

## 2022-02-08 NOTE — Patient Instructions (Signed)
Medication Instructions:  Your physician recommends that you continue on your current medications as directed. Please refer to the Current Medication list given to you today.  *If you need a refill on your cardiac medications before your next appointment, please call your pharmacy*   Lab Work: Have your primary care provider draw a lipid panel and lft's when you see them later this month If you have labs (blood work) drawn today and your tests are completely normal, you will receive your results only by: [place] (if you have MyChart) OR A paper copy in the mail If you have any lab test that is abnormal or we need to change your treatment, we will call you to review the results.   Follow-Up: At [facility], you and your health needs are our priority.  As part of our continuing mission to provide you with exceptional heart care, we have created designated Provider Care Teams.  These Care Teams include your primary Cardiologist (physician) and Advanced Practice Providers (APPs -  Physician Assistants and Nurse Practitioners) who all work together to provide you with the care you need, when you need it.  We recommend signing up for the patient portal called "MyChart".  Sign up information is provided on this After Visit Summary.  MyChart is used to connect with patients for Virtual Visits (Telemedicine).  Patients are able to view lab/test results, encounter notes, upcoming appointments, etc.  Non-urgent messages can be sent to your provider as well.   To learn more about what you can do with MyChart, go to [email or web address].    Your next appointment:   6 month(s)  The format for your next appointment:   In Person  Provider:   Sherren Mocha, MD    Important Information About Sugar

## 2022-02-15 ENCOUNTER — Encounter (HOSPITAL_BASED_OUTPATIENT_CLINIC_OR_DEPARTMENT_OTHER): Payer: Self-pay | Admitting: Urology

## 2022-02-15 ENCOUNTER — Emergency Department (HOSPITAL_BASED_OUTPATIENT_CLINIC_OR_DEPARTMENT_OTHER): Payer: Medicare Other

## 2022-02-15 ENCOUNTER — Emergency Department (HOSPITAL_BASED_OUTPATIENT_CLINIC_OR_DEPARTMENT_OTHER)
Admission: EM | Admit: 2022-02-15 | Discharge: 2022-02-15 | Disposition: A | Discharge status: Home / Self Care | Payer: Medicare Other | Attending: Emergency Medicine | Admitting: Emergency Medicine

## 2022-02-15 ENCOUNTER — Other Ambulatory Visit: Payer: Self-pay

## 2022-02-15 DIAGNOSIS — N183 Chronic kidney disease, stage 3 unspecified: Secondary | ICD-10-CM | POA: Diagnosis not present

## 2022-02-15 DIAGNOSIS — R0789 Other chest pain: Secondary | ICD-10-CM | POA: Diagnosis not present

## 2022-02-15 DIAGNOSIS — R079 Chest pain, unspecified: Secondary | ICD-10-CM

## 2022-02-15 DIAGNOSIS — E1122 Type 2 diabetes mellitus with diabetic chronic kidney disease: Secondary | ICD-10-CM | POA: Diagnosis not present

## 2022-02-15 DIAGNOSIS — I129 Hypertensive chronic kidney disease with stage 1 through stage 4 chronic kidney disease, or unspecified chronic kidney disease: Secondary | ICD-10-CM | POA: Diagnosis not present

## 2022-02-15 DIAGNOSIS — Z91010 Allergy to peanuts: Secondary | ICD-10-CM | POA: Diagnosis not present

## 2022-02-15 DIAGNOSIS — Z79899 Other long term (current) drug therapy: Secondary | ICD-10-CM | POA: Insufficient documentation

## 2022-02-15 LAB — CBC
HCT: 31.6 % — ABNORMAL LOW (ref 36.0–46.0)
Hemoglobin: 9.7 g/dL — ABNORMAL LOW (ref 12.0–15.0)
MCH: 25.9 pg — ABNORMAL LOW (ref 26.0–34.0)
MCHC: 30.7 g/dL (ref 30.0–36.0)
MCV: 84.3 fL (ref 80.0–100.0)
Platelets: 352 10*3/uL (ref 150–400)
RBC: 3.75 MIL/uL — ABNORMAL LOW (ref 3.87–5.11)
RDW: 15.5 % (ref 11.5–15.5)
WBC: 8.6 10*3/uL (ref 4.0–10.5)
nRBC: 0 % (ref 0.0–0.2)

## 2022-02-15 LAB — TROPONIN I (HIGH SENSITIVITY)
Troponin I (High Sensitivity): 6 ng/L (ref <18)
Troponin I (High Sensitivity): 4 ng/L (ref <18)

## 2022-02-15 LAB — BASIC METABOLIC PANEL
Anion gap: 10 (ref 5–15)
BUN: 11 mg/dL (ref 8–23)
CO2: 27 mmol/L (ref 22–32)
Calcium: 9.1 mg/dL (ref 8.9–10.3)
Chloride: 99 mmol/L (ref 98–111)
Creatinine, Ser: 1 mg/dL (ref 0.44–1.00)
GFR, Estimated: 55 mL/min — ABNORMAL LOW (ref >60)
Glucose, Bld: 132 mg/dL — ABNORMAL HIGH (ref 70–99)
Potassium: 3.9 mmol/L (ref 3.5–5.1)
Sodium: 136 mmol/L (ref 135–145)

## 2022-02-15 MED ORDER — KETOROLAC TROMETHAMINE 30 MG/ML IJ SOLN
30.0000 mg | Freq: Once | INTRAMUSCULAR | Status: AC
  Administered 2022-02-15: 30 mg via INTRAVENOUS
  Filled 2022-02-15: qty 1

## 2022-02-15 NOTE — ED Triage Notes (Signed)
Left sided chest pain radiating to neck, back, and left arm x 2 days States SOB related to pain and slight dizziness   H/o MI

## 2022-02-15 NOTE — ED Notes (Signed)
Written and verbal inst to pt  Verbalized an understanding  To home with family  

## 2022-02-15 NOTE — ED Notes (Signed)
ED Provider at bedside. 

## 2022-02-15 NOTE — ED Provider Notes (Signed)
[facility] EMERGENCY DEPARTMENT Provider Note   CSN: [id number] Arrival date & time: 02/15/22  1600     History  Chief Complaint  Patient presents with   Chest Pain    [name] is a 89 y.o. female presented ED with left-sided chest discomfort.  She reports has been going on and off for the past 3 days.  She is having a pinching sensation in her left chest that radiates towards her left shoulder.  She does feel she has some lightheadedness with this.  He says it is similar to symptoms she has had in the past but cannot quantify how often she gets this sensation.  She does not feel that it is worse with breathing, but certain movements seem to make it worse.  She denies nausea, vomiting, diaphoresis.  She is here with her son at the bedside.  Records show she was seen by cardiologist 1 week ago in the office, Nicholes Rough PA; notes patient has a history of longstanding persistent A-fib, non ischemic cardiomyopathy, hypertension, stage III kidney disease, as well as diabetes.  In May 2022 she was admitted to the hospital with chest pain/NSTEMI (Troponins maxed at 3872), and there was the possibility of inferior injury on EKG and she underwent cardiac catheterization,  in June 2022 with 25% mid RCA lesion, otherwise no significant abnormalities noted.  She was treated with IV heparin initially in the hospital as well as pain medications with improvement of her pain.  She was also seen again for atypical chest pain in June.  She also has a history of A-fib and has been compliant with Xarelto for this.  HPI     Home Medications Prior to Admission medications   Medication Sig Start Date End Date Taking? Authorizing Provider  albuterol (VENTOLIN HFA) 108 (90 Base) MCG/ACT inhaler Inhale 2 puffs into the lungs every 4 (four) hours as needed for wheezing or shortness of breath.    [provider]  amLODipine (NORVASC) 5 MG tablet Take 1 tablet (5 mg total) by mouth  daily. 06/29/21   Sherren Mocha, MD  atorvastatin (LIPITOR) 40 MG tablet TAKE 1 TABLET BY MOUTH EVERY DAY 08/25/21   Sherren Mocha, MD  celecoxib (CELEBREX) 200 MG capsule Take 1 capsule (200 mg total) by mouth 2 (two) times daily. Patient not taking: Reported on 02/08/2022 10/12/21   Isla Pence, MD  Cholecalciferol (VITAMIN D) 2000 units tablet Take 2,000 Units by mouth daily.    [provider]  FLOVENT HFA 110 MCG/ACT inhaler TAKE 2 PUFFS BY MOUTH TWICE A DAY 03/21/17   Bobbitt, [place] Muta, MD  fluticasone [facility]) 50 MCG/ACT nasal spray Place 1 spray into both nostrils as needed. 03/14/21   [provider]  furosemide (LASIX) 20 MG tablet TAKE 1 TABLET BY MOUTH EVERY DAY 05/04/21   Elgie Collard, PA-C  gabapentin (NEURONTIN) 100 MG capsule Take 100-200 mg by mouth at bedtime. 06/22/20   [provider]  HYDROcodone-acetaminophen (NORCO/VICODIN) 5-325 MG tablet Take 1 tablet by mouth every 8 (eight) hours as needed for moderate pain or severe pain. 07/21/21   Lorine Bears, NP  hydrocortisone (ANUSOL-HC) 2.5 % rectal cream Place 1 Application rectally 2 (two) times daily. Patient not taking: Reported on 02/08/2022 10/12/21   Isla Pence, MD  ketorolac (ACULAR) 0.5 % ophthalmic solution Place into both eyes daily. 11/02/20   [provider]  latanoprost (XALATAN) 0.005 % ophthalmic solution SMARTSIG:1 Drop(s) In Eye(s) Every Evening 01/09/22  [provider]  liver oil-zinc oxide (DESITIN) 40 % ointment Apply topically as needed for irritation. 09/13/20   Geradine Girt, DO  metoprolol tartrate (LOPRESSOR) 25 MG tablet TAKE 1 TABLET BY MOUTH TWICE A DAY 09/22/21   Sherren Mocha, MD  omeprazole (PRILOSEC) 40 MG capsule Take 1 capsule (40 mg total) by mouth daily. 09/23/20   Loel Dubonnet, NP  Rivaroxaban (XARELTO) 15 MG TABS tablet Take 1 tablet (15 mg total) by mouth daily with supper. 07/09/20   Cheryln Manly, NP  traMADol (ULTRAM) 50 MG  tablet Take 1 tablet (50 mg total) by mouth every 12 (twelve) hours as needed. 11/06/21   Aundra Dubin, PA-C      Allergies    Ferumoxytol, Chocolate, Lisinopril, and Peanuts [peanut oil]    Review of Systems   Review of Systems  Physical Exam Updated Vital Signs BP (!) 144/66 (BP Location: Left Arm)   Pulse 87   Temp 98.6 F (37 C) (Oral)   Resp 14   Ht '5\' 4"'$  (1.626 m)   Wt 64.7 kg   SpO2 100%   BMI 24.48 kg/m  Physical Exam Constitutional:      General: She is not in acute distress. HENT:     Head: Normocephalic and atraumatic.  Eyes:     Conjunctiva/sclera: Conjunctivae normal.     Pupils: Pupils are equal, round, and reactive to light.  Cardiovascular:     Rate and Rhythm: Normal rate and regular rhythm.  Pulmonary:     Effort: Pulmonary effort is normal. No respiratory distress.  Abdominal:     General: There is no distension.     Tenderness: There is no abdominal tenderness.  Skin:    General: Skin is warm and dry.  Neurological:     General: No focal deficit present.     Mental Status: She is alert. Mental status is at baseline.  Psychiatric:        Mood and Affect: Mood normal.        Behavior: Behavior normal.     ED Results / Procedures / Treatments   Labs (all labs ordered are listed, but only abnormal results are displayed) Labs Reviewed  BASIC METABOLIC PANEL - Abnormal; Notable for the following components:      Result Value   Glucose, Bld 132 (*)    GFR, Estimated 55 (*)    All other components within normal limits  CBC - Abnormal; Notable for the following components:   RBC 3.75 (*)    Hemoglobin 9.7 (*)    HCT 31.6 (*)    MCH 25.9 (*)    All other components within normal limits  TROPONIN I (HIGH SENSITIVITY)  TROPONIN I (HIGH SENSITIVITY)    EKG EKG Interpretation  Date/Time:  Wednesday February 15 2022 16:11:04 EST Ventricular Rate:  98 PR Interval:    QRS Duration: 135 QT Interval:  384 QTC Calculation: 441 R  Axis:   110 Text Interpretation: Atrial fibrillation Nonspecific intraventricular conduction delay Probable anteroseptal infarct, old Confirmed by Octaviano Glow [id number]) on 02/15/2022 4:26:25 PM  Radiology DG Chest Port 1 View  Result Date: 02/15/2022 CLINICAL DATA:  Left-sided chest pain radiating into the neck, back and left arm for 2 days. Shortness of breath. EXAM: PORTABLE CHEST 1 VIEW COMPARISON:  Radiographs 07/05/2021 and 04/19/2021.  CT 10/12/2021. FINDINGS: 1624 hours. Stable cardiomegaly and aortic atherosclerosis. Diffuse interstitial prominence appears largely chronic, although there may be mild superimposed edema. Asymmetric left upper  lobe component attributed to prior radiation therapy. There is no airspace opacity, significant pleural effusion or pneumothorax. Surgical clips are present in the left axilla. The bones appear unremarkable. Telemetry leads overlie the chest. IMPRESSION: 1. Cardiomegaly with possible mild superimposed edema. No focal airspace opacity. 2. Chronic interstitial prominence and left upper lobe radiation therapy changes. Electronically Signed   By: Richardean Sale M.D.   On: 02/15/2022 17:17    Procedures Procedures    Medications Ordered in ED Medications  ketorolac (TORADOL) 30 MG/ML injection 30 mg (30 mg Intravenous Given 02/15/22 1847)    ED Course/ Medical Decision Making/ A&P Clinical Course as of 02/15/22 2037  Wed Feb 15, 2022  2001 Patient is completely asymptomatic now after Toradol was given.  I strongly suspect is musculoskeletal pain.  Delta troponins are flat.  She is stable for discharge. [MT]    Clinical Course User Index [MT] Nickalous Stingley, Carola Rhine, MD                           Medical Decision Making Amount and/or Complexity of Data Reviewed Labs: ordered. Radiology: ordered. ECG/medicine tests: ordered.  Risk Prescription drug management.   This patient presents to the Emergency Department with complaint of chest pain. This  involves an extensive number of treatment options, and is a complaint that carries with it a high risk of complications and morbidity, given the patient's comorbidity, including coronary disease, hypertension, diabetes.The differential diagnosis includes ACS vs Pneumothorax vs Reflux/Gastritis vs MSK pain vs Pneumonia vs other.  I felt PE was less likely given that she is compliant on Xarelto, she has no hypoxia, no tachycardia, and her symptoms are waxing and waning.  I ordered, reviewed, and interpreted labs.  Pertinent results include delta troponin flat and negative I ordered medication IV Toradol for chest pain I ordered imaging studies which included x-ray of the chest I independently visualized and interpreted imaging which showed no focal abnormalities/ RUQ scar tissue from known mastectomy/radiation, and the monitor tracing which showed no significant arrhythmias. I agree with the radiologist interpretation  External records obtained and reviewed showing LHC, hospitalization for NSTEMI in 2022 as noted above  I personally reviewed the patients ECG which showed A fib without acute ischemic changes, occasional PVC  After the interventions stated above, I reevaluated the patient and found that they were significantly improved back to baseline, no active pain  Based on the patient's clinical exam, vital signs, risk factors, and ED testing, I felt that the patient's overall risk of life-threatening emergency such as ACS, PE, sepsis, or infection was low.  At this time, I felt the patient's presentation was most clinically consistent with chest wall or musculoskeletal pain, but explained to the patient that this evaluation was not a definitive diagnostic workup.  I discussed outpatient follow up with primary care provider, and provided specialist office number on the patient's discharge paper if a referral was deemed necessary.  Return precautions were discussed with the patient.  I felt the  patient was clinically stable for discharge.         Final Clinical Impression(s) / ED Diagnoses Final diagnoses:  Chest pain, unspecified type    Rx / DC Orders ED Discharge Orders     None         Wyvonnia Dusky, MD 02/15/22 2037

## 2022-05-12 ENCOUNTER — Encounter (HOSPITAL_BASED_OUTPATIENT_CLINIC_OR_DEPARTMENT_OTHER): Payer: Self-pay

## 2022-05-12 ENCOUNTER — Emergency Department (HOSPITAL_BASED_OUTPATIENT_CLINIC_OR_DEPARTMENT_OTHER)
Admission: EM | Admit: 2022-05-12 | Discharge: 2022-05-12 | Disposition: A | Discharge status: Home / Self Care | Payer: 59 | Attending: Emergency Medicine | Admitting: Emergency Medicine

## 2022-05-12 DIAGNOSIS — Z79899 Other long term (current) drug therapy: Secondary | ICD-10-CM | POA: Insufficient documentation

## 2022-05-12 DIAGNOSIS — L299 Pruritus, unspecified: Secondary | ICD-10-CM | POA: Insufficient documentation

## 2022-05-12 DIAGNOSIS — Z91010 Allergy to peanuts: Secondary | ICD-10-CM | POA: Insufficient documentation

## 2022-05-12 NOTE — ED Notes (Addendum)
Discharge instructions reviewed with patient. Patient verbalizes understanding, no further questions at this time. Follow up information provided. No acute distress noted at time of departure.  

## 2022-05-12 NOTE — ED Provider Notes (Signed)
[facility] DEPARTMENT AT [facility] Provider Note   CSN: [id number] Arrival date & time: 05/12/22  0813     History  Chief Complaint  Patient presents with   Pruritis    [name] is a 89 y.o. female.  89 yo F with a chief complaint of diffuse itching.  Going on for weeks.  She had seen her family doctor for this and was started on a medication but does not feel like it helped.  She felt she could not sleep the past couple nights due to the amount of itching.  Tells me the itching is diffusely but worse in the groin.  Denies fevers denies new medications.        Home Medications Prior to Admission medications   Medication Sig Start Date End Date Taking? Authorizing Provider  albuterol (VENTOLIN HFA) 108 (90 Base) MCG/ACT inhaler Inhale 2 puffs into the lungs every 4 (four) hours as needed for wheezing or shortness of breath.    [provider]  amLODipine (NORVASC) 5 MG tablet Take 1 tablet (5 mg total) by mouth daily. 06/29/21   Tonny Bollman, MD  atorvastatin (LIPITOR) 40 MG tablet TAKE 1 TABLET BY MOUTH EVERY DAY 08/25/21   Tonny Bollman, MD  celecoxib (CELEBREX) 200 MG capsule Take 1 capsule (200 mg total) by mouth 2 (two) times daily. Patient not taking: Reported on 02/08/2022 10/12/21   Jacalyn Lefevre, MD  Cholecalciferol (VITAMIN D) 2000 units tablet Take 2,000 Units by mouth daily.    [provider]  FLOVENT HFA 110 MCG/ACT inhaler TAKE 2 PUFFS BY MOUTH TWICE A DAY 03/21/17   Bobbitt, Heywood Iles, MD  fluticasone [facility]) 50 MCG/ACT nasal spray Place 1 spray into both nostrils as needed. 03/14/21   [provider]  furosemide (LASIX) 20 MG tablet TAKE 1 TABLET BY MOUTH EVERY DAY 05/04/21   Sharlene Dory, PA-C  gabapentin (NEURONTIN) 100 MG capsule Take 100-200 mg by mouth at bedtime. 06/22/20   [provider]  HYDROcodone-acetaminophen (NORCO/VICODIN) 5-325 MG tablet Take 1 tablet by mouth every 8 (eight)  hours as needed for moderate pain or severe pain. 07/21/21   Juanda Chance, NP  hydrocortisone (ANUSOL-HC) 2.5 % rectal cream Place 1 Application rectally 2 (two) times daily. Patient not taking: Reported on 02/08/2022 10/12/21   Jacalyn Lefevre, MD  ketorolac (ACULAR) 0.5 % ophthalmic solution Place into both eyes daily. 11/02/20   [provider]  latanoprost (XALATAN) 0.005 % ophthalmic solution SMARTSIG:1 Drop(s) In Eye(s) Every Evening 01/09/22   [provider]  liver oil-zinc oxide (DESITIN) 40 % ointment Apply topically as needed for irritation. 09/13/20   Joseph Art, DO  metoprolol tartrate (LOPRESSOR) 25 MG tablet TAKE 1 TABLET BY MOUTH TWICE A DAY 09/22/21   Tonny Bollman, MD  omeprazole (PRILOSEC) 40 MG capsule Take 1 capsule (40 mg total) by mouth daily. 09/23/20   Alver Sorrow, NP  Rivaroxaban (XARELTO) 15 MG TABS tablet Take 1 tablet (15 mg total) by mouth daily with supper. 07/09/20   Arty Baumgartner, NP  traMADol (ULTRAM) 50 MG tablet Take 1 tablet (50 mg total) by mouth every 12 (twelve) hours as needed. 11/06/21   Cristie Hem, PA-C      Allergies    Ferumoxytol, Chocolate, Lisinopril, and Peanuts [peanut oil]    Review of Systems   Review of Systems  Physical Exam Updated Vital Signs BP (!) 158/72 (BP Location: Right Arm)  Pulse 65   Temp (!) 97.5 F (36.4 C) (Oral)   Resp 17   Ht 5\' 4"  (1.626 m)   Wt 64.4 kg   SpO2 100%   BMI 24.37 kg/m  Physical Exam Vitals and nursing note reviewed.  Constitutional:      General: She is not in acute distress.    Appearance: She is well-developed. She is not diaphoretic.  HENT:     Head: Normocephalic and atraumatic.  Eyes:     Pupils: Pupils are equal, round, and reactive to light.  Cardiovascular:     Rate and Rhythm: Normal rate and regular rhythm.     Heart sounds: No murmur heard.    No friction rub. No gallop.  Pulmonary:     Effort: Pulmonary effort is normal.     Breath sounds: No  wheezing or rales.  Abdominal:     General: There is no distension.     Palpations: Abdomen is soft.     Tenderness: There is no abdominal tenderness.  Musculoskeletal:        General: No tenderness.     Cervical back: Normal range of motion and neck supple.  Skin:    General: Skin is warm and dry.     Comments: The skin is dry diffusely.  I see no erythema no rash, no lesions.  The vulva is without any erythema.  No jaundice.   Neurological:     Mental Status: She is alert and oriented to person, place, and time.  Psychiatric:        Behavior: Behavior normal.     ED Results / Procedures / Treatments   Labs (all labs ordered are listed, but only abnormal results are displayed) Labs Reviewed - No data to display  EKG None  Radiology No results found.  Procedures Procedures    Medications Ordered in ED Medications - No data to display  ED Course/ Medical Decision Making/ A&P                             Medical Decision Making  89 yo F with a cc of itching.  This is diffuse.  She focuses on her groin as area of most itching.  There is no obvious rash.  She does have diffuse dry skin, wonder if this is the cause of her symptoms.  Will have her try a lotion a couple times a day.  She has no jaundice, I do not suspect that she has hyperbilirubinemia causing her itching.  I encouraged her to follow-up with her family doctor in the office.  9:00 AM:  I have discussed the diagnosis/risks/treatment options with the patient.  Evaluation and diagnostic testing in the emergency department does not suggest an emergent condition requiring admission or immediate intervention beyond what has been performed at this time.  They will follow up with PCP. We also discussed returning to the ED immediately if new or worsening sx occur. We discussed the sx which are most concerning (e.g., sudden worsening pain, fever, inability to tolerate by mouth) that necessitate immediate return. Medications  administered to the patient during their visit and any new prescriptions provided to the patient are listed below.  Medications given during this visit Medications - No data to display   The patient appears reasonably screen and/or stabilized for discharge and I doubt any other medical condition or other [facility] requiring further screening, evaluation, or treatment in the ED at this  time prior to discharge.          Final Clinical Impression(s) / ED Diagnoses Final diagnoses:  Itching    Rx / DC Orders ED Discharge Orders     None         Melene PlanFloyd, Jaylah Goodlow, DO 05/12/22 0900

## 2022-05-12 NOTE — Discharge Instructions (Signed)
I would start with applying a lotion a couple times a day, I would have you use 1 that has no dyes or detergents in it.  Please let your family doctor know that you are having ongoing symptoms.  They may want to send you to a dermatologist if things or not working or they may obtain lab work.  They might also change your medications if they think they may be causing your symptoms.

## 2022-05-12 NOTE — ED Triage Notes (Addendum)
C/o itching to bilateral hands, legs, feet, vagina and rectum x several months, worse the past week. Denies vaginal discharge. Also states she has bruises on legs that are unexplained.  Seen at [facility] for same thing 2 days ago.

## 2022-06-29 ENCOUNTER — Other Ambulatory Visit: Payer: Self-pay | Admitting: Cardiovascular Disease

## 2022-07-23 ENCOUNTER — Encounter (HOSPITAL_BASED_OUTPATIENT_CLINIC_OR_DEPARTMENT_OTHER): Payer: Self-pay | Admitting: Emergency Medicine

## 2022-07-23 ENCOUNTER — Encounter (HOSPITAL_COMMUNITY)
Admission: EM | Disposition: A | Discharge status: Home / Self Care | Payer: Self-pay | Source: Home / Self Care | Attending: Emergency Medicine

## 2022-07-23 ENCOUNTER — Emergency Department (HOSPITAL_BASED_OUTPATIENT_CLINIC_OR_DEPARTMENT_OTHER): Payer: 59

## 2022-07-23 ENCOUNTER — Emergency Department (HOSPITAL_BASED_OUTPATIENT_CLINIC_OR_DEPARTMENT_OTHER): Payer: 59 | Admitting: Anesthesiology

## 2022-07-23 ENCOUNTER — Emergency Department (HOSPITAL_COMMUNITY): Payer: 59 | Admitting: Anesthesiology

## 2022-07-23 ENCOUNTER — Other Ambulatory Visit: Payer: Self-pay

## 2022-07-23 ENCOUNTER — Ambulatory Visit (HOSPITAL_BASED_OUTPATIENT_CLINIC_OR_DEPARTMENT_OTHER)
Admission: EM | Admit: 2022-07-23 | Discharge: 2022-07-23 | Disposition: A | Discharge status: Home / Self Care | Payer: 59 | Attending: Emergency Medicine | Admitting: Emergency Medicine

## 2022-07-23 DIAGNOSIS — K2950 Unspecified chronic gastritis without bleeding: Secondary | ICD-10-CM | POA: Diagnosis not present

## 2022-07-23 DIAGNOSIS — Z9989 Dependence on other enabling machines and devices: Secondary | ICD-10-CM

## 2022-07-23 DIAGNOSIS — I252 Old myocardial infarction: Secondary | ICD-10-CM | POA: Diagnosis not present

## 2022-07-23 DIAGNOSIS — F458 Other somatoform disorders: Secondary | ICD-10-CM | POA: Insufficient documentation

## 2022-07-23 DIAGNOSIS — E785 Hyperlipidemia, unspecified: Secondary | ICD-10-CM | POA: Insufficient documentation

## 2022-07-23 DIAGNOSIS — Z8249 Family history of ischemic heart disease and other diseases of the circulatory system: Secondary | ICD-10-CM | POA: Diagnosis not present

## 2022-07-23 DIAGNOSIS — E119 Type 2 diabetes mellitus without complications: Secondary | ICD-10-CM | POA: Insufficient documentation

## 2022-07-23 DIAGNOSIS — I4891 Unspecified atrial fibrillation: Secondary | ICD-10-CM | POA: Insufficient documentation

## 2022-07-23 DIAGNOSIS — E1122 Type 2 diabetes mellitus with diabetic chronic kidney disease: Secondary | ICD-10-CM | POA: Diagnosis not present

## 2022-07-23 DIAGNOSIS — Z7901 Long term (current) use of anticoagulants: Secondary | ICD-10-CM | POA: Diagnosis not present

## 2022-07-23 DIAGNOSIS — K2211 Ulcer of esophagus with bleeding: Secondary | ICD-10-CM | POA: Insufficient documentation

## 2022-07-23 DIAGNOSIS — G4733 Obstructive sleep apnea (adult) (pediatric): Secondary | ICD-10-CM

## 2022-07-23 DIAGNOSIS — K219 Gastro-esophageal reflux disease without esophagitis: Secondary | ICD-10-CM | POA: Insufficient documentation

## 2022-07-23 DIAGNOSIS — K2970 Gastritis, unspecified, without bleeding: Secondary | ICD-10-CM

## 2022-07-23 DIAGNOSIS — N189 Chronic kidney disease, unspecified: Secondary | ICD-10-CM | POA: Insufficient documentation

## 2022-07-23 DIAGNOSIS — K319 Disease of stomach and duodenum, unspecified: Secondary | ICD-10-CM | POA: Diagnosis not present

## 2022-07-23 DIAGNOSIS — R1310 Dysphagia, unspecified: Secondary | ICD-10-CM | POA: Diagnosis present

## 2022-07-23 DIAGNOSIS — M1990 Unspecified osteoarthritis, unspecified site: Secondary | ICD-10-CM | POA: Diagnosis not present

## 2022-07-23 DIAGNOSIS — I509 Heart failure, unspecified: Secondary | ICD-10-CM | POA: Diagnosis not present

## 2022-07-23 DIAGNOSIS — I2510 Atherosclerotic heart disease of native coronary artery without angina pectoris: Secondary | ICD-10-CM | POA: Diagnosis not present

## 2022-07-23 DIAGNOSIS — K2100 Gastro-esophageal reflux disease with esophagitis, without bleeding: Secondary | ICD-10-CM | POA: Diagnosis not present

## 2022-07-23 DIAGNOSIS — K2081 Other esophagitis with bleeding: Secondary | ICD-10-CM

## 2022-07-23 DIAGNOSIS — I130 Hypertensive heart and chronic kidney disease with heart failure and stage 1 through stage 4 chronic kidney disease, or unspecified chronic kidney disease: Secondary | ICD-10-CM | POA: Diagnosis not present

## 2022-07-23 DIAGNOSIS — R0789 Other chest pain: Secondary | ICD-10-CM | POA: Diagnosis not present

## 2022-07-23 DIAGNOSIS — W44F3XA Food entering into or through a natural orifice, initial encounter: Secondary | ICD-10-CM

## 2022-07-23 DIAGNOSIS — J45909 Unspecified asthma, uncomplicated: Secondary | ICD-10-CM | POA: Diagnosis not present

## 2022-07-23 DIAGNOSIS — R079 Chest pain, unspecified: Secondary | ICD-10-CM | POA: Diagnosis present

## 2022-07-23 DIAGNOSIS — I110 Hypertensive heart disease with heart failure: Secondary | ICD-10-CM | POA: Insufficient documentation

## 2022-07-23 HISTORY — PX: ESOPHAGOGASTRODUODENOSCOPY: SHX5428

## 2022-07-23 HISTORY — PX: BIOPSY: SHX5522

## 2022-07-23 LAB — CBC WITH DIFFERENTIAL/PLATELET
Abs Immature Granulocytes: 0.03 10*3/uL (ref 0.00–0.07)
Basophils Absolute: 0.1 10*3/uL (ref 0.0–0.1)
Basophils Relative: 1 %
Eosinophils Absolute: 0.2 10*3/uL (ref 0.0–0.5)
Eosinophils Relative: 2 %
HCT: 28 % — ABNORMAL LOW (ref 36.0–46.0)
Hemoglobin: 8.6 g/dL — ABNORMAL LOW (ref 12.0–15.0)
Immature Granulocytes: 0 %
Lymphocytes Relative: 21 %
Lymphs Abs: 1.6 10*3/uL (ref 0.7–4.0)
MCH: 25.2 pg — ABNORMAL LOW (ref 26.0–34.0)
MCHC: 30.7 g/dL (ref 30.0–36.0)
MCV: 82.1 fL (ref 80.0–100.0)
Monocytes Absolute: 0.8 10*3/uL (ref 0.1–1.0)
Monocytes Relative: 11 %
Neutro Abs: 4.7 10*3/uL (ref 1.7–7.7)
Neutrophils Relative %: 65 %
Platelets: 322 10*3/uL (ref 150–400)
RBC: 3.41 MIL/uL — ABNORMAL LOW (ref 3.87–5.11)
RDW: 17.2 % — ABNORMAL HIGH (ref 11.5–15.5)
WBC: 7.4 10*3/uL (ref 4.0–10.5)
nRBC: 0 % (ref 0.0–0.2)

## 2022-07-23 LAB — COMPREHENSIVE METABOLIC PANEL
ALT: 8 U/L (ref 0–44)
AST: 22 U/L (ref 15–41)
Albumin: 3.9 g/dL (ref 3.5–5.0)
Alkaline Phosphatase: 49 U/L (ref 38–126)
Anion gap: 10 (ref 5–15)
BUN: 16 mg/dL (ref 8–23)
CO2: 24 mmol/L (ref 22–32)
Calcium: 9.2 mg/dL (ref 8.9–10.3)
Chloride: 102 mmol/L (ref 98–111)
Creatinine, Ser: 0.84 mg/dL (ref 0.44–1.00)
GFR, Estimated: >60 mL/min (ref >60)
Glucose, Bld: 100 mg/dL — ABNORMAL HIGH (ref 70–99)
Potassium: 3.9 mmol/L (ref 3.5–5.1)
Sodium: 136 mmol/L (ref 135–145)
Total Bilirubin: 0.8 mg/dL (ref 0.3–1.2)
Total Protein: 7.9 g/dL (ref 6.5–8.1)

## 2022-07-23 LAB — GLUCOSE, CAPILLARY: Glucose-Capillary: 66 mg/dL — ABNORMAL LOW (ref 70–99)

## 2022-07-23 LAB — TROPONIN I (HIGH SENSITIVITY): Troponin I (High Sensitivity): 6 ng/L (ref <18)

## 2022-07-23 SURGERY — EGD (ESOPHAGOGASTRODUODENOSCOPY)
Anesthesia: Monitor Anesthesia Care

## 2022-07-23 SURGERY — EGD (ESOPHAGOGASTRODUODENOSCOPY)
Anesthesia: General

## 2022-07-23 MED ORDER — DEXAMETHASONE SODIUM PHOSPHATE 10 MG/ML IJ SOLN
INTRAMUSCULAR | Status: DC | PRN
  Administered 2022-07-23: 10 mg via INTRAVENOUS

## 2022-07-23 MED ORDER — ONDANSETRON HCL 4 MG/2ML IJ SOLN: 4.0000 mg | Freq: Once | INTRAMUSCULAR | Status: DC | PRN

## 2022-07-23 MED ORDER — PROPOFOL 10 MG/ML IV BOLUS
INTRAVENOUS | Status: AC
  Filled 2022-07-23: qty 20

## 2022-07-23 MED ORDER — PROPOFOL 10 MG/ML IV BOLUS
INTRAVENOUS | Status: DC | PRN
  Administered 2022-07-23: 100 mg via INTRAVENOUS

## 2022-07-23 MED ORDER — SODIUM CHLORIDE 0.9 % IV SOLN: INTRAVENOUS | Status: DC

## 2022-07-23 MED ORDER — ROCURONIUM BROMIDE 10 MG/ML (PF) SYRINGE
PREFILLED_SYRINGE | INTRAVENOUS | Status: DC | PRN
  Administered 2022-07-23: 100 mg via INTRAVENOUS

## 2022-07-23 MED ORDER — SUCRALFATE 1 G PO TABS: 1.0000 g | ORAL_TABLET | Freq: Four times a day (QID) | ORAL | 0 refills | Status: DC

## 2022-07-23 MED ORDER — FENTANYL CITRATE (PF) 100 MCG/2ML IJ SOLN
INTRAMUSCULAR | Status: DC | PRN
  Administered 2022-07-23: 50 ug via INTRAVENOUS

## 2022-07-23 MED ORDER — ALUM & MAG HYDROXIDE-SIMETH 200-200-20 MG/5ML PO SUSP
30.0000 mL | Freq: Once | ORAL | Status: AC
  Administered 2022-07-23: 30 mL via ORAL
  Filled 2022-07-23: qty 30

## 2022-07-23 MED ORDER — LACTATED RINGERS IV SOLN: INTRAVENOUS | Status: DC | PRN

## 2022-07-23 MED ORDER — GLUCAGON HCL RDNA (DIAGNOSTIC) 1 MG IJ SOLR
1.0000 mg | Freq: Once | INTRAMUSCULAR | Status: AC
  Administered 2022-07-23: 1 mg via INTRAVENOUS
  Filled 2022-07-23: qty 1

## 2022-07-23 MED ORDER — FENTANYL CITRATE (PF) 100 MCG/2ML IJ SOLN: 25.0000 ug | INTRAMUSCULAR | Status: DC | PRN

## 2022-07-23 MED ORDER — SUCRALFATE 1 GM/10ML PO SUSP
1.0000 g | Freq: Three times a day (TID) | ORAL | Status: DC
  Administered 2022-07-23: 1 g via ORAL
  Filled 2022-07-23: qty 10

## 2022-07-23 MED ORDER — DEXMEDETOMIDINE HCL IN NACL 80 MCG/20ML IV SOLN
INTRAVENOUS | Status: AC
  Filled 2022-07-23: qty 20

## 2022-07-23 MED ORDER — LACTATED RINGERS IV SOLN: INTRAVENOUS | Status: DC

## 2022-07-23 MED ORDER — LIDOCAINE 2% (20 MG/ML) 5 ML SYRINGE
INTRAMUSCULAR | Status: DC | PRN
  Administered 2022-07-23: 80 mg via INTRAVENOUS

## 2022-07-23 MED ORDER — ONDANSETRON HCL 4 MG/2ML IJ SOLN
INTRAMUSCULAR | Status: DC | PRN
  Administered 2022-07-23: 4 mg via INTRAVENOUS

## 2022-07-23 MED ORDER — RIVAROXABAN 15 MG PO TABS: 15.0000 mg | ORAL_TABLET | Freq: Every day | ORAL | 1 refills | Status: AC

## 2022-07-23 MED ORDER — PANTOPRAZOLE SODIUM 40 MG PO TBEC: 40.0000 mg | DELAYED_RELEASE_TABLET | Freq: Every day | ORAL | 1 refills | Status: AC

## 2022-07-23 NOTE — Interval H&P Note (Signed)
History and Physical Interval Note:  07/23/2022 8:00 PM  [name]  has presented today for surgery, with the diagnosis of food impaction.  The various methods of treatment have been discussed with the patient and family. After consideration of risks, benefits and other options for treatment, the patient has consented to  Procedure(s): ESOPHAGOGASTRODUODENOSCOPY (EGD) (N/A) as a surgical intervention.  The patient's history has been reviewed, patient examined, no change in status, stable for surgery.  I have reviewed the patient's chart and labs.  Questions were answered to the patient's satisfaction.     Lynann Bologna

## 2022-07-23 NOTE — ED Triage Notes (Signed)
Pt reports RT side CP w/ some radiation to upper back and bil shoulders since yesterday afternoon; also reports she is unable to swallow liquids or solids since about the same time; she feels like it gets to bottom of throat and then comes back up

## 2022-07-23 NOTE — Consult Note (Signed)
[facility] Consult  Referring Provider: No ref. provider found Primary Care Physician:  Brown, Beverly Ann, NP Primary Gastroenterologist: [facility] (Dr. Outlaw)  Reason for Consultation: dysphagia, possible food bolus  SUBJECTIVE:   HPI: [name] is a 89 y.o. female with past medical history significant for atrial fibrillation (on Xarelto, last dose 07/22/22 PM), intermittent anemia and dysphagia. Had dysphagia and followed up with Dr. Outlaw in office on 08/23/20, subsequently had barium swallow evaluation on 08/27/20 which showed no mass/stricture/ulceration, barium tablet passed easily, small reducible hiatal hernia, mild induced reflux. She noted having EGD in the distant past > 10 years prior. Colonoscopy roughly 5 years prior, not recommended to have repeat secondary to age.   Noted having anterior chest wall discomfort that began around 0900/1000 on 07/22/22. She attempted to eat a turkey sandwich yesterday and had to regurgitate some of it. She denied prior history with swallowing meats, breads, pills. She denied abdominal pain. Had some shortness of breath on presentation to hospital, now improved. No constipation or diarrhea. No blood per rectum.   Labs on presentation showed Hgb 8.6 (was 9.7 on 02/15/22), PLT 322, WBC 7.4. CXR showed bilateral interstitial opacities, L>R.  Past Medical History:  Diagnosis Date   Arthritis    "all over"   Atrial fibrillation (HCC)    Diagnosed ~2009   Breast cancer, left breast (HCC) 02/07/1996   S/P chemo and mastectomy    CHF (congestive heart failure) (HCC)    GERD (gastroesophageal reflux disease)    Hyperlipidemia    Hypertension    Left shoulder pain    "MRI showed pinched nerve" - per pt   MI (myocardial infarction) (HCC)    OSA on CPAP    Pneumonia    "4-5 times" (01/06/2015)   Type II diabetes mellitus (HCC)    Past Surgical History:  Procedure Laterality Date   BREAST BIOPSY Left 1998   CATARACT EXTRACTION  W/ INTRAOCULAR LENS  IMPLANT, BILATERAL Bilateral    JOINT REPLACEMENT     LAPAROSCOPIC CHOLECYSTECTOMY     LEFT HEART CATH AND CORONARY ANGIOGRAPHY N/A 07/07/2020   Procedure: LEFT HEART CATH AND CORONARY ANGIOGRAPHY;  Surgeon: Varanasi, Jayadeep S, MD;  Location: [facility];  Service: Cardiovascular;  Laterality: N/A;   MASTECTOMY Left 1998   TOTAL KNEE ARTHROPLASTY Left 2001   TOTAL KNEE REVISION Left 01/28/2013   Procedure: LEFT TOTAL KNEE ARTHROPLASTY REVISION;  Surgeon: Naiping Michael Xu, MD;  Location: [facility] OR;  Service: Orthopedics;  Laterality: Left;   Prior to Admission medications   Medication Sig Start Date End Date Taking? Authorizing Provider  amLODipine (NORVASC) 5 MG tablet TAKE 1 TABLET (5 MG TOTAL) BY MOUTH DAILY. 06/29/22  Yes Cooper, Michael, MD  atorvastatin (LIPITOR) 40 MG tablet TAKE 1 TABLET BY MOUTH EVERY DAY 08/25/21  Yes Cooper, Michael, MD  celecoxib (CELEBREX) 200 MG capsule Take 1 capsule (200 mg total) by mouth 2 (two) times daily. 10/12/21  Yes Haviland, Julie, MD  Cholecalciferol (VITAMIN D) 2000 units tablet Take 2,000 Units by mouth daily.   Yes [provider]  fluticasone (FLONASE) 50 MCG/ACT nasal spray Place 1 spray into both nostrils as needed. 03/14/21  Yes [provider]  furosemide (LASIX) 20 MG tablet TAKE 1 TABLET BY MOUTH EVERY DAY 05/04/21  Yes Conte, Tessa N, PA-C  gabapentin (NEURONTIN) 100 MG capsule Take 100-200 mg by mouth at bedtime. 06/22/20  Yes [provider]  hydrocortisone (ANUSOL-HC) 2.5 % rectal cream   Place 1 Application rectally 2 (two) times daily. 10/12/21  Yes Haviland, Julie, MD  ketorolac (ACULAR) 0.5 % ophthalmic solution Place into both eyes daily. 11/02/20  Yes [provider]  latanoprost (XALATAN) 0.005 % ophthalmic solution SMARTSIG:1 Drop(s) In Eye(s) Every Evening 01/09/22  Yes [provider]  liver oil-zinc oxide (DESITIN) 40 % ointment Apply topically as needed for irritation.  09/13/20  Yes Vann, Jessica U, DO  metoprolol tartrate (LOPRESSOR) 25 MG tablet TAKE 1 TABLET BY MOUTH TWICE A DAY 09/22/21  Yes Cooper, Michael, MD  omeprazole (PRILOSEC) 40 MG capsule Take 1 capsule (40 mg total) by mouth daily. 09/23/20  Yes Walker, Caitlin S, NP  Rivaroxaban (XARELTO) 15 MG TABS tablet Take 1 tablet (15 mg total) by mouth daily with supper. 07/09/20  Yes Roberts, Lindsay B, NP  traMADol (ULTRAM) 50 MG tablet Take 1 tablet (50 mg total) by mouth every 12 (twelve) hours as needed. 11/06/21  Yes Stanbery, Mary L, PA-C  albuterol (VENTOLIN HFA) 108 (90 Base) MCG/ACT inhaler Inhale 2 puffs into the lungs every 4 (four) hours as needed for wheezing or shortness of breath.    [provider]  FLOVENT HFA 110 MCG/ACT inhaler TAKE 2 PUFFS BY MOUTH TWICE A DAY 03/21/17   Bobbitt, Ralph Carter, MD  HYDROcodone-acetaminophen (NORCO/VICODIN) 5-325 MG tablet Take 1 tablet by mouth every 8 (eight) hours as needed for moderate pain or severe pain. 07/21/21   Williams, Megan E, NP   Current Facility-Administered Medications  Medication Dose Route Frequency Provider Last Rate Last Admin   0.9 %  sodium chloride infusion   Intravenous Continuous Maddisyn Hegwood H, DO       lactated ringers infusion   Intravenous Continuous Raidyn Breiner H, DO 10 mL/hr at 07/23/22 1918 New Bag at 07/23/22 1918   Allergies as of 07/23/2022 - Review Complete 07/23/2022  Allergen Reaction Noted   Ferumoxytol Itching and Other (See Comments) 07/06/2020   Chocolate Other (See Comments) and Cough 09/07/2016   Cocoa Other (See Comments) and Cough 09/07/2016   Lisinopril Other (See Comments) and Cough 11/04/2015   Peanut oil Cough 01/07/2015   Family History  Problem Relation Age of Onset   Heart attack Mother        60s   Emphysema Brother    Allergic rhinitis Neg Hx    Angioedema Neg Hx    Asthma Neg Hx    Eczema Neg Hx    Immunodeficiency Neg Hx    Urticaria Neg Hx    Social History    Socioeconomic History   Marital status: Widowed    Spouse name: Not on file   Number of children: Not on file   Years of education: Not on file   Highest education level: Not on file  Occupational History   Not on file  Tobacco Use   Smoking status: Never   Smokeless tobacco: Current    Types: Snuff  Vaping Use   Vaping Use: Never used  Substance and Sexual Activity   Alcohol use: Not Currently    Comment: 01/06/2015 "I'll have a beer q once in awhile"   Drug use: No   Sexual activity: Never  Other Topics Concern   Not on file  Social History Narrative   Moved from the [place] area to [place] ~ 3yrs ago to live with her daughter   Social Determinants of Health   Financial Resource Strain: Not on file  Food Insecurity: Not on file  Transportation Needs:   Not on file  Physical Activity: Not on file  Stress: Not on file  Social Connections: Not on file  Intimate Partner Violence: Not on file   Review of Systems:  Review of Systems  Respiratory:  Positive for shortness of breath.   Cardiovascular:  Positive for chest pain.  Gastrointestinal:  Positive for nausea and vomiting. Negative for abdominal pain, blood in stool, constipation and diarrhea.    OBJECTIVE:   Temp:  [97.6 F (36.4 C)-98.4 F (36.9 C)] 97.6 F (36.4 C) (06/16 1909) Pulse Rate:  [65-68] 68 (06/16 1909) Resp:  [16-18] 16 (06/16 1909) BP: (136)/(65-84) 136/65 (06/16 1909) SpO2:  [100 %] 100 % (06/16 1909) Weight:  [64.4 kg] 64.4 kg (06/16 1909)   Physical Exam Constitutional:      General: She is not in acute distress.    Appearance: She is not ill-appearing, toxic-appearing or diaphoretic.  Cardiovascular:     Rate and Rhythm: Normal rate. Rhythm irregular.  Pulmonary:     Comments: Some diminished breath sounds in right anterior chest wall. Abdominal:     General: Bowel sounds are normal. There is no distension.     Palpations: Abdomen is soft.     Tenderness: There is no  abdominal tenderness. There is no guarding.  Musculoskeletal:     Right lower leg: No edema.     Left lower leg: No edema.  Skin:    General: Skin is warm and dry.  Neurological:     Mental Status: She is alert.     Labs: Recent Labs    07/23/22 1608  WBC 7.4  HGB 8.6*  HCT 28.0*  PLT 322   BMET Recent Labs    07/23/22 1608  NA 136  K 3.9  CL 102  CO2 24  GLUCOSE 100*  BUN 16  CREATININE 0.84  CALCIUM 9.2   LFT Recent Labs    07/23/22 1608  PROT 7.9  ALBUMIN 3.9  AST 22  ALT 8  ALKPHOS 49  BILITOT 0.8   PT/INR No results for input(s): "LABPROT", "INR" in the last 72 hours.  Diagnostic imaging: DG Chest Portable 1 View  Result Date: 07/23/2022 CLINICAL DATA:  cp EXAM: PORTABLE CHEST - 1 VIEW COMPARISON:  02/15/2022 FINDINGS: There are prominent bibasilar interstitial markings left greater than right. Stable cardiomegaly.  Aortic Atherosclerosis (ICD10-170.0). Blunting of lateral costophrenic angles. Surgical clips left axilla. IMPRESSION: Stable cardiomegaly with bibasilar interstitial opacities, left greater than right. Electronically Signed   By: D  Hassell M.D.   On: 07/23/2022 16:22    IMPRESSION: Atypical chest pain Dysphagia Concern for food impaction Atrial fibrillation, on Xarelto, last dose 07/22/22 PM History chronic anemia  PLAN: -Recommend EGD to evaluate possible food impaction, discussed procedure with patient and her son (Tony) at bedside, discussed benefits, alternatives, risks including bleeding, infection, perforation, missed lesion, anesthesia, she verbalized understanding and elected to proceed -Will avoid dilatation during procedure given current anticoagulant use -Further recommendations to follow pending procedure   LOS: 0 days   Luke Rigsbee, DO [facility]    

## 2022-07-23 NOTE — Transfer of Care (Signed)
Immediate Anesthesia Transfer of Care Note  Patient: [name]  Procedure(s) Performed: ESOPHAGOGASTRODUODENOSCOPY (EGD) BIOPSY  Patient Location: PACU  Anesthesia Type:General  Level of Consciousness: sedated, patient cooperative, and responds to stimulation  Airway & Oxygen Therapy: Patient Spontanous Breathing and Patient connected to face mask oxygen  Post-op Assessment: Report given to RN and Post -op Vital signs reviewed and stable  Post vital signs: Reviewed and stable  Last Vitals:  Vitals Value Taken Time  BP 123/68 07/23/22 2034  Temp    Pulse    Resp 25 07/23/22 2036  SpO2    Vitals shown include unvalidated device data.  Last Pain:  Vitals:   07/23/22 1909  TempSrc: Temporal  PainSc:          Complications: No notable events documented.

## 2022-07-23 NOTE — Anesthesia Procedure Notes (Signed)
Procedure Name: Intubation Date/Time: 07/23/2022 8:05 PM  Performed by: Mal Amabile, MDPre-anesthesia Checklist: Patient identified, Emergency Drugs available, Suction available, Patient being monitored and Timeout performed Patient Re-evaluated:Patient Re-evaluated prior to induction Oxygen Delivery Method: Circle system utilized Preoxygenation: Pre-oxygenation with 100% oxygen Induction Type: IV induction and Rapid sequence Laryngoscope Size: Mac and 3 Grade View: Grade I Tube type: Oral Tube size: 7.0 mm Number of attempts: 1 Airway Equipment and Method: Stylet Placement Confirmation: ETT inserted through vocal cords under direct vision, breath sounds checked- equal and bilateral and positive ETCO2 Secured at: 21 cm Tube secured with: Tape Dental Injury: Teeth and Oropharynx as per pre-operative assessment

## 2022-07-23 NOTE — Anesthesia Preprocedure Evaluation (Addendum)
Anesthesia Evaluation  Patient identified by MRN, date of birth, ID band Patient awake    Reviewed: Allergy & Precautions, NPO status , Patient's Chart, lab work & pertinent test results, reviewed documented beta blocker date and time   Airway Mallampati: II  TM Distance: >3 FB Neck ROM: Full    Dental  (+) Edentulous Upper, Edentulous Lower   Pulmonary asthma , sleep apnea and Continuous Positive Airway Pressure Ventilation , pneumonia, resolved   Pulmonary exam normal breath sounds clear to auscultation       Cardiovascular hypertension, Pt. on medications and Pt. on home beta blockers + angina with exertion + Past MI and +CHF  Normal cardiovascular exam+ dysrhythmias Atrial Fibrillation  Rhythm:Irregular Rate:Normal  Echo 07/26/21 1. Left ventricular ejection fraction, by estimation, is 50 to 55%. The  left ventricle has low normal function. The left ventricle has no regional  wall motion abnormalities. Left ventricular diastolic parameters are  indeterminate.   2. Right ventricular systolic function is mildly reduced. The right  ventricular size is mildly enlarged. There is normal pulmonary artery  systolic pressure.   3. Left atrial size was moderately dilated.   4. Right atrial size was severely dilated.   5. The mitral valve is grossly normal. Trivial mitral valve  regurgitation.   6. Tricuspid valve regurgitation is severe.   7. The aortic valve is tricuspid. Aortic valve regurgitation is not  visualized.   8. Aortic no significant aortic root/ascending aortic aneurysm.   9. The inferior vena cava is normal in size with greater than 50%  respiratory variability, suggesting right atrial pressure of 3 mmHg.   EKG 07/23/22 Atrial fibrillation, borderline low voltage in extremity leads, probable old anteroseptal MI  Cardiac Cath 07/07/20  Mid RCA lesion is 25% stenosed.  LV end diastolic pressure is normal.  There is no  aortic valve stenosis.   Nonobstructive CAD.  Continue medical therapy.      Neuro/Psych negative neurological ROS  negative psych ROS   GI/Hepatic Neg liver ROS,GERD  Medicated,,Food impaction   Endo/Other  diabetes, Well Controlled, Type 2, Oral Hypoglycemic Agents  Hyperlipidemia  Renal/GU Renal diseaseCKD  negative genitourinary   Musculoskeletal  (+) Arthritis , Osteoarthritis,    Abdominal   Peds  Hematology  (+) Blood dyscrasia, anemia Xarelto therapy- last dose 6/15   Anesthesia Other Findings   Reproductive/Obstetrics                              Anesthesia Physical Anesthesia Plan  ASA: 3 and emergent  Anesthesia Plan: General   Post-op Pain Management: Minimal or no pain anticipated   Induction: Intravenous, Rapid sequence and Cricoid pressure planned  PONV Risk Score and Plan: 3 and Treatment may vary due to age or medical condition and Ondansetron  Airway Management Planned: Oral ETT  Additional Equipment: None  Intra-op Plan:   Post-operative Plan: Extubation in OR  Informed Consent: I have reviewed the patients History and Physical, chart, labs and discussed the procedure including the risks, benefits and alternatives for the proposed anesthesia with the patient or authorized representative who has indicated his/her understanding and acceptance.       Plan Discussed with: CRNA and Anesthesiologist  Anesthesia Plan Comments:          Anesthesia Quick Evaluation

## 2022-07-23 NOTE — Discharge Instructions (Addendum)
Take pantoprazole 40 mg by mouth daily for 2 months.  Take sucralfate 1 gm by mouth before each meal and before bed time (dissolve the tablet in water so that the medicine will coat your esophagus).  Please contact [facility] to set up an appointment to see Dr. Dulce Sellar in the office.  Recommend that you have a repeat EGD scope in 2 months to ensure that the ulcers in your esophagus have healed.  Please avoid eating very hard foods (such as meats and breads), you may follow a soft diet.  Please resume your Xarelto tomorrow (07/24/22).

## 2022-07-23 NOTE — ED Notes (Signed)
Report called to [facility] ED

## 2022-07-23 NOTE — H&P (View-Only) (Signed)
[facility] Gastroenterology Consult  Referring Provider: No ref. provider found Primary Care Physician:  Estevan Oaks, NP Primary Gastroenterologist: [facility] (Dr. Dulce Sellar)  Reason for Consultation: dysphagia, possible food bolus  SUBJECTIVE:   HPI: [name] is a 89 y.o. female with past medical history significant for atrial fibrillation (on Xarelto, last dose 07/22/22 PM), intermittent anemia and dysphagia. Had dysphagia and followed up with Dr. Dulce Sellar in office on 08/23/20, subsequently had barium swallow evaluation on 08/27/20 which showed no mass/stricture/ulceration, barium tablet passed easily, small reducible hiatal hernia, mild induced reflux. She noted having EGD in the distant past > 10 years prior. Colonoscopy roughly 5 years prior, not recommended to have repeat secondary to age.   Noted having anterior chest wall discomfort that began around 0900/1000 on 07/22/22. She attempted to eat a [place] sandwich yesterday and had to regurgitate some of it. She denied prior history with swallowing meats, breads, pills. She denied abdominal pain. Had some shortness of breath on presentation to hospital, now improved. No constipation or diarrhea. No blood per rectum.   Labs on presentation showed Hgb 8.6 (was 9.7 on 02/15/22), PLT 322, WBC 7.4. CXR showed bilateral interstitial opacities, L>R.  Past Medical History:  Diagnosis Date   Arthritis    "all over"   Atrial fibrillation [facility])    Diagnosed ~2009   Breast cancer, left breast (HCC) 02/07/1996   S/P chemo and mastectomy    CHF (congestive heart failure) (HCC)    GERD (gastroesophageal reflux disease)    Hyperlipidemia    Hypertension    Left shoulder pain    "MRI showed pinched nerve" - per pt   MI (myocardial infarction) (HCC)    OSA on CPAP    Pneumonia    "4-5 times" (01/06/2015)   Type II diabetes mellitus (HCC)    Past Surgical History:  Procedure Laterality Date   BREAST BIOPSY Left 1998   CATARACT EXTRACTION  W/ INTRAOCULAR LENS  IMPLANT, BILATERAL Bilateral    JOINT REPLACEMENT     LAPAROSCOPIC CHOLECYSTECTOMY     LEFT HEART CATH AND CORONARY ANGIOGRAPHY N/A 07/07/2020   Procedure: LEFT HEART CATH AND CORONARY ANGIOGRAPHY;  Surgeon: Corky Crafts, MD;  Location: [facility] INVASIVE CV LAB;  Service: Cardiovascular;  Laterality: N/A;   MASTECTOMY Left 1998   TOTAL KNEE ARTHROPLASTY Left 2001   TOTAL KNEE REVISION Left 01/28/2013   Procedure: LEFT TOTAL KNEE ARTHROPLASTY REVISION;  Surgeon: Cheral Almas, MD;  Location: [facility] OR;  Service: Orthopedics;  Laterality: Left;   Prior to Admission medications   Medication Sig Start Date End Date Taking? Authorizing Provider  amLODipine (NORVASC) 5 MG tablet TAKE 1 TABLET (5 MG TOTAL) BY MOUTH DAILY. 06/29/22  Yes Tonny Bollman, MD  atorvastatin (LIPITOR) 40 MG tablet TAKE 1 TABLET BY MOUTH EVERY DAY 08/25/21  Yes Tonny Bollman, MD  celecoxib (CELEBREX) 200 MG capsule Take 1 capsule (200 mg total) by mouth 2 (two) times daily. 10/12/21  Yes Jacalyn Lefevre, MD  Cholecalciferol (VITAMIN D) 2000 units tablet Take 2,000 Units by mouth daily.   Yes [provider]  fluticasone (FLONASE) 50 MCG/ACT nasal spray Place 1 spray into both nostrils as needed. 03/14/21  Yes [provider]  furosemide (LASIX) 20 MG tablet TAKE 1 TABLET BY MOUTH EVERY DAY 05/04/21  Yes Asa Lente, Tessa N, PA-C  gabapentin (NEURONTIN) 100 MG capsule Take 100-200 mg by mouth at bedtime. 06/22/20  Yes [provider]  hydrocortisone (ANUSOL-HC) 2.5 % rectal cream  Place 1 Application rectally 2 (two) times daily. 10/12/21  Yes Jacalyn Lefevre, MD  ketorolac (ACULAR) 0.5 % ophthalmic solution Place into both eyes daily. 11/02/20  Yes [provider]  latanoprost (XALATAN) 0.005 % ophthalmic solution SMARTSIG:1 Drop(s) In Eye(s) Every Evening 01/09/22  Yes [provider]  liver oil-zinc oxide (DESITIN) 40 % ointment Apply topically as needed for irritation.  09/13/20  Yes Marlin Canary U, DO  metoprolol tartrate (LOPRESSOR) 25 MG tablet TAKE 1 TABLET BY MOUTH TWICE A DAY 09/22/21  Yes Tonny Bollman, MD  omeprazole (PRILOSEC) 40 MG capsule Take 1 capsule (40 mg total) by mouth daily. 09/23/20  Yes Alver Sorrow, NP  Rivaroxaban (XARELTO) 15 MG TABS tablet Take 1 tablet (15 mg total) by mouth daily with supper. 07/09/20  Yes Arty Baumgartner, NP  traMADol (ULTRAM) 50 MG tablet Take 1 tablet (50 mg total) by mouth every 12 (twelve) hours as needed. 11/06/21  Yes Cristie Hem, PA-C  albuterol (VENTOLIN HFA) 108 (90 Base) MCG/ACT inhaler Inhale 2 puffs into the lungs every 4 (four) hours as needed for wheezing or shortness of breath.    [provider]  FLOVENT HFA 110 MCG/ACT inhaler TAKE 2 PUFFS BY MOUTH TWICE A DAY 03/21/17   Bobbitt, Heywood Iles, MD  HYDROcodone-acetaminophen (NORCO/VICODIN) 5-325 MG tablet Take 1 tablet by mouth every 8 (eight) hours as needed for moderate pain or severe pain. 07/21/21   Juanda Chance, NP   Current Facility-Administered Medications  Medication Dose Route Frequency Provider Last Rate Last Admin   0.9 %  sodium chloride infusion   Intravenous Continuous Liliane Shi H, DO       lactated ringers infusion   Intravenous Continuous Lynann Bologna, DO 10 mL/hr at 07/23/22 1918 New Bag at 07/23/22 1918   Allergies as of 07/23/2022 - Review Complete 07/23/2022  Allergen Reaction Noted   Ferumoxytol Itching and Other (See Comments) 07/06/2020   Chocolate Other (See Comments) and Cough 09/07/2016   Cocoa Other (See Comments) and Cough 09/07/2016   Lisinopril Other (See Comments) and Cough 11/04/2015   Peanut oil Cough 01/07/2015   Family History  Problem Relation Age of Onset   Heart attack Mother        4s   Emphysema Brother    Allergic rhinitis Neg Hx    Angioedema Neg Hx    Asthma Neg Hx    Eczema Neg Hx    Immunodeficiency Neg Hx    Urticaria Neg Hx    Social History    Socioeconomic History   Marital status: Widowed    Spouse name: Not on file   Number of children: Not on file   Years of education: Not on file   Highest education level: Not on file  Occupational History   Not on file  Tobacco Use   Smoking status: Never   Smokeless tobacco: Current    Types: Snuff  Vaping Use   Vaping Use: Never used  Substance and Sexual Activity   Alcohol use: Not Currently    Comment: 01/06/2015 "I'll have a beer q once in awhile"   Drug use: No   Sexual activity: Never  Other Topics Concern   Not on file  Social History Narrative   Moved from the [place] area to [place] ~ 44yrs ago to live with her daughter   Social Determinants of Health   Financial Resource Strain: Not on file  Food Insecurity: Not on file  Transportation Needs:  Not on file  Physical Activity: Not on file  Stress: Not on file  Social Connections: Not on file  Intimate Partner Violence: Not on file   Review of Systems:  Review of Systems  Respiratory:  Positive for shortness of breath.   Cardiovascular:  Positive for chest pain.  Gastrointestinal:  Positive for nausea and vomiting. Negative for abdominal pain, blood in stool, constipation and diarrhea.    OBJECTIVE:   Temp:  [97.6 F (36.4 C)-98.4 F (36.9 C)] 97.6 F (36.4 C) (06/16 1909) Pulse Rate:  [65-68] 68 (06/16 1909) Resp:  [16-18] 16 (06/16 1909) BP: (136)/(65-84) 136/65 (06/16 1909) SpO2:  [100 %] 100 % (06/16 1909) Weight:  [64.4 kg] 64.4 kg (06/16 1909)   Physical Exam Constitutional:      General: She is not in acute distress.    Appearance: She is not ill-appearing, toxic-appearing or diaphoretic.  Cardiovascular:     Rate and Rhythm: Normal rate. Rhythm irregular.  Pulmonary:     Comments: Some diminished breath sounds in right anterior chest wall. Abdominal:     General: Bowel sounds are normal. There is no distension.     Palpations: Abdomen is soft.     Tenderness: There is no  abdominal tenderness. There is no guarding.  Musculoskeletal:     Right lower leg: No edema.     Left lower leg: No edema.  Skin:    General: Skin is warm and dry.  Neurological:     Mental Status: She is alert.     Labs: Recent Labs    07/23/22 1608  WBC 7.4  HGB 8.6*  HCT 28.0*  PLT 322   BMET Recent Labs    07/23/22 1608  NA 136  K 3.9  CL 102  CO2 24  GLUCOSE 100*  BUN 16  CREATININE 0.84  CALCIUM 9.2   LFT Recent Labs    07/23/22 1608  PROT 7.9  ALBUMIN 3.9  AST 22  ALT 8  ALKPHOS 49  BILITOT 0.8   PT/INR No results for input(s): "LABPROT", "INR" in the last 72 hours.  Diagnostic imaging: DG Chest Portable 1 View  Result Date: 07/23/2022 CLINICAL DATA:  cp EXAM: PORTABLE CHEST - 1 VIEW COMPARISON:  02/15/2022 FINDINGS: There are prominent bibasilar interstitial markings left greater than right. Stable cardiomegaly.  Aortic Atherosclerosis (ICD10-170.0). Blunting of lateral costophrenic angles. Surgical clips left axilla. IMPRESSION: Stable cardiomegaly with bibasilar interstitial opacities, left greater than right. Electronically Signed   By: Corlis Leak M.D.   On: 07/23/2022 16:22    IMPRESSION: Atypical chest pain Dysphagia Concern for food impaction Atrial fibrillation, on Xarelto, last dose 07/22/22 PM History chronic anemia  PLAN: -Recommend EGD to evaluate possible food impaction, discussed procedure with patient and her son Alinda Money) at bedside, discussed benefits, alternatives, risks including bleeding, infection, perforation, missed lesion, anesthesia, she verbalized understanding and elected to proceed -Will avoid dilatation during procedure given current anticoagulant use -Further recommendations to follow pending procedure   LOS: 0 days   Liliane Shi, [facility]

## 2022-07-23 NOTE — ED Notes (Signed)
ED Provider at bedside. 

## 2022-07-23 NOTE — ED Provider Notes (Addendum)
[place] [facility]ERGENCY DEPARTMENT AT [facility] Provider Note   CSN: [id number] Arrival date & time: 07/23/22  1539     History  Chief Complaint  Patient presents with   Chest Pain    [name] is a 89 y.o. female.  Patient here with chest pain, had an episode of having a hard time drinking fluids this morning.  Ate some baked chicken last night.  Was able to drink last night without any issues.  Did not have any other solid food after that and had a little bit of spitting up of her liquids and medicines this morning.  She has a history of hypertension, A-fib on Xarelto, diabetes, CHF.  Denies any weakness or numbness or chills.  She has not tried to eat any solid food today.  No abdominal pain nausea vomiting diarrhea otherwise.  The history is provided by the patient.       Home Medications Prior to Admission medications   Medication Sig Start Date End Date Taking? Authorizing Provider  albuterol (VENTOLIN HFA) 108 (90 Base) MCG/ACT inhaler Inhale 2 puffs into the lungs every 4 (four) hours as needed for wheezing or shortness of breath.    [provider]  amLODipine (NORVASC) 5 MG tablet TAKE 1 TABLET (5 MG TOTAL) BY MOUTH DAILY. 06/29/22   Tonny Bollman, MD  atorvastatin (LIPITOR) 40 MG tablet TAKE 1 TABLET BY MOUTH EVERY DAY 08/25/21   Tonny Bollman, MD  celecoxib (CELEBREX) 200 MG capsule Take 1 capsule (200 mg total) by mouth 2 (two) times daily. Patient not taking: Reported on 02/08/2022 10/12/21   Jacalyn Lefevre, MD  Cholecalciferol (VITAMIN D) 2000 units tablet Take 2,000 Units by mouth daily.    [provider]  FLOVENT HFA 110 MCG/ACT inhaler TAKE 2 PUFFS BY MOUTH TWICE A DAY 03/21/17   Bobbitt, Heywood Iles, MD  fluticasone [facility]) 50 MCG/ACT nasal spray Place 1 spray into both nostrils as needed. 03/14/21   [provider]  furosemide (LASIX) 20 MG tablet TAKE 1 TABLET BY MOUTH EVERY DAY 05/04/21   Sharlene Dory, PA-C   gabapentin (NEURONTIN) 100 MG capsule Take 100-200 mg by mouth at bedtime. 06/22/20   [provider]  HYDROcodone-acetaminophen (NORCO/VICODIN) 5-325 MG tablet Take 1 tablet by mouth every 8 (eight) hours as needed for moderate pain or severe pain. 07/21/21   Juanda Chance, NP  hydrocortisone (ANUSOL-HC) 2.5 % rectal cream Place 1 Application rectally 2 (two) times daily. Patient not taking: Reported on 02/08/2022 10/12/21   Jacalyn Lefevre, MD  ketorolac (ACULAR) 0.5 % ophthalmic solution Place into both eyes daily. 11/02/20   [provider]  latanoprost (XALATAN) 0.005 % ophthalmic solution SMARTSIG:1 Drop(s) In Eye(s) Every Evening 01/09/22   [provider]  liver oil-zinc oxide (DESITIN) 40 % ointment Apply topically as needed for irritation. 09/13/20   Joseph Art, DO  metoprolol tartrate (LOPRESSOR) 25 MG tablet TAKE 1 TABLET BY MOUTH TWICE A DAY 09/22/21   Tonny Bollman, MD  omeprazole (PRILOSEC) 40 MG capsule Take 1 capsule (40 mg total) by mouth daily. 09/23/20   Alver Sorrow, NP  Rivaroxaban (XARELTO) 15 MG TABS tablet Take 1 tablet (15 mg total) by mouth daily with supper. 07/09/20   Arty Baumgartner, NP  traMADol (ULTRAM) 50 MG tablet Take 1 tablet (50 mg total) by mouth every 12 (twelve) hours as needed. 11/06/21   Cristie Hem, PA-C      Allergies  Ferumoxytol, Chocolate, Lisinopril, and Peanuts [peanut oil]    Review of Systems   Review of Systems  Physical Exam Updated Vital Signs BP 136/84 (BP Location: Left Arm)   Pulse 65   Temp 98.4 F (36.9 C) (Oral)   Resp 18   Ht 5\' 4"  (1.626 m)   Wt 64.4 kg   SpO2 100%   BMI 24.37 kg/m  Physical Exam Vitals and nursing note reviewed.  Constitutional:      General: She is not in acute distress.    Appearance: She is well-developed. She is not ill-appearing.  HENT:     Head: Normocephalic and atraumatic.  Eyes:     Extraocular Movements: Extraocular movements intact.      Conjunctiva/sclera: Conjunctivae normal.     Pupils: Pupils are equal, round, and reactive to light.  Cardiovascular:     Rate and Rhythm: Normal rate and regular rhythm.     Pulses:          Carotid pulses are 2+ on the right side and 2+ on the left side.    Heart sounds: Normal heart sounds. No murmur heard. Pulmonary:     Effort: Pulmonary effort is normal. No respiratory distress.     Breath sounds: Normal breath sounds.  Abdominal:     Palpations: Abdomen is soft.     Tenderness: There is no abdominal tenderness.  Musculoskeletal:        General: No swelling. Normal range of motion.     Cervical back: Normal range of motion and neck supple.     Right lower leg: No edema.     Left lower leg: No edema.  Skin:    General: Skin is warm and dry.     Capillary Refill: Capillary refill takes less than 2 seconds.  Neurological:     General: No focal deficit present.     Mental Status: She is alert and oriented to person, place, and time.     Cranial Nerves: No cranial nerve deficit.     Motor: No weakness.     Comments: 5+ out of 5 strength throughout, normal sensation, no drift, normal finger-nose-finger, normal speech  Psychiatric:        Mood and Affect: Mood normal.     ED Results / Procedures / Treatments   Labs (all labs ordered are listed, but only abnormal results are displayed) Labs Reviewed  CBC WITH DIFFERENTIAL/PLATELET - Abnormal; Notable for the following components:      Result Value   RBC 3.41 (*)    Hemoglobin 8.6 (*)    HCT 28.0 (*)    MCH 25.2 (*)    RDW 17.2 (*)    All other components within normal limits  COMPREHENSIVE METABOLIC PANEL - Abnormal; Notable for the following components:   Glucose, Bld 100 (*)    All other components within normal limits  TROPONIN I (HIGH SENSITIVITY)    EKG EKG Interpretation  Date/Time:  Sunday July 23 2022 15:53:10 EDT Ventricular Rate:  100 PR Interval:    QRS Duration: 90 QT Interval:  379 QTC  Calculation: 489 R Axis:   100 Text Interpretation: Atrial fibrillation Ventricular premature complex Right axis deviation Borderline low voltage, extremity leads Probable anteroseptal infarct, old Confirmed by Lockie Mola, Taye Cato [phone]) on 07/23/2022 3:54:41 PM  Radiology DG Chest Portable 1 View  Result Date: 07/23/2022 CLINICAL DATA:  cp EXAM: PORTABLE CHEST - 1 VIEW COMPARISON:  02/15/2022 FINDINGS: There are prominent bibasilar interstitial markings left greater than  right. Stable cardiomegaly.  Aortic Atherosclerosis (ICD10-170.0). Blunting of lateral costophrenic angles. Surgical clips left axilla. IMPRESSION: Stable cardiomegaly with bibasilar interstitial opacities, left greater than right. Electronically Signed   By: Corlis Leak M.D.   On: 07/23/2022 16:22    Procedures Procedures    Medications Ordered in ED Medications  alum & mag hydroxide-simeth (MAALOX/MYLANTA) 200-200-20 MG/5ML suspension 30 mL (30 mLs Oral Given 07/23/22 1611)  glucagon (human recombinant) (GLUCAGEN) injection 1 mg (1 mg Intravenous Given 07/23/22 1650)    ED Course/ Medical Decision Making/ A&P                             Medical Decision Making Amount and/or Complexity of Data Reviewed Labs: ordered. Radiology: ordered.  Risk OTC drugs. Prescription drug management.   [name] is here with chest pain.  Normal vitals.  No fever.  History of A-fib on Xarelto, hypertension, high cholesterol, CHF.  She denies any shortness of breath.  No signs of volume overload on exam.  She had a heart cath a few years back that was unremarkable.  She describes mostly GI symptoms.  Not a great story for food impaction.  She ate some solid food yesterday afternoon.  Was able to drink water after just fine.  Did not eat any other solid food last night or this morning but she had a little difficulty keeping down water and her medications this morning.  She denies any abdominal pain nausea or vomiting otherwise.  She  appears well.  Differential likely GI related symptoms but will evaluate for ACS, pneumonia, electrolyte abnormality, AKI.  Will give GI cocktail and have her drink p.o. here and see how she does.  She has no weakness or numbness.  Have no concern for stroke or neurologic process.  I was watching in the room when she tried to drink some fluids and she cannot pass liquids.  I really tried to get her to focus in on what she ate the last day or 2.  Eventually she did say that now that she thought a bit more she did have a pork chop on Friday.  Did not have much to eat or drink after that.  Try to eat some sausage and biscuit gravy yesterday morning and that is when she really started to notice difficulty with swallowing.  Was unable to finish that meal.  She did not really try to eat or drink much rest of the day.  She noticed some issues trying to drink water last night.  And then it continued this morning when she was unable to really take her morning pills.  Ultimately I do think that she has a food impaction or at least needs evaluation for this by GI.  Will give a dose of glucagon.  Lab work per my review and interpretation is unremarkable.  I do not have any concern for cardiac or pulmonary process.  Will talk with GI.  Talked with Dr. Lorenso Quarry with [facility] gastro[facility]ology.  Overall in agreement that she likely needs to upper endoscopy to rule out food impaction.  She is clinically stable.  I think it is okay for her to go private vehicle with her family member who feels good taken her over to [employer].  Please contact [facility] when patient arrives.  Patient told not to eat or drink anything. Dr. Silverio Lay, Dr. Renaye Rakers from [facility] ED aware.  This chart was dictated using voice recognition  software.  Despite best efforts to proofread,  errors can occur which can change the documentation meaning.         Final Clinical Impression(s) / ED Diagnoses Final diagnoses:  Food impaction of esophagus, initial  encounter    Rx / DC Orders ED Discharge Orders     None         Virgina Norfolk, DO 07/23/22 1711    Virgina Norfolk, DO 07/23/22 1713

## 2022-07-23 NOTE — ED Notes (Signed)
XR at bedside

## 2022-07-23 NOTE — Op Note (Addendum)
[facility] Patient Name: [name] Procedure Date: 07/23/2022 MRN: [id number] Attending MD: Liliane Shi DO, DO, [id number] Date of Birth: 14-Aug-1933 CSN: [id number] Age: 89 Admit Type: Inpatient Procedure:                Upper GI endoscopy Indications:              Unexplained chest pain, Globus sensation Providers:                Liliane Shi DO, DO, Lorenza Evangelist, RN,                            Sunday Corn Mbumina, Technician Referring MD:              Medicines:                See the Anesthesia note for documentation of the                            administered medications Complications:            No immediate complications. Estimated Blood Loss:     Estimated blood loss was minimal. Procedure:                Pre-Anesthesia Assessment:                           - ASA Grade Assessment: II - A patient with mild                            systemic disease.                           - The risks and benefits of the procedure and the                            sedation options and risks were discussed with the                            patient. All questions were answered and informed                            consent was obtained.                           After obtaining informed consent, the endoscope was                            passed under direct vision. Throughout the                            procedure, the patient's blood pressure, pulse, and                            oxygen saturations were monitored continuously. The                            [id number]-[id number] ([id number]) Olympus endoscope was  introduced                            through the mouth, and advanced to the second part                            of duodenum. The upper GI endoscopy was                            accomplished without difficulty. The patient                            tolerated the procedure well. Scope In: Scope Out: Findings:      There is no endoscopic evidence of  foreign body in the entire esophagus.      Ulcerative esophagitis with bleeding after passage of gastroscope was       found 38 to 40 cm from the incisors. Biopsies were obtained from the       proximal and distal esophagus with cold forceps for histology of       suspected eosinophilic esophagitis.      Scattered mild inflammation characterized by erythema was found in the       gastric body. Biopsies were taken with a cold forceps for Helicobacter       pylori testing.      The examined duodenum was normal. Impression:               - Ulcerative esophagitis with bleeding after                            passage of gastroscope.                           - Gastritis. Biopsied.                           - Normal examined duodenum.                           - Biopsies were taken with a cold forceps for                            evaluation of eosinophilic esophagitis. Moderate Sedation:      See the other procedure note for documentation of moderate sedation with       intraservice time. Recommendation:           - Discharge patient to home.                           - Await pathology results.                           - Use Protonix (pantoprazole) 40 mg PO daily for 2                            months.                           -  Use sucralfate suspension 1 gram PO QID for 1                            week.                           - Resume Xarelto (rivaroxaban) at prior dose                            tomorrow.                           - Repeat upper endoscopy in 2 months for                            surveillance.                           - Return to [facility] (Dr. Dulce Sellar) in 1 month. Procedure Code(s):        --- Professional ---                           [phone], Esophagogastroduodenoscopy, flexible,                            transoral; with biopsy, single or multiple Diagnosis Code(s):        --- Professional ---                           K20.90, Esophagitis, unspecified  without bleeding                           K29.70, Gastritis, unspecified, without bleeding                           R07.9, Chest pain, unspecified                           F45.8, Other somatoform disorders CPT copyright 2022 [employer]. All rights reserved. The codes documented in this report are preliminary and upon coder review may  be revised to meet current compliance requirements. Dr Liliane Shi, DO Liliane Shi DO, DO 07/23/2022 8:28:40 PM Number of Addenda: 0

## 2022-07-23 NOTE — Anesthesia Postprocedure Evaluation (Signed)
Anesthesia Post Note  Patient: [name]  Procedure(s) Performed: ESOPHAGOGASTRODUODENOSCOPY (EGD) BIOPSY     Patient location during evaluation: PACU Anesthesia Type: General Level of consciousness: awake and alert and oriented Pain management: pain level controlled Vital Signs Assessment: post-procedure vital signs reviewed and stable Respiratory status: spontaneous breathing, nonlabored ventilation and respiratory function stable Cardiovascular status: blood pressure returned to baseline and stable Postop Assessment: no apparent nausea or vomiting Anesthetic complications: no   No notable events documented.  Last Vitals:  Vitals:   07/23/22 2100 07/23/22 2108  BP: 132/77 (!) 141/75  Pulse: 73   Resp: 13 19  Temp: 36.4 C   SpO2: 100%     Last Pain:  Vitals:   07/23/22 2100  TempSrc:   PainSc: 0-No pain                 Quantavius Humm A.

## 2022-07-26 ENCOUNTER — Encounter (HOSPITAL_COMMUNITY): Payer: Self-pay | Admitting: Internal Medicine

## 2022-07-26 LAB — SURGICAL PATHOLOGY

## 2022-09-07 ENCOUNTER — Ambulatory Visit (INDEPENDENT_AMBULATORY_CARE_PROVIDER_SITE_OTHER): Payer: 59 | Admitting: Physician Assistant

## 2022-09-07 DIAGNOSIS — M19071 Primary osteoarthritis, right ankle and foot: Secondary | ICD-10-CM

## 2022-09-07 MED ORDER — BUPIVACAINE HCL 0.25 % IJ SOLN
0.6600 mL | INTRAMUSCULAR | Status: AC | PRN
Start: 2022-09-07 — End: 2022-09-07
  Administered 2022-09-07: .66 mL via INTRA_ARTICULAR

## 2022-09-07 MED ORDER — LIDOCAINE HCL 1 % IJ SOLN
1.0000 mL | INTRAMUSCULAR | Status: AC | PRN
Start: 2022-09-07 — End: 2022-09-07
  Administered 2022-09-07: 1 mL

## 2022-09-07 MED ORDER — METHYLPREDNISOLONE ACETATE 40 MG/ML IJ SUSP
13.3300 mg | INTRAMUSCULAR | Status: AC | PRN
Start: 2022-09-07 — End: 2022-09-07
  Administered 2022-09-07: 13.33 mg via INTRA_ARTICULAR

## 2022-09-07 NOTE — Progress Notes (Signed)
Office Visit Note   Patient: [name]           Date of Birth: Apr 27, 1933           MRN: [id number] Visit Date: 09/07/2022              Requested by: Estevan Oaks, NP [address] [place],  [place] [address] PCP: Estevan Oaks, NP   Assessment & Plan: Visit Diagnoses:  1. Arthritis of right ankle     Plan: Impression is right ankle arthritis.  Today, we discussed repeat cortisone injection for which she is agreeable to.  She tolerated this well.  Follow-up as needed.  Follow-Up Instructions: Return if symptoms worsen or fail to improve.   Orders:  Orders Placed This Encounter  Procedures   Medium Joint Inj: L ankle   No orders of the defined types were placed in this encounter.     Procedures: Medium Joint Inj: L ankle on 09/07/2022 8:58 AM Indications: pain Medications: 1 mL lidocaine 1 %; 0.66 mL bupivacaine 0.25 %; 13.33 mg methylPREDNISolone acetate 40 MG/ML      Clinical Data: No additional findings.   Subjective: Chief Complaint  Patient presents with   Left Ankle - Pain   Right Ankle - Pain    HPI patient is a pleasant 89 year old female who comes in today with right ankle pain.  History of osteoarthritis.  She was last seen in our office in September 2021 where right ankle was injected with cortisone.  She had good relief for about 3 months.  She is requesting a repeat injection today.  Currently she is having constant pain worse with walking.  Tylenol provides minimal relief.  Review of Systems as detailed in HPI.  All others reviewed and are negative.   Objective: Vital Signs: There were no vitals taken for this visit.  Physical Exam well-developed well-nourished female no acute distress.  Alert and oriented x 3.  Ortho Exam right ankle exam shows mild swelling.  Slight limited range of motion secondary to pain.  Diffuse tenderness.  She is neurovascularly intact distally.  Specialty Comments:  EXAM: MRI CERVICAL  SPINE WITHOUT CONTRAST   TECHNIQUE: Multiplanar, multisequence MR imaging of the cervical spine was performed. No intravenous contrast was administered.   COMPARISON:  None Available.   FINDINGS: Alignment: Grade 1 anterolisthesis at C5-6   Vertebrae: No fracture, evidence of discitis, or bone lesion.   Cord: Normal signal and morphology.   Posterior Fossa, vertebral arteries, paraspinal tissues: Negative.   Disc levels:   C1-2: Unremarkable.   C2-3: Small disc bulge with bilateral uncovertebral hypertrophy. No spinal canal stenosis. Moderate bilateral neural foraminal stenosis.   C3-4: Medium-sized disc bulge with large uncovertebral osteophytes. Severe spinal canal stenosis. Severe bilateral neural foraminal stenosis.   C4-5: Small disc bulge with uncovertebral hypertrophy. Moderate spinal canal stenosis. Moderate right and mild left neural foraminal stenosis.   C5-6: Small disc bulge with moderate bilateral uncovertebral hypertrophy. There is no spinal canal stenosis. Moderate bilateral neural foraminal stenosis.   C6-7: Small disc bulge. There is no spinal canal stenosis. Mild left neural foraminal stenosis.   C7-T1: Medium-sized disc bulge with uncovertebral hypertrophy. Mild spinal canal stenosis. Severe bilateral neural foraminal stenosis.   IMPRESSION: 1. Multilevel cervical degenerative disc disease with severe spinal canal stenosis at C3-4 and moderate spinal canal stenosis at C4-5. 2. Multilevel moderate to severe neural foraminal stenosis, worst at C3-4 and C7-T1.  Electronically Signed   By: Deatra Robinson M.D.   On: 06/22/2021 00:11  Imaging: No results found.   PMFS History: Patient Active Problem List   Diagnosis Date Noted   CHF exacerbation (HCC) 09/12/2020   Hypomagnesemia 09/12/2020   Acute on chronic diastolic CHF (congestive heart failure) (HCC) 09/11/2020   OSA on CPAP    CKD (chronic kidney disease), stage III (HCC)    Anemia  of chronic disease    Hypokalemia    Malnutrition of moderate degree 07/09/2020   Chest pain of uncertain etiology    Unstable angina (HCC) 07/03/2020   Primary osteoarthritis, right ankle and foot 01/24/2018   Arthritis of right ankle 08/21/2017   Allergic urticaria 09/07/2016   Generalized pruritus 09/07/2016   Moderate persistent asthma 09/07/2016   Perennial and seasonal allergic rhinitis 09/07/2016   Chest pain 01/06/2015   Angina at rest 01/06/2015   Diabetes (HCC) 01/06/2015   Persistent atrial fibrillation (HCC) 01/06/2015   HLD (hyperlipidemia) 01/06/2015   Tobacco abuse 01/06/2015   Type II diabetes mellitus (HCC)    Painful total knee replacement (HCC) 01/28/2013   Atrial fibrillation (HCC)    Left shoulder pain    Hypertension    Past Medical History:  Diagnosis Date   Arthritis    "all over"   Atrial fibrillation (HCC)    Diagnosed ~2009   Breast cancer, left breast (HCC) 02/07/1996   S/P chemo and mastectomy    CHF (congestive heart failure) (HCC)    GERD (gastroesophageal reflux disease)    Hyperlipidemia    Hypertension    Left shoulder pain    "MRI showed pinched nerve" - per pt   MI (myocardial infarction) (HCC)    OSA on CPAP    Pneumonia    "4-5 times" (01/06/2015)   Type II diabetes mellitus (HCC)     Family History  Problem Relation Age of Onset   Heart attack Mother        69s   Emphysema Brother    Allergic rhinitis Neg Hx    Angioedema Neg Hx    Asthma Neg Hx    Eczema Neg Hx    Immunodeficiency Neg Hx    Urticaria Neg Hx     Past Surgical History:  Procedure Laterality Date   BIOPSY  07/23/2022   Procedure: BIOPSY;  Surgeon: Lynann Bologna, DO;  Location: [facility];  Service: Gastroenterology;;   BREAST BIOPSY Left 1998   CATARACT EXTRACTION W/ INTRAOCULAR LENS  IMPLANT, BILATERAL Bilateral    ESOPHAGOGASTRODUODENOSCOPY N/A 07/23/2022   Procedure: ESOPHAGOGASTRODUODENOSCOPY (EGD);  Surgeon: Lynann Bologna, DO;  Location:  [facility];  Service: Gastroenterology;  Laterality: N/A;   JOINT REPLACEMENT     LAPAROSCOPIC CHOLECYSTECTOMY     LEFT HEART CATH AND CORONARY ANGIOGRAPHY N/A 07/07/2020   Procedure: LEFT HEART CATH AND CORONARY ANGIOGRAPHY;  Surgeon: Corky Crafts, MD;  Location: [facility] INVASIVE CV LAB;  Service: Cardiovascular;  Laterality: N/A;   MASTECTOMY Left 1998   TOTAL KNEE ARTHROPLASTY Left 2001   TOTAL KNEE REVISION Left 01/28/2013   Procedure: LEFT TOTAL KNEE ARTHROPLASTY REVISION;  Surgeon: Cheral Almas, MD;  Location: [facility] OR;  Service: Orthopedics;  Laterality: Left;   Social History   Occupational History   Not on file  Tobacco Use   Smoking status: Never   Smokeless tobacco: Current    Types: Snuff  Vaping Use   Vaping status: Never Used  Substance and Sexual Activity   Alcohol  use: Not Currently    Comment: 01/06/2015 "I'll have a beer q once in awhile"   Drug use: No   Sexual activity: Never

## 2022-10-12 ENCOUNTER — Encounter: Payer: Self-pay | Admitting: Cardiovascular Disease

## 2022-10-12 ENCOUNTER — Ambulatory Visit: Payer: 59 | Attending: Cardiovascular Disease | Admitting: Cardiovascular Disease

## 2022-10-12 VITALS — BP 108/60 | HR 85 | Ht 64.0 in | Wt 142.6 lb

## 2022-10-12 DIAGNOSIS — I5022 Chronic systolic (congestive) heart failure: Secondary | ICD-10-CM | POA: Diagnosis not present

## 2022-10-12 DIAGNOSIS — E785 Hyperlipidemia, unspecified: Secondary | ICD-10-CM | POA: Diagnosis not present

## 2022-10-12 DIAGNOSIS — I4819 Other persistent atrial fibrillation: Secondary | ICD-10-CM

## 2022-10-12 NOTE — Patient Instructions (Signed)
Medication Instructions:  Your physician recommends that you continue on your current medications as directed. Please refer to the Current Medication list given to you today.  *If you need a refill on your cardiac medications before your next appointment, please call your pharmacy*   Lab Work: NONE If you have labs (blood work) drawn today and your tests are completely normal, you will receive your results only by: MyChart Message (if you have MyChart) OR A paper copy in the mail If you have any lab test that is abnormal or we need to change your treatment, we will call you to review the results.   Testing/Procedures: ECHO Your physician has requested that you have an echocardiogram. Echocardiography is a painless test that uses sound waves to create images of your heart. It provides your doctor with information about the size and shape of your heart and how well your heart's chambers and valves are working. This procedure takes approximately one hour. There are no restrictions for this procedure. Please do NOT wear cologne, perfume, aftershave, or lotions (deodorant is allowed). Please arrive 15 minutes prior to your appointment time.  Follow-Up: At [facility], you and your health needs are our priority.  As part of our continuing mission to provide you with exceptional heart care, we have created designated Provider Care Teams.  These Care Teams include your primary Cardiologist (physician) and Advanced Practice Providers (APPs -  Physician Assistants and Nurse Practitioners) who all work together to provide you with the care you need, when you need it.  Your next appointment:   1 year(s)  Provider:   Timber Cooper, MD     

## 2022-10-12 NOTE — Progress Notes (Signed)
Cardiology Office Note:    Date:  10/12/2022   ID:  [name], DOB 1933/03/11, MRN [id number]  PCP:  Estevan Oaks, NP   [facility] Providers Cardiologist:  Tonny Bollman, MD     Referring MD: Estevan Oaks, NP   Chief Complaint  Patient presents with   Atrial Fibrillation    History of Present Illness:    [name] is a 89 y.o. female with a hx of permanent atrial fibrillation, nonobstructive CAD, and hypertension, presenting for follow-up evaluation. The patient underwent cardiac catheterization in 2022 when she presented with non-STEMI. She was found to have nonobstructive coronary artery disease with recommendation for medical therapy. LVEF was normal at 55 to 60%.   The patient is here alone today.  She has been doing well and denies any recent symptoms of chest pain, chest pressure, shortness of breath, or leg swelling.  She denies orthopnea, PND, or heart palpitations.  She states that she was in [facility] in February and she experienced a heart attack.  She was visiting her son and described left arm pain and tightness as well as diaphoresis.  She was in the hospital and did not undergo any invasive evaluation.  She did well after that and reported no symptoms of heart failure or other complaints.  She has had no issues since that time.  She is compliant with her medications.   Past Medical History:  Diagnosis Date   Arthritis    "all over"   Atrial fibrillation [facility])    Diagnosed ~2009   Breast cancer, left breast (HCC) 02/07/1996   S/P chemo and mastectomy    CHF (congestive heart failure) (HCC)    GERD (gastroesophageal reflux disease)    Hyperlipidemia    Hypertension    Left shoulder pain    "MRI showed pinched nerve" - per pt   MI (myocardial infarction) (HCC)    OSA on CPAP    Pneumonia    "4-5 times" (01/06/2015)   Type II diabetes mellitus (HCC)     Past Surgical History:  Procedure Laterality Date    BIOPSY  07/23/2022   Procedure: BIOPSY;  Surgeon: Lynann Bologna, DO;  Location: [facility];  Service: Gastroenterology;;   BREAST BIOPSY Left 1998   CATARACT EXTRACTION W/ INTRAOCULAR LENS  IMPLANT, BILATERAL Bilateral    ESOPHAGOGASTRODUODENOSCOPY N/A 07/23/2022   Procedure: ESOPHAGOGASTRODUODENOSCOPY (EGD);  Surgeon: Lynann Bologna, DO;  Location: [facility];  Service: Gastroenterology;  Laterality: N/A;   JOINT REPLACEMENT     LAPAROSCOPIC CHOLECYSTECTOMY     LEFT HEART CATH AND CORONARY ANGIOGRAPHY N/A 07/07/2020   Procedure: LEFT HEART CATH AND CORONARY ANGIOGRAPHY;  Surgeon: Corky Crafts, MD;  Location: [facility] INVASIVE CV LAB;  Service: Cardiovascular;  Laterality: N/A;   MASTECTOMY Left 1998   TOTAL KNEE ARTHROPLASTY Left 2001   TOTAL KNEE REVISION Left 01/28/2013   Procedure: LEFT TOTAL KNEE ARTHROPLASTY REVISION;  Surgeon: Cheral Almas, MD;  Location: [facility] OR;  Service: Orthopedics;  Laterality: Left;    Current Medications: Current Meds  Medication Sig   albuterol (VENTOLIN HFA) 108 (90 Base) MCG/ACT inhaler Inhale 2 puffs into the lungs every 4 (four) hours as needed for wheezing or shortness of breath.   amLODipine (NORVASC) 5 MG tablet TAKE 1 TABLET (5 MG TOTAL) BY MOUTH DAILY.   atorvastatin (LIPITOR) 40 MG tablet TAKE 1 TABLET BY MOUTH EVERY DAY   Cholecalciferol (VITAMIN D) 2000 units tablet Take 2,000  Units by mouth daily.   FLOVENT HFA 110 MCG/ACT inhaler TAKE 2 PUFFS BY MOUTH TWICE A DAY   fluticasone (FLONASE) 50 MCG/ACT nasal spray Place 1 spray into both nostrils as needed.   furosemide (LASIX) 20 MG tablet TAKE 1 TABLET BY MOUTH EVERY DAY   gabapentin (NEURONTIN) 100 MG capsule Take 100-200 mg by mouth at bedtime.   HYDROcodone-acetaminophen (NORCO/VICODIN) 5-325 MG tablet Take 1 tablet by mouth every 8 (eight) hours as needed for moderate pain or severe pain.   hydrocortisone (ANUSOL-HC) 2.5 % rectal cream Place 1 Application rectally 2 (two)  times daily.   ketorolac (ACULAR) 0.5 % ophthalmic solution Place into both eyes daily.   liver oil-zinc oxide (DESITIN) 40 % ointment Apply topically as needed for irritation.   metoprolol tartrate (LOPRESSOR) 25 MG tablet TAKE 1 TABLET BY MOUTH TWICE A DAY   pantoprazole (PROTONIX) 40 MG tablet Take 1 tablet (40 mg total) by mouth daily.   Rivaroxaban (XARELTO) 15 MG TABS tablet Take 1 tablet (15 mg total) by mouth daily with supper.   traMADol (ULTRAM) 50 MG tablet Take 1 tablet (50 mg total) by mouth every 12 (twelve) hours as needed.     Allergies:   Ferumoxytol, Chocolate, Cocoa, Lisinopril, and Peanut oil   Social History   Socioeconomic History   Marital status: Widowed    Spouse name: Not on file   Number of children: Not on file   Years of education: Not on file   Highest education level: Not on file  Occupational History   Not on file  Tobacco Use   Smoking status: Never   Smokeless tobacco: Current    Types: Snuff  Vaping Use   Vaping status: Never Used  Substance and Sexual Activity   Alcohol use: Not Currently    Comment: 01/06/2015 "I'll have a beer q once in awhile"   Drug use: No   Sexual activity: Never  Other Topics Concern   Not on file  Social History Narrative   Moved from the [place] area to [place] ~ 54yrs ago to live with her daughter   Social Determinants of Health   Financial Resource Strain: Low Risk  (06/13/2022)   Received from [employer], [facility]   Overall Financial Resource Strain (CARDIA)    Difficulty of Paying Living Expenses: Not hard at all  Food Insecurity: No Food Insecurity (06/13/2022)   Received from [facility], [facility]   Hunger Vital Sign    Worried About Running Out of Food in the Last Year: Never true    Ran Out of Food in the Last Year: Never true  Transportation Needs: No Transportation Needs (06/13/2022)   Received from [employer], [facility]   PRAPARE - Transportation    Lack of Transportation  (Medical): No    Lack of Transportation (Non-Medical): No  Physical Activity: Not on file  Stress: Not on file  Social Connections: Unknown (05/16/2022)   Received from [facility], [facility]   Social Network    Social Network: Not on file     Family History: The patient's family history includes Emphysema in her brother; Heart attack in her mother. There is no history of Allergic rhinitis, Angioedema, Asthma, Eczema, Immunodeficiency, or Urticaria.  ROS:   Please see the history of present illness.    All other systems reviewed and are negative.       Recent Labs: 07/23/2022: ALT 8; BUN 16; Creatinine, Ser 0.84; Hemoglobin 8.6; Platelets 322;  Potassium 3.9; Sodium 136  Recent Lipid Panel    Component Value Date/Time   CHOL 148 07/04/2020 0341   TRIG 52 07/04/2020 0341   HDL 53 07/04/2020 0341   CHOLHDL 2.8 07/04/2020 0341   VLDL 10 07/04/2020 0341   LDLCALC 85 07/04/2020 0341   LDLDIRECT 46 12/22/2020 1549     Risk Assessment/Calculations:    CHA2DS2-VASc Score = 6   This indicates a 9.7% annual risk of stroke. The patient's score is based upon: CHF History: 1 HTN History: 1 Diabetes History: 0 Stroke History: 0 Vascular Disease History: 1 Age Score: 2 Gender Score: 1               Physical Exam:    VS:  BP 108/60   Pulse 85   Ht 5\' 4"  (1.626 m)   Wt 142 lb 9.6 oz (64.7 kg)   SpO2 97%   BMI 24.48 kg/m     Wt Readings from Last 3 Encounters:  10/12/22 142 lb 9.6 oz (64.7 kg)  07/23/22 141 lb 15.6 oz (64.4 kg)  05/12/22 142 lb (64.4 kg)     GEN:  Well nourished, well developed pleasant elderly woman in no acute distress HEENT: Normal NECK: No JVD; No carotid bruits LYMPHATICS: No lymphadenopathy CARDIAC: Irregularly irregular, no murmurs, rubs, gallops RESPIRATORY:  Clear to auscultation without rales, wheezing or rhonchi  ABDOMEN: Soft, non-tender, non-distended MUSCULOSKELETAL:  No edema; No deformity  SKIN: Warm and dry NEUROLOGIC:   Alert and oriented x 3 PSYCHIATRIC:  Normal affect   ASSESSMENT:    1. Persistent atrial fibrillation (HCC)   2. Heart failure with mildly reduced ejection fraction (HFmrEF) (HCC)   3. Hyperlipidemia LDL goal <70    PLAN:    In order of problems listed above:  Appears clinically stable.  Continue rivaroxaban for anticoagulation and metoprolol for rate control.  No changes are made today. With her description of an interval MI about 6 months ago, I have recommended a repeat echocardiogram.  She has no signs or symptoms of heart failure at present.  She continues on metoprolol.  Taking furosemide at low-dose. Treated with atorvastatin 40 mg daily.  Goal LDL cholesterol is less than 70.  The patient appears asymptomatic from a cardiac perspective.  I think we should update an echocardiogram.  Her last echo showed an LVEF of 50 to 55%, but she describes a "heart attack" that occurred when she was visiting her son in [place], [place].  She had no invasive evaluation performed at that time.  I think it is important to reassess her LVEF.  Otherwise we will continue her current medications.           Medication Adjustments/Labs and Tests Ordered: Current medicines are reviewed at length with the patient today.  Concerns regarding medicines are outlined above.  Orders Placed This Encounter  Procedures   ECHOCARDIOGRAM COMPLETE   No orders of the defined types were placed in this encounter.   Patient Instructions  Medication Instructions:  Your physician recommends that you continue on your current medications as directed. Please refer to the Current Medication list given to you today.  *If you need a refill on your cardiac medications before your next appointment, please call your pharmacy*  Lab Work: NONE If you have labs (blood work) drawn today and your tests are completely normal, you will receive your results only by: MyChart Message (if you have MyChart) OR A paper  copy in the mail If you  have any lab test that is abnormal or we need to change your treatment, we will call you to review the results.  Testing/Procedures: ECHO Your physician has requested that you have an echocardiogram. Echocardiography is a painless test that uses sound waves to create images of your heart. It provides your doctor with information about the size and shape of your heart and how well your heart's chambers and valves are working. This procedure takes approximately one hour. There are no restrictions for this procedure. Please do NOT wear cologne, perfume, aftershave, or lotions (deodorant is allowed). Please arrive 15 minutes prior to your appointment time.  Follow-Up: At [facility], you and your health needs are our priority.  As part of our continuing mission to provide you with exceptional heart care, we have created designated Provider Care Teams.  These Care Teams include your primary Cardiologist (physician) and Advanced Practice Providers (APPs -  Physician Assistants and Nurse Practitioners) who all work together to provide you with the care you need, when you need it.  Your next appointment:   1 year(s)  Provider:   Tonny Bollman, MD        Signed, Tonny Bollman, MD  10/12/2022 4:33 PM    [facility]

## 2022-10-17 ENCOUNTER — Ambulatory Visit (HOSPITAL_COMMUNITY): Payer: 59

## 2022-10-30 ENCOUNTER — Emergency Department (HOSPITAL_BASED_OUTPATIENT_CLINIC_OR_DEPARTMENT_OTHER)
Admission: EM | Admit: 2022-10-30 | Discharge: 2022-10-30 | Disposition: A | Discharge status: Home / Self Care | Payer: 59 | Attending: Emergency Medicine | Admitting: Emergency Medicine

## 2022-10-30 ENCOUNTER — Encounter (HOSPITAL_BASED_OUTPATIENT_CLINIC_OR_DEPARTMENT_OTHER): Payer: Self-pay | Admitting: Emergency Medicine

## 2022-10-30 ENCOUNTER — Emergency Department (HOSPITAL_BASED_OUTPATIENT_CLINIC_OR_DEPARTMENT_OTHER): Payer: 59

## 2022-10-30 ENCOUNTER — Other Ambulatory Visit: Payer: Self-pay

## 2022-10-30 DIAGNOSIS — R531 Weakness: Secondary | ICD-10-CM | POA: Insufficient documentation

## 2022-10-30 DIAGNOSIS — I517 Cardiomegaly: Secondary | ICD-10-CM | POA: Insufficient documentation

## 2022-10-30 DIAGNOSIS — I700 Atherosclerosis of aorta: Secondary | ICD-10-CM | POA: Insufficient documentation

## 2022-10-30 DIAGNOSIS — Z91010 Allergy to peanuts: Secondary | ICD-10-CM | POA: Insufficient documentation

## 2022-10-30 DIAGNOSIS — Z7901 Long term (current) use of anticoagulants: Secondary | ICD-10-CM | POA: Insufficient documentation

## 2022-10-30 DIAGNOSIS — I2510 Atherosclerotic heart disease of native coronary artery without angina pectoris: Secondary | ICD-10-CM | POA: Diagnosis not present

## 2022-10-30 DIAGNOSIS — M545 Low back pain, unspecified: Secondary | ICD-10-CM | POA: Insufficient documentation

## 2022-10-30 DIAGNOSIS — K5730 Diverticulosis of large intestine without perforation or abscess without bleeding: Secondary | ICD-10-CM | POA: Diagnosis not present

## 2022-10-30 LAB — URINALYSIS, ROUTINE W REFLEX MICROSCOPIC
Bilirubin Urine: NEGATIVE
Glucose, UA: NEGATIVE mg/dL
Ketones, ur: NEGATIVE mg/dL
Leukocytes,Ua: NEGATIVE
Nitrite: NEGATIVE
Protein, ur: NEGATIVE mg/dL
Specific Gravity, Urine: 1.015 (ref 1.005–1.030)
pH: 6 (ref 5.0–8.0)

## 2022-10-30 LAB — URINALYSIS, MICROSCOPIC (REFLEX)

## 2022-10-30 LAB — CBC WITH DIFFERENTIAL/PLATELET
Abs Immature Granulocytes: 0.02 10*3/uL (ref 0.00–0.07)
Basophils Absolute: 0 10*3/uL (ref 0.0–0.1)
Basophils Relative: 1 %
Eosinophils Absolute: 0.2 10*3/uL (ref 0.0–0.5)
Eosinophils Relative: 2 %
HCT: 28.7 % — ABNORMAL LOW (ref 36.0–46.0)
Hemoglobin: 8.7 g/dL — ABNORMAL LOW (ref 12.0–15.0)
Immature Granulocytes: 0 %
Lymphocytes Relative: 25 %
Lymphs Abs: 1.6 10*3/uL (ref 0.7–4.0)
MCH: 25.8 pg — ABNORMAL LOW (ref 26.0–34.0)
MCHC: 30.3 g/dL (ref 30.0–36.0)
MCV: 85.2 fL (ref 80.0–100.0)
Monocytes Absolute: 0.7 10*3/uL (ref 0.1–1.0)
Monocytes Relative: 11 %
Neutro Abs: 3.9 10*3/uL (ref 1.7–7.7)
Neutrophils Relative %: 61 %
Platelets: 287 10*3/uL (ref 150–400)
RBC: 3.37 MIL/uL — ABNORMAL LOW (ref 3.87–5.11)
RDW: 16.5 % — ABNORMAL HIGH (ref 11.5–15.5)
WBC: 6.3 10*3/uL (ref 4.0–10.5)
nRBC: 0 % (ref 0.0–0.2)

## 2022-10-30 LAB — COMPREHENSIVE METABOLIC PANEL
ALT: 10 U/L (ref 0–44)
AST: 18 U/L (ref 15–41)
Albumin: 4 g/dL (ref 3.5–5.0)
Alkaline Phosphatase: 49 U/L (ref 38–126)
Anion gap: 8 (ref 5–15)
BUN: 12 mg/dL (ref 8–23)
CO2: 25 mmol/L (ref 22–32)
Calcium: 9.4 mg/dL (ref 8.9–10.3)
Chloride: 105 mmol/L (ref 98–111)
Creatinine, Ser: 1.11 mg/dL — ABNORMAL HIGH (ref 0.44–1.00)
GFR, Estimated: 48 mL/min — ABNORMAL LOW (ref >60)
Glucose, Bld: 101 mg/dL — ABNORMAL HIGH (ref 70–99)
Potassium: 4.2 mmol/L (ref 3.5–5.1)
Sodium: 138 mmol/L (ref 135–145)
Total Bilirubin: 0.6 mg/dL (ref 0.3–1.2)
Total Protein: 7.4 g/dL (ref 6.5–8.1)

## 2022-10-30 LAB — LIPASE, BLOOD: Lipase: 46 U/L (ref 11–51)

## 2022-10-30 MED ORDER — DICLOFENAC SODIUM 1 % EX GEL: 4.0000 g | Freq: Four times a day (QID) | CUTANEOUS | 0 refills | Status: AC

## 2022-10-30 MED ORDER — PREMARIN 0.625 MG/GM VA CREA: 1.0000 | TOPICAL_CREAM | Freq: Every day | VAGINAL | 12 refills | Status: DC

## 2022-10-30 NOTE — Discharge Instructions (Addendum)
You could also try Vagisil over-the-counter.  Use the gel as prescribed on your back.  Also take tylenol 1000mg (2 extra strength) four times a day.

## 2022-10-30 NOTE — ED Notes (Signed)
ED Provider at bedside. 

## 2022-10-30 NOTE — ED Triage Notes (Signed)
Pt to ER with c/o lower abdominal pain, low back pain, diarrhea, and itching "in my privates".  Also states some weakness and urinary symptoms.  Pt states symptoms started last week.

## 2022-10-30 NOTE — ED Provider Notes (Signed)
[facility] Provider Note   CSN: [id number] Arrival date & time: 10/30/22  0945     History  Chief Complaint  Patient presents with   Abdominal Pain   Back Pain    [name] is a 89 y.o. female.  89 yo F with multiple complaints.  Tells me that she is primarily here for her back and abdominal pain has been going on for a couple weeks.  Mostly in her lower back all the way across.  Denies any radiation down the leg.  Worse with movement and ambulation.  Feels that same pain in her abdomen at times.  Denies nausea or vomiting.  Denies urinary symptoms.  She has had an itching to her groin that has been going on for months.  She keeps coming back to this in the history taking.  She also tells me that she has trouble using her left upper arm.  Tells me it is just weak.  She has trouble describing it otherwise.  Denies any pain to the area.  Denies trauma.   Abdominal Pain Back Pain Associated symptoms: abdominal pain        Home Medications Prior to Admission medications   Medication Sig Start Date End Date Taking? Authorizing Provider  conjugated estrogens (PREMARIN) vaginal cream Place 1 Applicatorful vaginally daily. 10/30/22  Yes Melene Plan, DO  diclofenac Sodium (VOLTAREN) 1 % GEL Apply 4 g topically 4 (four) times daily. 10/30/22  Yes Melene Plan, DO  albuterol (VENTOLIN HFA) 108 (90 Base) MCG/ACT inhaler Inhale 2 puffs into the lungs every 4 (four) hours as needed for wheezing or shortness of breath.    [provider]  amLODipine (NORVASC) 5 MG tablet TAKE 1 TABLET (5 MG TOTAL) BY MOUTH DAILY. 06/29/22   Tonny Bollman, MD  atorvastatin (LIPITOR) 40 MG tablet TAKE 1 TABLET BY MOUTH EVERY DAY 08/25/21   Tonny Bollman, MD  Cholecalciferol (VITAMIN D) 2000 units tablet Take 2,000 Units by mouth daily.    [provider]  FLOVENT HFA 110 MCG/ACT inhaler TAKE 2 PUFFS BY MOUTH TWICE A DAY 03/21/17   Bobbitt,  Heywood Iles, MD  fluticasone [facility]) 50 MCG/ACT nasal spray Place 1 spray into both nostrils as needed. 03/14/21   [provider]  furosemide (LASIX) 20 MG tablet TAKE 1 TABLET BY MOUTH EVERY DAY 05/04/21   Sharlene Dory, PA-C  gabapentin (NEURONTIN) 100 MG capsule Take 100-200 mg by mouth at bedtime. 06/22/20   [provider]  HYDROcodone-acetaminophen (NORCO/VICODIN) 5-325 MG tablet Take 1 tablet by mouth every 8 (eight) hours as needed for moderate pain or severe pain. 07/21/21   Juanda Chance, NP  hydrocortisone (ANUSOL-HC) 2.5 % rectal cream Place 1 Application rectally 2 (two) times daily. 10/12/21   Jacalyn Lefevre, MD  ketorolac (ACULAR) 0.5 % ophthalmic solution Place into both eyes daily. 11/02/20   [provider]  liver oil-zinc oxide (DESITIN) 40 % ointment Apply topically as needed for irritation. 09/13/20   Joseph Art, DO  metoprolol tartrate (LOPRESSOR) 25 MG tablet TAKE 1 TABLET BY MOUTH TWICE A DAY 09/22/21   Tonny Bollman, MD  pantoprazole (PROTONIX) 40 MG tablet Take 1 tablet (40 mg total) by mouth daily. 07/23/22 07/23/23  Lynann Bologna, DO  Rivaroxaban (XARELTO) 15 MG TABS tablet Take 1 tablet (15 mg total) by mouth daily with supper. 07/24/22   Lynann Bologna, DO  sucralfate (CARAFATE) 1 g tablet Take 1  tablet (1 g total) by mouth 4 (four) times daily for 7 days. 07/23/22 07/30/22  Lynann Bologna, DO  traMADol (ULTRAM) 50 MG tablet Take 1 tablet (50 mg total) by mouth every 12 (twelve) hours as needed. 11/06/21   Cristie Hem, PA-C      Allergies    Ferumoxytol, Chocolate, Cocoa, Lisinopril, and Peanut oil    Review of Systems   Review of Systems  Gastrointestinal:  Positive for abdominal pain.  Musculoskeletal:  Positive for back pain.    Physical Exam Updated Vital Signs BP 120/69   Pulse 79   Temp 98.3 F (36.8 C)   Resp 16   Ht 5\' 4"  (1.626 m)   Wt 64.9 kg   SpO2 95%   BMI 24.55 kg/m  Physical Exam Vitals and  nursing note reviewed.  Constitutional:      General: She is not in acute distress.    Appearance: She is well-developed. She is not diaphoretic.  HENT:     Head: Normocephalic and atraumatic.  Eyes:     Pupils: Pupils are equal, round, and reactive to light.  Cardiovascular:     Rate and Rhythm: Normal rate and regular rhythm.     Heart sounds: No murmur heard.    No friction rub. No gallop.  Pulmonary:     Effort: Pulmonary effort is normal.     Breath sounds: No wheezing or rales.  Abdominal:     General: There is no distension.     Palpations: Abdomen is soft.     Tenderness: There is no abdominal tenderness.  Genitourinary:    Comments: Slightly atrophic vagina, no obvious rash.  No drainage.  Patient depends was wet and there is some stool at the rectum. Musculoskeletal:        General: No tenderness.     Cervical back: Normal range of motion and neck supple.     Comments: Pulse motor and sensation intact in all 4 extremities.  Reflexes are 2+ and equal.  She has no drift with her upper extremity.  I do not appreciate any obvious weakness.  She does have issues with testing of the supraspinatus.  No obvious pain about the clavicle or scapula.   She points to her midline low back as area of discomfort.  No obvious midline tenderness step-offs or deformities.  No pain about the SI joints.  Skin:    General: Skin is warm and dry.  Neurological:     Mental Status: She is alert and oriented to person, place, and time.  Psychiatric:        Behavior: Behavior normal.     ED Results / Procedures / Treatments   Labs (all labs ordered are listed, but only abnormal results are displayed) Labs Reviewed  URINALYSIS, ROUTINE W REFLEX MICROSCOPIC - Abnormal; Notable for the following components:      Result Value   Hgb urine dipstick TRACE (*)    All other components within normal limits  CBC WITH DIFFERENTIAL/PLATELET - Abnormal; Notable for the following components:   RBC 3.37  (*)    Hemoglobin 8.7 (*)    HCT 28.7 (*)    MCH 25.8 (*)    RDW 16.5 (*)    All other components within normal limits  COMPREHENSIVE METABOLIC PANEL - Abnormal; Notable for the following components:   Glucose, Bld 101 (*)    Creatinine, Ser 1.11 (*)    GFR, Estimated 48 (*)    All other components  within normal limits  URINALYSIS, MICROSCOPIC (REFLEX) - Abnormal; Notable for the following components:   Bacteria, UA RARE (*)    All other components within normal limits  LIPASE, BLOOD    EKG None  Radiology CT Renal Stone Study  Result Date: 10/30/2022 CLINICAL DATA:  Abdominal/flank pain, stone suspected. Lower abdominal and low back pain. EXAM: CT ABDOMEN AND PELVIS WITHOUT CONTRAST TECHNIQUE: Multidetector CT imaging of the abdomen and pelvis was performed following the standard protocol without IV contrast. RADIATION DOSE REDUCTION: This exam was performed according to the departmental dose-optimization program which includes automated exposure control, adjustment of the mA and/or kV according to patient size and/or use of iterative reconstruction technique. COMPARISON:  None Available. FINDINGS: Lower chest: No acute findings. Unchanged right heart enlargement and coronary artery calcifications. Hepatobiliary: No focal liver abnormality is seen. Status post cholecystectomy. No biliary dilatation. Pancreas: Unremarkable. No pancreatic ductal dilatation or surrounding inflammatory changes. Spleen: Normal in size without focal abnormality. Adrenals/Urinary Tract: Adrenal glands are unremarkable. No suspicious renal mass. No calculi or hydronephrosis. Bladder is unremarkable. Stomach/Bowel: Normal stomach and duodenum. No dilated loops of small bowel. Normal appendix is visualized on axial image 65 series 301. Descending and sigmoid diverticulosis without surrounding inflammation to suggest acute diverticulitis. Vascular/Lymphatic: Aortic atherosclerosis. No enlarged abdominal or pelvic lymph  nodes. Reproductive: Uterus and bilateral adnexa are unremarkable. Other: No abdominal wall hernia or abnormality. No abdominopelvic ascites. Musculoskeletal: Moderate bilateral facet arthropathy at L4-5. IMPRESSION: 1. No acute abdominopelvic abnormality. No urinary tract calculi or hydronephrosis. 2. Descending and sigmoid diverticulosis without evidence of acute diverticulitis. 3. Unchanged right heart enlargement and coronary artery calcifications. Aortic Atherosclerosis (ICD10-I70.0). Electronically Signed   By: Orvan Falconer M.D.   On: 10/30/2022 12:28   CT L-SPINE NO CHARGE  Result Date: 10/30/2022 CLINICAL DATA:  Lower abdominal pain.  Low back pain and diarrhea. EXAM: CT Lumbar Spine without contrast TECHNIQUE: Technique: Multiplanar CT images of the lumbar spine were reconstructed from contemporary CT of the Abdomen and Pelvis. RADIATION DOSE REDUCTION: This exam was performed according to the departmental dose-optimization program which includes automated exposure control, adjustment of the mA and/or kV according to patient size and/or use of iterative reconstruction technique. CONTRAST:  None COMPARISON:  None Available. FINDINGS: Segmentation: 5 lumbar type vertebrae. Alignment: Mild degenerative anterolisthesis at L4-5, facet mediated Vertebrae: No acute fracture or incidental bone lesion. Generalized osteopenia Paraspinal and other soft tissues: Reported separately. No perispinal mass or inflammation is seen Disc levels: Diffusely preserved disc height with mild endplate degeneration for age. Degenerative facet spurring primarily at L3-4 and below , greatest at L4-5 and L5-S1. Mild L4-5 anterolisthesis. Up to moderate spinal stenosis due to listhesis, disc bulge, and facet spurring at L4-5. Diffusely patent appearance of the foramina with preserved perineural fat. IMPRESSION: 1. No acute finding. 2. Lumbar spine degeneration most notable at L4-5 where there is anterolisthesis and at least  moderate spinal stenosis. Electronically Signed   By: Tiburcio Pea M.D.   On: 10/30/2022 12:04   CT Head Wo Contrast  Result Date: 10/30/2022 CLINICAL DATA:  Lower back pain.  Weakness in urinary symptoms. EXAM: CT HEAD WITHOUT CONTRAST TECHNIQUE: Contiguous axial images were obtained from the base of the skull through the vertex without intravenous contrast. RADIATION DOSE REDUCTION: This exam was performed according to the departmental dose-optimization program which includes automated exposure control, adjustment of the mA and/or kV according to patient size and/or use of iterative reconstruction technique. COMPARISON:  08/29/2020 FINDINGS: Brain:  No evidence of acute infarction, hemorrhage, hydrocephalus, extra-axial collection or mass lesion/mass effect. For age, mild cerebral volume loss and white matter low-density. Vascular: No hyperdense vessel or unexpected calcification. Skull: Normal. Negative for fracture or focal lesion. Sinuses/Orbits: No acute finding. IMPRESSION: No acute or interval finding. Electronically Signed   By: Tiburcio Pea M.D.   On: 10/30/2022 11:55    Procedures Procedures    Medications Ordered in ED Medications - No data to display  ED Course/ Medical Decision Making/ A&P                                 Medical Decision Making Amount and/or Complexity of Data Reviewed Labs: ordered. Radiology: ordered.  Risk Prescription drug management.   89 yo F with multiple complaints.  She tells me she is here for her low back and abdominal pain.  That is been going on for some weeks.  Seems very mild on exam she is actually able to jump in and out of bed without eliciting any discomfort.  She ambulates independently.  She does tell me she has a history of breast cancer.  Will obtain CT imaging her abdomen pelvis with reformats of the L-spine.  She also told me that she just cannot use the left arm anymore.  She has trouble describing it any further.  The only  weakness that I can appreciate is with testing of the supraspinatus.  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  I think this is unlikely to be a stroke.  Been going on for at least 3 weeks per her.  Will obtain a CT of the head to assess for large stroke otherwise I feel she can follow-up with her family doctor for this in the office.  Patient seems to focus on vaginal itching.  Came up multiple times in the history taking and during the exam.  He has been seen in the ED for this previously.  Been going on for months if not years.  Could be due to poor hygiene.  I had a long talk with her and her family about how this needs to be followed up by the PCP or maybe OB/GYN in the office.  Ct head negative for ICH.  CT abd pelvis without obvious acute abnormality.  CT L spine without obvious acute finding.    12:33 PM:  I have discussed the diagnosis/risks/treatment options with the patient and family.  Evaluation and diagnostic testing in the emergency department does not suggest an emergent condition requiring admission or immediate intervention beyond what has been performed at this time.  They will follow up with PCP. We also discussed returning to the ED immediately if new or worsening sx occur. We discussed the sx which are most concerning (e.g., sudden worsening pain, fever, inability to tolerate by mouth) that necessitate immediate return. Medications administered to the patient during their visit and any new prescriptions provided to the patient are listed below.  Medications given during this visit Medications - No data to display   The patient appears reasonably screen and/or stabilized for discharge and I doubt any other medical condition or other [facility] requiring further screening, evaluation, or treatment in the ED at this time prior to discharge.          Final Clinical Impression(s) / ED Diagnoses Final diagnoses:  Acute  left-sided low back  pain, unspecified whether sciatica present    Rx / DC Orders ED Discharge Orders          Ordered    conjugated estrogens (PREMARIN) vaginal cream  Daily        10/30/22 1030    diclofenac Sodium (VOLTAREN) 1 % GEL  4 times daily        10/30/22 1231              Melene Plan, DO 10/30/22 1233

## 2022-10-31 ENCOUNTER — Ambulatory Visit (HOSPITAL_COMMUNITY): Payer: 59

## 2022-11-15 ENCOUNTER — Ambulatory Visit (HOSPITAL_COMMUNITY): Payer: 59 | Attending: Cardiovascular Disease

## 2022-11-15 DIAGNOSIS — I5022 Chronic systolic (congestive) heart failure: Secondary | ICD-10-CM

## 2022-11-15 DIAGNOSIS — I4819 Other persistent atrial fibrillation: Secondary | ICD-10-CM | POA: Diagnosis present

## 2022-11-15 DIAGNOSIS — E785 Hyperlipidemia, unspecified: Secondary | ICD-10-CM | POA: Diagnosis not present

## 2022-11-16 LAB — ECHOCARDIOGRAM COMPLETE
Area-P 1/2: 5.75 cm2
S' Lateral: 4 cm

## 2022-11-27 ENCOUNTER — Ambulatory Visit: Payer: 59 | Attending: Cardiovascular Disease | Admitting: Cardiovascular Disease

## 2022-11-27 ENCOUNTER — Encounter: Payer: Self-pay | Admitting: Cardiovascular Disease

## 2022-11-27 VITALS — BP 136/70 | HR 56 | Ht 64.0 in | Wt 142.2 lb

## 2022-11-27 DIAGNOSIS — I4819 Other persistent atrial fibrillation: Secondary | ICD-10-CM | POA: Diagnosis not present

## 2022-11-27 DIAGNOSIS — I071 Rheumatic tricuspid insufficiency: Secondary | ICD-10-CM

## 2022-11-27 DIAGNOSIS — Z79899 Other long term (current) drug therapy: Secondary | ICD-10-CM

## 2022-11-27 MED ORDER — SPIRONOLACTONE 25 MG PO TABS: 12.5000 mg | ORAL_TABLET | Freq: Every day | ORAL | 3 refills | Status: DC

## 2022-11-27 NOTE — Progress Notes (Signed)
Cardiology Office Note:    Date:  11/27/2022   ID:  [name], DOB 01/14/1934, MRN [id number]  PCP:  Estevan Oaks, NP   [facility] Providers Cardiologist:  Tonny Bollman, MD     Referring MD: Estevan Oaks, NP   Chief Complaint  Patient presents with   Shortness of Breath    History of Present Illness:    [name] is a 89 y.o. female presenting for follow-up of congestive heart failure and valvular heart disease.  The patient is here with her son today.  She was recently seen for follow-up of permanent atrial fibrillation, hypertension, nonobstructive CAD.  She underwent an echocardiogram and this demonstrated marked right atrial dilatation and torrential tricuspid regurgitation.  She is brought back again to assess her valvular heart disease and heart failure symptoms.  The patient reports that she has some fatigue and shortness of breath with activity.  She denies orthopnea, PND, or leg edema.  She occasionally has abdominal swelling and early satiety.  No chest pain or pressure.  Past Medical History:  Diagnosis Date   Arthritis    "all over"   Atrial fibrillation [facility])    Diagnosed ~2009   Breast cancer, left breast (HCC) 02/07/1996   S/P chemo and mastectomy    CHF (congestive heart failure) (HCC)    GERD (gastroesophageal reflux disease)    Hyperlipidemia    Hypertension    Left shoulder pain    "MRI showed pinched nerve" - per pt   MI (myocardial infarction) (HCC)    OSA on CPAP    Pneumonia    "4-5 times" (01/06/2015)   Type II diabetes mellitus (HCC)     Past Surgical History:  Procedure Laterality Date   BIOPSY  07/23/2022   Procedure: BIOPSY;  Surgeon: Lynann Bologna, DO;  Location: [facility];  Service: Gastroenterology;;   BREAST BIOPSY Left 1998   CATARACT EXTRACTION W/ INTRAOCULAR LENS  IMPLANT, BILATERAL Bilateral    ESOPHAGOGASTRODUODENOSCOPY N/A 07/23/2022   Procedure: ESOPHAGOGASTRODUODENOSCOPY (EGD);   Surgeon: Lynann Bologna, DO;  Location: [facility];  Service: Gastroenterology;  Laterality: N/A;   JOINT REPLACEMENT     LAPAROSCOPIC CHOLECYSTECTOMY     LEFT HEART CATH AND CORONARY ANGIOGRAPHY N/A 07/07/2020   Procedure: LEFT HEART CATH AND CORONARY ANGIOGRAPHY;  Surgeon: Corky Crafts, MD;  Location: [facility] INVASIVE CV LAB;  Service: Cardiovascular;  Laterality: N/A;   MASTECTOMY Left 1998   TOTAL KNEE ARTHROPLASTY Left 2001   TOTAL KNEE REVISION Left 01/28/2013   Procedure: LEFT TOTAL KNEE ARTHROPLASTY REVISION;  Surgeon: Cheral Almas, MD;  Location: [facility] OR;  Service: Orthopedics;  Laterality: Left;    Current Medications: Current Meds  Medication Sig   albuterol (VENTOLIN HFA) 108 (90 Base) MCG/ACT inhaler Inhale 2 puffs into the lungs every 4 (four) hours as needed for wheezing or shortness of breath.   alendronate (FOSAMAX) 70 MG tablet Take 70 mg by mouth once a week.   atorvastatin (LIPITOR) 40 MG tablet TAKE 1 TABLET BY MOUTH EVERY DAY   Cholecalciferol (VITAMIN D) 2000 units tablet Take 2,000 Units by mouth daily.   conjugated estrogens (PREMARIN) vaginal cream Place 1 Applicatorful vaginally daily.   diclofenac Sodium (VOLTAREN) 1 % GEL Apply 4 g topically 4 (four) times daily.   FLOVENT HFA 110 MCG/ACT inhaler TAKE 2 PUFFS BY MOUTH TWICE A DAY   fluticasone (FLONASE) 50 MCG/ACT nasal spray Place 1 spray into both nostrils as needed.  furosemide (LASIX) 20 MG tablet TAKE 1 TABLET BY MOUTH EVERY DAY   gabapentin (NEURONTIN) 100 MG capsule Take 100-200 mg by mouth at bedtime.   HYDROcodone-acetaminophen (NORCO/VICODIN) 5-325 MG tablet Take 1 tablet by mouth every 8 (eight) hours as needed for moderate pain or severe pain.   hydrocortisone (ANUSOL-HC) 2.5 % rectal cream Place 1 Application rectally 2 (two) times daily.   ketorolac (ACULAR) 0.5 % ophthalmic solution Place into both eyes daily.   latanoprost (XALATAN) 0.005 % ophthalmic solution SMARTSIG:1 Drop(s) In  Eye(s) Every Evening   liver oil-zinc oxide (DESITIN) 40 % ointment Apply topically as needed for irritation.   losartan (COZAAR) 25 MG tablet Take 25 mg by mouth daily.   metoprolol succinate (TOPROL-XL) 25 MG 24 hr tablet Take 25 mg by mouth daily.   metoprolol tartrate (LOPRESSOR) 25 MG tablet TAKE 1 TABLET BY MOUTH TWICE A DAY   pantoprazole (PROTONIX) 40 MG tablet Take 1 tablet (40 mg total) by mouth daily.   permethrin (ELIMITE) 5 % cream Apply topically.   Rivaroxaban (XARELTO) 15 MG TABS tablet Take 1 tablet (15 mg total) by mouth daily with supper.   spironolactone (ALDACTONE) 25 MG tablet Take 0.5 tablets (12.5 mg total) by mouth daily.   traMADol (ULTRAM) 50 MG tablet Take 1 tablet (50 mg total) by mouth every 12 (twelve) hours as needed.   [DISCONTINUED] amLODipine (NORVASC) 5 MG tablet TAKE 1 TABLET (5 MG TOTAL) BY MOUTH DAILY.     Allergies:   Ferumoxytol, Chocolate, Cocoa, Lisinopril, and Peanut oil   Social History   Socioeconomic History   Marital status: Widowed    Spouse name: Not on file   Number of children: Not on file   Years of education: Not on file   Highest education level: Not on file  Occupational History   Not on file  Tobacco Use   Smoking status: Never   Smokeless tobacco: Current    Types: Snuff  Vaping Use   Vaping status: Never Used  Substance and Sexual Activity   Alcohol use: Not Currently    Comment: 01/06/2015 "I'll have a beer q once in awhile"   Drug use: No   Sexual activity: Never  Other Topics Concern   Not on file  Social History Narrative   Moved from the [place] area to [place] ~ 23yrs ago to live with her daughter   Social Determinants of Health   Financial Resource Strain: Low Risk  (06/13/2022)   Received from [facility], [facility]   Overall Financial Resource Strain (CARDIA)    Difficulty of Paying Living Expenses: Not hard at all  Food Insecurity: No Food Insecurity (06/13/2022)   Received from [facility],  [facility]   Hunger Vital Sign    Worried About Running Out of Food in the Last Year: Never true    Ran Out of Food in the Last Year: Never true  Transportation Needs: No Transportation Needs (06/13/2022)   Received from [employer], [facility]   PRAPARE - Transportation    Lack of Transportation (Medical): No    Lack of Transportation (Non-Medical): No  Physical Activity: Not on file  Stress: Not on file  Social Connections: Unknown (05/16/2022)   Received from [facility], [facility]   Social Network    Social Network: Not on file     Family History: The patient's family history includes Emphysema in her brother; Heart attack in her mother. There is no history of Allergic  rhinitis, Angioedema, Asthma, Eczema, Immunodeficiency, or Urticaria.  ROS:   Please see the history of present illness.    All other systems reviewed and are negative.  EKGs/Labs/Other Studies Reviewed:    The following studies were reviewed today: 2D echocardiogram: 1. Left ventricular ejection fraction by 3D volume is 51 %. The left  ventricle has low normal function. The left ventricle demonstrates  regional wall motion abnormalities (see scoring diagram/findings for  description). Left ventricular diastolic  parameters are indeterminate.   2. Right ventricular systolic function is moderately reduced. The right  ventricular size is severely enlarged. There is mildly elevated pulmonary  artery systolic pressure. The estimated right ventricular systolic  pressure is 38.2 mmHg.   3. Left atrial size was severely dilated.   4. Right atrial size was massively dilated.   5. The mitral valve is degenerative. Mild mitral valve regurgitation. No  evidence of mitral stenosis.   6. Tricuspid valve regurgitation is torrential.   7. The aortic valve is normal in structure. There is mild calcification  of the aortic valve. Aortic valve regurgitation is trivial. No aortic  stenosis is present.   8. The  inferior vena cava is dilated in size with >50% respiratory  variability, suggesting right atrial pressure of 8 mmHg.   Comparison(s): Prior images reviewed side by side. RV function has  decreased from mild to moderately decreased. RV size has not significantly  changed despite differences in reporting.       Recent Labs: 10/30/2022: ALT 10; BUN 12; Creatinine, Ser 1.11; Hemoglobin 8.7; Platelets 287; Potassium 4.2; Sodium 138  Recent Lipid Panel    Component Value Date/Time   CHOL 148 07/04/2020 0341   TRIG 52 07/04/2020 0341   HDL 53 07/04/2020 0341   CHOLHDL 2.8 07/04/2020 0341   VLDL 10 07/04/2020 0341   LDLCALC 85 07/04/2020 0341   LDLDIRECT 46 12/22/2020 1549     Risk Assessment/Calculations:                Physical Exam:    VS:  BP 136/70   Pulse (!) 56   Ht 5\' 4"  (1.626 m)   Wt 142 lb 3.2 oz (64.5 kg)   SpO2 97%   BMI 24.41 kg/m     Wt Readings from Last 3 Encounters:  11/27/22 142 lb 3.2 oz (64.5 kg)  10/30/22 143 lb (64.9 kg)  10/12/22 142 lb 9.6 oz (64.7 kg)     GEN:  Well nourished, well developed in no acute distress HEENT: Normal NECK: No JVD; No carotid bruits LYMPHATICS: No lymphadenopathy CARDIAC: Irregularly irregular, soft systolic murmur at the left lower sternal border RESPIRATORY:  Clear to auscultation without rales, wheezing or rhonchi  ABDOMEN: Soft, non-tender, non-distended MUSCULOSKELETAL:  No edema; No deformity  SKIN: Warm and dry NEUROLOGIC:  Alert and oriented x 3 PSYCHIATRIC:  Normal affect   ASSESSMENT:    1. Medication management   2. Severe tricuspid regurgitation   3. Persistent atrial fibrillation (HCC)    PLAN:    In order of problems listed above:  I am going to change the patient from amlodipine to spironolactone in the setting of her severe tricuspid regurgitation.  Will check a metabolic panel in a few weeks.  Will arrange follow-up in 4 months. Reviewed the finding of severe tricuspid regurgitation with  her.  She has progressed from severe now to torrential tricuspid regurgitation.  She appears to have NYHA functional class II symptoms.  It is difficult in the  context of her advanced age.  We discussed potential treatment strategies.  With normal renal function and no marked limitation, I think it is appropriate to treat her medically.  Will make medication changes outlined above.  She would not be interested in pursuing surgical treatment nor would she be a candidate at age 55.  However, transcatheter therapies were discussed today and this could be a potential option for her down the road if she has more difficulty.  Hopefully we can manage her with medical therapy and adjustment in her diuretic regimen.  I suspect the mechanism for tricuspid regurgitation is related to atrial fibrillation and a markedly dilated right atrium causing annular dilatation. Continue anticoagulation and rate control.  Tolerating rivaroxaban.           Medication Adjustments/Labs and Tests Ordered: Current medicines are reviewed at length with the patient today.  Concerns regarding medicines are outlined above.  Orders Placed This Encounter  Procedures   Basic metabolic panel   Meds ordered this encounter  Medications   spironolactone (ALDACTONE) 25 MG tablet    Sig: Take 0.5 tablets (12.5 mg total) by mouth daily.    Dispense:  45 tablet    Refill:  3    Patient Instructions  Medication Instructions:  STOP Amlodipine START Spironolactone 12.5mg  daily *If you need a refill on your cardiac medications before your next appointment, please call your pharmacy*  Lab Work: BMET in 2-3 weeks If you have labs (blood work) drawn today and your tests are completely normal, you will receive your results only by: MyChart Message (if you have MyChart) OR A paper copy in the mail If you have any lab test that is abnormal or we need to change your treatment, we will call you to review the results.  Follow-Up: At  [facility], you and your health needs are our priority.  As part of our continuing mission to provide you with exceptional heart care, we have created designated Provider Care Teams.  These Care Teams include your primary Cardiologist (physician) and Advanced Practice Providers (APPs -  Physician Assistants and Nurse Practitioners) who all work together to provide you with the care you need, when you need it.    Your next appointment:   4 month(s)  Provider:   Jaci Carrel, or Sebastian Ache, MD  11/27/2022 5:22 PM    [facility]

## 2022-11-27 NOTE — Patient Instructions (Signed)
Medication Instructions:  STOP Amlodipine START Spironolactone 12.5mg  daily *If you need a refill on your cardiac medications before your next appointment, please call your pharmacy*  Lab Work: BMET in 2-3 weeks If you have labs (blood work) drawn today and your tests are completely normal, you will receive your results only by: MyChart Message (if you have MyChart) OR A paper copy in the mail If you have any lab test that is abnormal or we need to change your treatment, we will call you to review the results.  Follow-Up: At [facility], you and your health needs are our priority.  As part of our continuing mission to provide you with exceptional heart care, we have created designated Provider Care Teams.  These Care Teams include your primary Cardiologist (physician) and Advanced Practice Providers (APPs -  Physician Assistants and Nurse Practitioners) who all work together to provide you with the care you need, when you need it.    Your next appointment:   4 month(s)  Provider:   Jaci Carrel, or [employer]

## 2022-12-13 ENCOUNTER — Other Ambulatory Visit: Payer: Self-pay | Admitting: *Deleted

## 2022-12-13 DIAGNOSIS — Z79899 Other long term (current) drug therapy: Secondary | ICD-10-CM

## 2022-12-15 ENCOUNTER — Ambulatory Visit: Payer: 59 | Attending: Cardiovascular Disease

## 2022-12-19 ENCOUNTER — Ambulatory Visit: Payer: 59 | Attending: Cardiovascular Disease

## 2022-12-19 ENCOUNTER — Other Ambulatory Visit: Payer: Self-pay | Admitting: *Deleted

## 2022-12-19 ENCOUNTER — Other Ambulatory Visit: Payer: Self-pay | Admitting: Cardiovascular Disease

## 2022-12-20 ENCOUNTER — Other Ambulatory Visit: Payer: Self-pay

## 2022-12-20 ENCOUNTER — Emergency Department (HOSPITAL_BASED_OUTPATIENT_CLINIC_OR_DEPARTMENT_OTHER)
Admission: EM | Admit: 2022-12-20 | Discharge: 2022-12-20 | Disposition: A | Discharge status: Home / Self Care | Payer: 59 | Attending: Emergency Medicine | Admitting: Emergency Medicine

## 2022-12-20 ENCOUNTER — Telehealth: Payer: Self-pay | Admitting: Cardiovascular Disease

## 2022-12-20 ENCOUNTER — Encounter (HOSPITAL_BASED_OUTPATIENT_CLINIC_OR_DEPARTMENT_OTHER): Payer: Self-pay

## 2022-12-20 DIAGNOSIS — Z79899 Other long term (current) drug therapy: Secondary | ICD-10-CM

## 2022-12-20 DIAGNOSIS — R799 Abnormal finding of blood chemistry, unspecified: Secondary | ICD-10-CM | POA: Insufficient documentation

## 2022-12-20 DIAGNOSIS — R531 Weakness: Secondary | ICD-10-CM | POA: Diagnosis not present

## 2022-12-20 DIAGNOSIS — N189 Chronic kidney disease, unspecified: Secondary | ICD-10-CM | POA: Insufficient documentation

## 2022-12-20 DIAGNOSIS — Z91010 Allergy to peanuts: Secondary | ICD-10-CM | POA: Insufficient documentation

## 2022-12-20 DIAGNOSIS — R899 Unspecified abnormal finding in specimens from other organs, systems and tissues: Secondary | ICD-10-CM

## 2022-12-20 LAB — BASIC METABOLIC PANEL
Anion gap: 7 (ref 5–15)
BUN/Creatinine Ratio: 19 (ref 12–28)
BUN: 28 mg/dL — ABNORMAL HIGH (ref 8–27)
BUN: 31 mg/dL — ABNORMAL HIGH (ref 8–23)
CO2: 16 mmol/L — ABNORMAL LOW (ref 20–29)
CO2: 21 mmol/L — ABNORMAL LOW (ref 22–32)
Calcium: 9.2 mg/dL (ref 8.7–10.3)
Calcium: 8.5 mg/dL — ABNORMAL LOW (ref 8.9–10.3)
Chloride: 104 mmol/L (ref 96–106)
Chloride: 103 mmol/L (ref 98–111)
Creatinine, Ser: 1.51 mg/dL — ABNORMAL HIGH (ref 0.57–1.00)
Creatinine, Ser: 1.36 mg/dL — ABNORMAL HIGH (ref 0.44–1.00)
GFR, Estimated: 38 mL/min — ABNORMAL LOW (ref >60)
Glucose, Bld: 109 mg/dL — ABNORMAL HIGH (ref 70–99)
Glucose: 99 mg/dL (ref 70–99)
Potassium: 6.1 mmol/L (ref 3.5–5.2)
Potassium: 4.9 mmol/L (ref 3.5–5.1)
Sodium: 137 mmol/L (ref 134–144)
Sodium: 131 mmol/L — ABNORMAL LOW (ref 135–145)
eGFR: 33 mL/min/{1.73_m2} — ABNORMAL LOW (ref >59)

## 2022-12-20 NOTE — ED Provider Notes (Signed)
[place] EMERGENCY DEPARTMENT AT [facility]  Provider Note  CSN: [id number] Arrival date & time: 12/20/22 0502  History Chief Complaint  Patient presents with   Abnormal Lab    [name] is a 89 y.o. female with multiple medical problems called tonight with elevated K on labs drawn at Cardiology office yesterday. She has CKD (managed by Nephrology) and she is on spironolactone. She has had 1-2 weeks of generalized weakness, but no other more specific complaints.    Home Medications Prior to Admission medications   Medication Sig Start Date End Date Taking? Authorizing Provider  albuterol (VENTOLIN HFA) 108 (90 Base) MCG/ACT inhaler Inhale 2 puffs into the lungs every 4 (four) hours as needed for wheezing or shortness of breath.    [provider]  alendronate (FOSAMAX) 70 MG tablet Take 70 mg by mouth once a week. 10/21/22   [provider]  atorvastatin (LIPITOR) 40 MG tablet TAKE 1 TABLET BY MOUTH EVERY DAY 08/25/21   Tonny Bollman, MD  Cholecalciferol (VITAMIN D) 2000 units tablet Take 2,000 Units by mouth daily.    [provider]  conjugated estrogens (PREMARIN) vaginal cream Place 1 Applicatorful vaginally daily. 10/30/22   Melene Plan, DO  diclofenac Sodium (VOLTAREN) 1 % GEL Apply 4 g topically 4 (four) times daily. 10/30/22   Melene Plan, DO  FLOVENT HFA 110 MCG/ACT inhaler TAKE 2 PUFFS BY MOUTH TWICE A DAY 03/21/17   Bobbitt, Heywood Iles, MD  fluticasone [facility]) 50 MCG/ACT nasal spray Place 1 spray into both nostrils as needed. 03/14/21   [provider]  furosemide (LASIX) 20 MG tablet TAKE 1 TABLET BY MOUTH EVERY DAY 05/04/21   Sharlene Dory, PA-C  gabapentin (NEURONTIN) 100 MG capsule Take 100-200 mg by mouth at bedtime. 06/22/20   [provider]  HYDROcodone-acetaminophen (NORCO/VICODIN) 5-325 MG tablet Take 1 tablet by mouth every 8 (eight) hours as needed for moderate pain or severe pain. 07/21/21   Juanda Chance, NP  hydrocortisone (ANUSOL-HC) 2.5 % rectal cream Place 1 Application rectally 2 (two) times daily. 10/12/21   Jacalyn Lefevre, MD  ketorolac (ACULAR) 0.5 % ophthalmic solution Place into both eyes daily. 11/02/20   [provider]  latanoprost (XALATAN) 0.005 % ophthalmic solution SMARTSIG:1 Drop(s) In Eye(s) Every Evening 11/17/22   [provider]  liver oil-zinc oxide (DESITIN) 40 % ointment Apply topically as needed for irritation. 09/13/20   Joseph Art, DO  losartan (COZAAR) 25 MG tablet Take 25 mg by mouth daily. 10/23/22   [provider]  metoprolol succinate (TOPROL-XL) 25 MG 24 hr tablet Take 25 mg by mouth daily. 10/23/22   [provider]  metoprolol tartrate (LOPRESSOR) 25 MG tablet TAKE 1 TABLET BY MOUTH TWICE A DAY 09/22/21   Tonny Bollman, MD  pantoprazole (PROTONIX) 40 MG tablet Take 1 tablet (40 mg total) by mouth daily. 07/23/22 07/23/23  Lynann Bologna, DO  permethrin (ELIMITE) 5 % cream Apply topically. 11/13/22   [provider]  Rivaroxaban (XARELTO) 15 MG TABS tablet Take 1 tablet (15 mg total) by mouth daily with supper. 07/24/22   Lynann Bologna, DO  spironolactone (ALDACTONE) 25 MG tablet Take 0.5 tablets (12.5 mg total) by mouth daily. 11/27/22   Tonny Bollman, MD  sucralfate (CARAFATE) 1 g tablet Take 1 tablet (1 g total) by mouth 4 (four) times daily for 7 days. 07/23/22 07/30/22  Lynann Bologna, DO  traMADol (ULTRAM) 50 MG  tablet Take 1 tablet (50 mg total) by mouth every 12 (twelve) hours as needed. 11/06/21   Cristie Hem, PA-C     Allergies    Ferumoxytol, Chocolate, Cocoa, Lisinopril, and Peanut oil   Review of Systems   Review of Systems Please see HPI for pertinent positives and negatives  Physical Exam BP 136/69 (BP Location: Right Arm)   Pulse 74   Temp 98.4 F (36.9 C) (Oral)   Resp 17   SpO2 100%   Physical Exam Vitals and nursing note reviewed.  HENT:     Head: Normocephalic.      Nose: Nose normal.  Eyes:     Extraocular Movements: Extraocular movements intact.  Pulmonary:     Effort: Pulmonary effort is normal.  Musculoskeletal:        General: Normal range of motion.     Cervical back: Neck supple.  Skin:    Findings: No rash (on exposed skin).  Neurological:     Mental Status: She is alert and oriented to person, place, and time.  Psychiatric:        Mood and Affect: Mood normal.     ED Results / Procedures / Treatments   EKG None  Procedures Procedures  Medications Ordered in the ED Medications - No data to display  Initial Impression and Plan  Patient here with outpatient labs showing mild AKI and hyperkalemia. Will recheck here to confirm. Check EKG.   ED Course   Clinical Course as of 12/20/22 0648  Wed Dec 20, 2022  0549 BMP here does not show hyperkalemia, EKG with stable afib. She has some mild increase in Cr from previous baseline, but improved from labs done yesterday. No indication for admission at this time. Recommend she stay hydrated at home. Follow up with PCP, Nephrology and Cardiology for long term management of her medical problems. RTED for any other concerns.  [CS]    Clinical Course User Index [CS] Pollyann Savoy, MD     MDM Rules/Calculators/A&P Medical Decision Making Problems Addressed: Abnormal laboratory test: acute illness or injury  Amount and/or Complexity of Data Reviewed Labs: ordered. Decision-making details documented in ED Course. ECG/medicine tests: ordered and independent interpretation performed. Decision-making details documented in ED Course.     Final Clinical Impression(s) / ED Diagnoses Final diagnoses:  Abnormal laboratory test    Rx / DC Orders ED Discharge Orders     None        Pollyann Savoy, MD 12/20/22 [phone]

## 2022-12-20 NOTE — ED Triage Notes (Signed)
Pt received telephone call from doctor stating her potassium level was 6.1. Pt endorses dizziness x1 week. Pt also SOB x3 weeks--saw cardiology and had ECHO that showed leaky valve. Pt states her LT arm has been weak x1 month.

## 2022-12-20 NOTE — Telephone Encounter (Signed)
Paged by lab for regarding critical lab, potassium of 6.1.  There is no reported evidence of hemolysis.  Called this would occur at her home number and reached her daughter Deanna Artis, who is listed as alternate contact.   Patient has been taking spironolactone as instructed by Dr. Excell Seltzer.  I instructed Deanna Artis that given the elevated potassium, it was in her mother's best interest to present to the nearest emergency room for further evaluation and possible treatment of the hyperkalemia.  Have also asked her to stop taking spironolactone.  She was understanding and all questions were answered.  Will alert Dr. Excell Seltzer.  Marya Amsler, MD

## 2022-12-21 ENCOUNTER — Other Ambulatory Visit: Payer: Self-pay

## 2022-12-21 DIAGNOSIS — Z79899 Other long term (current) drug therapy: Secondary | ICD-10-CM

## 2022-12-21 MED ORDER — FUROSEMIDE 40 MG PO TABS: 40.0000 mg | ORAL_TABLET | Freq: Every day | ORAL | 3 refills | Status: DC

## 2022-12-21 MED ORDER — FUROSEMIDE 40 MG PO TABS: 40.0000 mg | ORAL_TABLET | Freq: Every day | ORAL | 3 refills | Status: AC

## 2022-12-21 NOTE — Telephone Encounter (Signed)
Reviewed his chart and follow-up lab work drawn in the emergency room.  Her potassium was 4.9 and the creatinine was up to 1.36 which is above her baseline.  I think she is likely to have some problems tolerating spironolactone and I recommend that we stop this.  Would increase her furosemide to 40 mg daily.  Repeat metabolic panel in 2 to 3 weeks.  Thanks

## 2022-12-21 NOTE — Addendum Note (Signed)
Addended by: Lars Mage on: 12/21/2022 01:40 PM   Modules accepted: Orders

## 2022-12-21 NOTE — Telephone Encounter (Signed)
Spoke with daughter Deanna Artis on [place] who verbalized understanding of stopping spironolactone and increasing Furosemide to 40mg  daily. Order placed for BMET and she will see that it's drawn by first week of December.

## 2022-12-27 ENCOUNTER — Other Ambulatory Visit: Payer: Self-pay

## 2022-12-27 ENCOUNTER — Emergency Department (HOSPITAL_BASED_OUTPATIENT_CLINIC_OR_DEPARTMENT_OTHER): Payer: 59

## 2022-12-27 ENCOUNTER — Emergency Department (HOSPITAL_BASED_OUTPATIENT_CLINIC_OR_DEPARTMENT_OTHER)
Admission: EM | Admit: 2022-12-27 | Discharge: 2022-12-27 | Disposition: A | Discharge status: Home / Self Care | Payer: 59

## 2022-12-27 ENCOUNTER — Encounter (HOSPITAL_BASED_OUTPATIENT_CLINIC_OR_DEPARTMENT_OTHER): Payer: Self-pay | Admitting: *Deleted

## 2022-12-27 DIAGNOSIS — E119 Type 2 diabetes mellitus without complications: Secondary | ICD-10-CM | POA: Diagnosis not present

## 2022-12-27 DIAGNOSIS — R42 Dizziness and giddiness: Secondary | ICD-10-CM | POA: Diagnosis present

## 2022-12-27 DIAGNOSIS — I509 Heart failure, unspecified: Secondary | ICD-10-CM | POA: Insufficient documentation

## 2022-12-27 DIAGNOSIS — I4891 Unspecified atrial fibrillation: Secondary | ICD-10-CM | POA: Insufficient documentation

## 2022-12-27 DIAGNOSIS — I2510 Atherosclerotic heart disease of native coronary artery without angina pectoris: Secondary | ICD-10-CM | POA: Diagnosis not present

## 2022-12-27 DIAGNOSIS — Z79899 Other long term (current) drug therapy: Secondary | ICD-10-CM | POA: Diagnosis not present

## 2022-12-27 DIAGNOSIS — I110 Hypertensive heart disease with heart failure: Secondary | ICD-10-CM | POA: Diagnosis not present

## 2022-12-27 DIAGNOSIS — Z7901 Long term (current) use of anticoagulants: Secondary | ICD-10-CM | POA: Diagnosis not present

## 2022-12-27 DIAGNOSIS — Z91010 Allergy to peanuts: Secondary | ICD-10-CM | POA: Diagnosis not present

## 2022-12-27 LAB — BASIC METABOLIC PANEL
Anion gap: 11 (ref 5–15)
BUN: 38 mg/dL — ABNORMAL HIGH (ref 8–23)
CO2: 24 mmol/L (ref 22–32)
Calcium: 9.4 mg/dL (ref 8.9–10.3)
Chloride: 100 mmol/L (ref 98–111)
Creatinine, Ser: 1.42 mg/dL — ABNORMAL HIGH (ref 0.44–1.00)
GFR, Estimated: 36 mL/min — ABNORMAL LOW (ref >60)
Glucose, Bld: 104 mg/dL — ABNORMAL HIGH (ref 70–99)
Potassium: 4.6 mmol/L (ref 3.5–5.1)
Sodium: 135 mmol/L (ref 135–145)

## 2022-12-27 LAB — CBC
HCT: 32.2 % — ABNORMAL LOW (ref 36.0–46.0)
Hemoglobin: 10.5 g/dL — ABNORMAL LOW (ref 12.0–15.0)
MCH: 27.2 pg (ref 26.0–34.0)
MCHC: 32.6 g/dL (ref 30.0–36.0)
MCV: 83.4 fL (ref 80.0–100.0)
Platelets: 281 10*3/uL (ref 150–400)
RBC: 3.86 MIL/uL — ABNORMAL LOW (ref 3.87–5.11)
RDW: 18.1 % — ABNORMAL HIGH (ref 11.5–15.5)
WBC: 7.2 10*3/uL (ref 4.0–10.5)
nRBC: 0 % (ref 0.0–0.2)

## 2022-12-27 LAB — TROPONIN I (HIGH SENSITIVITY): Troponin I (High Sensitivity): 10 ng/L (ref <18)

## 2022-12-27 NOTE — ED Triage Notes (Signed)
Pt. Reports dizziness and short of breath with weakness since last week.  Pt. Reports she is more weak on her L side than on the R side.  Pt. Reports her L arm is hard to lift.  Pt. Can lift the L arm during assessment of Pt. In triage.

## 2022-12-27 NOTE — ED Notes (Signed)
Pt ambulated down the hallway with a tech. No dizziness, no weakness, no assistance. Pt advised she did not eat breakfast and usually eats 3x meals a day, so feels a little drained compared to normal.

## 2022-12-27 NOTE — ED Notes (Signed)
Discharge paperwork reviewed entirely with patient, including follow up care. Pain was under control. No prescriptions were called in, but all questions were addressed.  Pt verbalized understanding as well as all parties involved. No questions or concerns voiced at the time of discharge. No acute distress noted.   Pt ambulated out to PVA without incident or assistance.  Pt advised they will seek followup care with a specialist and followup with their PCP.

## 2022-12-27 NOTE — Discharge Instructions (Addendum)
Your workup today was reassuring overall.  Please follow-up with your cardiologist.  I did place a consult so you may receive a call in the next few days for follow-up.  If you feel worse please return to the emergency department or go to [facility] or [facility] emergency department as they will likely admit you if her symptoms are worsening.

## 2022-12-27 NOTE — ED Notes (Signed)
Ambulated pt from room 1 in front of the nurses station to room 12 and back. Pt states she has no pain or sob only some weakness. Pt does walk with a cane while at home. And she does live in a 2 story home with 15 steps up stairs. Pt states she only ate lunch today. Pt walked with a steady gate.

## 2022-12-27 NOTE — ED Provider Notes (Signed)
[facility] Provider Note   CSN: [id number] Arrival date & time: 12/27/22  1432     History  Chief Complaint  Patient presents with   Dizziness    [name] is a 89 y.o. female.  89 year old female with past medical history of diabetes, hypertension, CHF, coronary artery disease, and atrial fibrillation on Xarelto presenting to the emergency department today with dizziness.  The patient states that she has been having lightheadedness as well as some gait instability now for the past week.  She states this is more of a lightheadedness.  She states that she does feel little off balance with her cane which she normally uses at baseline.  The patient reports that she has been having some left-sided weakness as well.  She reports that this been going now for 1 month.  She states that she is feeling a little weaker on the left side in the arm and leg.  She denies any associated chest pain with this.  She is on Xarelto.  Denies any blood in her stool or dark stools.  She states that her nephrologist recently started her on iron supplements for anemia.  She states that she also saw her cardiologist a few weeks ago and had her Lasix increased from 20 mg daily to 40 mg daily.  Is unsure if her symptoms started after this or not.   Dizziness      Home Medications Prior to Admission medications   Medication Sig Start Date End Date Taking? Authorizing Provider  conjugated estrogens (PREMARIN) vaginal cream Place 1 Applicatorful vaginally daily. 10/30/22  Yes Melene Plan, DO  hydrocortisone (ANUSOL-HC) 2.5 % rectal cream Place 1 Application rectally 2 (two) times daily. 10/12/21  Yes Jacalyn Lefevre, MD  albuterol (VENTOLIN HFA) 108 (90 Base) MCG/ACT inhaler Inhale 2 puffs into the lungs every 4 (four) hours as needed for wheezing or shortness of breath.    [provider]  alendronate (FOSAMAX) 70 MG tablet Take 70 mg by mouth once a  week. 10/21/22   [provider]  atorvastatin (LIPITOR) 40 MG tablet TAKE 1 TABLET BY MOUTH EVERY DAY 08/25/21   Tonny Bollman, MD  Cholecalciferol (VITAMIN D) 2000 units tablet Take 2,000 Units by mouth daily.    [provider]  diclofenac Sodium (VOLTAREN) 1 % GEL Apply 4 g topically 4 (four) times daily. 10/30/22   Melene Plan, DO  FLOVENT HFA 110 MCG/ACT inhaler TAKE 2 PUFFS BY MOUTH TWICE A DAY 03/21/17   Bobbitt, Heywood Iles, MD  fluticasone [facility]) 50 MCG/ACT nasal spray Place 1 spray into both nostrils as needed. 03/14/21   [provider]  furosemide (LASIX) 40 MG tablet Take 1 tablet (40 mg total) by mouth daily. 12/21/22   Tonny Bollman, MD  gabapentin (NEURONTIN) 100 MG capsule Take 100-200 mg by mouth at bedtime. 06/22/20   [provider]  HYDROcodone-acetaminophen (NORCO/VICODIN) 5-325 MG tablet Take 1 tablet by mouth every 8 (eight) hours as needed for moderate pain or severe pain. 07/21/21   Juanda Chance, NP  ketorolac (ACULAR) 0.5 % ophthalmic solution Place into both eyes daily. 11/02/20   [provider]  latanoprost (XALATAN) 0.005 % ophthalmic solution SMARTSIG:1 Drop(s) In Eye(s) Every Evening 11/17/22   [provider]  liver oil-zinc oxide (DESITIN) 40 % ointment Apply topically as needed for irritation. 09/13/20   Joseph Art, DO  losartan (COZAAR) 25 MG tablet Take 25 mg by mouth daily.  10/23/22   [provider]  metoprolol succinate (TOPROL-XL) 25 MG 24 hr tablet Take 25 mg by mouth daily. 10/23/22   [provider]  metoprolol tartrate (LOPRESSOR) 25 MG tablet TAKE 1 TABLET BY MOUTH TWICE A DAY 09/22/21   Tonny Bollman, MD  pantoprazole (PROTONIX) 40 MG tablet Take 1 tablet (40 mg total) by mouth daily. 07/23/22 07/23/23  Lynann Bologna, DO  permethrin (ELIMITE) 5 % cream Apply topically. 11/13/22   [provider]  Rivaroxaban (XARELTO) 15 MG TABS tablet Take 1 tablet (15 mg total)  by mouth daily with supper. 07/24/22   Lynann Bologna, DO  sucralfate (CARAFATE) 1 g tablet Take 1 tablet (1 g total) by mouth 4 (four) times daily for 7 days. 07/23/22 07/30/22  Lynann Bologna, DO  traMADol (ULTRAM) 50 MG tablet Take 1 tablet (50 mg total) by mouth every 12 (twelve) hours as needed. 11/06/21   Cristie Hem, PA-C      Allergies    Ferumoxytol, Chocolate, Cocoa, Lisinopril, and Peanut oil    Review of Systems   Review of Systems  Neurological:  Positive for dizziness.  All other systems reviewed and are negative.   Physical Exam Updated Vital Signs BP 118/76   Pulse 80   Temp 97.8 F (36.6 C)   Resp 15   Ht 5\' 4"  (1.626 m)   Wt 64.4 kg   SpO2 100%   BMI 24.37 kg/m  Physical Exam Vitals and nursing note reviewed.   Gen: NAD Eyes: PERRL, EOMI HEENT: no oropharyngeal swelling Neck: trachea midline Resp: clear to auscultation bilaterally Card: RRR, no murmurs, rubs, or gallops Abd: nontender, nondistended Extremities: no calf tenderness, no edema Vascular: 2+ radial pulses bilaterally, 2+ DP pulses bilaterally Neuro: NIH stroke scale of 0, no obvious past-pointing but the patient is slower on finger-to-nose testing on the left than the right Skin: no rashes Psyc: acting appropriately   ED Results / Procedures / Treatments   Labs (all labs ordered are listed, but only abnormal results are displayed) Labs Reviewed  BASIC METABOLIC PANEL - Abnormal; Notable for the following components:      Result Value   Glucose, Bld 104 (*)    BUN 38 (*)    Creatinine, Ser 1.42 (*)    GFR, Estimated 36 (*)    All other components within normal limits  CBC - Abnormal; Notable for the following components:   RBC 3.86 (*)    Hemoglobin 10.5 (*)    HCT 32.2 (*)    RDW 18.1 (*)    All other components within normal limits  TROPONIN I (HIGH SENSITIVITY)    EKG None  Radiology DG Chest 1 View  Result Date: 12/27/2022 CLINICAL DATA:  Dizziness. EXAM:  CHEST  1 VIEW COMPARISON:  Radiograph 07/23/2022 FINDINGS: Chronic cardiomegaly, unchanged. Stable mediastinal contours with aortic atherosclerosis. Chronic interstitial coarsening. No focal airspace disease, large pleural effusion or pneumothorax. Left axillary surgical clips. IMPRESSION: Chronic cardiomegaly and interstitial coarsening. No acute findings. Electronically Signed   By: Narda Rutherford M.D.   On: 12/27/2022 19:10   CT Head Wo Contrast  Result Date: 12/27/2022 CLINICAL DATA:  Memory loss.  Dizziness. EXAM: CT HEAD WITHOUT CONTRAST TECHNIQUE: Contiguous axial images were obtained from the base of the skull through the vertex without intravenous contrast. RADIATION DOSE REDUCTION: This exam was performed according to the departmental dose-optimization program which includes automated exposure control, adjustment of the mA and/or kV according to patient  size and/or use of iterative reconstruction technique. COMPARISON:  10/30/2022 FINDINGS: Brain: No intracranial hemorrhage, mass effect, or midline shift. Stable degree of atrophy and chronic small vessel ischemia. Chronic basal gangliar mineralization. No hydrocephalus. The basilar cisterns are patent. No evidence of territorial infarct or acute ischemia. No extra-axial or intracranial fluid collection. Vascular: Atherosclerosis of skullbase vasculature without hyperdense vessel or abnormal calcification. Skull: No fracture or focal lesion. Sinuses/Orbits: No acute findings. Other: None. IMPRESSION: 1. No acute intracranial abnormality. 2. Stable atrophy and chronic small vessel ischemia. Electronically Signed   By: Narda Rutherford M.D.   On: 12/27/2022 19:09    Procedures Procedures    Medications Ordered in ED Medications - No data to display  ED Course/ Medical Decision Making/ A&P                                 Medical Decision Making 89 year old female with past medical history of diabetes, hypertension, CHF, coronary artery  disease, and atrial fibrillation on Xarelto presenting to the emergency department today with dizziness as well as left-sided weakness.  I do not appreciate much objective left-sided weakness here on exam.  Her symptoms have been going on now for quite some time.  She is clearly outside the window for thrombolytics or acute neurointervention at this time even if she has had a small stroke.  I will further evaluate her here with basic labs to evaluate for electrolyte abnormalities, AKI, anemia.  Occupational cardiac monitor here.  Her initial EKG interpreted by me shows atrial fibrillation with RVR with right axis deviation, normal intervals, nonspecific ST-T changes.  Her heart rate has improved to the 90s without any intervention here.  Obtain a CT scan of her head to evaluate for intracranial hemorrhage or mass lesion.  Given these symptoms well as her history of CHF she will require admission.  The patient's work appears reassuring.  Her CT scan shows some chronic changes but no acute findings.  Chest x-ray is stable.  The patient remained stable here on the monitor.  Her heart rate remained in the 80s and 90s.  The patient was ambulatory here and states that she is feeling better.  I did discuss with the patient and her family given her valvular abnormalities dilated right ventricle that we could admit the patient to the hospital although she would probably not be a candidate for any aggressive treatment for this.  We also discussed the risks and benefits of hospitalization including deconditioning and exposure to nosocomial infections as well as viral infections.  She was able to ambulate here for 50 yards without any significant lightheaded or dizziness.  Ultimately, she is discharged through shared decision making.  I have placed a cardiology consult to have her cardiologist reach out to her to see if she would need more urgent follow-up as an outpatient.  She is discharged with return  precautions.  Amount and/or Complexity of Data Reviewed Labs: ordered. Radiology: ordered.           Final Clinical Impression(s) / ED Diagnoses Final diagnoses:  Dizziness    Rx / DC Orders ED Discharge Orders          Ordered    Ambulatory referral to Cardiology       Comments: If you have not heard from the Cardiology office within the next 72 hours please call [phone].   12/27/22 2002  Durwin Glaze, MD 12/27/22 2003

## 2022-12-27 NOTE — ED Notes (Signed)
Patient transported to CT 

## 2023-01-16 LAB — BASIC METABOLIC PANEL
BUN/Creatinine Ratio: 15 (ref 12–28)
BUN: 20 mg/dL (ref 8–27)
CO2: 24 mmol/L (ref 20–29)
Calcium: 9.3 mg/dL (ref 8.7–10.3)
Chloride: 98 mmol/L (ref 96–106)
Creatinine, Ser: 1.32 mg/dL — ABNORMAL HIGH (ref 0.57–1.00)
Glucose: 104 mg/dL — ABNORMAL HIGH (ref 70–99)
Potassium: 4 mmol/L (ref 3.5–5.2)
Sodium: 138 mmol/L (ref 134–144)
eGFR: 39 mL/min/{1.73_m2} — ABNORMAL LOW (ref >59)

## 2023-02-07 HISTORY — PX: CARPAL TUNNEL RELEASE: SHX101

## 2023-03-29 NOTE — Progress Notes (Deleted)
 Cardiology Office Note    Patient Name: [name] Date of Encounter: 03/29/2023  Primary Care Provider:  Estevan Oaks, NP Primary Cardiologist:  Tonny Bollman, MD Primary Electrophysiologist: None   Past Medical History    Past Medical History:  Diagnosis Date   Arthritis    "all over"   Atrial fibrillation [facility])    Diagnosed ~2009   Breast cancer, left breast (HCC) 02/07/1996   S/P chemo and mastectomy    CHF (congestive heart failure) (HCC)    GERD (gastroesophageal reflux disease)    Hyperlipidemia    Hypertension    Left shoulder pain    "MRI showed pinched nerve" - per pt   MI (myocardial infarction) (HCC)    OSA on CPAP    Pneumonia    "4-5 times" (01/06/2015)   Type II diabetes mellitus (HCC)     History of Present Illness  [name] is a 89 y.o. female with a PMH of CAD s/p NSTEMI 07/2020 with nonobstructive disease, persistent A-fib (on Xarelto), HTN, HLD, OSA (on CPAP), DM type II, HFpEF, breast CA s/p mastectomy, arthritis who presents today for 25-month follow-up.  [name] was seen initially in 2013 due to complaint of shoulder pain and atrial fibrillation.  She was previously diagnosed 4 years prior and placed on metoprolol and Multaq.  She was manage 06/2020 with chest pain and EKG was completed showing possible inferior injury did not meet STEMI criteria.  Her troponins peaked at 3872 and patient underwent LHC that showed nonobstructive disease.  She underwent 2D echo that showed EF of 55 to 60% with mildly reduced, mildly elevated PASP, severely enlarged right ventricle, LA moderately dilated, RA severely dilated, mild MR, mild dilation of ascending aorta 40 mm.  She also experienced sinus pauses that were less than 3 seconds and asymptomatic.  She completed an echocardiogram in 11/2022 that showed reduced RV function with severe dilated RV and severe TR, severely dilated LA and massively dilated RA, with torrential tricuspid  regurgitation and mild calcification of AVR.  She is currently followed by Dr. Excell Seltzer and was last seen on 11/27/2022 for follow-up.  Visit she reported some fatigue and shortness of breath and occasional abdominal swelling with early satiety.  During visit she had amlodipine switched to spironolactone due to severe TR and decision made to treat medically in the setting of advanced age and stable renal function.  During today's visit the patient reports*** .  Patient denies chest pain, palpitations, dyspnea, PND, orthopnea, nausea, vomiting, dizziness, syncope, edema, weight gain, or early satiety.  ***Notes: -Last ischemic evaluation: -Last echo: -Interim ED visits: Review of Systems  Please see the history of present illness.    All other systems reviewed and are otherwise negative except as noted above.  Physical Exam    Wt Readings from Last 3 Encounters:  12/27/22 142 lb (64.4 kg)  11/27/22 142 lb 3.2 oz (64.5 kg)  10/30/22 143 lb (64.9 kg)   ZO:[id number] were no vitals filed for this visit.,There is no height or weight on file to calculate BMI. GEN: Well nourished, well developed in no acute distress Neck: No JVD; No carotid bruits Pulmonary: Clear to auscultation without rales, wheezing or rhonchi  Cardiovascular: Normal rate. Regular rhythm. Normal S1. Normal S2.   Murmurs: There is no murmur.  ABDOMEN: Soft, non-tender, non-distended EXTREMITIES:  No edema; No deformity   EKG/LABS/ Recent Cardiac Studies   ECG personally reviewed by me today - ***  Risk Assessment/Calculations:   {  Does this patient have ATRIAL FIBRILLATION?[phone]}      Lab Results  Component Value Date   WBC 7.2 12/27/2022   HGB 10.5 (L) 12/27/2022   HCT 32.2 (L) 12/27/2022   MCV 83.4 12/27/2022   PLT 281 12/27/2022   Lab Results  Component Value Date   CREATININE 1.32 (H) 01/15/2023   BUN 20 01/15/2023   NA 138 01/15/2023   K 4.0 01/15/2023   CL 98 01/15/2023   CO2 24 01/15/2023   Lab  Results  Component Value Date   CHOL 148 07/04/2020   HDL 53 07/04/2020   LDLCALC 85 07/04/2020   LDLDIRECT 46 12/22/2020   TRIG 52 07/04/2020   CHOLHDL 2.8 07/04/2020    Lab Results  Component Value Date   HGBA1C 5.9 (H) 09/12/2020   Assessment & Plan    1. HFmrEF: -Most recent 2D echo completed 11/2022 showing dilated RV and severe TR with recommendation to pursue medical therapy due to advanced age  64.  Torrential TR: -Patient is currently*** -Continue***  3.  Persistent AF: -Patient currently on  4.  Essential hypertension: -Patient's blood pressure today was***     Disposition: Follow-up with Tonny Bollman, MD or APP in *** months {Are you ordering a CV Procedure (e.g. stress test, cath, DCCV, TEE, etc)?   Press F2        :[id number]}   Signed, Napoleon Form, Leodis Rains, NP 03/29/2023, 5:02 PM [facility]

## 2023-03-30 ENCOUNTER — Ambulatory Visit: Payer: 59 | Admitting: Nurse Practitioner

## 2023-03-30 DIAGNOSIS — I071 Rheumatic tricuspid insufficiency: Secondary | ICD-10-CM

## 2023-03-30 DIAGNOSIS — I4819 Other persistent atrial fibrillation: Secondary | ICD-10-CM

## 2023-03-30 DIAGNOSIS — I10 Essential (primary) hypertension: Secondary | ICD-10-CM

## 2023-03-30 DIAGNOSIS — I5022 Chronic systolic (congestive) heart failure: Secondary | ICD-10-CM

## 2023-05-15 NOTE — Progress Notes (Deleted)
  Cardiology Office Note    Patient Name: [name] Date of Encounter: 05/15/2023  Primary Care Provider:  Estevan Oaks, NP Primary Cardiologist:  Tonny Bollman, MD Primary Electrophysiologist: None   Past Medical History    Past Medical History:  Diagnosis Date   Arthritis    "all over"   Atrial fibrillation [facility])    Diagnosed ~2009   Breast cancer, left breast (HCC) 02/07/1996   S/P chemo and mastectomy    CHF (congestive heart failure) (HCC)    GERD (gastroesophageal reflux disease)    Hyperlipidemia    Hypertension    Left shoulder pain    "MRI showed pinched nerve" - per pt   MI (myocardial infarction) (HCC)    OSA on CPAP    Pneumonia    "4-5 times" (01/06/2015)   Type II diabetes mellitus (HCC)     History of Present Illness  Cassandra BRIANNAH Barker is a 89 y.o. female with a PMH of***    Patient denies chest pain, palpitations, dyspnea, PND, orthopnea, nausea, vomiting, dizziness, syncope, edema, weight gain, or early satiety.   Discussed the use of AI scribe software for clinical note transcription with the patient, who gave verbal consent to proceed.  History of Present Illness    ***Notes: -Last ischemic evaluation:  Review of Systems  Please see the history of present illness.    All other systems reviewed and are otherwise negative except as noted above.  Physical Exam    Wt Readings from Last 3 Encounters:  12/27/22 142 lb (64.4 kg)  11/27/22 142 lb 3.2 oz (64.5 kg)  10/30/22 143 lb (64.9 kg)   VQ:[id number] were no vitals filed for this visit.,There is no height or weight on file to calculate BMI. GEN: Well nourished, well developed in no acute distress Neck: No JVD; No carotid bruits Pulmonary: Clear to auscultation without rales, wheezing or rhonchi  Cardiovascular: Normal rate. Regular rhythm. Normal S1. Normal S2.   Murmurs: There is no murmur.  ABDOMEN: Soft, non-tender, non-distended EXTREMITIES:  No edema; No deformity    EKG/LABS/ Recent Cardiac Studies   ECG personally reviewed by me today - ***  Risk Assessment/Calculations:   {Does this patient have ATRIAL FIBRILLATION?:[phone]}      Lab Results  Component Value Date   WBC 7.2 12/27/2022   HGB 10.5 (L) 12/27/2022   HCT 32.2 (L) 12/27/2022   MCV 83.4 12/27/2022   PLT 281 12/27/2022   Lab Results  Component Value Date   CREATININE 1.32 (H) 01/15/2023   BUN 20 01/15/2023   NA 138 01/15/2023   K 4.0 01/15/2023   CL 98 01/15/2023   CO2 24 01/15/2023   Lab Results  Component Value Date   CHOL 148 07/04/2020   HDL 53 07/04/2020   LDLCALC 85 07/04/2020   LDLDIRECT 46 12/22/2020   TRIG 52 07/04/2020   CHOLHDL 2.8 07/04/2020    Lab Results  Component Value Date   HGBA1C 5.9 (H) 09/12/2020   Assessment & Plan    1.***  2.***  3.***  4.***      Disposition: Follow-up with Tonny Bollman, MD or APP in *** months {Are you ordering a CV Procedure (e.g. stress test, cath, DCCV, TEE, etc)?   Press F2        :[id number]}   Signed, Napoleon Form, Leodis Rains, NP 05/15/2023, 7:29 PM  [facility]

## 2023-05-16 ENCOUNTER — Ambulatory Visit: Payer: 59 | Attending: Nurse Practitioner | Admitting: Nurse Practitioner

## 2023-06-16 ENCOUNTER — Other Ambulatory Visit: Payer: Self-pay | Admitting: Cardiovascular Disease

## 2023-06-18 ENCOUNTER — Telehealth: Payer: Self-pay | Admitting: Physical Medicine and Rehabilitation

## 2023-06-18 NOTE — Telephone Encounter (Signed)
 Pt request appt for another injection, last injection was on 11/24/2021

## 2023-06-27 ENCOUNTER — Ambulatory Visit (INDEPENDENT_AMBULATORY_CARE_PROVIDER_SITE_OTHER): Admitting: Physical Medicine and Rehabilitation

## 2023-06-27 ENCOUNTER — Encounter: Payer: Self-pay | Admitting: Physical Medicine and Rehabilitation

## 2023-06-27 VITALS — BP 117/73 | HR 80

## 2023-06-27 DIAGNOSIS — M4802 Spinal stenosis, cervical region: Secondary | ICD-10-CM | POA: Diagnosis not present

## 2023-06-27 DIAGNOSIS — M5412 Radiculopathy, cervical region: Secondary | ICD-10-CM | POA: Diagnosis not present

## 2023-06-27 NOTE — Progress Notes (Unsigned)
 Patient says that she is having pain across her upper back, her neck, and her left arm. She is having difficulty lifting her left arm due to pain. She says that she will have burning in both arms, as well as numbness in both hands. She says that heat helps her pain, and she is taking Tramadol .

## 2023-06-27 NOTE — Progress Notes (Addendum)
 [name] - 89 y.o. female MRN [id number]  Date of birth: Apr 16, 1933  Office Visit Note: Visit Date: 06/27/2023 PCP: Delores Rojelio Caldron, NP Referred by: Delores Rojelio Caldron, NP  Subjective: No chief complaint on file.  HPI: [name] is a 89 y.o. female who comes in today for evaluation of chronic, worsening and severe bilateral neck pain radiating to both shoulders and down left arm. Paresthesias to bilateral arms. She was last seen in our office in 2023. Patient is somewhat of a poor historian, she is here alone today. Her pain worsens when laying down to sleep. She describes pain as sore and aching sensation, currently rates 7 out of 10. Some relief of pain with home exercise regimen, rest and use of medications. She does take Tramadol  as needed that is prescribed by her primary care provider. Cervical MRI imaging from 2023 shows severe spinal canal stenosis at C3-4 and moderate spinal canal stenosis at C4-5. There is multilevel moderate to severe neural foraminal stenosis, worst at C3-4 and C7-T1. History of left T1-T2 interlaminar epidural steroid injection in our office on 11/24/2021. She reports significant relief of pain, greater than 80% relief for greater than 6 months. She is currently using cane to assist with ambulation. Patient denies focal weakness. No recent trauma or falls.       Review of Systems  Musculoskeletal:  Positive for neck pain.  Neurological:  Positive for tingling. Negative for focal weakness and weakness.  All other systems reviewed and are negative.  Otherwise per HPI.  Assessment & Plan: Visit Diagnoses:    ICD-10-CM   1. Cervical radiculopathy  M54.12 Ambulatory referral to Physical Medicine Rehab    2. Foraminal stenosis of cervical region  M48.02 Ambulatory referral to Physical Medicine Rehab    3. Spinal stenosis of cervical region  M48.02 Ambulatory referral to Physical Medicine Rehab       Plan: Findings:  Chronic, worsening  and severe bilateral neck pain radiating to both shoulders and down left arm. Paresthesias to bilateral arms. Patient continues to have severe pain despite good conservative therapies such as home exercise regimen, rest and use of medications. Patients clinical presentation and exam are consistent with cervical radiculopathy. There is severe canal stenosis at C3-C4. We discussed treatment plan in detail today. Next step is to perform diagnostic and hopefully therapeutic left T1-T2 interlaminar epidural steroid injection under fluoroscopic guidance. If good relief of pain we can repeat this procedure infrequently as needed. She is currently taking Xarelto , we will need to get permission for her to come off prior to injection. I discussed injection procedure with her today in detail, she has no questions at this time. No red flag symptoms noted upon exam today.     Meds & Orders: No orders of the defined types were placed in this encounter.   Orders Placed This Encounter  Procedures   Ambulatory referral to Physical Medicine Rehab    Follow-up: Return for Left T1-T2 interlaminar epidural steroid injection.   Procedures: No procedures performed      Clinical History: EXAM: MRI CERVICAL SPINE WITHOUT CONTRAST   TECHNIQUE: Multiplanar, multisequence MR imaging of the cervical spine was performed. No intravenous contrast was administered.   COMPARISON:  None Available.   FINDINGS: Alignment: Grade 1 anterolisthesis at C5-6   Vertebrae: No fracture, evidence of discitis, or bone lesion.   Cord: Normal signal and morphology.   Posterior Fossa, vertebral arteries, paraspinal tissues: Negative.   Disc levels:  C1-2: Unremarkable.   C2-3: Small disc bulge with bilateral uncovertebral hypertrophy. No spinal canal stenosis. Moderate bilateral neural foraminal stenosis.   C3-4: Medium-sized disc bulge with large uncovertebral osteophytes. Severe spinal canal stenosis. Severe bilateral  neural foraminal stenosis.   C4-5: Small disc bulge with uncovertebral hypertrophy. Moderate spinal canal stenosis. Moderate right and mild left neural foraminal stenosis.   C5-6: Small disc bulge with moderate bilateral uncovertebral hypertrophy. There is no spinal canal stenosis. Moderate bilateral neural foraminal stenosis.   C6-7: Small disc bulge. There is no spinal canal stenosis. Mild left neural foraminal stenosis.   C7-T1: Medium-sized disc bulge with uncovertebral hypertrophy. Mild spinal canal stenosis. Severe bilateral neural foraminal stenosis.   IMPRESSION: 1. Multilevel cervical degenerative disc disease with severe spinal canal stenosis at C3-4 and moderate spinal canal stenosis at C4-5. 2. Multilevel moderate to severe neural foraminal stenosis, worst at C3-4 and C7-T1.     Electronically Signed   By: Franky Stanford M.D.   On: 06/22/2021 00:11   She reports that she has never smoked. Her smokeless tobacco use includes snuff. No results for input(s): HGBA1C, LABURIC in the last 8760 hours.  Objective:  VS:  HT:    WT:   BMI:     BP:117/73  HR:80bpm  TEMP: ( )  RESP:  Physical Exam Vitals and nursing note reviewed.  HENT:     Head: Normocephalic and atraumatic.     Right Ear: External ear normal.     Left Ear: External ear normal.     Nose: Nose normal.     Mouth/Throat:     Mouth: Mucous membranes are moist.  Eyes:     Extraocular Movements: Extraocular movements intact.  Cardiovascular:     Rate and Rhythm: Normal rate.     Pulses: Normal pulses.  Pulmonary:     Effort: Pulmonary effort is normal.  Abdominal:     General: Abdomen is flat. There is no distension.  Musculoskeletal:        General: Tenderness present.     Cervical back: Tenderness present.     Comments: No discomfort noted with flexion, extension and side-to-side rotation. Patient has good strength in the upper extremities including 5 out of 5 strength in wrist extension,  long finger flexion and APB. Shoulder range of motion is full bilaterally without any sign of impingement. Atrophy noted to bilateral hands. Sensation intact bilaterally. Negative Hoffman's sign. Negative Spurling's sign.     Skin:    General: Skin is warm and dry.     Capillary Refill: Capillary refill takes less than 2 seconds.  Neurological:     Mental Status: She is alert and oriented to person, place, and time.     Gait: Gait abnormal.  Psychiatric:        Mood and Affect: Mood normal.        Behavior: Behavior normal.     Ortho Exam  Imaging: No results found.  Past Medical/Family/Surgical/Social History: Medications & Allergies reviewed per EMR, new medications updated. Patient Active Problem List   Diagnosis Date Noted   CHF exacerbation (HCC) 09/12/2020   Hypomagnesemia 09/12/2020   Acute on chronic diastolic CHF (congestive heart failure) (HCC) 09/11/2020   OSA on CPAP    CKD (chronic kidney disease), stage III (HCC)    Anemia of chronic disease    Hypokalemia    Malnutrition of moderate degree 07/09/2020   Chest pain of uncertain etiology    Unstable angina (HCC) 07/03/2020  Primary osteoarthritis, right ankle and foot 01/24/2018   Arthritis of right ankle 08/21/2017   Allergic urticaria 09/07/2016   Generalized pruritus 09/07/2016   Moderate persistent asthma 09/07/2016   Perennial and seasonal allergic rhinitis 09/07/2016   Chest pain 01/06/2015   Angina at rest [facility]) 01/06/2015   Diabetes (HCC) 01/06/2015   Persistent atrial fibrillation (HCC) 01/06/2015   HLD (hyperlipidemia) 01/06/2015   Tobacco abuse 01/06/2015   Type II diabetes mellitus (HCC)    Painful total knee replacement (HCC) 01/28/2013   Atrial fibrillation (HCC)    Left shoulder pain    Hypertension    Past Medical History:  Diagnosis Date   Arthritis    all over   Atrial fibrillation (HCC)    Diagnosed ~2009   Breast cancer, left breast (HCC) 02/07/1996   S/P chemo and mastectomy     CHF (congestive heart failure) (HCC)    GERD (gastroesophageal reflux disease)    Hyperlipidemia    Hypertension    Left shoulder pain    MRI showed pinched nerve - per pt   MI (myocardial infarction) (HCC)    OSA on CPAP    Pneumonia    4-5 times (01/06/2015)   Type II diabetes mellitus (HCC)    Family History  Problem Relation Age of Onset   Heart attack Mother        58s   Emphysema Brother    Allergic rhinitis Neg Hx    Angioedema Neg Hx    Asthma Neg Hx    Eczema Neg Hx    Immunodeficiency Neg Hx    Urticaria Neg Hx    Past Surgical History:  Procedure Laterality Date   BIOPSY  07/23/2022   Procedure: BIOPSY;  Surgeon: Kriss Estefana DEL, DO;  Location: [facility];  Service: Gastroenterology;;   BREAST BIOPSY Left 1998   CATARACT EXTRACTION W/ INTRAOCULAR LENS  IMPLANT, BILATERAL Bilateral    ESOPHAGOGASTRODUODENOSCOPY N/A 07/23/2022   Procedure: ESOPHAGOGASTRODUODENOSCOPY (EGD);  Surgeon: Kriss Estefana DEL, DO;  Location: [facility];  Service: Gastroenterology;  Laterality: N/A;   JOINT REPLACEMENT     LAPAROSCOPIC CHOLECYSTECTOMY     LEFT HEART CATH AND CORONARY ANGIOGRAPHY N/A 07/07/2020   Procedure: LEFT HEART CATH AND CORONARY ANGIOGRAPHY;  Surgeon: Dann Candyce RAMAN, MD;  Location: [facility] CV LAB;  Service: Cardiovascular;  Laterality: N/A;   MASTECTOMY Left 1998   TOTAL KNEE ARTHROPLASTY Left 2001   TOTAL KNEE REVISION Left 01/28/2013   Procedure: LEFT TOTAL KNEE ARTHROPLASTY REVISION;  Surgeon: Kay Ozell Cummins, MD;  Location: [facility] OR;  Service: Orthopedics;  Laterality: Left;   Social History   Occupational History   Not on file  Tobacco Use   Smoking status: Never   Smokeless tobacco: Current    Types: Snuff  Vaping Use   Vaping status: Never Used  Substance and Sexual Activity   Alcohol use: Not Currently    Comment: 01/06/2015 I'll have a beer q once in awhile   Drug use: No   Sexual activity: Never

## 2023-07-09 NOTE — Telephone Encounter (Signed)
 Pt called to f/u on this request. Best call back number [phone]

## 2023-07-10 ENCOUNTER — Ambulatory Visit: Admitting: Physical Medicine and Rehabilitation

## 2023-07-18 ENCOUNTER — Ambulatory Visit: Admitting: Physical Medicine and Rehabilitation

## 2023-07-19 ENCOUNTER — Telehealth: Payer: Self-pay | Admitting: Physical Medicine and Rehabilitation

## 2023-07-19 ENCOUNTER — Telehealth: Payer: Self-pay

## 2023-07-19 NOTE — Telephone Encounter (Signed)
 Pt son is requesting a call to discuss some misunderstood information. Best call back [phone]

## 2023-07-19 NOTE — Telephone Encounter (Signed)
   Pre-operative Risk Assessment    Patient Name: [name]  DOB: 02/18/1933 MRN: [id number]   Date of last office visit: 11/27/22 Arnoldo Lapping, MD Date of next office visit: NONE   Request for Surgical Clearance    Procedure:  [facility] STEROID INJECTION  Date of Surgery:  Clearance TBD                                Surgeon:  DR Nettie Barb Group or Practice Name:  [facility] Phone number:  [phone] Fax number:  [phone]  ATTN: DR Daisey Dryer SCHEDULING   Type of Clearance Requested:   - Medical  - Pharmacy:  Hold Rivaroxaban  (Xarelto ) 2 DAYS PRIOR   Type of Anesthesia:  Not Indicated   Additional requests/questions:    Signed, Collin Deal   07/19/2023, 4:38 PM

## 2023-07-23 ENCOUNTER — Telehealth: Payer: Self-pay

## 2023-07-23 NOTE — Telephone Encounter (Signed)
 Patient's son called today to verify if she has permission to hold Xarelto . Auth is pending PharmD review from [facility]. Son was notified once we receive clearance we will schedule.

## 2023-07-25 NOTE — Telephone Encounter (Signed)
 Called pt to schedule [facility] for preop clearance. Unable to reach pt. Mailbox not setup.

## 2023-07-25 NOTE — Telephone Encounter (Signed)
 Patient with diagnosis of A Fib on Xarelto  for anticoagulation.    Procedure: SPIDURAL STEROID INJECTION  Date of procedure: TBD   CHA2DS2-VASc Score = 6  This indicates a 9.7% annual risk of stroke. The patient's score is based upon: CHF History: 1 HTN History: 1 Diabetes History: 0 Stroke History: 0 Vascular Disease History: 1 Age Score: 2 Gender Score: 1    CrCl 31 ml/min Platelet count 281K  Per office protocol, patient can hold Xarelto  for 3 days prior to procedure.     **This guidance is not considered finalized until pre-operative APP has relayed final recommendations.**

## 2023-07-25 NOTE — Telephone Encounter (Signed)
   Name: [name]  DOB: Jun 29, 1933  MRN: [id number]  Primary Cardiologist: Arnoldo Lapping, MD  Chart reviewed as part of pre-operative protocol coverage. Because of [name]'s past medical history and time since last visit, she will require a follow-up in-office visit in order to better assess preoperative cardiovascular risk.  Pre-op covering staff: - Please schedule appointment and call patient to inform them. If patient already had an upcoming appointment within acceptable timeframe, please add pre-op clearance to the appointment notes so provider is aware. - Please contact requesting surgeon's office via preferred method (i.e, phone, fax) to inform them of need for appointment prior to surgery.    Warren Haber Deveon Kisiel, PA  07/25/2023, 11:04 AM

## 2023-07-26 NOTE — Telephone Encounter (Signed)
 Called and spoke to patient's daughter who scheduled office visit for patient

## 2023-08-01 NOTE — Progress Notes (Unsigned)
 Cardiology Office Note    Patient Name: [name] Date of Encounter: 08/01/2023  Primary Care Provider:  Delores Rojelio Caldron, NP Primary Cardiologist:  Ozell Fell, MD Primary Electrophysiologist: None   Past Medical History    Past Medical History:  Diagnosis Date   Arthritis    all over   Atrial fibrillation [facility])    Diagnosed ~2009   Breast cancer, left breast (HCC) 02/07/1996   S/P chemo and mastectomy    CHF (congestive heart failure) (HCC)    GERD (gastroesophageal reflux disease)    Hyperlipidemia    Hypertension    Left shoulder pain    MRI showed pinched nerve - per pt   MI (myocardial infarction) (HCC)    OSA on CPAP    Pneumonia    4-5 times (01/06/2015)   Type II diabetes mellitus (HCC)     History of Present Illness   [name] is a 89 y.o. female with a PMH of CAD s/p NSTEMI 07/2020 with nonobstructive disease, persistent A-fib (on Xarelto ), HTN, HLD, OSA (on CPAP), DM type II, HFpEF, breast CA s/p mastectomy, arthritis who presents today for preoperative clearance.   [name] was seen initially in 2013 due to complaint of shoulder pain and atrial fibrillation.  She was previously diagnosed 4 years prior and placed on metoprolol  and Multaq.  She was manage 06/2020 with chest pain and EKG was completed showing possible inferior injury did not meet STEMI criteria.  Her troponins peaked at 3872 and patient underwent LHC that showed nonobstructive disease.  She underwent 2D echo that showed EF of 55 to 60% with mildly reduced, mildly elevated PASP, severely enlarged right ventricle, LA moderately dilated, RA severely dilated, mild MR, mild dilation of ascending aorta 40 mm.  She also experienced sinus pauses that were less than 3 seconds and asymptomatic.  She completed an echocardiogram in 11/2022 that showed reduced RV function with severe dilated RV and severe TR, severely dilated LA and massively dilated RA, with torrential tricuspid  regurgitation and mild calcification of AVR.  She is currently followed by Dr. Fell and was last seen on 11/27/2022 for follow-up.  Visit she reported some fatigue and shortness of breath and occasional abdominal swelling with early satiety.  During visit she had amlodipine  switched to spironolactone  due to severe TR and decision made to treat medically in the setting of advanced age and stable renal function.  [name] presents today with her daughter and granddaughter for preoperative clearance. She has a history of atrial fibrillation since 2013 and is currently on Xarelto , a blood thinner, without issues of bleeding. Her EKG today shows continued atrial fibrillation, and she has not experienced any recent episodes of rapid heart rate. Previously, extra beats were noted on an EKG.  In October, she experienced fatigue, shortness of breath, occasional stomach swelling, and a feeling of fullness, which she attributes to acid reflux. She is on medication for acid reflux, which has improved her symptoms. Her blood pressure medication was switched from amlodipine  to spironolactone , but she had issues tolerating spironolactone , leading to an increase in Lasix  to 40 mg. She reports no recent swelling in her ankles and takes a water pill once a day. She experiences numbness and itching in her hands, particularly at the ends of her fingers, and has difficulty lifting her arm. She has been referred to physical therapy and has received cortisone shots in her neck for a pinched nerve. She uses a cane or walker for  mobility and can push a cart in the grocery store but cannot perform household chores due to physical limitations. Patient denies chest pain, palpitations, dyspnea, PND, orthopnea, nausea, vomiting, dizziness, syncope, edema, weight gain, or early satiety.  Discussed the use of AI scribe software for clinical note transcription with the patient, who gave verbal consent to proceed.  History of Present  Illness   Review of Systems  Please see the history of present illness.    All other systems reviewed and are otherwise negative except as noted above.  Physical Exam    Wt Readings from Last 3 Encounters:  12/27/22 142 lb (64.4 kg)  11/27/22 142 lb 3.2 oz (64.5 kg)  10/30/22 143 lb (64.9 kg)   CD:Uyzmz were no vitals filed for this visit.,There is no height or weight on file to calculate BMI. GEN: Well nourished, well developed in no acute distress Neck: No JVD; No carotid bruits Pulmonary: Clear to auscultation without rales, wheezing or rhonchi  Cardiovascular: Irregularly irregular normal S1. Normal S2.   Murmurs: There is no murmur.  ABDOMEN: Soft, non-tender, non-distended EXTREMITIES:  No edema; No deformity   EKG/LABS/ Recent Cardiac Studies   ECG personally reviewed by me today -atrial fibrillation with rate of 75 bpm and right axis deviation with no acute changes consistent with previous EKG.  Risk Assessment/Calculations:    CHA2DS2-VASc Score = 6   This indicates a 9.7% annual risk of stroke. The patient's score is based upon: CHF History: 1 HTN History: 1 Diabetes History: 0 Stroke History: 0 Vascular Disease History: 1 Age Score: 2 Gender Score: 1         Lab Results  Component Value Date   WBC 7.2 12/27/2022   HGB 10.5 (L) 12/27/2022   HCT 32.2 (L) 12/27/2022   MCV 83.4 12/27/2022   PLT 281 12/27/2022   Lab Results  Component Value Date   CREATININE 1.32 (H) 01/15/2023   BUN 20 01/15/2023   NA 138 01/15/2023   K 4.0 01/15/2023   CL 98 01/15/2023   CO2 24 01/15/2023   Lab Results  Component Value Date   CHOL 148 07/04/2020   HDL 53 07/04/2020   LDLCALC 85 07/04/2020   LDLDIRECT 46 12/22/2020   TRIG 52 07/04/2020   CHOLHDL 2.8 07/04/2020    Lab Results  Component Value Date   HGBA1C 5.9 (H) 09/12/2020   Assessment & Plan    Assessment and Plan Assessment & Plan   1.HFmrEF: -Most recent 2D echo completed 11/2022 showing  dilated RV and severe TR with recommendation to pursue medical therapy due to advanced age -Patient is euvolemic on exam and denies any shortness of breath with exertion. -Continue metoprolol  to tartrate 25 mg twice daily, Lasix  40 mg daily, losartan 25 mg -Low sodium diet, fluid restriction <2L, and daily weights encouraged. Educated to contact our office for weight gain of 2 lbs overnight or 5 lbs in one week.    2.  Torrential TR: -Previous 2D echo completed 11/16/2022 showing dilated RV and severe TR recommendation for 2 medical therapy -Patient is currently asymptomatic with no shortness of breath at rest. -Continue Lasix  40 mg daily   3.  Persistent AF: - Patient currently rate controlled by EKG at 75 bpm -Continue metoprolol  tartrate 25 mg twice daily -Patient's creatinine clearance is 33 mL/min -Continue Xarelto  15 mg daily   4.  Essential hypertension: -Patient's blood pressure today was well-controlled at 118/74 - Continue losartan 25 mg daily metoprolol  tartrate 25  mg twice daily  5.  Preoperative clearance: - Patient's RCRI score 6.6% -The patient affirms she has been doing well without any new cardiac symptoms. They are able to achieve 4 METS without cardiac limitations. Therefore, based on ACC/AHA guidelines, the patient would be at acceptable risk for the planned procedure without further cardiovascular testing. The patient was advised that if she develops new symptoms prior to surgery to contact our office to arrange for a follow-up visit, and she verbalized understanding.   Patient can hold Xarelto  3 days prior to procedure and should restart postprocedure when surgically safe and hemostasis is achieved.  Disposition: Follow-up with Ozell Fell, MD or APP in 6 months    Signed, Wyn Raddle, Jackee Shove, NP 08/01/2023, 3:42 PM [facility]

## 2023-08-02 ENCOUNTER — Encounter: Payer: Self-pay | Admitting: Nurse Practitioner

## 2023-08-02 ENCOUNTER — Ambulatory Visit: Attending: Nurse Practitioner | Admitting: Nurse Practitioner

## 2023-08-02 VITALS — BP 118/74 | HR 83 | Ht 64.0 in | Wt 134.0 lb

## 2023-08-02 DIAGNOSIS — I4819 Other persistent atrial fibrillation: Secondary | ICD-10-CM

## 2023-08-02 DIAGNOSIS — I5022 Chronic systolic (congestive) heart failure: Secondary | ICD-10-CM

## 2023-08-02 DIAGNOSIS — Z01810 Encounter for preprocedural cardiovascular examination: Secondary | ICD-10-CM | POA: Diagnosis not present

## 2023-08-02 DIAGNOSIS — I071 Rheumatic tricuspid insufficiency: Secondary | ICD-10-CM

## 2023-08-02 DIAGNOSIS — I10 Essential (primary) hypertension: Secondary | ICD-10-CM

## 2023-08-02 NOTE — Patient Instructions (Addendum)
 Medication Instructions:  Your physician recommends that you continue on your current medications as directed. Please refer to the Current Medication list given to you today. *If you need a refill on your cardiac medications before your next appointment, please call your pharmacy*  Lab Work: None ordered If you have labs (blood work) drawn today and your tests are completely normal, you will receive your results only by: MyChart Message (if you have MyChart) OR A paper copy in the mail If you have any lab test that is abnormal or we need to change your treatment, we will call you to review the results.  Testing/Procedures: None ordered  Follow-Up: At [facility], you and your health needs are our priority.  As part of our continuing mission to provide you with exceptional heart care, our providers are all part of one team.  This team includes your primary Cardiologist (physician) and Advanced Practice Providers or APPs (Physician Assistants and Nurse Practitioners) who all work together to provide you with the care you need, when you need it.  Your next appointment:   6 month(s)  Provider:   Ozell Fell, MD    We recommend signing up for the patient portal called MyChart.  Sign up information is provided on this After Visit Summary.  MyChart is used to connect with patients for Virtual Visits (Telemedicine).  Patients are able to view lab/test results, encounter notes, upcoming appointments, etc.  Non-urgent messages can be sent to your provider as well.   To learn more about what you can do with MyChart, go to [email or web address].   Other Instructions You have been cleared for your procedure.

## 2023-08-20 ENCOUNTER — Ambulatory Visit (INDEPENDENT_AMBULATORY_CARE_PROVIDER_SITE_OTHER): Admitting: Physical Medicine and Rehabilitation

## 2023-08-20 ENCOUNTER — Other Ambulatory Visit: Payer: Self-pay

## 2023-08-20 VITALS — BP 162/72 | HR 90

## 2023-08-20 DIAGNOSIS — M5412 Radiculopathy, cervical region: Secondary | ICD-10-CM | POA: Diagnosis not present

## 2023-08-20 MED ORDER — METHYLPREDNISOLONE ACETATE 40 MG/ML IJ SUSP
40.0000 mg | Freq: Once | INTRAMUSCULAR | Status: AC
  Administered 2023-08-20: 40 mg

## 2023-08-20 NOTE — Patient Instructions (Signed)

## 2023-08-20 NOTE — Progress Notes (Signed)
 Pain Scale   Average Pain 9 Patient advising she has chronic spine pain from neck to lower back. Patient advising she never get relief during the day her pain increases.        +Driver, -BT, -Dye Allergies.

## 2023-08-20 NOTE — Progress Notes (Addendum)
 [name] - 89 y.o. female MRN [id number]  Date of birth: 09-26-1933  Office Visit Note: Visit Date: 08/20/2023 PCP: Delores Rojelio Caldron, NP Referred by: Delores Rojelio Caldron, NP  Subjective: Chief Complaint  Patient presents with   Middle Back - Pain   HPI:  [name] is a 89 y.o. female who comes in today at the request of Duwaine Pouch, FNP for planned Right T1-2 Cervical Interlaminar epidural steroid injection with fluoroscopic guidance.  The patient has failed conservative care including home exercise, medications, time and activity modification.  This injection will be diagnostic and hopefully therapeutic.  Please see requesting physician notes for further details and justification.   ROS Otherwise per HPI.  Assessment & Plan: Visit Diagnoses:    ICD-10-CM   1. Cervical radiculopathy  M54.12 XR C-ARM NO REPORT    Epidural Steroid injection    methylPREDNISolone  acetate (DEPO-MEDROL ) injection 40 mg      Plan: No additional findings.   Meds & Orders:  Meds ordered this encounter  Medications   methylPREDNISolone  acetate (DEPO-MEDROL ) injection 40 mg    Orders Placed This Encounter  Procedures   XR C-ARM NO REPORT   Epidural Steroid injection    Follow-up: Return for visit to requesting provider as needed.   Procedures: No procedures performed  Cervical Epidural Steroid Injection - Interlaminar Approach with Fluoroscopic Guidance  Patient: [name]      Date of Birth: 1933/07/09 MRN: [id number] PCP: Delores Rojelio Caldron, NP      Visit Date: 08/20/2023   Universal Protocol:    Date/Time: 07/14/254:41 PM  Consent Given By: the patient  Position: PRONE  Additional Comments: Vital signs were monitored before and after the procedure. Patient was prepped and draped in the usual sterile fashion. The correct patient, procedure, and site was verified.   Injection Procedure Details:   Procedure diagnoses: Cervical radiculopathy  [M54.12]    Meds Administered:  Meds ordered this encounter  Medications   methylPREDNISolone  acetate (DEPO-MEDROL ) injection 40 mg     Laterality: Right  Location/Site: C7-T1  Needle: 3.5 in., 20 ga. Tuohy  Needle Placement: Paramedian epidural space  Findings:  -Comments: Excellent flow of contrast into the epidural space.  Procedure Details: Using a paramedian approach from the side mentioned above, the region overlying the inferior lamina was localized under fluoroscopic visualization and the soft tissues overlying this structure were infiltrated with 4 ml. of 1% Lidocaine  without Epinephrine. A # 20 gauge, Tuohy needle was inserted into the epidural space using a paramedian approach.  The epidural space was localized using loss of resistance along with contralateral oblique bi-planar fluoroscopic views.  After negative aspirate for air, blood, and CSF, a 2 ml. volume of Isovue-250 was injected into the epidural space and the flow of contrast was observed. Radiographs were obtained for documentation purposes.   The injectate was administered into the level noted above.  Additional Comments:  No complications occurred Dressing: 2 x 2 sterile gauze and Band-Aid    Post-procedure details: Patient was observed during the procedure. Post-procedure instructions were reviewed.  Patient left the clinic in stable condition.   Clinical History: EXAM: MRI CERVICAL SPINE WITHOUT CONTRAST   TECHNIQUE: Multiplanar, multisequence MR imaging of the cervical spine was performed. No intravenous contrast was administered.   COMPARISON:  None Available.   FINDINGS: Alignment: Grade 1 anterolisthesis at C5-6   Vertebrae: No fracture, evidence of discitis, or bone lesion.   Cord: Normal signal and morphology.  Posterior Fossa, vertebral arteries, paraspinal tissues: Negative.   Disc levels:   C1-2: Unremarkable.   C2-3: Small disc bulge with bilateral uncovertebral  hypertrophy. No spinal canal stenosis. Moderate bilateral neural foraminal stenosis.   C3-4: Medium-sized disc bulge with large uncovertebral osteophytes. Severe spinal canal stenosis. Severe bilateral neural foraminal stenosis.   C4-5: Small disc bulge with uncovertebral hypertrophy. Moderate spinal canal stenosis. Moderate right and mild left neural foraminal stenosis.   C5-6: Small disc bulge with moderate bilateral uncovertebral hypertrophy. There is no spinal canal stenosis. Moderate bilateral neural foraminal stenosis.   C6-7: Small disc bulge. There is no spinal canal stenosis. Mild left neural foraminal stenosis.   C7-T1: Medium-sized disc bulge with uncovertebral hypertrophy. Mild spinal canal stenosis. Severe bilateral neural foraminal stenosis.   IMPRESSION: 1. Multilevel cervical degenerative disc disease with severe spinal canal stenosis at C3-4 and moderate spinal canal stenosis at C4-5. 2. Multilevel moderate to severe neural foraminal stenosis, worst at C3-4 and C7-T1.     Electronically Signed   By: Franky Stanford M.D.   On: 06/22/2021 00:11     Objective:  VS:  HT:    WT:   BMI:     BP:(!) 162/72  HR:90bpm  TEMP: ( )  RESP:  Physical Exam Vitals and nursing note reviewed.  Constitutional:      General: She is not in acute distress.    Appearance: Normal appearance. She is not ill-appearing.  HENT:     Head: Normocephalic and atraumatic.     Right Ear: External ear normal.     Left Ear: External ear normal.  Eyes:     Extraocular Movements: Extraocular movements intact.  Cardiovascular:     Rate and Rhythm: Normal rate.     Pulses: Normal pulses.  Musculoskeletal:        General: Tenderness present.     Cervical back: Tenderness present. No rigidity.     Right lower leg: No edema.     Left lower leg: No edema.     Comments: Patient has good strength in the upper extremities including 5 out of 5 strength in elbow felxion and extension but  some weakness with abduction and APB.  There is bilateral atrophy of the hands intrinsically.  There is a equivocal Hoffmann's test. No other myelopathic signs. Sensation in hands is intact.   Lymphadenopathy:     Cervical: No cervical adenopathy.  Skin:    Findings: No erythema, lesion or rash.  Neurological:     General: No focal deficit present.     Mental Status: She is alert and oriented to person, place, and time.     Cranial Nerves: No cranial nerve deficit.     Sensory: No sensory deficit.     Motor: No weakness or abnormal muscle tone.     Coordination: Coordination normal.     Gait: Gait abnormal.  Psychiatric:        Mood and Affect: Mood normal.        Behavior: Behavior normal.      Imaging: Epidural Steroid injection Result Date: 08/20/2023 Eldonna Novel, MD     08/20/2023  4:41 PM Cervical Epidural Steroid Injection - Interlaminar Approach with Fluoroscopic Guidance Patient: [name]     Date of Birth: 23-Nov-1933 MRN: [id number] PCP: Delores Rojelio Caldron, NP     Visit Date: 08/20/2023  Universal Protocol:   Date/Time: 07/14/254:41 PM Consent Given By: the patient Position: PRONE Additional Comments: Vital signs were monitored before and  after the procedure. Patient was prepped and draped in the usual sterile fashion. The correct patient, procedure, and site was verified. Injection Procedure Details: Procedure diagnoses: Cervical radiculopathy [M54.12]  Meds Administered: Meds ordered this encounter Medications  methylPREDNISolone  acetate (DEPO-MEDROL ) injection 40 mg  Laterality: Right Location/Site: C7-T1 Needle: 3.5 in., 20 ga. Tuohy Needle Placement: Paramedian epidural space Findings:  -Comments: Excellent flow of contrast into the epidural space. Procedure Details: Using a paramedian approach from the side mentioned above, the region overlying the inferior lamina was localized under fluoroscopic visualization and the soft tissues overlying this structure were  infiltrated with 4 ml. of 1% Lidocaine  without Epinephrine. A # 20 gauge, Tuohy needle was inserted into the epidural space using a paramedian approach. The epidural space was localized using loss of resistance along with contralateral oblique bi-planar fluoroscopic views.  After negative aspirate for air, blood, and CSF, a 2 ml. volume of Isovue-250 was injected into the epidural space and the flow of contrast was observed. Radiographs were obtained for documentation purposes. The injectate was administered into the level noted above. Additional Comments: No complications occurred Dressing: 2 x 2 sterile gauze and Band-Aid  Post-procedure details: Patient was observed during the procedure. Post-procedure instructions were reviewed. Patient left the clinic in stable condition.

## 2023-08-20 NOTE — Procedures (Signed)
 Cervical Epidural Steroid Injection - Interlaminar Approach with Fluoroscopic Guidance  Patient: [name]      Date of Birth: 1933-09-05 MRN: [id number] PCP: Delores Rojelio Caldron, NP      Visit Date: 08/20/2023   Universal Protocol:    Date/Time: 07/14/254:41 PM  Consent Given By: the patient  Position: PRONE  Additional Comments: Vital signs were monitored before and after the procedure. Patient was prepped and draped in the usual sterile fashion. The correct patient, procedure, and site was verified.   Injection Procedure Details:   Procedure diagnoses: Cervical radiculopathy [M54.12]    Meds Administered:  Meds ordered this encounter  Medications   methylPREDNISolone  acetate (DEPO-MEDROL ) injection 40 mg     Laterality: Right  Location/Site: C7-T1  Needle: 3.5 in., 20 ga. Tuohy  Needle Placement: Paramedian epidural space  Findings:  -Comments: Excellent flow of contrast into the epidural space.  Procedure Details: Using a paramedian approach from the side mentioned above, the region overlying the inferior lamina was localized under fluoroscopic visualization and the soft tissues overlying this structure were infiltrated with 4 ml. of 1% Lidocaine  without Epinephrine. A # 20 gauge, Tuohy needle was inserted into the epidural space using a paramedian approach.  The epidural space was localized using loss of resistance along with contralateral oblique bi-planar fluoroscopic views.  After negative aspirate for air, blood, and CSF, a 2 ml. volume of Isovue-250 was injected into the epidural space and the flow of contrast was observed. Radiographs were obtained for documentation purposes.   The injectate was administered into the level noted above.  Additional Comments:  No complications occurred Dressing: 2 x 2 sterile gauze and Band-Aid    Post-procedure details: Patient was observed during the procedure. Post-procedure instructions were  reviewed.  Patient left the clinic in stable condition.

## 2023-09-14 ENCOUNTER — Other Ambulatory Visit: Payer: Self-pay | Admitting: Cardiovascular Disease

## 2023-09-18 ENCOUNTER — Other Ambulatory Visit: Payer: Self-pay | Admitting: Physical Medicine and Rehabilitation

## 2023-09-18 ENCOUNTER — Telehealth: Payer: Self-pay | Admitting: Physical Medicine and Rehabilitation

## 2023-09-18 DIAGNOSIS — M4802 Spinal stenosis, cervical region: Secondary | ICD-10-CM

## 2023-09-18 DIAGNOSIS — M5412 Radiculopathy, cervical region: Secondary | ICD-10-CM

## 2023-09-18 NOTE — Telephone Encounter (Signed)
Patient called. She would like an appointment with Dr. Newton.  

## 2023-09-18 NOTE — Telephone Encounter (Signed)
 [id number] for Son to call to answer questions

## 2023-09-28 ENCOUNTER — Telehealth: Payer: Self-pay | Admitting: Cardiovascular Disease

## 2023-09-28 NOTE — Telephone Encounter (Signed)
 Pharmacy please advise on holding Xarelto  prior to Epidural injection  scheduled for TBD . Last labs (Renal function panel and CBC) on 07/27/2023 Thank you.

## 2023-09-28 NOTE — Telephone Encounter (Signed)
   Pre-operative Risk Assessment    Patient Name: [name]  DOB: 01/20/1934 MRN: [id number]   Date of last office visit: 08/02/23 Date of next office visit: 01/18/24   Request for Surgical Clearance    Procedure:  epidural injection   Date of Surgery:  Clearance TBD                                Surgeon:  Dr. Baltazar  Surgeon's Group or Practice Name:  [facility]  Phone number:  [phone] Fax number:  [phone]   Type of Clearance Requested:   - Medical  - Pharmacy:  Hold Rivaroxaban  (Xarelto ) 2 days   Type of Anesthesia:     Additional requests/questions:     Bonney Barbee DELENA Claudene   09/28/2023, 10:12 AM

## 2023-09-29 ENCOUNTER — Emergency Department (HOSPITAL_BASED_OUTPATIENT_CLINIC_OR_DEPARTMENT_OTHER): Admission: EM | Admit: 2023-09-29 | Discharge: 2023-09-29 | Disposition: A | Discharge status: Home / Self Care

## 2023-09-29 ENCOUNTER — Encounter (HOSPITAL_BASED_OUTPATIENT_CLINIC_OR_DEPARTMENT_OTHER): Payer: Self-pay

## 2023-09-29 DIAGNOSIS — M542 Cervicalgia: Secondary | ICD-10-CM | POA: Diagnosis present

## 2023-09-29 DIAGNOSIS — Z7901 Long term (current) use of anticoagulants: Secondary | ICD-10-CM | POA: Diagnosis not present

## 2023-09-29 DIAGNOSIS — M5412 Radiculopathy, cervical region: Secondary | ICD-10-CM | POA: Diagnosis not present

## 2023-09-29 MED ORDER — LIDOCAINE 5 % EX PTCH: 1.0000 | MEDICATED_PATCH | CUTANEOUS | 0 refills | Status: AC

## 2023-09-29 NOTE — Discharge Instructions (Addendum)
 Please follow-up with your primary doctor and your surgeon regarding further pain management options.  Return if your symptoms worsen or he develop fevers, chills, lightheadedness, passout, chest pain, shortness of breath or any new or worsening symptoms that are concerning to you.  You may try over-the-counter Voltaren  gel or (TENS) transcutaneous electrical nerve stimulation to see if this helps your symptoms.  However, it does sound as though your orthopedic doctor would like to do try further injections.  Please call them as soon as possible.

## 2023-09-29 NOTE — ED Provider Notes (Signed)
 [facility] Provider Note   CSN: [id number] Arrival date & time: 09/29/23  9048     Patient presents with: Arm Pain   [name] is a 89 y.o. female.   89 year old female presenting emergency department with neck pain.  Chronic and has been present for years, but tingling worsening over the past 3 months.  Had steroid injection several weeks ago with some improvement and is due to have repeat injections.  Reports she continues to have pain and is seeking further pain relief.  She is taking gabapentin  and hydrocodone  currently.   Arm Pain       Prior to Admission medications   Medication Sig Start Date End Date Taking? Authorizing Provider  lidocaine  (LIDODERM ) 5 % Place 1 patch onto the skin daily. Remove & Discard patch within 12 hours or as directed by MD 09/29/23  Yes Neysa Caron JINNY, DO  albuterol  (VENTOLIN  HFA) 108 (90 Base) MCG/ACT inhaler Inhale 2 puffs into the lungs every 4 (four) hours as needed for wheezing or shortness of breath.    [provider]  alendronate (FOSAMAX) 70 MG tablet Take 70 mg by mouth once a week. 10/21/22   [provider]  atorvastatin  (LIPITOR) 40 MG tablet TAKE 1 TABLET BY MOUTH EVERY DAY 08/25/21   Wonda Sharper, MD  Cholecalciferol  (VITAMIN D ) 2000 units tablet Take 2,000 Units by mouth daily.    [provider]  conjugated estrogens  (PREMARIN ) vaginal cream Place 1 Applicatorful vaginally daily. 10/30/22   Emil Share, DO  diclofenac  Sodium (VOLTAREN ) 1 % GEL Apply 4 g topically 4 (four) times daily. Patient not taking: Reported on 08/02/2023 10/30/22   Emil Share, DO  FLOVENT  HFA 110 MCG/ACT inhaler TAKE 2 PUFFS BY MOUTH TWICE A DAY 03/21/17   Bobbitt, Elgin Pepper, MD  fluticasone  (FLONASE ) 50 MCG/ACT nasal spray Place 1 spray into both nostrils as needed. 03/14/21   [provider]  furosemide  (LASIX ) 40 MG tablet Take 1 tablet (40 mg total) by mouth daily.  12/21/22   Wonda Sharper, MD  gabapentin  (NEURONTIN ) 100 MG capsule Take 100-200 mg by mouth at bedtime. 06/22/20   [provider]  HYDROcodone -acetaminophen  (NORCO/VICODIN) 5-325 MG tablet Take 1 tablet by mouth every 8 (eight) hours as needed for moderate pain or severe pain. 07/21/21   Williams, Megan E, NP  hydrocortisone  (ANUSOL -HC) 2.5 % rectal cream Place 1 Application rectally 2 (two) times daily. 10/12/21   Dean Clarity, MD  ketorolac  (ACULAR ) 0.5 % ophthalmic solution Place into both eyes daily. 11/02/20   [provider]  latanoprost (XALATAN) 0.005 % ophthalmic solution SMARTSIG:1 Drop(s) In Eye(s) Every Evening 11/17/22   [provider]  liver oil-zinc  oxide (DESITIN) 40 % ointment Apply topically as needed for irritation. 09/13/20   Vann, Jessica U, DO  losartan (COZAAR) 25 MG tablet Take 25 mg by mouth daily. 10/23/22   [provider]  metoprolol  succinate (TOPROL -XL) 25 MG 24 hr tablet Take 25 mg by mouth daily. 10/23/22   [provider]  metoprolol  tartrate (LOPRESSOR ) 25 MG tablet TAKE 1 TABLET BY MOUTH TWICE A DAY 09/22/21   Wonda Sharper, MD  pantoprazole  (PROTONIX ) 40 MG tablet Take 1 tablet (40 mg total) by mouth daily. 07/23/22 08/02/23  Kriss Estefana DEL, DO  permethrin (ELIMITE) 5 % cream Apply topically. Patient not taking: Reported on 08/02/2023 11/13/22   [provider]  Rivaroxaban  (XARELTO ) 15 MG TABS tablet Take 1 tablet (15  mg total) by mouth daily with supper. 07/24/22   Kriss Estefana DEL, DO  sucralfate  (CARAFATE ) 1 g tablet Take 1 tablet (1 g total) by mouth 4 (four) times daily for 7 days. Patient not taking: Reported on 08/02/2023 07/23/22 08/02/23  Kriss Estefana DEL, DO  traMADol  (ULTRAM ) 50 MG tablet Take 1 tablet (50 mg total) by mouth every 12 (twelve) hours as needed. 11/06/21   Jule Ronal CROME, PA-C    Allergies: Ferumoxytol , Chocolate, Cocoa, Lisinopril , and Peanut oil    Review of Systems  Updated  Vital Signs BP 136/88 (BP Location: Left Arm)   Pulse 61   Temp 98.4 F (36.9 C) (Oral)   Resp 10   Ht 5' 4 (1.626 m)   Wt 60.8 kg   SpO2 98%   BMI 23.00 kg/m   Physical Exam Vitals and nursing note reviewed.  HENT:     Head: Normocephalic.     Nose: Nose normal.     Mouth/Throat:     Mouth: Mucous membranes are moist.  Eyes:     Conjunctiva/sclera: Conjunctivae normal.  Cardiovascular:     Rate and Rhythm: Normal rate.  Abdominal:     General: Abdomen is flat.     Palpations: Abdomen is soft.  Musculoskeletal:        General: Normal range of motion.     Comments: No midline spinal tenderness.  Does have some tenderness to the paracervical musculature.  5 and 5 grip strength, bicep strength tricep strength.  Subjective paresthesias to the hand bilaterally  Skin:    General: Skin is warm and dry.     Capillary Refill: Capillary refill takes less than 2 seconds.  Neurological:     General: No focal deficit present.     Mental Status: She is alert.  Psychiatric:        Mood and Affect: Mood normal.        Behavior: Behavior normal.     (all labs ordered are listed, but only abnormal results are displayed) Labs Reviewed - No data to display  EKG: EKG Interpretation Date/Time:  Saturday September 29 2023 10:07:31 EDT Ventricular Rate:  124 PR Interval:    QRS Duration:  99 QT Interval:  340 QTC Calculation: 437 R Axis:   215  Text Interpretation: Atrial fibrillation Ventricular bigeminy Markedly posterior QRS axis Anteroseptal infarct, old Borderline T abnormalities, inferior leads Confirmed by Neysa Clap [phone]) on 09/29/2023 10:26:53 AM  Radiology: No results found.   Procedures   Medications Ordered in the ED - No data to display  Clinical Course as of 09/29/23 1326  Sat Sep 29, 2023  1106 Per ortho note on 5/21: Cervical MRI imaging from 2023 shows severe spinal canal stenosis at C3-4 and moderate spinal canal stenosis at C4-5. There is multilevel  moderate to severe neural foraminal stenosis, worst at C3-4 and C7-T1. History of left T1-T2 interlaminar epidural steroid injection in our office on 11/24/2021. [TY]    Clinical Course User Index [TY] Neysa Clap PARAS, DO                                 Medical Decision Making This is an 89 year old female presenting emergency department for chronic neck pain.  Afebrile vital signs reassuring.  Physical exam reassuring.  Per chart review has severe spinal stenosis on prior MRI.  Recently seen by Ortho doing steroid injections.  She is already taking Percocets  and hydrocodone .  Given her advanced age I do not feel comfortable adding further narcotic pain medications.  Discussed topical Voltaren  gel versus TENS unit and follow-up with PCP and surgeon for further pain management solutions.  Offered lidocaine  patch here, but patient declined.  Will discharge in stable condition.  Risk Prescription drug management.      Final diagnoses:  Cervical radiculopathy    ED Discharge Orders          Ordered    lidocaine  (LIDODERM ) 5 %  Every 24 hours        09/29/23 1132               Neysa Caron PARAS, DO 09/29/23 1326

## 2023-09-29 NOTE — ED Triage Notes (Signed)
 C/o bilateral neck pain and upper back pain radiating down bilateral arms and into hands x 3 months. States hands feel weak and numb. Denies known injury

## 2023-09-29 NOTE — ED Notes (Signed)

## 2023-10-01 ENCOUNTER — Telehealth: Payer: Self-pay

## 2023-10-01 NOTE — Telephone Encounter (Signed)
 Son called to check status of injection. [phone]

## 2023-10-03 ENCOUNTER — Encounter: Admitting: Physical Medicine and Rehabilitation

## 2023-10-03 NOTE — Telephone Encounter (Signed)
   Patient Name: [name]  DOB: 1933/03/06 MRN: [id number]  Primary Cardiologist: Ozell Fell, MD  Chart reviewed as part of pre-operative protocol coverage. Given past medical history and time since last visit, based on ACC/AHA guidelines, [name] is at acceptable risk for the planned procedure without further cardiovascular testing.  She was able to complete 4 METS of activity without any difficulty.  Per office protocol, patient can hold Xarelto  for 3 days prior to procedure.    The patient was advised that if she develops new symptoms prior to surgery to contact our office to arrange for a follow-up visit, and she verbalized understanding.  I will route this recommendation to the requesting party via Epic fax function and remove from pre-op pool.  Please call with questions.  Wyn Raddle, Jackee Shove, NP 10/03/2023, 3:12 PM

## 2023-10-03 NOTE — Telephone Encounter (Signed)
 Patient with diagnosis of afib on Xarelto  for anticoagulation.    Procedure: TBD Date of procedure: epidural injection    CHA2DS2-VASc Score = 6   This indicates a 9.7% annual risk of stroke. The patient's score is based upon: CHF History: 1 HTN History: 1 Diabetes History: 0 Stroke History: 0 Vascular Disease History: 1 Age Score: 2 Gender Score: 1      CrCl 33 ml/min Platelet count 285  Patient has not had an Afib/aflutter ablation within the last 3 months or DCCV within the last 30 days  Per office protocol, patient can hold Xarelto  for 3 days prior to procedure.    **This guidance is not considered finalized until pre-operative APP has relayed final recommendations.**

## 2023-10-04 ENCOUNTER — Ambulatory Visit (INDEPENDENT_AMBULATORY_CARE_PROVIDER_SITE_OTHER): Admitting: Physical Medicine and Rehabilitation

## 2023-10-04 ENCOUNTER — Other Ambulatory Visit: Payer: Self-pay

## 2023-10-04 VITALS — BP 124/80 | HR 72

## 2023-10-04 DIAGNOSIS — M5412 Radiculopathy, cervical region: Secondary | ICD-10-CM | POA: Diagnosis not present

## 2023-10-04 MED ORDER — METHYLPREDNISOLONE ACETATE 40 MG/ML IJ SUSP
40.0000 mg | Freq: Once | INTRAMUSCULAR | Status: AC
  Administered 2023-10-04: 40 mg

## 2023-10-04 NOTE — Procedures (Unsigned)
 Cervical Epidural Steroid Injection - Interlaminar Approach with Fluoroscopic Guidance  Patient: [name]      Date of Birth: 10-27-1933 MRN: [id number] PCP: Delores Rojelio Caldron, NP      Visit Date: 10/04/2023   Universal Protocol:    Date/Time: 08/28/253:07 PM  Consent Given By: the patient  Position: PRONE  Additional Comments: Vital signs were monitored before and after the procedure. Patient was prepped and draped in the usual sterile fashion. The correct patient, procedure, and site was verified.   Injection Procedure Details:   Procedure diagnoses: Cervical radiculopathy [M54.12]    Meds Administered:  Meds ordered this encounter  Medications   methylPREDNISolone  acetate (DEPO-MEDROL ) injection 40 mg     Laterality: Left  Location/Site: T1-2  Needle: 3.5 in., 20 ga. Tuohy  Needle Placement: Paramedian epidural space  Findings:  -Comments: Excellent flow of contrast into the epidural space.  Procedure Details: Using a paramedian approach from the side mentioned above, the region overlying the inferior lamina was localized under fluoroscopic visualization and the soft tissues overlying this structure were infiltrated with 4 ml. of 1% Lidocaine  without Epinephrine. A # 20 gauge, Tuohy needle was inserted into the epidural space using a paramedian approach.  The epidural space was localized using loss of resistance along with contralateral oblique bi-planar fluoroscopic views.  After negative aspirate for air, blood, and CSF, a 2 ml. volume of Isovue-250 was injected into the epidural space and the flow of contrast was observed. Radiographs were obtained for documentation purposes.   The injectate was administered into the level noted above.  Additional Comments:  The patient tolerated the procedure well Dressing: 2 x 2 sterile gauze and Band-Aid    Post-procedure details: Patient was observed during the procedure. Post-procedure instructions were  reviewed.  Patient left the clinic in stable condition.

## 2023-10-04 NOTE — Progress Notes (Unsigned)
 Pain Scale   Average Pain 10 Patient advising she has chronic neck pain radiating to both hands . Patient advising she has no relief        +Driver, -BT, -Dye Allergies.

## 2023-10-05 NOTE — Progress Notes (Signed)
 [name] - 89 y.o. female MRN [id number]  Date of birth: 1933/07/26  Office Visit Note: Visit Date: 10/04/2023 PCP: Delores Rojelio Caldron, NP Referred by: Delores Rojelio Caldron, NP  Subjective: Chief Complaint  Patient presents with   Neck - Pain   HPI:  [name] is a 89 y.o. female who comes in today for planned repeat Left T1-2  Cervical Interlaminar epidural steroid injection with fluoroscopic guidance.  The patient has failed conservative care including home exercise, medications, time and activity modification.  This injection will be diagnostic and hopefully therapeutic.  Please see requesting physician notes for further details and justification. Patient received more than 50% pain relief from prior injection.   Referring: Duwaine Pouch, FNP   ROS Otherwise per HPI.  Assessment & Plan: Visit Diagnoses:    ICD-10-CM   1. Cervical radiculopathy  M54.12 XR C-ARM NO REPORT    Epidural Steroid injection    methylPREDNISolone  acetate (DEPO-MEDROL ) injection 40 mg      Plan: No additional findings.   Meds & Orders:  Meds ordered this encounter  Medications   methylPREDNISolone  acetate (DEPO-MEDROL ) injection 40 mg    Orders Placed This Encounter  Procedures   XR C-ARM NO REPORT   Epidural Steroid injection    Follow-up: Return for visit to requesting provider as needed.   Procedures: No procedures performed  Cervical Epidural Steroid Injection - Interlaminar Approach with Fluoroscopic Guidance  Patient: [name]      Date of Birth: 03/02/1933 MRN: [id number] PCP: Delores Rojelio Caldron, NP      Visit Date: 10/04/2023   Universal Protocol:    Date/Time: 08/28/253:07 PM  Consent Given By: the patient  Position: PRONE  Additional Comments: Vital signs were monitored before and after the procedure. Patient was prepped and draped in the usual sterile fashion. The correct patient, procedure, and site was verified.   Injection Procedure  Details:   Procedure diagnoses: Cervical radiculopathy [M54.12]    Meds Administered:  Meds ordered this encounter  Medications   methylPREDNISolone  acetate (DEPO-MEDROL ) injection 40 mg     Laterality: Left  Location/Site: T1-2  Needle: 3.5 in., 20 ga. Tuohy  Needle Placement: Paramedian epidural space  Findings:  -Comments: Excellent flow of contrast into the epidural space.  Procedure Details: Using a paramedian approach from the side mentioned above, the region overlying the inferior lamina was localized under fluoroscopic visualization and the soft tissues overlying this structure were infiltrated with 4 ml. of 1% Lidocaine  without Epinephrine. A # 20 gauge, Tuohy needle was inserted into the epidural space using a paramedian approach.  The epidural space was localized using loss of resistance along with contralateral oblique bi-planar fluoroscopic views.  After negative aspirate for air, blood, and CSF, a 2 ml. volume of Isovue-250 was injected into the epidural space and the flow of contrast was observed. Radiographs were obtained for documentation purposes.   The injectate was administered into the level noted above.  Additional Comments:  The patient tolerated the procedure well Dressing: 2 x 2 sterile gauze and Band-Aid    Post-procedure details: Patient was observed during the procedure. Post-procedure instructions were reviewed.  Patient left the clinic in stable condition.    Clinical History: EXAM: MRI CERVICAL SPINE WITHOUT CONTRAST   TECHNIQUE: Multiplanar, multisequence MR imaging of the cervical spine was performed. No intravenous contrast was administered.   COMPARISON:  None Available.   FINDINGS: Alignment: Grade 1 anterolisthesis at C5-6   Vertebrae: No fracture,  evidence of discitis, or bone lesion.   Cord: Normal signal and morphology.   Posterior Fossa, vertebral arteries, paraspinal tissues: Negative.   Disc levels:   C1-2:  Unremarkable.   C2-3: Small disc bulge with bilateral uncovertebral hypertrophy. No spinal canal stenosis. Moderate bilateral neural foraminal stenosis.   C3-4: Medium-sized disc bulge with large uncovertebral osteophytes. Severe spinal canal stenosis. Severe bilateral neural foraminal stenosis.   C4-5: Small disc bulge with uncovertebral hypertrophy. Moderate spinal canal stenosis. Moderate right and mild left neural foraminal stenosis.   C5-6: Small disc bulge with moderate bilateral uncovertebral hypertrophy. There is no spinal canal stenosis. Moderate bilateral neural foraminal stenosis.   C6-7: Small disc bulge. There is no spinal canal stenosis. Mild left neural foraminal stenosis.   C7-T1: Medium-sized disc bulge with uncovertebral hypertrophy. Mild spinal canal stenosis. Severe bilateral neural foraminal stenosis.   IMPRESSION: 1. Multilevel cervical degenerative disc disease with severe spinal canal stenosis at C3-4 and moderate spinal canal stenosis at C4-5. 2. Multilevel moderate to severe neural foraminal stenosis, worst at C3-4 and C7-T1.     Electronically Signed   By: Franky Stanford M.D.   On: 06/22/2021 00:11     Objective:  VS:  HT:    WT:   BMI:     BP:124/80  HR:72bpm  TEMP: ( )  RESP:  Physical Exam Vitals and nursing note reviewed.  Constitutional:      General: She is not in acute distress.    Appearance: Normal appearance. She is not ill-appearing.  HENT:     Head: Normocephalic and atraumatic.     Right Ear: External ear normal.     Left Ear: External ear normal.  Eyes:     Extraocular Movements: Extraocular movements intact.  Cardiovascular:     Rate and Rhythm: Normal rate.     Pulses: Normal pulses.  Musculoskeletal:     Cervical back: Tenderness present. No rigidity.     Right lower leg: No edema.     Left lower leg: No edema.     Comments: Patient has good strength in the upper extremities including 5 out of 5 strength in wrist  extension long finger flexion and APB.  There is no atrophy of the hands intrinsically.  There is a negative Hoffmann's test.   Lymphadenopathy:     Cervical: No cervical adenopathy.  Skin:    Findings: No erythema, lesion or rash.  Neurological:     General: No focal deficit present.     Mental Status: She is alert and oriented to person, place, and time.     Sensory: No sensory deficit.     Motor: No weakness or abnormal muscle tone.     Coordination: Coordination normal.  Psychiatric:        Mood and Affect: Mood normal.        Behavior: Behavior normal.      Imaging: XR C-ARM NO REPORT Result Date: 10/04/2023 Please see Notes tab for imaging impression.

## 2023-10-15 ENCOUNTER — Telehealth: Payer: Self-pay | Admitting: Physical Medicine and Rehabilitation

## 2023-10-15 DIAGNOSIS — R202 Paresthesia of skin: Secondary | ICD-10-CM

## 2023-10-15 NOTE — Telephone Encounter (Signed)
 Pt's son called requesting an appt with [facility]. Last appt 10/04/23. Phone number is [phone]

## 2023-10-19 ENCOUNTER — Telehealth: Payer: Self-pay | Admitting: Physical Medicine and Rehabilitation

## 2023-10-19 ENCOUNTER — Telehealth: Payer: Self-pay

## 2023-10-19 ENCOUNTER — Other Ambulatory Visit: Payer: Self-pay | Admitting: Physical Medicine and Rehabilitation

## 2023-10-19 DIAGNOSIS — G894 Chronic pain syndrome: Secondary | ICD-10-CM

## 2023-10-19 DIAGNOSIS — M542 Cervicalgia: Secondary | ICD-10-CM

## 2023-10-19 DIAGNOSIS — M5412 Radiculopathy, cervical region: Secondary | ICD-10-CM

## 2023-10-19 DIAGNOSIS — M4802 Spinal stenosis, cervical region: Secondary | ICD-10-CM

## 2023-10-19 MED ORDER — HYDROCODONE-ACETAMINOPHEN 5-325 MG PO TABS: 1.0000 | ORAL_TABLET | Freq: Three times a day (TID) | ORAL | 0 refills | Status: AC | PRN

## 2023-10-19 NOTE — Telephone Encounter (Signed)
 Patients son notified of medication called in and orders per Dr. Eldonna.

## 2023-10-19 NOTE — Telephone Encounter (Signed)
 Patient son called and had some questions about the medication she sent in for his mother . If you could give him a call. [phone]  The pharmacy needs more information.

## 2023-10-19 NOTE — Telephone Encounter (Signed)
 Patient advised she is in a lot of pain in her Neck, shoulder and she is unable to get out of bed, Patient son asking what to do?--[phone]

## 2023-10-26 ENCOUNTER — Emergency Department (HOSPITAL_COMMUNITY)

## 2023-10-26 ENCOUNTER — Other Ambulatory Visit: Payer: Self-pay

## 2023-10-26 ENCOUNTER — Emergency Department (HOSPITAL_COMMUNITY)
Admission: EM | Admit: 2023-10-26 | Discharge: 2023-10-27 | Disposition: A | Discharge status: Home / Self Care | Attending: Emergency Medicine | Admitting: Emergency Medicine

## 2023-10-26 ENCOUNTER — Encounter (HOSPITAL_COMMUNITY): Payer: Self-pay

## 2023-10-26 DIAGNOSIS — N189 Chronic kidney disease, unspecified: Secondary | ICD-10-CM | POA: Insufficient documentation

## 2023-10-26 DIAGNOSIS — I130 Hypertensive heart and chronic kidney disease with heart failure and stage 1 through stage 4 chronic kidney disease, or unspecified chronic kidney disease: Secondary | ICD-10-CM | POA: Insufficient documentation

## 2023-10-26 DIAGNOSIS — R200 Anesthesia of skin: Secondary | ICD-10-CM | POA: Diagnosis not present

## 2023-10-26 DIAGNOSIS — I482 Chronic atrial fibrillation, unspecified: Secondary | ICD-10-CM | POA: Insufficient documentation

## 2023-10-26 DIAGNOSIS — Z91010 Allergy to peanuts: Secondary | ICD-10-CM | POA: Insufficient documentation

## 2023-10-26 DIAGNOSIS — I509 Heart failure, unspecified: Secondary | ICD-10-CM | POA: Diagnosis not present

## 2023-10-26 DIAGNOSIS — G9520 Unspecified cord compression: Secondary | ICD-10-CM

## 2023-10-26 DIAGNOSIS — Z79899 Other long term (current) drug therapy: Secondary | ICD-10-CM | POA: Diagnosis not present

## 2023-10-26 DIAGNOSIS — M542 Cervicalgia: Secondary | ICD-10-CM | POA: Insufficient documentation

## 2023-10-26 DIAGNOSIS — Z7901 Long term (current) use of anticoagulants: Secondary | ICD-10-CM | POA: Diagnosis not present

## 2023-10-26 LAB — CBC
HCT: 33.9 % — ABNORMAL LOW (ref 36.0–46.0)
Hemoglobin: 10.8 g/dL — ABNORMAL LOW (ref 12.0–15.0)
MCH: 28.7 pg (ref 26.0–34.0)
MCHC: 31.9 g/dL (ref 30.0–36.0)
MCV: 90.2 fL (ref 80.0–100.0)
Platelets: 329 K/uL (ref 150–400)
RBC: 3.76 MIL/uL — ABNORMAL LOW (ref 3.87–5.11)
RDW: 14.7 % (ref 11.5–15.5)
WBC: 7.2 K/uL (ref 4.0–10.5)
nRBC: 0 % (ref 0.0–0.2)

## 2023-10-26 LAB — BASIC METABOLIC PANEL WITH GFR
Anion gap: 16 — ABNORMAL HIGH (ref 5–15)
BUN: 15 mg/dL (ref 8–23)
CO2: 20 mmol/L — ABNORMAL LOW (ref 22–32)
Calcium: 9.4 mg/dL (ref 8.9–10.3)
Chloride: 100 mmol/L (ref 98–111)
Creatinine, Ser: 0.73 mg/dL (ref 0.44–1.00)
GFR, Estimated: >60 mL/min (ref >60)
Glucose, Bld: 107 mg/dL — ABNORMAL HIGH (ref 70–99)
Potassium: 3.5 mmol/L (ref 3.5–5.1)
Sodium: 136 mmol/L (ref 135–145)

## 2023-10-26 LAB — PRO BRAIN NATRIURETIC PEPTIDE: Pro Brain Natriuretic Peptide: 1335 pg/mL — ABNORMAL HIGH (ref <300.0)

## 2023-10-26 NOTE — ED Provider Notes (Signed)
 [place] EMERGENCY DEPARTMENT AT [facility] Provider Note   CSN: [id number] Arrival date & time: 10/26/23  1740     Patient presents with: Numbness   [name] is a 89 y.o. female.   The history is provided by the patient.  [name] is a 89 y.o. female who presents to the Emergency Department complaining of neck pain. She presents the emergency department for progressive neck pain that is been ongoing for several years but significantly worsened over the last month. She is now having pain and numbness in her hands and she is unable to use her hands because they are weak and uncomfortable. She also reports numbness in her legs. She has been getting shots in her neck and does have stopped working. The last injection was August 28. She did hold her anticoagulation for three days. No incontinence. No fevers, chest pain, difficulty breathing, leg swelling, nausea, vomiting.   Hx/o HTN, CHF, afib - Xarelto , ckd.    Prior to Admission medications   Medication Sig Start Date End Date Taking? Authorizing Provider  albuterol  (VENTOLIN  HFA) 108 (90 Base) MCG/ACT inhaler Inhale 2 puffs into the lungs every 4 (four) hours as needed for wheezing or shortness of breath.    [provider]  alendronate (FOSAMAX) 70 MG tablet Take 70 mg by mouth once a week. 10/21/22   [provider]  atorvastatin  (LIPITOR) 40 MG tablet TAKE 1 TABLET BY MOUTH EVERY DAY 08/25/21   Wonda Sharper, MD  Cholecalciferol  (VITAMIN D ) 2000 units tablet Take 2,000 Units by mouth daily.    [provider]  conjugated estrogens  (PREMARIN ) vaginal cream Place 1 Applicatorful vaginally daily. 10/30/22   Emil Share, DO  diclofenac  Sodium (VOLTAREN ) 1 % GEL Apply 4 g topically 4 (four) times daily. Patient not taking: Reported on 08/02/2023 10/30/22   Emil Share, DO  FLOVENT  HFA 110 MCG/ACT inhaler TAKE 2 PUFFS BY MOUTH TWICE A DAY 03/21/17   Bobbitt, Elgin Pepper, MD   fluticasone  (FLONASE ) 50 MCG/ACT nasal spray Place 1 spray into both nostrils as needed. 03/14/21   [provider]  furosemide  (LASIX ) 40 MG tablet Take 1 tablet (40 mg total) by mouth daily. 12/21/22   Wonda Sharper, MD  gabapentin  (NEURONTIN ) 100 MG capsule Take 100-200 mg by mouth at bedtime. 06/22/20   [provider]  hydrocortisone  (ANUSOL -HC) 2.5 % rectal cream Place 1 Application rectally 2 (two) times daily. 10/12/21   Dean Clarity, MD  ketorolac  (ACULAR ) 0.5 % ophthalmic solution Place into both eyes daily. 11/02/20   [provider]  latanoprost (XALATAN) 0.005 % ophthalmic solution SMARTSIG:1 Drop(s) In Eye(s) Every Evening 11/17/22   [provider]  lidocaine  (LIDODERM ) 5 % Place 1 patch onto the skin daily. Remove & Discard patch within 12 hours or as directed by MD 09/29/23   Neysa Caron JINNY, DO  liver oil-zinc  oxide (DESITIN) 40 % ointment Apply topically as needed for irritation. 09/13/20   Vann, Jessica U, DO  losartan (COZAAR) 25 MG tablet Take 25 mg by mouth daily. 10/23/22   [provider]  metoprolol  succinate (TOPROL -XL) 25 MG 24 hr tablet Take 25 mg by mouth daily. 10/23/22   [provider]  metoprolol  tartrate (LOPRESSOR ) 25 MG tablet TAKE 1 TABLET BY MOUTH TWICE A DAY 09/22/21   Wonda Sharper, MD  pantoprazole  (PROTONIX ) 40 MG tablet Take 1 tablet (40 mg total) by mouth daily. 07/23/22 08/02/23  Kriss Estefana DEL, DO  permethrin FERDIE)  5 % cream Apply topically. Patient not taking: Reported on 08/02/2023 11/13/22   [provider]  Rivaroxaban  (XARELTO ) 15 MG TABS tablet Take 1 tablet (15 mg total) by mouth daily with supper. 07/24/22   Kriss Estefana DEL, DO  sucralfate  (CARAFATE ) 1 g tablet Take 1 tablet (1 g total) by mouth 4 (four) times daily for 7 days. Patient not taking: Reported on 08/02/2023 07/23/22 08/02/23  Kriss Estefana DEL, DO    Allergies: Ferumoxytol , Chocolate, Cocoa, Lisinopril , and Peanut oil     Review of Systems  All other systems reviewed and are negative.   Updated Vital Signs BP (!) 116/90   Pulse 81   Temp 97.6 F (36.4 C) (Oral)   Resp 20   SpO2 100%   Physical Exam Vitals and nursing note reviewed.  Constitutional:      Appearance: She is well-developed.  HENT:     Head: Normocephalic and atraumatic.  Cardiovascular:     Rate and Rhythm: Normal rate. Rhythm irregular.     Heart sounds: No murmur heard. Pulmonary:     Effort: Pulmonary effort is normal. No respiratory distress.     Breath sounds: Normal breath sounds.  Abdominal:     Palpations: Abdomen is soft.     Tenderness: There is no abdominal tenderness. There is no guarding or rebound.  Musculoskeletal:        General: No tenderness.  Skin:    General: Skin is warm and dry.  Neurological:     Mental Status: She is alert and oriented to person, place, and time.     Comments: 4/5 strength in BUE in proximal and distal muscle groups.  5/5 strength in BLE with sensation to light touch intact in all four extremities.   Psychiatric:        Behavior: Behavior normal.     (all labs ordered are listed, but only abnormal results are displayed) Labs Reviewed  CBC - Abnormal; Notable for the following components:      Result Value   RBC 3.76 (*)    Hemoglobin 10.8 (*)    HCT 33.9 (*)    All other components within normal limits  BASIC METABOLIC PANEL WITH GFR - Abnormal; Notable for the following components:   CO2 20 (*)    Glucose, Bld 107 (*)    Anion gap 16 (*)    All other components within normal limits  PRO BRAIN NATRIURETIC PEPTIDE - Abnormal; Notable for the following components:   Pro Brain Natriuretic Peptide 1,335.0 (*)    All other components within normal limits    EKG: EKG Interpretation Date/Time:  Friday October 26 2023 17:52:50 EDT Ventricular Rate:  91 PR Interval:  147 QRS Duration:  103 QT Interval:  351 QTC Calculation: 432 R Axis:   147  Text  Interpretation: Sinus rhythm Ventricular premature complex Sinus pause Right axis deviation Low voltage, precordial leads Probable anteroseptal infarct, old Nonspecific T abnormalities, inferior leads Confirmed by Griselda Norris [phone]) on 10/26/2023 10:58:44 PM  Radiology: No results found.   Procedures   Medications Ordered in the ED - No data to display                                  Medical Decision Making Amount and/or Complexity of Data Reviewed Labs: ordered. Radiology: ordered.  Risk Prescription drug management.   Patient with history of CHF, a fib on anticoagulation here  for evaluation of progressive progressive pain and weakness to her arms/hands. She does have weakness on examination. Given her recent injection, anticoagulation plan to obtain MRI to evaluate for hematoma or progressive compression. BNP was ordered in triage, patient does have long-standing CHF. She does not have any symptoms of acute decompensation. Feels she can follow up with cardiology regarding her BNP. Patient care transferred pending advanced imaging.     Final diagnoses:  None    ED Discharge Orders     None          Griselda Norris, MD 10/27/23 [phone]

## 2023-10-26 NOTE — ED Triage Notes (Addendum)
 Pt came in for numbness and a tingling sensation in both arms, hands, back and neck that's been progressively getting worse over time (few months).  Hx congestive heart failure

## 2023-10-27 ENCOUNTER — Emergency Department (HOSPITAL_COMMUNITY)

## 2023-10-27 ENCOUNTER — Encounter (HOSPITAL_COMMUNITY): Payer: Self-pay

## 2023-10-27 DIAGNOSIS — M542 Cervicalgia: Secondary | ICD-10-CM | POA: Diagnosis not present

## 2023-10-27 MED ORDER — METOPROLOL SUCCINATE ER 25 MG PO TB24
25.0000 mg | ORAL_TABLET | Freq: Every day | ORAL | Status: DC
  Administered 2023-10-27: 25 mg via ORAL
  Filled 2023-10-27: qty 1

## 2023-10-27 MED ORDER — GADOBUTROL 1 MMOL/ML IV SOLN
6.0000 mL | Freq: Once | INTRAVENOUS | Status: AC | PRN
Start: 2023-10-27 — End: 2023-10-27
  Administered 2023-10-27: 6 mL via INTRAVENOUS

## 2023-10-27 MED ORDER — PREDNISONE 10 MG (21) PO TBPK: ORAL_TABLET | Freq: Every day | ORAL | 0 refills | Status: DC

## 2023-10-27 MED ORDER — OXYCODONE HCL 5 MG PO TABS
2.5000 mg | ORAL_TABLET | Freq: Once | ORAL | Status: AC
  Administered 2023-10-27: 2.5 mg via ORAL
  Filled 2023-10-27: qty 1

## 2023-10-27 MED ORDER — PREDNISONE 20 MG PO TABS
20.0000 mg | ORAL_TABLET | Freq: Once | ORAL | Status: AC
Start: 2023-10-27 — End: 2023-10-27
  Administered 2023-10-27: 20 mg via ORAL
  Filled 2023-10-27: qty 1

## 2023-10-27 MED ORDER — FUROSEMIDE 40 MG PO TABS
40.0000 mg | ORAL_TABLET | Freq: Every day | ORAL | Status: DC
  Administered 2023-10-27: 40 mg via ORAL
  Filled 2023-10-27: qty 1

## 2023-10-27 NOTE — ED Provider Notes (Signed)
  Physical Exam  BP 113/69   Pulse 84   Temp 98.1 F (36.7 C)   Resp 18   SpO2 96%   Physical Exam  Procedures  Procedures  ED Course / MDM    Medical Decision Making Amount and/or Complexity of Data Reviewed Labs: ordered. Radiology: ordered.  Risk Prescription drug management.   89 yo ho of cervical myopathy, received recent injection 2 weeks ago and now with hand weakness on exam.  Patient followed by PMR and ortho.  HO a fib and anticoagulated.   Elevated BNP-not dyspneic Pending MRI of neck Reviewed MRI with significant stenosis noted.  Discussed results with patient and son.  She reports that her hand weakness has been present for 2 months.  She has not really noted a significant change and states she is here because of pain control.  Plan prednisone .  Attempting to contact Dr. Eldonna, her PMR physician with Ortho care.  Discussed with [facility], [place] [facility].  They will contact patient to arrange for follow-up with their spine surgeon Patient has stable symptoms and do not feel acute intervention is required at this time.  Plan prednisone  taper. Discussed with patient that if she is having worsening symptoms she should call her doctor and be reevaluated immediately.     Levander Houston, MD 10/27/23 1311

## 2023-10-27 NOTE — Discharge Instructions (Signed)
 You are being given a prednisone  prescription for your neck pain.  You were given your first dose here in the emergency department.  You can take the medication as soon as you get it filled. Your orthopedic team is to contact you and arrange for follow-up with the neck surgeon in their practice next week. Return if you are having any worsening weakness or other new symptoms

## 2023-10-31 ENCOUNTER — Encounter: Admitting: Physical Medicine and Rehabilitation

## 2023-11-01 ENCOUNTER — Other Ambulatory Visit

## 2023-11-09 ENCOUNTER — Encounter: Admitting: Physical Medicine and Rehabilitation

## 2023-11-23 ENCOUNTER — Ambulatory Visit: Admitting: Physical Medicine and Rehabilitation

## 2023-11-23 DIAGNOSIS — M79641 Pain in right hand: Secondary | ICD-10-CM

## 2023-11-23 DIAGNOSIS — M79642 Pain in left hand: Secondary | ICD-10-CM | POA: Diagnosis not present

## 2023-11-23 DIAGNOSIS — M4802 Spinal stenosis, cervical region: Secondary | ICD-10-CM

## 2023-11-23 DIAGNOSIS — R29898 Other symptoms and signs involving the musculoskeletal system: Secondary | ICD-10-CM | POA: Diagnosis not present

## 2023-11-23 DIAGNOSIS — R202 Paresthesia of skin: Secondary | ICD-10-CM | POA: Diagnosis not present

## 2023-11-23 NOTE — Progress Notes (Unsigned)
 Pain Scale   Average Pain 10   BUE NCS, aches, pain, with numbness in both hands. Pain in the morning and keeps her up at night. Difficultly doing ADL.  Right handed.

## 2023-11-27 ENCOUNTER — Encounter: Admitting: Physical Medicine and Rehabilitation

## 2023-11-27 NOTE — Procedures (Unsigned)
 EMG & NCV Findings: Evaluation of the left median motor and the right median motor nerves showed prolonged distal onset latency (L4.8, R4.8 ms), reduced amplitude (L2.5, R2.2 mV), and decreased conduction velocity (Elbow-Wrist, L41, R42 m/s).  The left ulnar motor nerve showed decreased conduction velocity (A Elbow-B Elbow, 50 m/s).  The left median (across palm) sensory nerve showed no response (Palm), prolonged distal peak latency (4.7 ms), and reduced amplitude (8.6 V).  The right median (across palm) sensory nerve showed no response (Wrist) and no response (Palm).  The left ulnar sensory nerve showed prolonged distal peak latency (3.8 ms) and decreased conduction velocity (Wrist-5th Digit, 37 m/s).  The right ulnar sensory nerve showed prolonged distal peak latency (6.0 ms), reduced amplitude (8.6 V), and decreased conduction velocity (Wrist-5th Digit, 23 m/s).  All remaining nerves (as indicated in the following tables) were within normal limits.  Left vs. Right side comparison data for the ulnar motor nerve indicates abnormal L-R amplitude difference (26.8 %).  The ulnar sensory nerve indicates abnormal L-R latency difference (2.2 ms).  All remaining left vs. right side differences were within normal limits.    Needle evaluation of the right abductor pollicis brevis muscle showed increased insertional activity and moderately increased spontaneous activity.  All remaining muscles (as indicated in the following table) showed no evidence of electrical instability.    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    There is no significant electrodiagnostic evidence of any other focal nerve entrapment, brachial plexopathy or cervical radiculopathy.   Recommendations: 1.  Follow-up with referring physician. 2.  Continue current management of  symptoms. 3.  Continue use of resting splint at night-time and as needed during the day. 4.  Suggest surgical evaluation.  ___________________________ Prentice Masters FAAPMR Board Certified, American Board of Physical Medicine and Rehabilitation    Nerve Conduction Studies Anti Sensory Summary Table   Stim Site NR Peak (ms) Norm Peak (ms) P-T Amp (V) Norm P-T Amp Site1 Site2 Delta-P (ms) Dist (cm) Vel (m/s) Norm Vel (m/s)  Left Median Acr Palm Anti Sensory (2nd Digit)  31.9C  Wrist    *4.7 <3.6 *8.6 >10 Wrist Palm  0.0    Palm *NR  <2.0          Right Median Acr Palm Anti Sensory (2nd Digit)  31.6C  Wrist *NR  <3.6  >10 Wrist Palm  0.0    Palm *NR  <2.0          Left Radial Anti Sensory (Base 1st Digit)  32C  Wrist    2.5 <3.1 16.4  Wrist Base 1st Digit 2.5 0.0    Right Radial Anti Sensory (Base 1st Digit)  31C  Wrist    2.5 <3.1 6.4  Wrist Base 1st Digit 2.5 0.0    Left Ulnar Anti Sensory (5th Digit)  32.4C  Wrist    *3.8 <3.7 16.6 >15.0 Wrist 5th Digit 3.8 14.0 *37 >38  Right Ulnar Anti Sensory (5th Digit)  31.6C  Wrist    *6.0 <3.7 *8.6 >15.0 Wrist 5th Digit 6.0 14.0 *23 >38  B Elbow    14.2  4.4  B Elbow Wrist 8.2 0.0  >47   Motor Summary Table   Stim Site NR Onset (ms) Norm Onset (ms) O-P Amp (mV) Norm O-P Amp Site1 Site2 Delta-0 (ms) Dist (cm) Vel (m/s) Norm Vel (m/s)  Left Median Motor (Abd Poll Brev)  32.2C  Wrist    *4.8 <4.2 *2.5 >5 Elbow Wrist 5.3 21.5 *41 >50  Elbow    10.1  1.9         Right Median Motor (Abd Poll Brev)  30.8C  Wrist    *4.8 <4.2 *2.2 >5 Elbow Wrist 5.1 21.5 *42 >50  Elbow    9.9  2.0         Left Ulnar Motor (Abd Dig Min)  32.2C  Wrist    3.3 <4.2 5.6 >3 B Elbow Wrist 3.8 20.0 53 >53  B Elbow    7.1  4.4  A Elbow B Elbow 2.0 10.0 *50 >53  A Elbow    9.1  2.8         Right Ulnar Motor (Abd Dig Min)  31.4C  Wrist    3.7 <4.2 4.1 >3 B Elbow Wrist 4.0 21.0 53 >53  B Elbow    7.7  3.5  A Elbow B Elbow 1.8 10.0 56 >53  A Elbow    9.5  2.7           EMG   Side Muscle Nerve Root Ins Act Fibs Psw Amp Dur Poly Recrt Int Bruna Comment  Right Abd Poll Brev Median C8-T1 *Incr *2+ *2+ Nml Nml 0 Nml Nml   Right 1stDorInt Ulnar C8-T1 Nml Nml Nml Nml Nml 0 Nml Nml   Right PronatorTeres Median C6-7 Nml Nml Nml Nml Nml 0 Nml Nml   Right Biceps Musculocut C5-6 Nml Nml Nml Nml Nml 0 Nml Nml   Right Deltoid Axillary C5-6 Nml Nml Nml Nml Nml 0 Nml Nml     Nerve Conduction Studies Anti Sensory Left/Right Comparison   Stim Site L Lat (ms) R Lat (ms) L-R Lat (ms) L Amp (V) R Amp (V) L-R Amp (%) Site1 Site2 L Vel (m/s) R Vel (m/s) L-R Vel (m/s)  Median Acr Palm Anti Sensory (2nd Digit)  31.9C  Wrist *4.7   *8.6   Wrist Palm     Palm             Radial Anti Sensory (Base 1st Digit)  32C  Wrist 2.5 2.5 0.0 16.4 6.4 61.0 Wrist Base 1st Digit     Ulnar Anti Sensory (5th Digit)  32.4C  Wrist *3.8 *6.0 *2.2 16.6 *8.6 48.2 Wrist 5th Digit *37 *23 14   Motor Left/Right Comparison   Stim Site L Lat (ms) R Lat (ms) L-R Lat (ms) L Amp (mV) R Amp (mV) L-R Amp (%) Site1 Site2 L Vel (m/s) R Vel (m/s) L-R Vel (m/s)  Median Motor (Abd Poll Brev)  32.2C  Wrist *4.8 *4.8 0.0 *2.5 *2.2 12.0 Elbow Wrist *41 *42 1  Elbow 10.1 9.9 0.2 1.9 2.0 5.0       Ulnar Motor (Abd Dig Min)  32.2C  Wrist 3.3 3.7 0.4 5.6 4.1 *26.8 B Elbow Wrist 53 53 0  B Elbow 7.1 7.7 0.6 4.4 3.5 20.5 A Elbow B Elbow *50 56 6  A Elbow 9.1 9.5 0.4 2.8 2.7 3.6          Waveforms:

## 2023-11-27 NOTE — Progress Notes (Unsigned)
 [name] - 89 y.o. female MRN [id number]  Date of birth: [birth year]  Office Visit Note: Visit Date: 11/23/2023 PCP: Delores Rojelio Caldron, NP Referred by: Delores Rojelio Caldron, NP  Subjective: Chief Complaint  Patient presents with   Right Hand - Numbness, Pain   Left Hand - Pain, Numbness   HPI: [name] is a 89 y.o. female who comes in todayHPI   ROS Otherwise per HPI.  Assessment & Plan: Visit Diagnoses:    ICD-10-CM   1. Paresthesia of skin  R20.2 NCV with EMG (electromyography)    Ambulatory referral to Orthopedic Surgery    2. Pain in left hand  M79.642 Ambulatory referral to Orthopedic Surgery    3. Pain in right hand  M79.641 Ambulatory referral to Orthopedic Surgery    4. Weakness of both hands  R29.898 Ambulatory referral to Orthopedic Surgery    5. Spinal stenosis of cervical region  M48.02 Ambulatory referral to Orthopedic Surgery       Plan: No additional findings.   Meds & Orders: No orders of the defined types were placed in this encounter.   Orders Placed This Encounter  Procedures   Ambulatory referral to Orthopedic Surgery   Ambulatory referral to Orthopedic Surgery   NCV with EMG (electromyography)    Follow-up: No follow-ups on file.   Procedures: No procedures performed      Clinical History: EXAM: MRI CERVICAL SPINE WITHOUT CONTRAST   TECHNIQUE: Multiplanar, multisequence MR imaging of the cervical spine was performed. No intravenous contrast was administered.   COMPARISON:  None Available.   FINDINGS: Alignment: Grade 1 anterolisthesis at C5-6   Vertebrae: No fracture, evidence of discitis, or bone lesion.   Cord: Normal signal and morphology.   Posterior Fossa, vertebral arteries, paraspinal tissues: Negative.   Disc levels:   C1-2: Unremarkable.   C2-3: Small disc bulge with bilateral uncovertebral hypertrophy. No spinal canal stenosis. Moderate bilateral neural foraminal stenosis.   C3-4:  Medium-sized disc bulge with large uncovertebral osteophytes. Severe spinal canal stenosis. Severe bilateral neural foraminal stenosis.   C4-5: Small disc bulge with uncovertebral hypertrophy. Moderate spinal canal stenosis. Moderate right and mild left neural foraminal stenosis.   C5-6: Small disc bulge with moderate bilateral uncovertebral hypertrophy. There is no spinal canal stenosis. Moderate bilateral neural foraminal stenosis.   C6-7: Small disc bulge. There is no spinal canal stenosis. Mild left neural foraminal stenosis.   C7-T1: Medium-sized disc bulge with uncovertebral hypertrophy. Mild spinal canal stenosis. Severe bilateral neural foraminal stenosis.   IMPRESSION: 1. Multilevel cervical degenerative disc disease with severe spinal canal stenosis at C3-4 and moderate spinal canal stenosis at C4-5. 2. Multilevel moderate to severe neural foraminal stenosis, worst at C3-4 and C7-T1.     Electronically Signed   By: Franky Stanford M.D.   On: 06/22/2021 00:11   She reports that she has never smoked. Her smokeless tobacco use includes snuff. No results for input(s): HGBA1C, LABURIC in the last 8760 hours.  Objective:  VS:  HT:    WT:   BMI:     BP:   HR: bpm  TEMP: ( )  RESP:  Physical Exam  Ortho Exam  Imaging: No results found.  Past Medical/Family/Surgical/Social History: Medications & Allergies reviewed per EMR, new medications updated. Patient Active Problem List   Diagnosis Date Noted   CHF exacerbation (HCC) 09/12/2020   Hypomagnesemia 09/12/2020   Acute on chronic diastolic CHF (congestive heart failure) (HCC) 09/11/2020   OSA on  CPAP    CKD (chronic kidney disease), stage III (HCC)    Anemia of chronic disease    Hypokalemia    Benign essential hypertension 07/15/2020   Malnutrition of moderate degree 07/09/2020   Chest pain of uncertain etiology    Unstable angina (HCC) 07/03/2020   Primary osteoarthritis, right ankle and foot 01/24/2018    Chronic idiopathic constipation 09/28/2017   Arthritis, multiple joint involvement 09/28/2017   Arthritis of right ankle 08/21/2017   Controlled type 2 diabetes mellitus with hyperglycemia, without long-term current use of insulin  (HCC) 08/17/2017   Allergic urticaria 09/07/2016   Generalized pruritus 09/07/2016   Moderate persistent asthma 09/07/2016   Perennial and seasonal allergic rhinitis 09/07/2016   History of breast cancer in female 01/12/2016   Chest pain 01/06/2015   Angina at rest 01/06/2015   Diabetes (HCC) 01/06/2015   Persistent atrial fibrillation (HCC) 01/06/2015   HLD (hyperlipidemia) 01/06/2015   Tobacco abuse 01/06/2015   Type II diabetes mellitus (HCC)    Painful total knee replacement 01/28/2013   Chronic obstructive asthma (HCC) 06/18/2012   Atrial fibrillation (HCC)    Left shoulder pain    Hypertension    Past Medical History:  Diagnosis Date   Arthritis    all over   Atrial fibrillation (HCC)    Diagnosed ~2009   Breast cancer, left breast (HCC) 02/07/1996   S/P chemo and mastectomy    CHF (congestive heart failure) (HCC)    GERD (gastroesophageal reflux disease)    Hyperlipidemia    Hypertension    Left shoulder pain    MRI showed pinched nerve - per pt   MI (myocardial infarction) (HCC)    OSA on CPAP    Pneumonia    4-5 times (01/06/2015)   Type II diabetes mellitus (HCC)    Family History  Problem Relation Age of Onset   Heart attack Mother        27s   Emphysema Brother    Allergic rhinitis Neg Hx    Angioedema Neg Hx    Asthma Neg Hx    Eczema Neg Hx    Immunodeficiency Neg Hx    Urticaria Neg Hx    Past Surgical History:  Procedure Laterality Date   BIOPSY  07/23/2022   Procedure: BIOPSY;  Surgeon: Kriss Estefana DEL, DO;  Location: [facility];  Service: Gastroenterology;;   BREAST BIOPSY Left 02/07/1996   CATARACT EXTRACTION W/ INTRAOCULAR LENS  IMPLANT, BILATERAL Bilateral    ESOPHAGOGASTRODUODENOSCOPY N/A  07/23/2022   Procedure: ESOPHAGOGASTRODUODENOSCOPY (EGD);  Surgeon: Kriss Estefana DEL, DO;  Location: [facility];  Service: Gastroenterology;  Laterality: N/A;   JOINT REPLACEMENT     LAPAROSCOPIC CHOLECYSTECTOMY     LEFT HEART CATH AND CORONARY ANGIOGRAPHY N/A 07/07/2020   Procedure: LEFT HEART CATH AND CORONARY ANGIOGRAPHY;  Surgeon: Dann Candyce RAMAN, MD;  Location: [facility] INVASIVE CV LAB;  Service: Cardiovascular;  Laterality: N/A;   MASTECTOMY Left 02/07/1996   TOTAL KNEE ARTHROPLASTY Left 02/07/1999   TOTAL KNEE REVISION Left 01/28/2013   Procedure: LEFT TOTAL KNEE ARTHROPLASTY REVISION;  Surgeon: Kay Ozell Cummins, MD;  Location: [facility];  Service: Orthopedics;  Laterality: Left;   Social History   Occupational History   Not on file  Tobacco Use   Smoking status: Never   Smokeless tobacco: Current    Types: Snuff  Vaping Use   Vaping status: Never Used  Substance and Sexual Activity   Alcohol use: Not Currently    Comment: 01/06/2015 I'll  have a beer q once in awhile   Drug use: No   Sexual activity: Never

## 2023-12-05 ENCOUNTER — Emergency Department (HOSPITAL_BASED_OUTPATIENT_CLINIC_OR_DEPARTMENT_OTHER)

## 2023-12-05 ENCOUNTER — Other Ambulatory Visit: Payer: Self-pay

## 2023-12-05 ENCOUNTER — Emergency Department (HOSPITAL_COMMUNITY)

## 2023-12-05 ENCOUNTER — Encounter (HOSPITAL_BASED_OUTPATIENT_CLINIC_OR_DEPARTMENT_OTHER): Payer: Self-pay | Admitting: Emergency Medicine

## 2023-12-05 ENCOUNTER — Observation Stay (HOSPITAL_BASED_OUTPATIENT_CLINIC_OR_DEPARTMENT_OTHER)
Admission: EM | Admit: 2023-12-05 | Discharge: 2023-12-07 | Disposition: A | Discharge status: Home / Self Care | Attending: Internal Medicine | Admitting: Internal Medicine

## 2023-12-05 DIAGNOSIS — J4489 Other specified chronic obstructive pulmonary disease: Secondary | ICD-10-CM | POA: Diagnosis present

## 2023-12-05 DIAGNOSIS — R4781 Slurred speech: Secondary | ICD-10-CM | POA: Diagnosis present

## 2023-12-05 DIAGNOSIS — I4891 Unspecified atrial fibrillation: Secondary | ICD-10-CM | POA: Diagnosis present

## 2023-12-05 DIAGNOSIS — I5022 Chronic systolic (congestive) heart failure: Secondary | ICD-10-CM | POA: Insufficient documentation

## 2023-12-05 DIAGNOSIS — I495 Sick sinus syndrome: Secondary | ICD-10-CM | POA: Insufficient documentation

## 2023-12-05 DIAGNOSIS — J449 Chronic obstructive pulmonary disease, unspecified: Secondary | ICD-10-CM | POA: Insufficient documentation

## 2023-12-05 DIAGNOSIS — E785 Hyperlipidemia, unspecified: Secondary | ICD-10-CM | POA: Diagnosis not present

## 2023-12-05 DIAGNOSIS — I4819 Other persistent atrial fibrillation: Secondary | ICD-10-CM | POA: Diagnosis not present

## 2023-12-05 DIAGNOSIS — Z853 Personal history of malignant neoplasm of breast: Secondary | ICD-10-CM | POA: Insufficient documentation

## 2023-12-05 DIAGNOSIS — D649 Anemia, unspecified: Secondary | ICD-10-CM | POA: Diagnosis not present

## 2023-12-05 DIAGNOSIS — I6389 Other cerebral infarction: Principal | ICD-10-CM | POA: Insufficient documentation

## 2023-12-05 DIAGNOSIS — Z79899 Other long term (current) drug therapy: Secondary | ICD-10-CM | POA: Insufficient documentation

## 2023-12-05 DIAGNOSIS — I10 Essential (primary) hypertension: Secondary | ICD-10-CM | POA: Diagnosis present

## 2023-12-05 DIAGNOSIS — E119 Type 2 diabetes mellitus without complications: Secondary | ICD-10-CM | POA: Diagnosis not present

## 2023-12-05 DIAGNOSIS — I639 Cerebral infarction, unspecified: Principal | ICD-10-CM | POA: Diagnosis present

## 2023-12-05 DIAGNOSIS — I110 Hypertensive heart disease with heart failure: Secondary | ICD-10-CM | POA: Insufficient documentation

## 2023-12-05 LAB — COMPREHENSIVE METABOLIC PANEL WITH GFR
ALT: <5 U/L (ref 0–44)
AST: 17 U/L (ref 15–41)
Albumin: 4 g/dL (ref 3.5–5.0)
Alkaline Phosphatase: 58 U/L (ref 38–126)
Anion gap: 13 (ref 5–15)
BUN: 11 mg/dL (ref 8–23)
CO2: 25 mmol/L (ref 22–32)
Calcium: 9.6 mg/dL (ref 8.9–10.3)
Chloride: 100 mmol/L (ref 98–111)
Creatinine, Ser: 0.88 mg/dL (ref 0.44–1.00)
GFR, Estimated: >60 mL/min (ref >60)
Glucose, Bld: 106 mg/dL — ABNORMAL HIGH (ref 70–99)
Potassium: 4 mmol/L (ref 3.5–5.1)
Sodium: 137 mmol/L (ref 135–145)
Total Bilirubin: 0.4 mg/dL (ref 0.0–1.2)
Total Protein: 8 g/dL (ref 6.5–8.1)

## 2023-12-05 LAB — DIFFERENTIAL
Abs Immature Granulocytes: 0.01 K/uL (ref 0.00–0.07)
Basophils Absolute: 0 K/uL (ref 0.0–0.1)
Basophils Relative: 0 %
Eosinophils Absolute: 0.1 K/uL (ref 0.0–0.5)
Eosinophils Relative: 1 %
Immature Granulocytes: 0 %
Lymphocytes Relative: 16 %
Lymphs Abs: 1.2 K/uL (ref 0.7–4.0)
Monocytes Absolute: 0.7 K/uL (ref 0.1–1.0)
Monocytes Relative: 10 %
Neutro Abs: 5.3 K/uL (ref 1.7–7.7)
Neutrophils Relative %: 73 %

## 2023-12-05 LAB — CBC
HCT: 33.3 % — ABNORMAL LOW (ref 36.0–46.0)
Hemoglobin: 10.6 g/dL — ABNORMAL LOW (ref 12.0–15.0)
MCH: 28.3 pg (ref 26.0–34.0)
MCHC: 31.8 g/dL (ref 30.0–36.0)
MCV: 88.8 fL (ref 80.0–100.0)
Platelets: 407 K/uL — ABNORMAL HIGH (ref 150–400)
RBC: 3.75 MIL/uL — ABNORMAL LOW (ref 3.87–5.11)
RDW: 14.7 % (ref 11.5–15.5)
WBC: 7.3 K/uL (ref 4.0–10.5)
nRBC: 0 % (ref 0.0–0.2)

## 2023-12-05 LAB — PROTIME-INR
INR: 1.9 — ABNORMAL HIGH (ref 0.8–1.2)
Prothrombin Time: 23.1 s — ABNORMAL HIGH (ref 11.4–15.2)

## 2023-12-05 LAB — CBG MONITORING, ED: Glucose-Capillary: 109 mg/dL — ABNORMAL HIGH (ref 70–99)

## 2023-12-05 LAB — APTT: aPTT: 59 s — ABNORMAL HIGH (ref 24–36)

## 2023-12-05 LAB — ETHANOL: Alcohol, Ethyl (B): <15 mg/dL (ref <15)

## 2023-12-05 MED ORDER — DIAZEPAM 5 MG/ML IJ SOLN
2.5000 mg | Freq: Once | INTRAMUSCULAR | Status: AC
  Administered 2023-12-05: 2.5 mg via INTRAVENOUS
  Filled 2023-12-05: qty 2

## 2023-12-05 MED ORDER — DIAZEPAM 5 MG/ML IJ SOLN: 5.0000 mg | Freq: Once | INTRAMUSCULAR | Status: DC

## 2023-12-05 MED ORDER — SODIUM CHLORIDE 0.9% FLUSH
3.0000 mL | Freq: Once | INTRAVENOUS | Status: AC
  Administered 2023-12-05: 3 mL via INTRAVENOUS
  Filled 2023-12-05: qty 3

## 2023-12-05 MED ORDER — ACETAMINOPHEN 325 MG PO TABS
650.0000 mg | ORAL_TABLET | Freq: Once | ORAL | Status: AC
  Administered 2023-12-05: 650 mg via ORAL
  Filled 2023-12-05: qty 2

## 2023-12-05 NOTE — ED Notes (Signed)
 Pt taken to MRI

## 2023-12-05 NOTE — H&P (Signed)
 History and Physical    [name] FMW:[id number] DOB: 01-Jul-1933 DOA: 12/05/2023  Patient coming from: Home.  Chief Complaint: Slurred speech.  HPI: [name] is a 89 y.o. female with history of persistent A-fib, chronic HFrEF with severe tricuspid regurgitation, anemia, prior history of breast cancer status post chemo and mastectomy, non-ST-elevation MI was brought to the ER after patient's daughter noticed that patient has been having slurring of speech over the last 24 hours.  Denies any weakness of the extremities.  Denies any visual symptoms.  Patient has been recently being followed by orthopedics for bilateral hand pain concerning for carpal tunnel syndrome.   Patient states she had missed her Xarelto  last 2 days.  ED Course: In the ER on exam patient is able to move all extremities without difficulty.  MRI of the brain shows small acute infarct involving the left insula.  Neurology on-call was consulted patient is being admitted for further workup.  EKG shows A-fib rate controlled.  Labs show hemoglobin of 10.6 creatinine 0.8.  Review of Systems: As per HPI, rest all negative.   Past Medical History:  Diagnosis Date   Arthritis    all over   Atrial fibrillation (HCC)    Diagnosed ~2009   Breast cancer, left breast (HCC) 02/07/1996   S/P chemo and mastectomy    CHF (congestive heart failure) (HCC)    GERD (gastroesophageal reflux disease)    Hyperlipidemia    Hypertension    Left shoulder pain    MRI showed pinched nerve - per pt   MI (myocardial infarction) (HCC)    OSA on CPAP    Pneumonia    4-5 times (01/06/2015)   Type II diabetes mellitus (HCC)     Past Surgical History:  Procedure Laterality Date   BIOPSY  07/23/2022   Procedure: BIOPSY;  Surgeon: Kriss Estefana DEL, DO;  Location: [facility];  Service: Gastroenterology;;   BREAST BIOPSY Left 02/07/1996   CATARACT EXTRACTION W/ INTRAOCULAR LENS  IMPLANT, BILATERAL Bilateral     ESOPHAGOGASTRODUODENOSCOPY N/A 07/23/2022   Procedure: ESOPHAGOGASTRODUODENOSCOPY (EGD);  Surgeon: Kriss Estefana DEL, DO;  Location: [facility];  Service: Gastroenterology;  Laterality: N/A;   JOINT REPLACEMENT     LAPAROSCOPIC CHOLECYSTECTOMY     LEFT HEART CATH AND CORONARY ANGIOGRAPHY N/A 07/07/2020   Procedure: LEFT HEART CATH AND CORONARY ANGIOGRAPHY;  Surgeon: Dann Candyce RAMAN, MD;  Location: [facility] INVASIVE CV LAB;  Service: Cardiovascular;  Laterality: N/A;   MASTECTOMY Left 02/07/1996   TOTAL KNEE ARTHROPLASTY Left 02/07/1999   TOTAL KNEE REVISION Left 01/28/2013   Procedure: LEFT TOTAL KNEE ARTHROPLASTY REVISION;  Surgeon: Kay Ozell Cummins, MD;  Location: [facility];  Service: Orthopedics;  Laterality: Left;     reports that she has never smoked. Her smokeless tobacco use includes snuff. She reports that she does not currently use alcohol. She reports that she does not use drugs.  Allergies  Allergen Reactions   Ferumoxytol  Itching and Other (See Comments)    Relieved with IV Benadryl       Chocolate Other (See Comments) and Cough    Sweating, too   Cocoa Other (See Comments) and Cough    Sweating, also   Lisinopril  Other (See Comments) and Cough    Sweating, too   Peanut Oil Cough    Family History  Problem Relation Age of Onset   Heart attack Mother        31s   Emphysema Brother    Allergic rhinitis Neg  Hx    Angioedema Neg Hx    Asthma Neg Hx    Eczema Neg Hx    Immunodeficiency Neg Hx    Urticaria Neg Hx     Prior to Admission medications   Medication Sig Start Date End Date Taking? Authorizing Provider  dorzolamide (TRUSOPT) 2 % ophthalmic solution Place 1 drop into both eyes 2 (two) times daily. 09/29/23  Yes [provider]  DULoxetine (CYMBALTA) 30 MG capsule Take 30 mg by mouth daily. 09/18/23  Yes [provider]  gabapentin  (NEURONTIN ) 600 MG tablet Take 600 mg by mouth 2 (two) times daily. 09/18/23  Yes [provider]   HYDROcodone -acetaminophen  (NORCO) 10-325 MG tablet Take 1 tablet by mouth every 6 (six) hours as needed. 11/26/23  Yes [provider]  hydrOXYzine (VISTARIL) 25 MG capsule Take 25 mg by mouth 3 (three) times daily. 11/29/23  Yes [provider]  meclizine (ANTIVERT) 25 MG tablet Take 25 mg by mouth daily as needed. 11/12/23  Yes [provider]  omeprazole  (PRILOSEC) 20 MG capsule Take 20 mg by mouth 2 (two) times daily. 10/14/23  Yes [provider]  albuterol  (VENTOLIN  HFA) 108 (90 Base) MCG/ACT inhaler Inhale 2 puffs into the lungs every 4 (four) hours as needed for wheezing or shortness of breath.    [provider]  alendronate (FOSAMAX) 70 MG tablet Take 70 mg by mouth once a week. 10/21/22   [provider]  atorvastatin  (LIPITOR) 40 MG tablet TAKE 1 TABLET BY MOUTH EVERY DAY 08/25/21   Wonda Sharper, MD  Cholecalciferol  (VITAMIN D ) 2000 units tablet Take 2,000 Units by mouth daily.    [provider]  conjugated estrogens  (PREMARIN ) vaginal cream Place 1 Applicatorful vaginally daily. 10/30/22   Emil Share, DO  diclofenac  Sodium (VOLTAREN ) 1 % GEL Apply 4 g topically 4 (four) times daily. Patient not taking: Reported on 08/02/2023 10/30/22   Emil Share, DO  FLOVENT  HFA 110 MCG/ACT inhaler TAKE 2 PUFFS BY MOUTH TWICE A DAY 03/21/17   Bobbitt, Elgin Pepper, MD  fluticasone  (FLONASE ) 50 MCG/ACT nasal spray Place 1 spray into both nostrils as needed. 03/14/21   [provider]  furosemide  (LASIX ) 40 MG tablet Take 1 tablet (40 mg total) by mouth daily. 12/21/22   Wonda Sharper, MD  gabapentin  (NEURONTIN ) 100 MG capsule Take 100-200 mg by mouth at bedtime. 06/22/20   [provider]  hydrocortisone  (ANUSOL -HC) 2.5 % rectal cream Place 1 Application rectally 2 (two) times daily. 10/12/21   Dean Clarity, MD  ketorolac  (ACULAR ) 0.5 % ophthalmic solution Place into both eyes daily. 11/02/20   [provider]   latanoprost (XALATAN) 0.005 % ophthalmic solution SMARTSIG:1 Drop(s) In Eye(s) Every Evening 11/17/22   [provider]  lidocaine  (LIDODERM ) 5 % Place 1 patch onto the skin daily. Remove & Discard patch within 12 hours or as directed by MD 09/29/23   Neysa Caron PARAS, DO  liver oil-zinc  oxide (DESITIN) 40 % ointment Apply topically as needed for irritation. 09/13/20   Vann, Jessica U, DO  losartan (COZAAR) 25 MG tablet Take 25 mg by mouth daily. 10/23/22   [provider]  metoprolol  succinate (TOPROL -XL) 25 MG 24 hr tablet Take 25 mg by mouth daily. 10/23/22   [provider]  metoprolol  tartrate (LOPRESSOR ) 25 MG tablet TAKE 1 TABLET BY MOUTH TWICE A DAY 09/22/21   Wonda Sharper, MD  pantoprazole  (PROTONIX ) 40 MG tablet Take 1 tablet (40 mg total) by mouth  daily. 07/23/22 08/02/23  Kriss Estefana DEL, DO  predniSONE  (STERAPRED UNI-PAK 21 TAB) 10 MG (21) TBPK tablet Take by mouth daily. Take 6 tabs by mouth daily  for 2 days, then 5 tabs for 2 days, then 4 tabs for 2 days, then 3 tabs for 2 days, 2 tabs for 2 days, then 1 tab by mouth daily for 2 days 10/27/23   Levander Houston, MD  Rivaroxaban  (XARELTO ) 15 MG TABS tablet Take 1 tablet (15 mg total) by mouth daily with supper. 07/24/22   Kriss Estefana DEL, DO  sucralfate  (CARAFATE ) 1 g tablet Take 1 tablet (1 g total) by mouth 4 (four) times daily for 7 days. Patient not taking: Reported on 08/02/2023 07/23/22 08/02/23  Kriss Estefana DEL, DO    Physical Exam: Constitutional: Moderately built and nourished. Vitals:   12/05/23 1352 12/05/23 1530 12/05/23 1634 12/05/23 1646  BP:  111/79 97/64 114/62  Pulse:  87 91 82  Resp:  19 16   Temp:   99.1 F (37.3 C)   TempSrc:      SpO2:  97% 99% 92%  Weight: 60.8 kg     Height: 5' 4 (1.626 m)      Eyes: Anicteric no pallor. ENMT: No discharge from the ears eyes nose or mouth. Neck: No mass felt.  No neck rigidity. Respiratory: No rhonchi or crepitations. Cardiovascular: S1-S2  heard. Abdomen: Soft nontender bowel sound present. Musculoskeletal: No edema. Skin: No rash. Neurologic: Alert awake oriented time place and person.  Moves all extremities 5 x 5.  No facial asymmetry tongue is midline pupils are equal and reacting to light. Psychiatric: Appears normal.  Normal affect.   Labs on Admission: I have personally reviewed following labs and imaging studies  CBC: Recent Labs  Lab 12/05/23 1357  WBC 7.3  NEUTROABS 5.3  HGB 10.6*  HCT 33.3*  MCV 88.8  PLT 407*   Basic Metabolic Panel: Recent Labs  Lab 12/05/23 1357  NA 137  K 4.0  CL 100  CO2 25  GLUCOSE 106*  BUN 11  CREATININE 0.88  CALCIUM  9.6   GFR: Estimated Creatinine Clearance: 38.2 mL/min (by C-G formula based on SCr of 0.88 mg/dL). Liver Function Tests: Recent Labs  Lab 12/05/23 1357  AST 17  ALT <5  ALKPHOS 58  BILITOT 0.4  PROT 8.0  ALBUMIN 4.0   No results for input(s): LIPASE, AMYLASE in the last 168 hours. No results for input(s): AMMONIA in the last 168 hours. Coagulation Profile: Recent Labs  Lab 12/05/23 1357  INR 1.9*   Cardiac Enzymes: No results for input(s): CKTOTAL, CKMB, CKMBINDEX, TROPONINI in the last 168 hours. BNP (last 3 results) Recent Labs    10/26/23 1912  PROBNP 1,335.0*   HbA1C: No results for input(s): HGBA1C in the last 72 hours. CBG: Recent Labs  Lab 12/05/23 1354  GLUCAP 109*   Lipid Profile: No results for input(s): CHOL, HDL, LDLCALC, TRIG, CHOLHDL, LDLDIRECT in the last 72 hours. Thyroid Function Tests: No results for input(s): TSH, T4TOTAL, FREET4, T3FREE, THYROIDAB in the last 72 hours. Anemia Panel: No results for input(s): VITAMINB12, FOLATE, FERRITIN, TIBC, IRON, RETICCTPCT in the last 72 hours. Urine analysis:    Component Value Date/Time   COLORURINE YELLOW 10/30/2022 1100   APPEARANCEUR CLEAR 10/30/2022 1100   LABSPEC 1.015 10/30/2022 1100   PHURINE 6.0 10/30/2022  1100   GLUCOSEU NEGATIVE 10/30/2022 1100   HGBUR TRACE (A) 10/30/2022 1100   BILIRUBINUR NEGATIVE 10/30/2022 1100  KETONESUR NEGATIVE 10/30/2022 1100   PROTEINUR NEGATIVE 10/30/2022 1100   UROBILINOGEN 1.0 12/03/2014 1059   NITRITE NEGATIVE 10/30/2022 1100   LEUKOCYTESUR NEGATIVE 10/30/2022 1100   Sepsis Labs: @LABRCNTIP (procalcitonin:4,lacticidven:4) )No results found for this or any previous visit (from the past 240 hours).   Radiological Exams on Admission: MR BRAIN WO CONTRAST Result Date: 12/05/2023 EXAM: MRI BRAIN WITHOUT CONTRAST 12/05/2023 08:50:25 PM TECHNIQUE: Multiplanar multisequence MRI of the head/brain was performed without the administration of intravenous contrast. COMPARISON: CT from earlier the same day. CLINICAL HISTORY: Neuro deficit, acute, stroke suspected. Since Sunday, patient has had difficulty with memory recall. She also began having difficulty speaking yesterday. Patient combative and agitated, all images sent. FINDINGS: BRAIN AND VENTRICLES: Generalized age-related atrophy with mild chronic microvascular ischemic disease. Patchy small volume restricted diffusion seen involving the left insular cortex and overlying left frontal lobe (series 5, images 54, 57) consistent with a small acute ischemic left MCA distribution infarct. No associated hemorrhage or mass effect. Single chronic microhemorrhage noted within the left temporal occipital region, of doubtful significance in isolation. No mass. No midline shift. No hydrocephalus. The sellar/suprasellar regions appear unremarkable. Normal flow voids. ORBITS: Prior bilateral ocular lens replacement. SINUSES AND MASTOIDS: No acute abnormality. BONES AND SOFT TISSUES: Normal marrow signal. No acute soft tissue abnormality. Advanced spondylosis within the visualized upper cervical spine. IMPRESSION: 1. Small acute ischemic infarcts involving the left insula and overlying left frontal lobe. No associated hemorrhage or mass  effect. 2. Underlying atrophy with mild chronic microvascular ischemic disease. Electronically signed by: Morene Hoard MD 12/05/2023 10:12 PM EDT RP Workstation: [id number]   CT HEAD WO CONTRAST Result Date: 12/05/2023 CLINICAL DATA:  Speech problems beginning yesterday. EXAM: CT HEAD WITHOUT CONTRAST TECHNIQUE: Contiguous axial images were obtained from the base of the skull through the vertex without intravenous contrast. RADIATION DOSE REDUCTION: This exam was performed according to the departmental dose-optimization program which includes automated exposure control, adjustment of the mA and/or kV according to patient size and/or use of iterative reconstruction technique. COMPARISON:  12/27/2022 FINDINGS: Brain: Ventricles, cisterns and other CSF spaces are normal. No evidence of mass, mass effect, shift of midline structures or acute hemorrhage. Mild chronic ischemic microvascular disease is present. Bilateral basal ganglia calcifications. Vascular: No hyperdense vessel or unexpected calcification. Skull: Normal. Negative for fracture or focal lesion. Sinuses/Orbits: No acute finding. Other: None. IMPRESSION: 1. No acute findings. 2. Mild chronic ischemic microvascular disease. Electronically Signed   By: Toribio Agreste M.D.   On: 12/05/2023 15:08    EKG: Independently reviewed.  A-fib rate controlled.  Assessment/Plan Principal Problem:   Acute CVA (cerebrovascular accident) (HCC)    Acute CVA -    discussed with neurologist.  Will keep patient on neurochecks.  Patient passed stroke swallow screen.  Check CT angiogram of head and neck 2D echo and per neurology okay to continue on Xarelto .  Lipid panel hemoglobin A1c pending.  Physical therapy consult. A-fib persistent on metoprolol  for rate control and Xarelto .  Patient had missed 2 dose of Xarelto  last 2 days. Hypertension will hold losartan given the acute CVA.  Will continue metoprolol . History of chronic HFrEF with reduced RVEF and  dilated RV with severe tricuspid regurgitation being followed by cardiology.  Follow-up 2D echo. Chronic anemia follow CBC. Prior history of breast cancer s/p mastectomy and chemo. Diabetes mellitus type 2 mentioned in the chart but not on any medication.  Check hemoglobin A1c.  Since patient has acute CVA will need close monitoring  further workup and more than 2 midnight stay.   DVT prophylaxis: Xarelto . Code Status: Full code. Family Communication: Patient's daughter. Disposition Plan: Monitored bed. Consults called: Neurology. Admission status: Observation.

## 2023-12-05 NOTE — ED Notes (Signed)
 Pt is highly agitated states she is unable to do MRI, MRI staff reports she became combative and almost fell into the floor, medications given, pt agreeable to try again after meds.

## 2023-12-05 NOTE — ED Provider Notes (Signed)
  Physical Exam  BP 114/62   Pulse 82   Temp 99.1 F (37.3 C)   Resp 16   Ht 5' 4 (1.626 m)   Wt 60.8 kg   SpO2 92%   BMI 23.00 kg/m   Physical Exam  Procedures  Procedures  ED Course / MDM    Medical Decision Making Care assumed upon transfer from [facility].  Patient had trouble speaking since yesterday.  MRI was pending upon transfer  11:06 PM MRI showed small acute infarcts of the left insula and left frontal lobe.  Dr. Michaela will consult and patient will be admitted  Problems Addressed: Cerebrovascular accident (CVA), unspecified mechanism [facility]): acute illness or injury  Amount and/or Complexity of Data Reviewed Labs: ordered. Decision-making details documented in ED Course. Radiology: ordered and independent interpretation performed. Decision-making details documented in ED Course.  Risk OTC drugs. Prescription drug management.         Patt Alm Macho, MD 12/05/23 [phone]

## 2023-12-05 NOTE — ED Notes (Signed)
 Pt family member questioning about MRI ETA. This RN reached out to MRI.

## 2023-12-05 NOTE — ED Provider Notes (Signed)
 Emergency Department Provider Note   I have reviewed the triage vital signs and the nursing notes.   HISTORY  Chief Complaint Aphasia   HPI [name] is a 89 y.o. female with past history reviewed below presents to the emergency department for evaluation of intermittent altered mental status and some slurred speech starting yesterday.  Last normal according to family at bedside was 2 days prior.  She is not having any difficulty with ambulation.  No unilateral weakness or numbness.  Family noticed some intermittent confusion and felt like her speech was somewhat slurred yesterday.  Of note, she did start some new pain medication in the past week.  She believes that she has been taking it correctly.  Family is unsure of the exact type but states that she is getting it from her pain management doctor. Patient currently has no complaints.    Past Medical History:  Diagnosis Date   Arthritis    all over   Atrial fibrillation [facility])    Diagnosed ~2009   Breast cancer, left breast (HCC) 02/07/1996   S/P chemo and mastectomy    CHF (congestive heart failure) (HCC)    GERD (gastroesophageal reflux disease)    Hyperlipidemia    Hypertension    Left shoulder pain    MRI showed pinched nerve - per pt   MI (myocardial infarction) (HCC)    OSA on CPAP    Pneumonia    4-5 times (01/06/2015)   Type II diabetes mellitus (HCC)     Review of Systems  Constitutional: No fever/chills Cardiovascular: Denies chest pain. Respiratory: Denies shortness of breath. Gastrointestinal: No abdominal pain.  No nausea, no vomiting.   Genitourinary: Negative for dysuria. Musculoskeletal: Positive chronic neck pain.  Skin: Negative for rash. Neurological: Negative for headaches, focal weakness or numbness. Positive intermittent slurred speech.    ____________________________________________   PHYSICAL EXAM:  VITAL SIGNS: ED Triage Vitals  Encounter Vitals Group     BP 12/05/23  1351 129/63     Pulse Rate 12/05/23 1351 100     Resp 12/05/23 1351 16     Temp 12/05/23 1351 98.2 F (36.8 C)     Temp Source 12/05/23 1351 Oral     SpO2 12/05/23 1351 98 %     Weight 12/05/23 1352 134 lb (60.8 kg)     Height 12/05/23 1352 5' 4 (1.626 m)   Constitutional: Alert and oriented. Well appearing and in no acute distress. Eyes: Conjunctivae are normal.  Head: Atraumatic. Nose: No congestion/rhinnorhea. Mouth/Throat: Mucous membranes are moist.   Neck: No stridor.   Cardiovascular: Normal rate, regular rhythm. Good peripheral circulation. Grossly normal heart sounds.   Respiratory: Normal respiratory effort.  No retractions. Lungs CTAB. Gastrointestinal: Soft and nontender. No distention.  Musculoskeletal: No gross deformities of extremities. Neurologic:  Normal speech and language. No gross focal neurologic deficits are appreciated.  Skin:  Skin is warm, dry and intact. No rash noted.   ____________________________________________   LABS (all labs ordered are listed, but only abnormal results are displayed)  Labs Reviewed  PROTIME-INR - Abnormal; Notable for the following components:      Result Value   Prothrombin Time 23.1 (*)    INR 1.9 (*)    All other components within normal limits  APTT - Abnormal; Notable for the following components:   aPTT 59 (*)    All other components within normal limits  CBC - Abnormal; Notable for the following components:   RBC 3.75 (*)  Hemoglobin 10.6 (*)    HCT 33.3 (*)    Platelets 407 (*)    All other components within normal limits  COMPREHENSIVE METABOLIC PANEL WITH GFR - Abnormal; Notable for the following components:   Glucose, Bld 106 (*)    All other components within normal limits  CBG MONITORING, ED - Abnormal; Notable for the following components:   Glucose-Capillary 109 (*)    All other components within normal limits  DIFFERENTIAL  ETHANOL  URINALYSIS, W/ REFLEX TO CULTURE (INFECTION SUSPECTED)    ____________________________________________  EKG   EKG Interpretation Date/Time:  Wednesday December 05 2023 13:57:50 EDT Ventricular Rate:  89 PR Interval:    QRS Duration:  97 QT Interval:  355 QTC Calculation: 432 R Axis:   150  Text Interpretation: Atrial fibrillation Ventricular premature complex Probable right ventricular hypertrophy Nonspecific T abnormalities, diffuse leads Confirmed by Darra Chew [phone]) on 12/05/2023 2:28:16 PM        ____________________________________________  RADIOLOGY  CT HEAD WO CONTRAST Result Date: 12/05/2023 CLINICAL DATA:  Speech problems beginning yesterday. EXAM: CT HEAD WITHOUT CONTRAST TECHNIQUE: Contiguous axial images were obtained from the base of the skull through the vertex without intravenous contrast. RADIATION DOSE REDUCTION: This exam was performed according to the departmental dose-optimization program which includes automated exposure control, adjustment of the mA and/or kV according to patient size and/or use of iterative reconstruction technique. COMPARISON:  12/27/2022 FINDINGS: Brain: Ventricles, cisterns and other CSF spaces are normal. No evidence of mass, mass effect, shift of midline structures or acute hemorrhage. Mild chronic ischemic microvascular disease is present. Bilateral basal ganglia calcifications. Vascular: No hyperdense vessel or unexpected calcification. Skull: Normal. Negative for fracture or focal lesion. Sinuses/Orbits: No acute finding. Other: None. IMPRESSION: 1. No acute findings. 2. Mild chronic ischemic microvascular disease. Electronically Signed   By: Toribio Agreste M.D.   On: 12/05/2023 15:08    ____________________________________________   PROCEDURES  Procedure(s) performed:   Procedures  None  ____________________________________________   INITIAL IMPRESSION / ASSESSMENT AND PLAN / ED COURSE  Pertinent labs & imaging results that were available during my care of the patient were  reviewed by me and considered in my medical decision making (see chart for details).   This patient is Presenting for Evaluation of AMS, which does require a range of treatment options, and is a complaint that involves a high risk of morbidity and mortality.  The Differential Diagnoses includes but is not exclusive to alcohol, illicit or prescription medications, intracranial pathology such as stroke, intracerebral hemorrhage, fever or infectious causes including sepsis, hypoxemia, uremia, trauma, endocrine related disorders such as diabetes, hypoglycemia, thyroid-related diseases, etc.   Critical Interventions-    Medications  sodium chloride  flush (NS) 0.9 % injection 3 mL (has no administration in time range)    Reassessment after intervention:  symptoms improved.    I did obtain Additional Historical Information from daughter at bedside.    Clinical Laboratory Tests Ordered, included CBC without leukocytosis or severe anemia.  No acute kidney injury.  Radiologic Tests Ordered, included CT head. I independently interpreted the images and agree with radiology interpretation.   Cardiac Monitor Tracing which shows NSR.    Social Determinants of Health Risk patient is a smoker.   Consult complete with Dr. Curatolo who accepts patient to [facility] for MRI.   Medical Decision Making: Summary:  The patient presents emergency department for evaluation of altered mental status.  She is awake, alert, oriented on my assessment.  Family feel  like her speech is different.  I suspect that this may be related to recently starting pain medication for chronic pain in her neck.  Reevaluation with update and discussion with patient and family at bedside.  Plan for transfer to [facility] for MRI.  If negative for acute process patient can be discharged with plan for close outpatient follow-up.   Patient's presentation is most consistent with acute presentation with potential threat to life or bodily  function.   Disposition: transfer  ____________________________________________  FINAL CLINICAL IMPRESSION(S) / ED DIAGNOSES  Final diagnoses:  Episode of change in speech    Note:  This document was prepared using Dragon voice recognition software and may include unintentional dictation errors.  Fonda Law, MD, [facility] Emergency Medicine

## 2023-12-05 NOTE — ED Triage Notes (Incomplete)
 Pt with daughter- daughter reports slurred speech since yesterday, worsening today.   Pt AOx4

## 2023-12-05 NOTE — ED Triage Notes (Signed)
 Pt arrives with family from [facility] for an MRI. Family reports since Sunday, patient has had difficulty with memory recall. She also began having difficulty speaking yesterday.

## 2023-12-05 NOTE — ED Notes (Signed)
 Pt transported to [facility] ED pov. Pt alert and oriented at the time of transfer. RR even and unlabored. No acute distress noted. Pt & family member verbalized understanding of transfer instructions as discussed. Pt transported to lobby in wheelchair at time of transfer.  IV secured for transfer per providers orders.

## 2023-12-05 NOTE — ED Notes (Signed)
 This RN contacted MRI to inform them that the patient has arrived.

## 2023-12-06 ENCOUNTER — Other Ambulatory Visit: Payer: Self-pay

## 2023-12-06 ENCOUNTER — Observation Stay (HOSPITAL_COMMUNITY)

## 2023-12-06 ENCOUNTER — Encounter (HOSPITAL_COMMUNITY): Payer: Self-pay | Admitting: Internal Medicine

## 2023-12-06 DIAGNOSIS — E119 Type 2 diabetes mellitus without complications: Secondary | ICD-10-CM | POA: Diagnosis not present

## 2023-12-06 DIAGNOSIS — T45516A Underdosing of anticoagulants, initial encounter: Secondary | ICD-10-CM | POA: Diagnosis not present

## 2023-12-06 DIAGNOSIS — D649 Anemia, unspecified: Secondary | ICD-10-CM | POA: Diagnosis not present

## 2023-12-06 DIAGNOSIS — I6389 Other cerebral infarction: Secondary | ICD-10-CM

## 2023-12-06 DIAGNOSIS — I639 Cerebral infarction, unspecified: Secondary | ICD-10-CM | POA: Diagnosis not present

## 2023-12-06 DIAGNOSIS — R29702 NIHSS score 2: Secondary | ICD-10-CM

## 2023-12-06 DIAGNOSIS — I4891 Unspecified atrial fibrillation: Secondary | ICD-10-CM | POA: Diagnosis not present

## 2023-12-06 DIAGNOSIS — Z91148 Patient's other noncompliance with medication regimen for other reason: Secondary | ICD-10-CM

## 2023-12-06 DIAGNOSIS — J449 Chronic obstructive pulmonary disease, unspecified: Secondary | ICD-10-CM | POA: Diagnosis not present

## 2023-12-06 LAB — COMPREHENSIVE METABOLIC PANEL WITH GFR
ALT: 7 U/L (ref 0–44)
AST: 16 U/L (ref 15–41)
Albumin: 3.1 g/dL — ABNORMAL LOW (ref 3.5–5.0)
Alkaline Phosphatase: 46 U/L (ref 38–126)
Anion gap: 15 (ref 5–15)
BUN: 9 mg/dL (ref 8–23)
CO2: 23 mmol/L (ref 22–32)
Calcium: 8.8 mg/dL — ABNORMAL LOW (ref 8.9–10.3)
Chloride: 96 mmol/L — ABNORMAL LOW (ref 98–111)
Creatinine, Ser: 0.77 mg/dL (ref 0.44–1.00)
GFR, Estimated: >60 mL/min (ref >60)
Glucose, Bld: 87 mg/dL (ref 70–99)
Potassium: 3.7 mmol/L (ref 3.5–5.1)
Sodium: 134 mmol/L — ABNORMAL LOW (ref 135–145)
Total Bilirubin: 0.7 mg/dL (ref 0.0–1.2)
Total Protein: 5.6 g/dL — ABNORMAL LOW (ref 6.5–8.1)

## 2023-12-06 LAB — ECHOCARDIOGRAM COMPLETE
AR max vel: 1.68 cm2
AV Peak grad: 6.2 mmHg
Ao pk vel: 1.24 m/s
Area-P 1/2: 3.53 cm2
Height: 64 in
S' Lateral: 2.6 cm
Weight: 2144 [oz_av]

## 2023-12-06 LAB — CBC
HCT: 32.3 % — ABNORMAL LOW (ref 36.0–46.0)
Hemoglobin: 10.3 g/dL — ABNORMAL LOW (ref 12.0–15.0)
MCH: 28.1 pg (ref 26.0–34.0)
MCHC: 31.9 g/dL (ref 30.0–36.0)
MCV: 88 fL (ref 80.0–100.0)
Platelets: 393 K/uL (ref 150–400)
RBC: 3.67 MIL/uL — ABNORMAL LOW (ref 3.87–5.11)
RDW: 14.6 % (ref 11.5–15.5)
WBC: 6.4 K/uL (ref 4.0–10.5)
nRBC: 0 % (ref 0.0–0.2)

## 2023-12-06 LAB — LIPID PANEL
Cholesterol: 137 mg/dL (ref 0–200)
HDL: 48 mg/dL (ref >40)
LDL Cholesterol: 77 mg/dL (ref 0–99)
Total CHOL/HDL Ratio: 2.9 ratio
Triglycerides: 59 mg/dL (ref <150)
VLDL: 12 mg/dL (ref 0–40)

## 2023-12-06 LAB — GLUCOSE, CAPILLARY
Glucose-Capillary: 101 mg/dL — ABNORMAL HIGH (ref 70–99)
Glucose-Capillary: 131 mg/dL — ABNORMAL HIGH (ref 70–99)

## 2023-12-06 LAB — HEMOGLOBIN A1C
Hgb A1c MFr Bld: 5.8 % — ABNORMAL HIGH (ref 4.8–5.6)
Mean Plasma Glucose: 119.76 mg/dL

## 2023-12-06 MED ORDER — ATORVASTATIN CALCIUM 40 MG PO TABS
40.0000 mg | ORAL_TABLET | Freq: Every day | ORAL | Status: DC
  Administered 2023-12-06 – 2023-12-07 (×2): 40 mg via ORAL
  Filled 2023-12-06 (×2): qty 1

## 2023-12-06 MED ORDER — STROKE: EARLY STAGES OF RECOVERY BOOK
Freq: Once | Status: AC
  Administered 2023-12-07: 1
  Filled 2023-12-06: qty 1

## 2023-12-06 MED ORDER — IOHEXOL 350 MG/ML SOLN
75.0000 mL | Freq: Once | INTRAVENOUS | Status: AC | PRN
  Administered 2023-12-06: 75 mL via INTRAVENOUS

## 2023-12-06 MED ORDER — METOPROLOL TARTRATE 25 MG PO TABS
25.0000 mg | ORAL_TABLET | Freq: Two times a day (BID) | ORAL | Status: DC
  Administered 2023-12-06 – 2023-12-07 (×3): 25 mg via ORAL
  Filled 2023-12-06 (×3): qty 1

## 2023-12-06 MED ORDER — SODIUM CHLORIDE 0.9 % IV SOLN: INTRAVENOUS | Status: AC

## 2023-12-06 MED ORDER — ACETAMINOPHEN 325 MG PO TABS
650.0000 mg | ORAL_TABLET | ORAL | Status: DC | PRN
  Administered 2023-12-06 – 2023-12-07 (×3): 650 mg via ORAL
  Filled 2023-12-06 (×3): qty 2

## 2023-12-06 MED ORDER — ACETAMINOPHEN 160 MG/5ML PO SOLN: 650.0000 mg | ORAL | Status: DC | PRN

## 2023-12-06 MED ORDER — ACETAMINOPHEN 650 MG RE SUPP: 650.0000 mg | RECTAL | Status: DC | PRN

## 2023-12-06 MED ORDER — HYDROCODONE-ACETAMINOPHEN 10-325 MG PO TABS
1.0000 | ORAL_TABLET | Freq: Once | ORAL | Status: AC
  Administered 2023-12-06: 1 via ORAL
  Filled 2023-12-06: qty 1

## 2023-12-06 MED ORDER — RIVAROXABAN 15 MG PO TABS
15.0000 mg | ORAL_TABLET | Freq: Every day | ORAL | Status: DC
  Administered 2023-12-06: 15 mg via ORAL
  Filled 2023-12-06 (×2): qty 1

## 2023-12-06 NOTE — Care Management Obs Status (Signed)
 MEDICARE OBSERVATION STATUS NOTIFICATION   Patient Details  Name: [name] MRN: [id number] Date of Birth: March 13, 1933   Medicare Observation Status Notification Given:  Yes  Verbally reviewed observation notice with Nanetta Row telephonically at [phone].  Will mail a copy to the home address.      Chelly Dombeck 12/06/2023, 2:27 PM

## 2023-12-06 NOTE — TOC CAGE-AID Note (Signed)
 Transition of Care [facility]) - CAGE-AID Screening   Patient Details  Name: [name] MRN: [id number] Date of Birth: Dec 02, 1933  Transition of Care [facility]) CM/SW Contact:    Damany Eastman E Journee Kohen, LCSW Phone Number: 12/06/2023, 3:04 PM   Clinical Narrative: No SA noted.   CAGE-AID Screening:    Have You Ever Felt You Ought to Cut Down on Your Drinking or Drug Use?: No Have People Annoyed You By Critizing Your Drinking Or Drug Use?: No Have You Felt Bad Or Guilty About Your Drinking Or Drug Use?: No Have You Ever Had a Drink or Used Drugs First Thing In The Morning to Steady Your Nerves or to Get Rid of a Hangover?: No CAGE-AID Score: 0  Substance Abuse Education Offered: No

## 2023-12-06 NOTE — Progress Notes (Signed)
 SLP Cancellation Note  Patient Details Name: AUNDRIA BITTERMAN MRN: [id number] DOB: Dec 28, 1933   Cancelled treatment:       Reason Eval/Treat Not Completed: Patient at procedure or test/unavailable. Will f/u as able.    Leita SAILOR., M.A. CCC-SLP Acute Rehabilitation Services Office: [phone]  Secure chat preferred  12/06/2023, 10:46 AM

## 2023-12-06 NOTE — Evaluation (Signed)
 Occupational Therapy Evaluation and Discharge Patient Details Name: [name] MRN: [id number] DOB: 1933/06/02 Today's Date: 12/06/2023   History of Present Illness   Pt is an 89 year old woman who presented on 12/05/23 with slurred speech. MRI + for small acute ischemic infarcts involving L insula and L frontal lobe. PMH: afib, CHF, severe tricuspid regurgitation, anemia, breast ca, B carpal tunnel.     Clinical Impressions Pt ambulates with a rollator at baseline. She self feeds, grooms and toilets modified independently, she is otherwise dependent on her personal care attendant or family for bathing, dressing and all IADLs. Pt expressed pain briefly in R hand during ambulation, hx of carpal tunnel for which she wears night splints. She mobilized with supervision with tendency to veer R, but able to self correct without physical assist. Pt's report of PLOF, home set up and assistance at home appeared accurate, her family manages her medications and finances. No further OT needs, recommend ADLs with nursing staff.      If plan is discharge home, recommend the following:   A little help with walking and/or transfers;A lot of help with bathing/dressing/bathroom;Assistance with cooking/housework;Assistance with feeding;Direct supervision/assist for medications management;Direct supervision/assist for financial management;Assist for transportation;Help with stairs or ramp for entrance     Functional Status Assessment   Patient has not had a recent decline in their functional status     Equipment Recommendations   None recommended by OT     Recommendations for Other Services         Precautions/Restrictions   Precautions Precautions: Fall Recall of Precautions/Restrictions: Intact     Mobility Bed Mobility               General bed mobility comments: NT, pt received in bathroom and returned    Transfers Overall transfer level: Needs  assistance Equipment used: Rolling walker (2 wheels) Transfers: Sit to/from Stand Sit to Stand: Supervision                  Balance Overall balance assessment: Needs assistance   Sitting balance-Leahy Scale: Good       Standing balance-Leahy Scale: Fair Standing balance comment: can release walker in static standing without LOB                           ADL either performed or assessed with clinical judgement   ADL Overall ADL's : Needs assistance/impaired Eating/Feeding: Independent;Sitting   Grooming: Minimal assistance;Standing;Wash/dry hands Grooming Details (indicate cue type and reason): assist to operate soap and towel dispensers                 Toilet Transfer: Supervision/safety;Ambulation;Rolling walker (2 wheels);Regular Toilet   Toileting- Clothing Manipulation and Hygiene: Supervision/safety;Sit to/from stand       Functional mobility during ADLs: Supervision/safety;Rolling walker (2 wheels) General ADL Comments: pt with baseline dependence in bathing and dressing, not assessed     Vision Baseline Vision/History: 1 Wears glasses Ability to See in Adequate Light: 0 Adequate Patient Visual Report: No change from baseline       Perception         Praxis         Pertinent Vitals/Pain Pain Assessment Pain Assessment: Faces Faces Pain Scale: Hurts little more Pain Location: R hand Pain Descriptors / Indicators: Grimacing, Guarding Pain Intervention(s): Repositioned     Extremity/Trunk Assessment Upper Extremity Assessment Upper Extremity Assessment: Right hand dominant;Overall [facility] for tasks assessed  Lower Extremity Assessment Lower Extremity Assessment: Defer to PT evaluation       Communication Communication Communication: Impaired Factors Affecting Communication: Hearing impaired   Cognition Arousal: Alert Behavior During Therapy: WFL for tasks assessed/performed Cognition: No family/caregiver present to  determine baseline             OT - Cognition Comments: pt able answer PLOF and home set up questions appropriately, needing some repetition of commands                 Following commands: Intact       Cueing  General Comments   Cueing Techniques: Verbal cues;Gestural cues      Exercises     Shoulder Instructions      Home Living Family/patient expects to be discharged to:: Private residence Living Arrangements: Children Available Help at Discharge: Family;Personal care attendant;Available PRN/intermittently (aide 3 hours a day, 5 days a week, daughter leaves for only short periods of time) Type of Home: House Home Access: Level entry     Home Layout: Two level;Able to live on main level with bedroom/bathroom     Bathroom Shower/Tub: Producer, Television/film/video: Handicapped height     Home Equipment: Rollator (4 wheels);Cane - quad;Cane - single point;Shower seat;Other (comment) (B wrist splints for night wear)          Prior Functioning/Environment Prior Level of Function : Needs assist             Mobility Comments: walks with a rollator ADLs Comments: assisted for bathing, dressing med management and all IADLs    OT Problem List: Impaired balance (sitting and/or standing);Decreased strength   OT Treatment/Interventions:        OT Goals(Current goals can be found in the care plan section)   Acute Rehab OT Goals OT Goal Formulation: With patient   OT Frequency:       Co-evaluation              AM-PAC OT 6 Clicks Daily Activity     Outcome Measure Help from another person eating meals?: None Help from another person taking care of personal grooming?: A Little Help from another person toileting, which includes using toliet, bedpan, or urinal?: A Little Help from another person bathing (including washing, rinsing, drying)?: Total Help from another person to put on and taking off regular upper body clothing?: A Lot Help  from another person to put on and taking off regular lower body clothing?: Total 6 Click Score: 14   End of Session Equipment Utilized During Treatment: Gait belt;Rolling walker (2 wheels)  Activity Tolerance: Patient tolerated treatment well Patient left: with call bell/phone within reach;Other (comment) (in bathroom)  OT Visit Diagnosis: Other abnormalities of gait and mobility (R26.89)                Time: 9093-9084 OT Time Calculation (min): 9 min Charges:  OT General Charges $OT Visit: 1 Visit OT Evaluation $OT Eval Moderate Complexity: 1 Mod  Mliss HERO, OTR/L Acute Rehabilitation Services Office: [phone]   Kennth Mliss Helling 12/06/2023, 10:52 AM

## 2023-12-06 NOTE — Plan of Care (Signed)
  Problem: Education: Goal: Knowledge of disease or condition will improve Outcome: Progressing Goal: Knowledge of secondary prevention will improve (MUST DOCUMENT ALL) Outcome: Progressing   Problem: Coping: Goal: Will verbalize positive feelings about self Outcome: Progressing

## 2023-12-06 NOTE — ED Notes (Signed)
 Hospital bed ordered for patient.

## 2023-12-06 NOTE — ED Notes (Signed)
 CCMD called to report patient transfer to RM 3w23.

## 2023-12-06 NOTE — Progress Notes (Signed)
 CCMD notified RN that patient had 2 x 2 second pauses at 1700 & 1800 hours. RN rounded, patient lying in bed, sleeping with daughter at bedside. RN notified and made MD aware. MD ordered EKG. Cards to be consulted in the morning.   1930: RN obtained EKG. Oncoming RN made aware of EKG completion.

## 2023-12-06 NOTE — TOC Initial Note (Signed)
 Transition of Care [facility]) - Initial/Assessment Note    Patient Details  Name: [name] MRN: [id number] Date of Birth: 1933-04-02  Transition of Care [facility]) CM/SW Contact:    Andrez JULIANNA George, RN Phone Number: 12/06/2023, 3:23 PM  Clinical Narrative:                  [name] is a 89 y.o. female with history of persistent A-fib, chronic HFrEF with severe tricuspid regurgitation, anemia, prior history of breast cancer status post chemo and mastectomy, non-ST-elevation MI was brought to the ER after patient's daughter noticed that patient has been having slurring of speech over the last 24 hours.   Pt is from home with her daughter. Daughter works during the daytime but pt has caregiver 9a-3pm Monday-Friday. Daughter oversees her medications and does admit to not following up with them lately very well. She plans to over see them much better after d/c.  Daughter provides needed transportation.   Home health recommended. CM provided choice and they had no preference. [facility] accepted. Information on the AVS and [facility] will contact them for the first home visit.  IP Care management following.  Expected Discharge Plan: Home w Home Health Services Barriers to Discharge: Continued Medical Work up   Patient Goals and CMS Choice   CMS Medicare.gov Compare Post Acute Care list provided to:: Patient Represenative (must comment) Choice offered to / list presented to : Adult Children, Patient      Expected Discharge Plan and Services   Discharge Planning Services: CM Consult Post Acute Care Choice: Home Health Living arrangements for the past 2 months: Single Family Home                           HH Arranged: PT, Speech Therapy HH Agency: [facility] Date [employer] [facility] [employer] Agency Contacted: 12/06/23   Representative spoke with at [facility] Agency: amy  Prior Living Arrangements/Services Living arrangements for the past 2 months: Single Family Home Lives with:: Adult  Children Patient language and need for interpreter reviewed:: Yes Do you feel safe going back to the place where you live?: Yes        Care giver support system in place?: Yes (comment) Current home services: DME (walker/ cane/ stair lift/ elevated commode/ lift chair/ adjustable bed) Criminal Activity/Legal Involvement Pertinent to Current Situation/Hospitalization: No - Comment as needed  Activities of Daily Living   ADL Screening (condition at time of admission) Independently performs ADLs?: No Does the patient have a NEW difficulty with bathing/dressing/toileting/self-feeding that is expected to last >3 days?: No Does the patient have a NEW difficulty with getting in/out of bed, walking, or climbing stairs that is expected to last >3 days?: No Does the patient have a NEW difficulty with communication that is expected to last >3 days?: No Is the patient deaf or have difficulty hearing?: Yes Does the patient have difficulty seeing, even when wearing glasses/contacts?: No Does the patient have difficulty concentrating, remembering, or making decisions?: No  Permission Sought/Granted                  Emotional Assessment Appearance:: Appears stated age Attitude/Demeanor/Rapport:  (dysarthric)   Orientation: : Oriented to Self   Psych Involvement: No (comment)  Admission diagnosis:  Acute CVA (cerebrovascular accident) [facility]) [I63.9] Cerebrovascular accident (CVA), unspecified mechanism (HCC) [I63.9] Patient Active Problem List   Diagnosis Date Noted   Acute CVA (cerebrovascular accident) (HCC) 12/05/2023  CHF exacerbation (HCC) 09/12/2020   Hypomagnesemia 09/12/2020   Acute on chronic diastolic CHF (congestive heart failure) (HCC) 09/11/2020   OSA on CPAP    CKD (chronic kidney disease), stage III (HCC)    Anemia    Hypokalemia    Benign essential hypertension 07/15/2020   Malnutrition of moderate degree 07/09/2020   Chest pain of uncertain etiology    Unstable  angina (HCC) 07/03/2020   Primary osteoarthritis, right ankle and foot 01/24/2018   Chronic idiopathic constipation 09/28/2017   Arthritis, multiple joint involvement 09/28/2017   Arthritis of right ankle 08/21/2017   Controlled type 2 diabetes mellitus with hyperglycemia, without long-term current use of insulin  (HCC) 08/17/2017   Allergic urticaria 09/07/2016   Generalized pruritus 09/07/2016   Moderate persistent asthma 09/07/2016   Perennial and seasonal allergic rhinitis 09/07/2016   History of breast cancer in female 01/12/2016   Chest pain 01/06/2015   Angina at rest 01/06/2015   Diabetes (HCC) 01/06/2015   Persistent atrial fibrillation (HCC) 01/06/2015   HLD (hyperlipidemia) 01/06/2015   Tobacco abuse 01/06/2015   Type II diabetes mellitus (HCC)    Painful total knee replacement 01/28/2013   Chronic obstructive asthma (HCC) 06/18/2012   Atrial fibrillation (HCC)    Left shoulder pain    Hypertension    PCP:  Delores Rojelio Caldron, NP Pharmacy:   [id number] - [place], [place] - [address] [place] [address] Phone: [phone] Fax: [phone]     Social Drivers of Health (SDOH) Social History: SDOH Screenings   Food Insecurity: Patient Declined (12/06/2023)  Transportation Needs: No Transportation Needs (06/13/2022)   Received from [facility]  Utilities: Not At Risk (06/13/2022)   Received from [facility]  Financial Resource Strain: Low Risk  (06/13/2022)   Received from [facility]  Social Connections: Unknown (05/16/2022)   Received from [facility]  Tobacco Use: High Risk (12/06/2023)   SDOH Interventions:     Readmission Risk Interventions     No data to display

## 2023-12-06 NOTE — Evaluation (Signed)
 Physical Therapy Evaluation Patient Details Name: [name] MRN: [id number] DOB: May 17, 1933 Today's Date: 12/06/2023  History of Present Illness  Pt is an 89 year old woman who presented on 12/05/23 with slurred speech. MRI + for small acute ischemic infarcts involving L insula and L frontal lobe. PMH: afib, CHF, severe tricuspid regurgitation, anemia, breast ca, B carpal tunnel.   Clinical Impression  PTA pt was ModI with use of rollator with assist for bathing, dressing, and all IADs. Pt reports being at functional mobility baseline with supervision to stand and ambulate 315ft with use of RW. Pt occasionally would drift to the right, however, able to correct with no assist. Pt has close to 24/7 assist at home between daughter and aides. Recommending return home with HHPT for home safety evaluation and to address functional limitations. Pt has no further acute PT needs with Acute PT signing off. While in the hospital, recommend pt ambulate with mobility specialists and staff. Please re-consult if there are any changes in status.       If plan is discharge home, recommend the following: A little help with walking and/or transfers;Assist for transportation;Help with stairs or ramp for entrance;A little help with bathing/dressing/bathroom   Can travel by private vehicle    Yes    Equipment Recommendations None recommended by PT     Functional Status Assessment Patient has not had a recent decline in their functional status     Precautions / Restrictions Precautions Precautions: Fall Recall of Precautions/Restrictions: Intact Restrictions Weight Bearing Restrictions Per Provider Order: No      Mobility  Bed Mobility    General bed mobility comments: NT, pt received in bathroom and returned    Transfers Overall transfer level: Needs assistance Equipment used: Rolling walker (2 wheels) Transfers: Sit to/from Stand Sit to Stand: Supervision      General transfer  comment: for safety    Ambulation/Gait Ambulation/Gait assistance: Supervision Gait Distance (Feet): 300 Feet Assistive device: Rolling walker (2 wheels) Gait Pattern/deviations: Step-through pattern, Decreased stride length, Drifts right/left Gait velocity: decr     General Gait Details: steady with use of RW, occasionally would drift towards the right    Modified Rankin (Stroke Patients Only) Modified Rankin (Stroke Patients Only) Pre-Morbid Rankin Score: Moderately severe disability Modified Rankin: Moderately severe disability     Balance Overall balance assessment: Needs assistance   Sitting balance-Leahy Scale: Good    Standing balance-Leahy Scale: Fair Standing balance comment: can release walker in static standing without LOB       Pertinent Vitals/Pain Pain Assessment Pain Assessment: Faces Faces Pain Scale: Hurts little more Pain Location: R hand Pain Descriptors / Indicators: Grimacing, Guarding Pain Intervention(s): Limited activity within patient's tolerance, Monitored during session, Repositioned    Home Living Family/patient expects to be discharged to:: Private residence Living Arrangements: Children Available Help at Discharge: Family;Personal care attendant;Available PRN/intermittently (aide 3 hours a day, 5 days a week, daughter leaves for only short periods of time) Type of Home: House Home Access: Level entry       Home Layout: Two level;Able to live on main level with bedroom/bathroom Home Equipment: Rollator (4 wheels);Cane - quad;Cane - single point;Shower seat;Other (comment) (B wrist splints for night wear)      Prior Function Prior Level of Function : Needs assist    Mobility Comments: walks with a rollator ADLs Comments: assisted for bathing, dressing med management and all IADLs     Extremity/Trunk Assessment   Upper Extremity Assessment Upper  Extremity Assessment: Defer to OT evaluation    Lower Extremity Assessment Lower  Extremity Assessment: Overall WFL for tasks assessed    Cervical / Trunk Assessment Cervical / Trunk Assessment: Normal  Communication   Communication Communication: Impaired Factors Affecting Communication: Hearing impaired    Cognition Arousal: Alert Behavior During Therapy: WFL for tasks assessed/performed   PT - Cognitive impairments: No apparent impairments    Following commands: Intact       Cueing Cueing Techniques: Verbal cues, Gestural cues      PT Assessment Patient does not need any further PT services         PT Goals (Current goals can be found in the Care Plan section)  Acute Rehab PT Goals PT Goal Formulation: All assessment and education complete, DC therapy     AM-PAC PT 6 Clicks Mobility  Outcome Measure Help needed turning from your back to your side while in a flat bed without using bedrails?: None Help needed moving from lying on your back to sitting on the side of a flat bed without using bedrails?: None Help needed moving to and from a bed to a chair (including a wheelchair)?: A Little Help needed standing up from a chair using your arms (e.g., wheelchair or bedside chair)?: A Little Help needed to walk in hospital room?: A Little Help needed climbing 3-5 steps with a railing? : A Little 6 Click Score: 20    End of Session Equipment Utilized During Treatment: Gait belt Activity Tolerance: Patient tolerated treatment well Patient left: Other (comment) (in bathroom with instructions to pull call bell) Nurse Communication: Mobility status PT Visit Diagnosis: Other abnormalities of gait and mobility (R26.89)    Time: 9094-9082 PT Time Calculation (min) (ACUTE ONLY): 12 min   Charges:   PT Evaluation $PT Eval Low Complexity: 1 Low   PT General Charges $$ ACUTE PT VISIT: 1 Visit       Kate ORN, PT, DPT Secure Chat Preferred  Rehab Office [phone]   Kate BRAVO [name] 12/06/2023, 11:41 AM

## 2023-12-06 NOTE — Plan of Care (Signed)
  Problem: Education: Goal: Knowledge of disease or condition will improve Outcome: Progressing Goal: Knowledge of secondary prevention will improve (MUST DOCUMENT ALL) 12/06/2023 1754 by Ernest Lapine, RN Outcome: Progressing 12/06/2023 1754 by Ernest Lapine, RN Outcome: Progressing Goal: Knowledge of patient specific risk factors will improve (DELETE if not current risk factor) Outcome: Progressing   Problem: Coping: Goal: Will verbalize positive feelings about self Outcome: Progressing   Problem: Clinical Measurements: Goal: Ability to maintain clinical measurements within normal limits will improve Outcome: Progressing Goal: Respiratory complications will improve Outcome: Progressing

## 2023-12-06 NOTE — Progress Notes (Signed)
 Patient ID: [name], female   DOB: 09/13/1933, 89 y.o.   MRN: [id number] [name] in to patient room to give Tylenol  per patient request and daughter states concern that patient's speech is getting worse. There is no slurring of words but as patient becomes frustrated she repeats words in sentences multiple times. This seems consistent with Dr. Benjiman note from this morning. Message sent to attending to evaluate,  Verdie JONETTA Collier, RN

## 2023-12-06 NOTE — Progress Notes (Addendum)
 STROKE TEAM PROGRESS NOTE   SUBJECTIVE (INTERVAL HISTORY) No family is at the bedside.  Overall her condition is gradually improving.  She still has occasional word finding difficulty and difficulty repeating sentences.  But otherwise neuro intact.  Admitted missing Xarelto  3 days and 2 days in 2 occasions in the last 2 weeks.  Now Xarelto  has resumed.   OBJECTIVE Temp:  [97.4 F (36.3 C)-99.1 F (37.3 C)] 97.5 F (36.4 C) (10/30 0950) Pulse Rate:  [82-100] 91 (10/30 0950) Cardiac Rhythm: Atrial fibrillation (10/29 1354) Resp:  [16-20] 17 (10/30 0950) BP: (97-129)/(62-88) 102/70 (10/30 0950) SpO2:  [92 %-100 %] 100 % (10/30 0950) Weight:  [60.8 kg] 60.8 kg (10/29 1352)  Recent Labs  Lab 12/05/23 1354  GLUCAP 109*   Recent Labs  Lab 12/05/23 1357 12/06/23 0157  NA 137 134*  K 4.0 3.7  CL 100 96*  CO2 25 23  GLUCOSE 106* 87  BUN 11 9  CREATININE 0.88 0.77  CALCIUM  9.6 8.8*   Recent Labs  Lab 12/05/23 1357 12/06/23 0157  AST 17 16  ALT <5 7  ALKPHOS 58 46  BILITOT 0.4 0.7  PROT 8.0 5.6*  ALBUMIN 4.0 3.1*   Recent Labs  Lab 12/05/23 1357 12/06/23 0157  WBC 7.3 6.4  NEUTROABS 5.3  --   HGB 10.6* 10.3*  HCT 33.3* 32.3*  MCV 88.8 88.0  PLT 407* 393   No results for input(s): CKTOTAL, CKMB, CKMBINDEX, TROPONINI in the last 168 hours. Recent Labs    12/05/23 1357  LABPROT 23.1*  INR 1.9*   No results for input(s): COLORURINE, LABSPEC, PHURINE, GLUCOSEU, HGBUR, BILIRUBINUR, KETONESUR, PROTEINUR, UROBILINOGEN, NITRITE, LEUKOCYTESUR in the last 72 hours.  Invalid input(s): APPERANCEUR     Component Value Date/Time   CHOL 137 12/06/2023 0157   TRIG 59 12/06/2023 0157   HDL 48 12/06/2023 0157   CHOLHDL 2.9 12/06/2023 0157   VLDL 12 12/06/2023 0157   LDLCALC 77 12/06/2023 0157   Lab Results  Component Value Date   HGBA1C 5.8 (H) 12/06/2023   No results found for: LABOPIA, COCAINSCRNUR, LABBENZ, AMPHETMU, THCU,  LABBARB  Recent Labs  Lab 12/05/23 1357  ETH <15    I have personally reviewed the radiological images below and agree with the radiology interpretations.  CT ANGIO HEAD NECK W WO CM Result Date: 12/06/2023 EXAM: CTA HEAD AND NECK WITH AND WITHOUT 12/06/2023 01:11:35 AM TECHNIQUE: CTA of the head and neck was performed with and without the administration of 75 mL of intravenous iohexol  (OMNIPAQUE ) 350 MG/ML injection. Multiplanar 2D and/or 3D reformatted images are provided for review. Automated exposure control, iterative reconstruction, and/or weight based adjustment of the mA/kV was utilized to reduce the radiation dose to as low as reasonably achievable. Stenosis of the internal carotid arteries measured using NASCET criteria. COMPARISON: Prior studies from 12/05/2023. CLINICAL HISTORY: Neuro deficit, acute, stroke suspected. 75ml omni 350; Aphasia, neuro deficit, acute, stroke suspected. FINDINGS: CTA NECK: AORTIC ARCH AND ARCH VESSELS: Mild aortic atherosclerosis. No dissection or arterial injury. No significant stenosis of the brachiocephalic or subclavian arteries. CERVICAL CAROTID ARTERIES: Right common and internal carotid arteries are tortuous with mild atherosclerotic change about the right carotid bulb without significant stenosis. Left common and internal carotid arteries are tortuous with calcified plaque about the left carotid bulb without hemodynamically significant greater than 50% stenosis. No dissection or arterial injury. CERVICAL VERTEBRAL ARTERIES: Left vertebral artery is dominant. Atheromatous plaque at the origin of the right vertebral artery with  severe ostial stenosis. No dissection or arterial injury. LUNGS AND MEDIASTINUM: Sequelae of prior axillary nodal dissection on the left. SOFT TISSUES: Patient is a denture list. BONES: Advanced multilevel cervical spondylosis, most pronounced at C4-C5. CTA HEAD: ANTERIOR CIRCULATION: Mild atherosclerotic change about the carotid  siphons without hemodynamically significant stenosis. No significant stenosis of the anterior cerebral arteries. No significant stenosis of the middle cerebral arteries. No aneurysm. POSTERIOR CIRCULATION: No significant stenosis of the posterior cerebral arteries. No significant stenosis of the basilar artery. No significant stenosis of the vertebral arteries. No aneurysm. OTHER: No dural venous sinus thrombosis on this non-dedicated study. IMPRESSION: 1. Negative CTA for large vessel occlusion or other emergent finding. 2. Severe ostial stenosis at the origin of the right vertebral artery. 3. Mild atherosclerosis about the carotid bulbs and carotid siphons without hemodynamically significant stenosis. 4. Aortic atherosclerosis. Electronically signed by: Morene Hoard MD 12/06/2023 01:56 AM EDT RP Workstation: [id number]   MR BRAIN WO CONTRAST Result Date: 12/05/2023 EXAM: MRI BRAIN WITHOUT CONTRAST 12/05/2023 08:50:25 PM TECHNIQUE: Multiplanar multisequence MRI of the head/brain was performed without the administration of intravenous contrast. COMPARISON: CT from earlier the same day. CLINICAL HISTORY: Neuro deficit, acute, stroke suspected. Since Sunday, patient has had difficulty with memory recall. She also began having difficulty speaking yesterday. Patient combative and agitated, all images sent. FINDINGS: BRAIN AND VENTRICLES: Generalized age-related atrophy with mild chronic microvascular ischemic disease. Patchy small volume restricted diffusion seen involving the left insular cortex and overlying left frontal lobe (series 5, images 54, 57) consistent with a small acute ischemic left MCA distribution infarct. No associated hemorrhage or mass effect. Single chronic microhemorrhage noted within the left temporal occipital region, of doubtful significance in isolation. No mass. No midline shift. No hydrocephalus. The sellar/suprasellar regions appear unremarkable. Normal flow voids. ORBITS: Prior  bilateral ocular lens replacement. SINUSES AND MASTOIDS: No acute abnormality. BONES AND SOFT TISSUES: Normal marrow signal. No acute soft tissue abnormality. Advanced spondylosis within the visualized upper cervical spine. IMPRESSION: 1. Small acute ischemic infarcts involving the left insula and overlying left frontal lobe. No associated hemorrhage or mass effect. 2. Underlying atrophy with mild chronic microvascular ischemic disease. Electronically signed by: Benjamin Mcclintock MD 12/05/2023 10:12 PM EDT RP Workstation: [id number]   CT HEAD WO CONTRAST Result Date: 12/05/2023 CLINICAL DATA:  Speech problems beginning yesterday. EXAM: CT HEAD WITHOUT CONTRAST TECHNIQUE: Contiguous axial images were obtained from the base of the skull through the vertex without intravenous contrast. RADIATION DOSE REDUCTION: This exam was performed according to the departmental dose-optimization program which includes automated exposure control, adjustment of the mA and/or kV according to patient size and/or use of iterative reconstruction technique. COMPARISON:  12/27/2022 FINDINGS: Brain: Ventricles, cisterns and other CSF spaces are normal. No evidence of mass, mass effect, shift of midline structures or acute hemorrhage. Mild chronic ischemic microvascular disease is present. Bilateral basal ganglia calcifications. Vascular: No hyperdense vessel or unexpected calcification. Skull: Normal. Negative for fracture or focal lesion. Sinuses/Orbits: No acute finding. Other: None. IMPRESSION: 1. No acute findings. 2. Mild chronic ischemic microvascular disease. Electronically Signed   By: Daniel  Boyle M.D.   On: 12/05/2023 15:08   NCV with EMG (electromyography) Result Date: 11/23/2023 Newton, Frederic, MD     10 /22/2025  9:13 AM EMG & NCV Findings: Evaluation of the left median motor and the right median motor nerves showed prolonged distal onset latency (L4.8, R4.8 ms), reduced amplitude (L2.5, R2.2 mV), and decreased  conduction velocity (Elbow-Wrist, L41, R42 m/s).  The left ulnar motor nerve showed decreased conduction velocity (A Elbow-B Elbow, 50 m/s).  The left median (across palm) sensory nerve showed no response (Palm), prolonged distal peak latency (4.7 ms), and reduced amplitude (8.6 V).  The right median (across palm) sensory nerve showed no response (Wrist) and no response (Palm).  The left ulnar sensory nerve showed prolonged distal peak latency (3.8 ms) and decreased conduction velocity (Wrist-5th Digit, 37 m/s).  The right ulnar sensory nerve showed prolonged distal peak latency (6.0 ms), reduced amplitude (8.6 V), and decreased conduction velocity (Wrist-5th Digit, 23 m/s).  All remaining nerves (as indicated in the following tables) were within normal limits.  Left vs. Right side comparison data for the ulnar motor nerve indicates abnormal L-R amplitude difference (26.8 %).  The ulnar sensory nerve indicates abnormal L-R latency difference (2.2 ms).  All remaining left vs. right side differences were within normal limits.  Needle evaluation of the right abductor pollicis brevis muscle showed increased insertional activity and moderately increased spontaneous activity.  All remaining muscles (as indicated in the following table) showed no evidence of electrical instability.  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  Active motor units on needle EMG portend a better outcome for recovery.  **As you know, this particular electrodiagnostic study cannot rule out chemical radiculitis or sensory only radiculopathy. There is no significant electrodiagnostic evidence of any other focal nerve entrapment, brachial plexopathy or cervical radiculopathy. Recommendations: 1.  Follow-up with referring physician. 2.  Continue current management of symptoms. 3.  Continue  use of resting splint at night-time and as needed during the day. 4.  Suggest surgical evaluation. ___________________________ Prentice Masters FAAPMR Board Certified, American Board of Physical Medicine and Rehabilitation Nerve Conduction Studies Anti Sensory Summary Table  Stim Site NR Peak (ms) Norm Peak (ms) P-T Amp (V) Norm P-T Amp Site1 Site2 Delta-P (ms) Dist (cm) Vel (m/s) Norm Vel (m/s) Left Median Acr Palm Anti Sensory (2nd Digit)  31.9C Wrist    *4.7 <3.6 *8.6 >10 Wrist Palm  0.0   Palm *NR  <2.0         Right Median Acr Palm Anti Sensory (2nd Digit)  31.6C Wrist *NR  <3.6  >10 Wrist Palm  0.0   Palm *NR  <2.0         Left Radial Anti Sensory (Base 1st Digit)  32C Wrist    2.5 <3.1 16.4  Wrist Base 1st Digit 2.5 0.0   Right Radial Anti Sensory (Base 1st Digit)  31C Wrist    2.5 <3.1 6.4  Wrist Base 1st Digit 2.5 0.0   Left Ulnar Anti Sensory (5th Digit)  32.4C Wrist    *3.8 <3.7 16.6 >15.0 Wrist 5th Digit 3.8 14.0 *37 >38 Right Ulnar Anti Sensory (5th Digit)  31.6C Wrist    *6.0 <3.7 *8.6 >15.0 Wrist 5th Digit 6.0 14.0 *23 >38 B Elbow    14.2  4.4  B Elbow Wrist 8.2 0.0  >47 Motor Summary Table  Stim Site NR Onset (ms) Norm Onset (ms) O-P Amp (mV) Norm O-P Amp Site1 Site2 Delta-0 (ms) Dist (cm) Vel (m/s) Norm Vel (m/s) Left Median Motor (Abd Poll Brev)  32.2C Wrist    *4.8 <4.2 *2.5 >5 Elbow Wrist 5.3 21.5 *41 >50 Elbow    10.1  1.9        Right Median Motor (Abd [employer]  Brev)  30.8C Wrist    *4.8 <4.2 *2.2 >5 Elbow Wrist 5.1 21.5 *42 >50 Elbow    9.9  2.0        Left Ulnar Motor (Abd Dig Min)  32.2C Wrist    3.3 <4.2 5.6 >3 B Elbow Wrist 3.8 20.0 53 >53 B Elbow    7.1  4.4  A Elbow B Elbow 2.0 10.0 *50 >53 A Elbow    9.1  2.8        Right Ulnar Motor (Abd Dig Min)  31.4C Wrist    3.7 <4.2 4.1 >3 B Elbow Wrist 4.0 21.0 53 >53 B Elbow    7.7  3.5  A Elbow B Elbow 1.8 10.0 56 >53 A Elbow    9.5  2.7        EMG  Side Muscle Nerve Root Ins Act Fibs Psw Amp Dur Poly Recrt Int Bruna Comment Right Abd Poll Brev  Median C8-T1 *Incr *2+ *2+ Nml Nml 0 Nml Nml  Right 1stDorInt Ulnar C8-T1 Nml Nml Nml Nml Nml 0 Nml Nml  Right PronatorTeres Median C6-7 Nml Nml Nml Nml Nml 0 Nml Nml  Right Biceps Musculocut C5-6 Nml Nml Nml Nml Nml 0 Nml Nml  Right Deltoid Axillary C5-6 Nml Nml Nml Nml Nml 0 Nml Nml  Nerve Conduction Studies Anti Sensory Left/Right Comparison  Stim Site L Lat (ms) R Lat (ms) L-R Lat (ms) L Amp (V) R Amp (V) L-R Amp (%) Site1 Site2 L Vel (m/s) R Vel (m/s) L-R Vel (m/s) Median Acr Palm Anti Sensory (2nd Digit)  31.9C Wrist *4.7   *8.6   Wrist Palm    Palm            Radial Anti Sensory (Base 1st Digit)  32C Wrist 2.5 2.5 0.0 16.4 6.4 61.0 Wrist Base 1st Digit    Ulnar Anti Sensory (5th Digit)  32.4C Wrist *3.8 *6.0 *2.2 16.6 *8.6 48.2 Wrist 5th Digit *37 *23 14 Motor Left/Right Comparison  Stim Site L Lat (ms) R Lat (ms) L-R Lat (ms) L Amp (mV) R Amp (mV) L-R Amp (%) Site1 Site2 L Vel (m/s) R Vel (m/s) L-R Vel (m/s) Median Motor (Abd Poll Brev)  32.2C Wrist *4.8 *4.8 0.0 *2.5 *2.2 12.0 Elbow Wrist *41 *42 1 Elbow 10.1 9.9 0.2 1.9 2.0 5.0      Ulnar Motor (Abd Dig Min)  32.2C Wrist 3.3 3.7 0.4 5.6 4.1 *26.8 B Elbow Wrist 53 53 0 B Elbow 7.1 7.7 0.6 4.4 3.5 20.5 A Elbow B Elbow *50 56 6 A Elbow 9.1 9.5 0.4 2.8 2.7 3.6      Waveforms:               PHYSICAL EXAM  Temp:  [97.4 F (36.3 C)-99.1 F (37.3 C)] 97.5 F (36.4 C) (10/30 0950) Pulse Rate:  [82-100] 91 (10/30 0950) Resp:  [16-20] 17 (10/30 0950) BP: (97-129)/(62-88) 102/70 (10/30 0950) SpO2:  [92 %-100 %] 100 % (10/30 0950) Weight:  [60.8 kg] 60.8 kg (10/29 1352)  General - Well nourished, well developed, in no apparent distress.  Ophthalmologic - fundi not visualized due to noncooperation.  Cardiovascular - irregularly irregular heart rate and rhythm.  Neuro - awake, alert, eyes open, orientated to age, place, time. Occasional word finding difficulty and difficulty repeating sentences. Able to name and following all simple commands.  No gaze palsy, tracking bilaterally, visual field full, PERRL. No facial droop. Tongue midline. Bilateral UEs 5/5, no drift. Bilaterally  LEs 5/5, no drift. Sensation symmetrical bilaterally, b/l FTN intact, gait not tested.     ASSESSMENT/PLAN [name] is a 89 y.o. female with history of A-fib on Xarelto , CHF with tricuspid regurgitation, breast cancer status post mastectomy and chemotherapy, non-STEMI admitted for speech difficulty for 2 days. No TNK given due to outside window.    Stroke:  left insular cortex and frontal lobe small infarcts embolic likely secondary to A-fib missed several doses of Xarelto  CT no acute abnormality CT head and neck severe right VA origin stenosis MRI left insular cortex and left frontal lobe 2-3 small infarcts 2D Echo EF 40-45% LDL 77 HgbA1c 5.8 Xarelto  for VTE prophylaxis Xarelto  (rivaroxaban ) daily but missed doses prior to admission, now on Xarelto  (rivaroxaban ) daily.  Continue on discharge Patient counseled to be compliant with her antithrombotic medications Ongoing aggressive stroke risk factor management Therapy recommendations: Home health PT Disposition: Pending  Persistent A-fib On metoprolol  PTA Rate controlled Missed Xarelto  3 doses for back injection and then 2 doses last 2 weeks Now back on Xarelto  Medication compliance education provided  Hypertension Stable Long term BP goal normotensive  Hyperlipidemia Home meds: Lipitor 40 LDL 77, goal < 70 Now on Lipitor 40 Continue statin at discharge  Other Stroke Risk Factors Advanced age CHF with EF 40-45%  Other Active Problems Breast cancer s/p surgery and chemotherapy  Hospital day # 0  Neurology will sign off. Please call with questions. Pt will follow up with stroke clinic NP at [facility] in about 4 weeks. Thanks for the consult.  Ary Cummins, MD PhD Stroke Neurology 12/06/2023 11:58 AM  I discussed with Dr. Carlota. I spent additional 30 minutes in patient  face-to-face time with the patient and family, reviewing test results, images and medication, and discussing the diagnosis, treatment plan and potential prognosis. This patient's care requiresreview of multiple databases, neurological assessment, discussion with family, other specialists and medical decision making of high complexity.      To contact Stroke Continuity provider, please refer to [email or web address]. After hours, contact General Neurology

## 2023-12-06 NOTE — ED Notes (Signed)
 Pt placed on hospital bed for comfort.

## 2023-12-06 NOTE — Progress Notes (Signed)
 Patient did not get to eat her full unch tray in the ED so we have ordered another tray for her.  Verdie JONETTA Collier, RN

## 2023-12-06 NOTE — Progress Notes (Signed)
 Progress Note   Patient: [name] FMW:[id number] DOB: 1933-06-01 DOA: 12/05/2023     0 DOS: the patient was seen and examined on 12/06/2023   Brief hospital course:89 y.o. female with history of persistent A-fib, chronic HFrEF with severe tricuspid regurgitation, anemia, prior history of breast cancer status post chemo and mastectomy, non-ST-elevation MI was brought to the ER after patient's daughter noticed that patient has been having slurring of speech over the last 24 hours. Patient states she had missed her Xarelto  last 2 days. MRI showed Small acute ischemic infarcts involving the left insula and overlying left frontal lobe. No associated hemorrhage or mass effect. Underlying atrophy with mild chronic microvascular ischemic disease.   Assessment and Plan: Acute CVA - Images reviewed, CTA negative for large vessel occlusion or other emergent finding.    discussed with neurologist. Continue Xarelto  and high intensity statin. Follow up 2D echo 2.  A-fib persistent on metoprolol  for rate control and Xarelto .  Patient had missed 2 dose of Xarelto  last 2 days. Hypertension will hold losartan given the acute CVA.  Will continue metoprolol . History of chronic HFrEF with reduced RVEF and dilated RV with severe tricuspid regurgitation being followed by cardiology.  Follow-up 2D echo. Chronic anemia follow CBC. Prior history of breast cancer s/p mastectomy and chemo. Diabetes mellitus type 2 A1c of 5.8        Subjective: Seen at bedside, she has no acute complaints. She is alert and oriented x 3  Physical Exam: Vitals:   12/05/23 1646 12/06/23 0402 12/06/23 0548 12/06/23 0950  BP: 114/62  114/88 102/70  Pulse: 82  85 91  Resp:   20 17  Temp:  (!) 97.4 F (36.3 C) (!) 97.5 F (36.4 C) (!) 97.5 F (36.4 C)  TempSrc:  Oral  Oral  SpO2: 92%  100% 100%  Weight:      Height:      Eyes: Anicteric no pallor. ENMT: No discharge from the ears eyes nose or mouth. Neck: No mass  felt.  No neck rigidity. Respiratory: No rhonchi or crepitations. Cardiovascular: S1-S2 heard. Abdomen: Soft nontender bowel sound present. Musculoskeletal: No edema. Skin: No rash. Neurologic: Alert awake oriented time place and person.  Moves all extremities 5 x 5.  No facial asymmetry tongue is midline pupils are equal and reacting to light. Psychiatric: Appears normal.  Normal affect.  Data Reviewed:    CBC    Component Value Date/Time   WBC 6.4 12/06/2023 0157   RBC 3.67 (L) 12/06/2023 0157   HGB 10.3 (L) 12/06/2023 0157   HCT 32.3 (L) 12/06/2023 0157   PLT 393 12/06/2023 0157   MCV 88.0 12/06/2023 0157   MCH 28.1 12/06/2023 0157   MCHC 31.9 12/06/2023 0157   RDW 14.6 12/06/2023 0157   LYMPHSABS 1.2 12/05/2023 1357   MONOABS 0.7 12/05/2023 1357   EOSABS 0.1 12/05/2023 1357   BASOSABS 0.0 12/05/2023 1357    CMP     Component Value Date/Time   NA 134 (L) 12/06/2023 0157   NA 138 01/15/2023 1412   K 3.7 12/06/2023 0157   CL 96 (L) 12/06/2023 0157   CO2 23 12/06/2023 0157   GLUCOSE 87 12/06/2023 0157   BUN 9 12/06/2023 0157   BUN 20 01/15/2023 1412   CREATININE 0.77 12/06/2023 0157   CREATININE 0.89 (H) 09/07/2016 1026   CALCIUM  8.8 (L) 12/06/2023 0157   PROT 5.6 (L) 12/06/2023 0157   PROT 8.4 12/22/2020 1549   ALBUMIN 3.1 (L)  12/06/2023 0157   ALBUMIN 5.1 (H) 12/22/2020 1549   AST 16 12/06/2023 0157   ALT 7 12/06/2023 0157   ALKPHOS 46 12/06/2023 0157   BILITOT 0.7 12/06/2023 0157   BILITOT 0.3 12/22/2020 1549   EGFR 39 (L) 01/15/2023 1412   GFRNONAA >60 12/06/2023 0157    Family Communication: None at bedside  Disposition: Status is: Observation The patient remains OBS appropriate and will d/c before 2 midnights.  Planned Discharge Destination: Home with Home Health    Time spent: 35  minutes  Author: Landon FORBES Baller, MD 12/06/2023 12:37 PM  For on call review [email or web address].

## 2023-12-06 NOTE — ED Notes (Signed)
Pt ambulated to restroom with nurse.

## 2023-12-06 NOTE — Consult Note (Addendum)
 NEUROLOGY CONSULT NOTE   Date of service: December 06, 2023 Patient Name: [name] MRN:  [id number] DOB:  1933-07-08 Chief Complaint: Speech difficulty Requesting Provider: Franky Redia SAILOR, MD  History of Present Illness  [name] is a 89 y.o. female with hx of atrial fibrillation on anticoagulation who presents with speech difficulty that has been going on since Tuesday.  She has missed a couple of doses of Xarelto  recently, and was off of Eliquis for several days for a procedure.  LKW: Tuesday IV Thrombolysis: No, outside window EVT: No, outside window  NIHSS components Score: Comment  1a Level of Conscious 0[]  1[]  2[]  3[]      1b LOC Questions 0[]  1[]  2[]       1c LOC Commands 0[]  1[]  2[]       2 Best Gaze 0[]  1[]  2[]       3 Visual 0[]  1[]  2[]  3[]      4 Facial Palsy 0[]  1[]  2[]  3[]      5a Motor Arm - left 0[]  1[]  2[]  3[]  4[]  UN[]    5b Motor Arm - Right 0[]  1[]  2[]  3[]  4[]  UN[]    6a Motor Leg - Left 0[]  1[]  2[]  3[]  4[]  UN[]    6b Motor Leg - Right 0[]  1[]  2[]  3[]  4[]  UN[]    7 Limb Ataxia 0[]  1[]  2[]  UN[]      8 Sensory 0[]  1[x]  2[]  UN[]      9 Best Language 0[]  1[x]  2[]  3[]      10 Dysarthria 0[]  1[]  2[]  UN[]      11 Extinct. and Inattention 0[]  1[]  2[]       TOTAL: 2      Past History   Past Medical History:  Diagnosis Date   Arthritis    all over   Atrial fibrillation (HCC)    Diagnosed ~2009   Breast cancer, left breast (HCC) 02/07/1996   S/P chemo and mastectomy    CHF (congestive heart failure) (HCC)    GERD (gastroesophageal reflux disease)    Hyperlipidemia    Hypertension    Left shoulder pain    MRI showed pinched nerve - per pt   MI (myocardial infarction) (HCC)    OSA on CPAP    Pneumonia    4-5 times (01/06/2015)   Type II diabetes mellitus (HCC)     Past Surgical History:  Procedure Laterality Date   BIOPSY  07/23/2022   Procedure: BIOPSY;  Surgeon: Kriss Estefana DEL, DO;  Location: [facility];  Service:  Gastroenterology;;   BREAST BIOPSY Left 02/07/1996   CATARACT EXTRACTION W/ INTRAOCULAR LENS  IMPLANT, BILATERAL Bilateral    ESOPHAGOGASTRODUODENOSCOPY N/A 07/23/2022   Procedure: ESOPHAGOGASTRODUODENOSCOPY (EGD);  Surgeon: Kriss Estefana DEL, DO;  Location: [facility];  Service: Gastroenterology;  Laterality: N/A;   JOINT REPLACEMENT     LAPAROSCOPIC CHOLECYSTECTOMY     LEFT HEART CATH AND CORONARY ANGIOGRAPHY N/A 07/07/2020   Procedure: LEFT HEART CATH AND CORONARY ANGIOGRAPHY;  Surgeon: Dann Candyce RAMAN, MD;  [facility]VASIVE CV LAB;  Service: Cardiovascular;  Laterality: N/A;   MASTECTOMY Left 02/07/1996   TOTAL KNEE ARTHROPLASTY Left 02/07/1999   TOTAL KNEE REVISION Left 01/28/2013   Procedure: LEFT TOTAL KNEE ARTHROPLASTY REVISION;  Surgeon: Kay Ozell Cummins, MD;  Location: [facility];  Service: Orthopedics;  Laterality: Left;    Family History: Family History  Problem Relation Age of Onset   Heart attack Mother        43s   Emphysema Brother    Allergic rhinitis Neg  Hx    Angioedema Neg Hx    Asthma Neg Hx    Eczema Neg Hx    Immunodeficiency Neg Hx    Urticaria Neg Hx     Social History  reports that she has never smoked. Her smokeless tobacco use includes snuff. She reports that she does not currently use alcohol. She reports that she does not use drugs.  Allergies  Allergen Reactions   Ferumoxytol  Itching and Other (See Comments)    Relieved with IV Benadryl       Chocolate Other (See Comments) and Cough    Sweating, too   Cocoa Other (See Comments) and Cough    Sweating, also   Lisinopril  Other (See Comments) and Cough    Sweating, too   Peanut Oil Cough    Medications   Current Facility-Administered Medications:    [START ON 12/07/2023]  stroke: early stages of recovery book, , Does not apply, Once, Franky Redia SAILOR, MD   0.9 %  sodium chloride  infusion, , Intravenous, Continuous, Franky Redia SAILOR, MD, Last Rate: 40 mL/hr at 12/06/23  0148, New Bag at 12/06/23 0148   acetaminophen  (TYLENOL ) tablet 650 mg, 650 mg, Oral, Q4H PRN **OR** acetaminophen  (TYLENOL ) 160 MG/5ML solution 650 mg, 650 mg, Per Tube, Q4H PRN **OR** acetaminophen  (TYLENOL ) suppository 650 mg, 650 mg, Rectal, Q4H PRN, Franky Redia SAILOR, MD   metoprolol  tartrate (LOPRESSOR ) tablet 25 mg, 25 mg, Oral, BID, Kakrakandy, Arshad N, MD   Rivaroxaban  (XARELTO ) tablet 15 mg, 15 mg, Oral, Q supper, Franky Redia SAILOR, MD  Current Outpatient Medications:    dorzolamide (TRUSOPT) 2 % ophthalmic solution, Place 1 drop into both eyes 2 (two) times daily., Disp: , Rfl:    DULoxetine (CYMBALTA) 30 MG capsule, Take 30 mg by mouth daily., Disp: , Rfl:    gabapentin  (NEURONTIN ) 600 MG tablet, Take 600 mg by mouth 2 (two) times daily., Disp: , Rfl:    HYDROcodone -acetaminophen  (NORCO) 10-325 MG tablet, Take 1 tablet by mouth every 6 (six) hours as needed., Disp: , Rfl:    hydrOXYzine (VISTARIL) 25 MG capsule, Take 25 mg by mouth 3 (three) times daily., Disp: , Rfl:    meclizine (ANTIVERT) 25 MG tablet, Take 25 mg by mouth daily as needed., Disp: , Rfl:    omeprazole  (PRILOSEC) 20 MG capsule, Take 20 mg by mouth 2 (two) times daily., Disp: , Rfl:    albuterol  (VENTOLIN  HFA) 108 (90 Base) MCG/ACT inhaler, Inhale 2 puffs into the lungs every 4 (four) hours as needed for wheezing or shortness of breath., Disp: , Rfl:    alendronate (FOSAMAX) 70 MG tablet, Take 70 mg by mouth once a week., Disp: , Rfl:    atorvastatin  (LIPITOR) 40 MG tablet, TAKE 1 TABLET BY MOUTH EVERY DAY, Disp: 90 tablet, Rfl: 3   Cholecalciferol  (VITAMIN D ) 2000 units tablet, Take 2,000 Units by mouth daily., Disp: , Rfl:    conjugated estrogens  (PREMARIN ) vaginal cream, Place 1 Applicatorful vaginally daily., Disp: 42.5 g, Rfl: 12   diclofenac  Sodium (VOLTAREN ) 1 % GEL, Apply 4 g topically 4 (four) times daily. (Patient not taking: No sig reported), Disp: 100 g, Rfl: 0   FLOVENT  HFA 110 MCG/ACT inhaler, TAKE  2 PUFFS BY MOUTH TWICE A DAY, Disp: 12 g, Rfl: 0   fluticasone  (FLONASE ) 50 MCG/ACT nasal spray, Place 1 spray into both nostrils as needed., Disp: , Rfl:    furosemide  (LASIX ) 40 MG tablet, Take 1 tablet (40 mg total) by mouth daily.,  Disp: 90 tablet, Rfl: 3   gabapentin  (NEURONTIN ) 100 MG capsule, Take 100-200 mg by mouth at bedtime., Disp: , Rfl:    hydrocortisone  (ANUSOL -HC) 2.5 % rectal cream, Place 1 Application rectally 2 (two) times daily., Disp: 30 g, Rfl: 0   ketorolac  (ACULAR ) 0.5 % ophthalmic solution, Place into both eyes daily., Disp: , Rfl:    latanoprost (XALATAN) 0.005 % ophthalmic solution, SMARTSIG:1 Drop(s) In Eye(s) Every Evening, Disp: , Rfl:    lidocaine  (LIDODERM ) 5 %, Place 1 patch onto the skin daily. Remove & Discard patch within 12 hours or as directed by MD, Disp: 14 patch, Rfl: 0   liver oil-zinc  oxide (DESITIN) 40 % ointment, Apply topically as needed for irritation., Disp: 56.7 g, Rfl: 0   losartan (COZAAR) 25 MG tablet, Take 25 mg by mouth daily., Disp: , Rfl:    metoprolol  succinate (TOPROL -XL) 25 MG 24 hr tablet, Take 25 mg by mouth daily., Disp: , Rfl:    metoprolol  tartrate (LOPRESSOR ) 25 MG tablet, TAKE 1 TABLET BY MOUTH TWICE A DAY, Disp: 180 tablet, Rfl: 1   pantoprazole  (PROTONIX ) 40 MG tablet, Take 1 tablet (40 mg total) by mouth daily., Disp: 30 tablet, Rfl: 1   predniSONE  (STERAPRED UNI-PAK 21 TAB) 10 MG (21) TBPK tablet, Take by mouth daily. Take 6 tabs by mouth daily  for 2 days, then 5 tabs for 2 days, then 4 tabs for 2 days, then 3 tabs for 2 days, 2 tabs for 2 days, then 1 tab by mouth daily for 2 days, Disp: 42 tablet, Rfl: 0   Rivaroxaban  (XARELTO ) 15 MG TABS tablet, Take 1 tablet (15 mg total) by mouth daily with supper., Disp: 30 tablet, Rfl: 1   sucralfate  (CARAFATE ) 1 g tablet, Take 1 tablet (1 g total) by mouth 4 (four) times daily for 7 days. (Patient not taking: Reported on 08/02/2023), Disp: 28 tablet, Rfl: 0  Vitals   Vitals:   Dec 14, 2023  1634 12/14/23 1646 12/06/23 0402 12/06/23 0548  BP: 97/64 114/62  114/88  Pulse: 91 82  85  Resp: 16   20  Temp: 99.1 F (37.3 C)  (!) 97.4 F (36.3 C) (!) 97.5 F (36.4 C)  TempSrc:   Oral   SpO2: 99% 92%  100%  Weight:      Height:        Body mass index is 23 kg/m.   Physical Exam   Constitutional: Appears well-developed and well-nourished.   Neurologic Examination    Neuro: Mental Status: Patient is awake, alert, oriented to person, place, month, year, and situation. Patient is able to give a clear and coherent history. No signs of neglect.  She is able to name objects and repeat, but she does have some perseveration, and stumbles over her words at times. Cranial Nerves: II: Visual Fields are full. Pupils are equal, round, and reactive to light.   III,IV, VI: EOMI without ptosis or diploplia.  V: Facial sensation is diminished on the right VII: Facial movement is symmetric.  VIII: hearing is intact to voice X: Uvula elevates symmetrically XII: tongue is midline without atrophy or fasciculations.  Motor: Tone is normal. Bulk is normal. 5/5 strength was present in all four extremities.  Sensory: Sensation is diminished in the right arm Cerebellar: FNF intact bilaterally        Labs/Imaging/Neurodiagnostic studies   CBC:  Recent Labs  Lab 2023-12-14 1357 12/06/23 0157  WBC 7.3 6.4  NEUTROABS 5.3  --   HGB  10.6* 10.3*  HCT 33.3* 32.3*  MCV 88.8 88.0  PLT 407* 393   Basic Metabolic Panel:  Lab Results  Component Value Date   NA 134 (L) 12/06/2023   K 3.7 12/06/2023   CO2 23 12/06/2023   GLUCOSE 87 12/06/2023   BUN 9 12/06/2023   CREATININE 0.77 12/06/2023   CALCIUM  8.8 (L) 12/06/2023   GFRNONAA >60 12/06/2023   GFRAA >60 02/05/2019   Lipid Panel:  Lab Results  Component Value Date   LDLCALC 77 12/06/2023   HgbA1c:  Lab Results  Component Value Date   HGBA1C 5.8 (H) 12/06/2023   Urine Drug Screen: No results found for: LABOPIA,  COCAINSCRNUR, LABBENZ, AMPHETMU, THCU, LABBARB  Alcohol Level     Component Value Date/Time   [facility] <15 12/05/2023 1357   INR  Lab Results  Component Value Date   INR 1.9 (H) 12/05/2023   APTT  Lab Results  Component Value Date   APTT 59 (H) 12/05/2023    MRI Brain(Personally reviewed): Small left hemispheric strokes  ASSESSMENT   [name] is a 89 y.o. female with small left hemispheric strokes in the setting of atrial fibrillation and several missed doses of Xarelto . my suspicion is that this is the likely etiology, but she does have other risk factors and I would favor optimizing these as well.  She is being admitted for speech therapy evaluation as well as secondary risk factor modification.  The strokes are small enough, that I think would be safe to restart her anticoagulation today.  RECOMMENDATIONS  - HgbA1c, fasting lipid panel - Frequent neuro checks - Prophylactic therapy-continue home Xarelto  - Risk factor modification - Telemetry monitoring - PT consult, OT consult, Speech consult - Stroke team to follow ______________________________________________________________________    Signed, Aisha Seals, MD [facility]  I personally spent a total of 45 minutes in the care of the patient today including preparing to see the patient, performing a medically appropriate exam/evaluation, counseling and educating, placing orders, referring and communicating with other health care professionals, documenting clinical information in the EHR, and independently interpreting results.

## 2023-12-06 NOTE — Progress Notes (Signed)
 Echocardiogram 2D Echocardiogram has been performed.  Thea Norlander 12/06/2023, 11:08 AM

## 2023-12-07 DIAGNOSIS — I639 Cerebral infarction, unspecified: Secondary | ICD-10-CM | POA: Diagnosis not present

## 2023-12-07 DIAGNOSIS — I6389 Other cerebral infarction: Secondary | ICD-10-CM | POA: Diagnosis not present

## 2023-12-07 LAB — GLUCOSE, CAPILLARY: Glucose-Capillary: 93 mg/dL (ref 70–99)

## 2023-12-07 MED ORDER — HYDROCODONE-ACETAMINOPHEN 10-325 MG PO TABS
1.0000 | ORAL_TABLET | Freq: Four times a day (QID) | ORAL | Status: DC | PRN
Start: 2023-12-07 — End: 2023-12-07
  Administered 2023-12-07: 1 via ORAL
  Filled 2023-12-07: qty 1

## 2023-12-07 MED ORDER — METOPROLOL SUCCINATE ER 25 MG PO TB24: 12.5000 mg | ORAL_TABLET | Freq: Every day | ORAL | 0 refills | Status: DC

## 2023-12-07 NOTE — Evaluation (Signed)
 Speech Language Pathology Evaluation Patient Details Name: [name] MRN: [id number] DOB: 01/31/1934 Today's Date: 12/07/2023 Time: 8956-8895 SLP Time Calculation (min) (ACUTE ONLY): 21 min  Problem List:  Patient Active Problem List   Diagnosis Date Noted   Acute CVA (cerebrovascular accident) (HCC) 12/05/2023   CHF exacerbation (HCC) 09/12/2020   Hypomagnesemia 09/12/2020   Acute on chronic diastolic CHF (congestive heart failure) (HCC) 09/11/2020   OSA on CPAP    CKD (chronic kidney disease), stage III (HCC)    Anemia    Hypokalemia    Benign essential hypertension 07/15/2020   Malnutrition of moderate degree 07/09/2020   Chest pain of uncertain etiology    Unstable angina (HCC) 07/03/2020   Primary osteoarthritis, right ankle and foot 01/24/2018   Chronic idiopathic constipation 09/28/2017   Arthritis, multiple joint involvement 09/28/2017   Arthritis of right ankle 08/21/2017   Controlled type 2 diabetes mellitus with hyperglycemia, without long-term current use of insulin  (HCC) 08/17/2017   Allergic urticaria 09/07/2016   Generalized pruritus 09/07/2016   Moderate persistent asthma 09/07/2016   Perennial and seasonal allergic rhinitis 09/07/2016   History of breast cancer in female 01/12/2016   Chest pain 01/06/2015   Angina at rest 01/06/2015   Diabetes (HCC) 01/06/2015   Persistent atrial fibrillation (HCC) 01/06/2015   HLD (hyperlipidemia) 01/06/2015   Tobacco abuse 01/06/2015   Type II diabetes mellitus (HCC)    Painful total knee replacement 01/28/2013   Chronic obstructive asthma (HCC) 06/18/2012   Atrial fibrillation (HCC)    Left shoulder pain    Hypertension    Past Medical History:  Past Medical History:  Diagnosis Date   Arthritis    all over   Atrial fibrillation (HCC)    Diagnosed ~2009   Breast cancer, left breast (HCC) 02/07/1996   S/P chemo and mastectomy    CHF (congestive heart failure) (HCC)    GERD (gastroesophageal reflux  disease)    Hyperlipidemia    Hypertension    Left shoulder pain    MRI showed pinched nerve - per pt   MI (myocardial infarction) (HCC)    OSA on CPAP    Pneumonia    4-5 times (01/06/2015)   Type II diabetes mellitus (HCC)    Past Surgical History:  Past Surgical History:  Procedure Laterality Date   BIOPSY  07/23/2022   Procedure: BIOPSY;  Surgeon: Kriss Estefana DEL, DO;  Location: [facility];  Service: Gastroenterology;;   BREAST BIOPSY Left 02/07/1996   CATARACT EXTRACTION W/ INTRAOCULAR LENS  IMPLANT, BILATERAL Bilateral    ESOPHAGOGASTRODUODENOSCOPY N/A 07/23/2022   Procedure: ESOPHAGOGASTRODUODENOSCOPY (EGD);  Surgeon: Kriss Estefana DEL, DO;  Location: [facility];  Service: Gastroenterology;  Laterality: N/A;   JOINT REPLACEMENT     LAPAROSCOPIC CHOLECYSTECTOMY     LEFT HEART CATH AND CORONARY ANGIOGRAPHY N/A 07/07/2020   Procedure: LEFT HEART CATH AND CORONARY ANGIOGRAPHY;  Surgeon: Dann Candyce RAMAN, MD;  Location: [facility] INVASIVE CV LAB;  Service: Cardiovascular;  Laterality: N/A;   MASTECTOMY Left 02/07/1996   TOTAL KNEE ARTHROPLASTY Left 02/07/1999   TOTAL KNEE REVISION Left 01/28/2013   Procedure: LEFT TOTAL KNEE ARTHROPLASTY REVISION;  Surgeon: Kay Ozell Cummins, MD;  Location: [facility];  Service: Orthopedics;  Laterality: Left;   HPI:  [name] is an 89 year old woman who presented on 12/05/23 with slurred speech. MRI + for small acute ischemic infarcts involving L insula and L frontal lobe. PMH: afib, CHF, severe tricuspid regurgitation, anemia, breast ca, B carpal  tunnel.   Assessment / Plan / Recommendation Clinical Impression  Pt presents with grossly normal expressive and receptive communication abilities.  Pt was assessed using the Western Aphasia Battery- Bedside Form (see below for additional information).  There was one instance of word finding difficulty with pt stating finger but unable to provide specific name for index/pointer finger.   There was one instance of consonant distortion v difficulty with repetition with sentence repetition task.  Pt did require repetition of some prompts which was related to difficulty hearing therapist, no difficulty understanding instructions.  Pt's speech was clear and without dysarthria.  Pt and family report return to baseline.  At this time pt needs no ST follow up.   SLP will sign off.  Western Aphasia Battery - Bedside Form Content: 10 Fluency: 10 Yes/No: 10 Sequential Commands: 10 Repetition: 9 Naming: 9.5 Bedside Aphasia Score: 97.5/100       SLP Assessment  SLP Visit Diagnosis: Aphasia (R47.01)     Assistance Recommended at Discharge  None  Functional Status Assessment Patient has not had a recent decline in their functional status  Frequency and Duration   N/A        SLP Evaluation Cognition  Overall Cognitive Status: Within Functional Limits for tasks assessed Arousal/Alertness: Awake/alert Orientation Level: Oriented to place;Oriented to person;Oriented to situation       Comprehension  Auditory Comprehension Overall Auditory Comprehension: Appears within functional limits for tasks assessed Yes/No Questions: Within Functional Limits Commands: Within Functional Limits Conversation: Complex Visual Recognition/Discrimination Discrimination: Not tested Reading Comprehension Reading Status: Not tested    Expression Expression Primary Mode of Expression: Verbal Verbal Expression Overall Verbal Expression: Appears within functional limits for tasks assessed Initiation: No impairment Level of Generative/Spontaneous Verbalization: Conversation Repetition: No impairment Naming: No impairment Pragmatics: No impairment Written Expression Dominant Hand: Right Written Expression: Not tested   Oral / Motor  Motor Speech Overall Motor Speech: Appears within functional limits for tasks assessed Phonation: Normal Resonance: Within functional limits Articulation:  Within functional limitis Intelligibility: Intelligible Motor Planning: Within functional limits Motor Speech Errors: Not applicable            Anette FORBES Grippe, MA, CCC-SLP Acute Rehabilitation Services Office: [phone] 12/07/2023, 11:17 AM

## 2023-12-07 NOTE — TOC Transition Note (Signed)
 Transition of Care [facility]) - Discharge Note   Patient Details  Name: [name] MRN: [id number] Date of Birth: 1933-10-07  [facility] CM/SW Contact:  Tom-Johnson, Harvest Muskrat, RN Phone Number: 12/07/2023, 12:46 PM   Clinical Narrative:     Patient is scheduled for discharge today.  Readmission Risk Assessment done. Home health info, Outpatient referral, hospital f/u and discharge instructions on AVS. Prescriptions sent to [facility] pharmacy and patient will receive meds prior discharge. Daughter, Nanetta to transport at discharge.  No further ICM needs noted.      Final next level of care: Home w Home Health Services Barriers to Discharge: Barriers Resolved   Patient Goals and CMS Choice Patient states their goals for this hospitalization and ongoing recovery are:: To return home CMS Medicare.gov Compare Post Acute Care list provided to:: Patient Represenative (must comment) Choice offered to / list presented to : Patient, Adult Children (Daughter, Nanetta)      Discharge Placement                Patient to be transferred to facility by: Daughter Name of family member notified: [facility]    Discharge Plan and Services Additional resources added to the After Visit Summary for     Discharge Planning Services: CM Consult Post Acute Care Choice: Home Health                    HH Arranged: PT, Speech Therapy HH Agency: [facility] Date [facility] Agency Contacted: 12/06/23   Representative spoke with at [facility] Agency: amy  Social Drivers of Health (SDOH) Interventions SDOH Screenings   Food Insecurity: Patient Declined (12/06/2023)  Transportation Needs: No Transportation Needs (06/13/2022)   Received from [facility]  Utilities: Not At Risk (06/13/2022)   Received from [facility]  Financial Resource Strain: Low Risk  (06/13/2022)   Received from [facility]  Social Connections: Unknown (05/16/2022)   Received from [facility]  Tobacco Use:  High Risk (12/06/2023)     Readmission Risk Interventions     No data to display

## 2023-12-07 NOTE — Plan of Care (Signed)
 PT D/C to home with family the patient given early stroke education book. All IV and hospital equipment removed. Personal belongings returned. AVS reviewed with daughter and patient. All follow up questions answered, no other needs voiced at this time.  [name] 12/07/23 11:08 AM

## 2023-12-07 NOTE — Discharge Summary (Signed)
 Physician Discharge Summary   Patient: [name] MRN: [id number] DOB: 1933-07-15  Admit date:     12/05/2023  Discharge date: 12/07/23  Discharge Physician: Landon BRAVO Jailah Willis   PCP: Delores Rojelio Caldron, NP   Recommendations at discharge:      Discharge Diagnoses: Principal Problem:   Acute CVA (cerebrovascular accident) [facility]) Active Problems:   Atrial fibrillation (HCC)   Hypertension   Type II diabetes mellitus (HCC)   Anemia   History of breast cancer in female   Chronic obstructive asthma (HCC)  Resolved Problems:   * No resolved hospital problems. *  Hospital Course: [name] is a 89 y.o. female with history of persistent A-fib, chronic HFrEF with severe tricuspid regurgitation, anemia, prior history of breast cancer status post chemo and mastectomy, non-ST-elevation MI was brought to the ER after patient's daughter noticed that patient has been having slurring of speech over the last 24 hours.  Denies any weakness of the extremities. Patient has been recently being followed by orthopedics for bilateral hand pain concerning for carpal tunnel syndrome.   Patient states she had missed her Xarelto  last 2 days.  In the ER on exam patient is able to move all extremities without difficulty.  MRI of the brain shows small acute infarct involving the left insula.   No TNK given due to outside window.   Neurology on-call was consulted patient is being admitted for further workup.  EKG shows A-fib rate controlled.  Labs show hemoglobin of 10.6 creatinine 0.8.  Assessment and Plan: Acute CVA Initial CT no acute abnormality, CT head and neck severe right VA origin stenosis, MRI left insular cortex and left frontal lobe 2-3 small infarcts 2D Echo EF 40-45%, LDL 77, HgbA1c 5.8, Xarelto  for VTE prophylaxis Xarelto  (rivaroxaban ) daily but missed doses prior to admission. Patient was counseled to be compliant with her antithrombotic medications Patient to continue with  Xarelto . She had an episode of word repetition and dysarthria when she got to the floor, repeat CT head was obtained and showed no abnormality.  Sinus Pauses 2x 2 second pauses noted on tele Patient was symptomatic, Discussed with Dr Oneil who recommended changing her Metoprolol  succinate from 25mg  daily to 12.5mg  BID. He stated that since patient is in Afib it would be worrisome if she had more than 5s pauses. Patient and daughter advised to follow up with her primary Cardiology.  Persistent A-fib On metoprolol  12.5mg  BID, Rate controlled Patient reports missing her Missed Xarelto  3 doses for back injection and subsequently 2 doses last 2 weeks. Hypertension Stable, Long term BP goal normotensive   Hyperlipidemia LDL 77, Continue statin at discharge    Chronic Systolic CHF Euvolemic  EF 40-45% On beta blocker   Other Active Problems Breast cancer s/p surgery and chemotherapy        Consultants: Neurology Procedures performed:   Disposition: Home Diet recommendation:  Cardiac diet DISCHARGE MEDICATION: Allergies as of 12/07/2023       Reactions   Ferumoxytol  Itching, Other (See Comments)   Relieved with IV Benadryl      Chocolate Other (See Comments), Cough   Sweating, too   Cocoa Other (See Comments), Cough   Sweating, also   Lisinopril  Other (See Comments), Cough   Sweating, too   Peanut Oil Cough        Medication List     STOP taking these medications    metoprolol  tartrate 25 MG tablet Commonly known as: LOPRESSOR   TAKE these medications    albuterol  108 (90 Base) MCG/ACT inhaler Commonly known as: VENTOLIN  HFA Inhale 2 puffs into the lungs every 4 (four) hours as needed for wheezing or shortness of breath.   alendronate 70 MG tablet Commonly known as: FOSAMAX Take 70 mg by mouth once a week.   atorvastatin  40 MG tablet Commonly known as: LIPITOR TAKE 1 TABLET BY MOUTH EVERY DAY   diclofenac  Sodium 1 % Gel Commonly known as:  VOLTAREN  Apply 4 g topically 4 (four) times daily.   dorzolamide 2 % ophthalmic solution Commonly known as: TRUSOPT Place 1 drop into both eyes 2 (two) times daily.   DULoxetine 30 MG capsule Commonly known as: CYMBALTA Take 30 mg by mouth daily.   Flovent  HFA 110 MCG/ACT inhaler Generic drug: fluticasone  TAKE 2 PUFFS BY MOUTH TWICE A DAY   fluticasone  50 MCG/ACT nasal spray Commonly known as: FLONASE  Place 1 spray into both nostrils as needed.   furosemide  40 MG tablet Commonly known as: LASIX  Take 1 tablet (40 mg total) by mouth daily.   gabapentin  100 MG capsule Commonly known as: NEURONTIN  Take 100-200 mg by mouth at bedtime.   gabapentin  600 MG tablet Commonly known as: NEURONTIN  Take 600 mg by mouth 2 (two) times daily.   HYDROcodone -acetaminophen  10-325 MG tablet Commonly known as: NORCO Take 1 tablet by mouth every 6 (six) hours as needed.   hydrocortisone  2.5 % rectal cream Commonly known as: ANUSOL -HC Place 1 Application rectally 2 (two) times daily.   hydrOXYzine 25 MG capsule Commonly known as: VISTARIL Take 25 mg by mouth 3 (three) times daily.   ketorolac  0.5 % ophthalmic solution Commonly known as: ACULAR  Place into both eyes daily.   latanoprost 0.005 % ophthalmic solution Commonly known as: XALATAN SMARTSIG:1 Drop(s) In Eye(s) Every Evening   lidocaine  5 % Commonly known as: Lidoderm  Place 1 patch onto the skin daily. Remove & Discard patch within 12 hours or as directed by MD   liver oil-zinc  oxide 40 % ointment Commonly known as: DESITIN Apply topically as needed for irritation.   losartan 25 MG tablet Commonly known as: COZAAR Take 25 mg by mouth daily.   meclizine 25 MG tablet Commonly known as: ANTIVERT Take 25 mg by mouth daily as needed.   metoprolol  succinate 25 MG 24 hr tablet Commonly known as: TOPROL -XL Take 0.5 tablets (12.5 mg total) by mouth daily. What changed: how much to take   omeprazole  20 MG capsule Commonly  known as: PRILOSEC Take 20 mg by mouth 2 (two) times daily.   pantoprazole  40 MG tablet Commonly known as: Protonix  Take 1 tablet (40 mg total) by mouth daily.   predniSONE  10 MG (21) Tbpk tablet Commonly known as: STERAPRED UNI-PAK 21 TAB Take by mouth daily. Take 6 tabs by mouth daily  for 2 days, then 5 tabs for 2 days, then 4 tabs for 2 days, then 3 tabs for 2 days, 2 tabs for 2 days, then 1 tab by mouth daily for 2 days   Premarin  vaginal cream Generic drug: conjugated estrogens  Place 1 Applicatorful vaginally daily.   Rivaroxaban  15 MG Tabs tablet Commonly known as: XARELTO  Take 1 tablet (15 mg total) by mouth daily with supper.   sucralfate  1 g tablet Commonly known as: Carafate  Take 1 tablet (1 g total) by mouth 4 (four) times daily for 7 days.   Vitamin D  50 MCG (2000 UT) tablet Take 2,000 Units by mouth daily.  Contact information for follow-up providers     [facility]. Schedule an appointment as soon as possible for a visit in 1 month(s).   Specialty: Neurology Why: stroke clinic Contact information: [address] [place]  [address] [phone]        Delores Rojelio Caldron, NP Follow up.   Specialty: Nurse Practitioner Contact information: [address] [place] [address] [phone]              Contact information for after-discharge care     Home Medical Care     [facility]) .   Service: Home Health Services Contact information: [address] [place]  [phone]                    Discharge Exam: Filed Weights   12/05/23 1352  Weight: 60.8 kg  Gen  NAD,  Respiratory: No rhonchi or crepitations. Cardiovascular: S1-S2 heard. Abdomen: Soft nontender bowel sound present. Musculoskeletal: No edema. Skin: No rash. Neurologic: Alert awake oriented time place and person.  Moves all extremities 5 x 5.   No facial asymmetry tongue is midline pupils are equal and reacting to light. Psychiatric: Appears normal.  Normal affect.     Condition at discharge: stable  The results of significant diagnostics from this hospitalization (including imaging, microbiology, ancillary and laboratory) are listed below for reference.   Imaging Studies: CT HEAD WO CONTRAST ( ) Result Date: 12/06/2023 EXAM: CT HEAD WITHOUT CONTRAST 12/06/2023 04:39:06 PM TECHNIQUE: CT of the head was performed without the administration of intravenous contrast. Automated exposure control, iterative reconstruction, and/or weight based adjustment of the mA/kV was utilized to reduce the radiation dose to as low as reasonably achievable. COMPARISON: 12/05/2023 CLINICAL HISTORY: Mental status change, unknown cause; New symptoms in a known stroke patient. FINDINGS: BRAIN AND VENTRICLES: No acute hemorrhage. No evidence of acute infarct. No hydrocephalus. No extra-axial collection. No mass effect or midline shift. Mineralization in bilateral basal ganglia. ORBITS: Bilateral lens replacement. SINUSES: No acute abnormality. SOFT TISSUES AND SKULL: No acute soft tissue abnormality. No skull fracture. IMPRESSION: 1. No acute intracranial abnormality. Electronically signed by: Franky Stanford MD 12/06/2023 05:16 PM EDT RP Workstation: [id number]   ECHOCARDIOGRAM COMPLETE Result Date: 12/06/2023    ECHOCARDIOGRAM REPORT   Patient Name:   LINZIE CRISS Date of Exam: 12/06/2023 Medical Rec #:  [id number]          Height:       64.0 in Accession #:    [id number]         Weight:       134.0 lb Date of Birth:  18-Mar-1933          BSA:          1.650 m Patient Age:    89 years           BP:           114/88 mmHg Patient Gender: F                  HR:           97 bpm. Exam Location:  Inpatient Procedure: 2D Echo, Cardiac Doppler and Color Doppler (Both Spectral and Color            Flow Doppler were utilized during procedure). Indications:    Stroke  I63.9  History:        Patient  has prior history of Echocardiogram examinations, most                 recent 11/16/2022. CHF, Angina, Stroke and CKD, stage 3,                 Arrythmias:Atrial Fibrillation, Signs/Symptoms:Chest Pain; Risk                 Factors:Hypertension, Sleep Apnea, Diabetes, Dyslipidemia and                 Current Smoker.  Sonographer:    Thea Norlander RCS Referring Phys: FRANKY HALEY, N IMPRESSIONS  1. Left ventricular ejection fraction, by estimation, is 40 to 45%. The left ventricle has mildly decreased function. Left ventricular endocardial border not optimally defined to evaluate regional wall motion. Left ventricular diastolic parameters are indeterminate.  2. Right ventricular systolic function is moderately reduced. The right ventricular size is normal. There is severely elevated pulmonary artery systolic pressure.  3. Right atrial size was severely dilated.  4. The mitral valve is normal in structure. Mild mitral valve regurgitation. No evidence of mitral stenosis.  5. Tricuspid valve regurgitation is moderate.  6. The aortic valve is tricuspid. Aortic valve regurgitation is trivial. Aortic valve sclerosis/calcification is present, without any evidence of aortic stenosis. Aortic valve Vmax measures 1.24 m/s.  7. Aortic dilatation noted. There is mild dilatation of the ascending aorta, measuring 38 mm.  8. The inferior vena cava is normal in size with greater than 50% respiratory variability, suggesting right atrial pressure of 3 mmHg. FINDINGS  Left Ventricle: Left ventricular ejection fraction, by estimation, is 40 to 45%. The left ventricle has mildly decreased function. Left ventricular endocardial border not optimally defined to evaluate regional wall motion. The left ventricular internal cavity size was normal in size. There is no left ventricular hypertrophy. Left ventricular diastolic function could not be evaluated due to atrial fibrillation. Left ventricular  diastolic parameters are indeterminate. Normal left ventricular filling pressure. Right Ventricle: The right ventricular size is normal. No increase in right ventricular wall thickness. Right ventricular systolic function is moderately reduced. There is severely elevated pulmonary artery systolic pressure. The tricuspid regurgitant velocity is 285.00 m/s, and with an assumed right atrial pressure of 3 mmHg, the estimated right ventricular systolic pressure is 324903.0 mmHg. Left Atrium: Left atrial size was normal in size. Right Atrium: Right atrial size was severely dilated. Pericardium: There is no evidence of pericardial effusion. Mitral Valve: The mitral valve is normal in structure. Mild to moderate mitral annular calcification. Mild mitral valve regurgitation. No evidence of mitral valve stenosis. Tricuspid Valve: The tricuspid valve is normal in structure. Tricuspid valve regurgitation is moderate . No evidence of tricuspid stenosis. Aortic Valve: The aortic valve is tricuspid. Aortic valve regurgitation is trivial. Aortic valve sclerosis/calcification is present, without any evidence of aortic stenosis. Aortic valve peak gradient measures 6.2 mmHg. Pulmonic Valve: The pulmonic valve was normal in structure. Pulmonic valve regurgitation is mild. No evidence of pulmonic stenosis. Aorta: Aortic dilatation noted. There is mild dilatation of the ascending aorta, measuring 38 mm. Venous: The inferior vena cava is normal in size with greater than 50% respiratory variability, suggesting right atrial pressure of 3 mmHg. IAS/Shunts: No atrial level shunt detected by color flow Doppler.  LEFT VENTRICLE PLAX 2D LVIDd:         3.50 cm   Diastology LVIDs:         2.60 cm   LV e' medial:  9.25 cm/s LV PW:         0.80 cm   LV E/e' medial:  8.2 LV IVS:        0.80 cm   LV e' lateral:   12.40 cm/s LVOT diam:     1.90 cm   LV E/e' lateral: 6.1 LV SV:         29 LV SV Index:   18 LVOT Area:     2.84 cm  RIGHT VENTRICLE              IVC RV S prime:     14.90 cm/s  IVC diam: 1.40 cm TAPSE (M-mode): 1.3 cm LEFT ATRIUM             Index        RIGHT ATRIUM           Index LA diam:        2.80 cm 1.70 cm/m   RA Area:     25.80 cm LA Vol (A2C):   45.5 ml 27.57 ml/m  RA Volume:   75.60 ml  45.81 ml/m LA Vol (A4C):   33.5 ml 20.30 ml/m LA Biplane Vol: 39.0 ml 23.63 ml/m  AORTIC VALVE AV Area (Vmax): 1.68 cm AV Vmax:        124.00 cm/s AV Peak Grad:   6.2 mmHg LVOT Vmax:      73.56 cm/s LVOT Vmean:     46.840 cm/s LVOT VTI:       0.103 m  AORTA Ao Root diam: 3.00 cm Ao Asc diam:  3.80 cm MITRAL VALVE               TRICUSPID VALVE MV Area (PHT): 3.53 cm    TR Peak grad:   324900.0 mmHg MV Decel Time: 215 msec    TR Vmax:        28500.00 cm/s MV E velocity: 76.00 cm/s MV A velocity: 26.70 cm/s  SHUNTS MV E/A ratio:  2.85        Systemic VTI:  0.10 m                            Systemic Diam: 1.90 cm Wilbert Bihari MD Electronically signed by Wilbert Bihari MD Signature Date/Time: 12/06/2023/1:08:49 PM    Final    CT ANGIO HEAD NECK W WO CM Result Date: 12/06/2023 EXAM: CTA HEAD AND NECK WITH AND WITHOUT 12/06/2023 01:11:35 AM TECHNIQUE: CTA of the head and neck was performed with and without the administration of 75 mL of intravenous iohexol  (OMNIPAQUE ) 350 MG/ML injection. Multiplanar 2D and/or 3D reformatted images are provided for review. Automated exposure control, iterative reconstruction, and/or weight based adjustment of the mA/kV was utilized to reduce the radiation dose to as low as reasonably achievable. Stenosis of the internal carotid arteries measured using NASCET criteria. COMPARISON: Prior studies from 12/05/2023. CLINICAL HISTORY: Neuro deficit, acute, stroke suspected. 75ml omni 350; Aphasia, neuro deficit, acute, stroke suspected. FINDINGS: CTA NECK: AORTIC ARCH AND ARCH VESSELS: Mild aortic atherosclerosis. No dissection or arterial injury. No significant stenosis of the brachiocephalic or subclavian arteries. CERVICAL  CAROTID ARTERIES: Right common and internal carotid arteries are tortuous with mild atherosclerotic change about the right carotid bulb without significant stenosis. Left common and internal carotid arteries are tortuous with calcified plaque about the left carotid bulb without hemodynamically significant greater than 50% stenosis. No dissection or arterial injury. CERVICAL VERTEBRAL ARTERIES: Left vertebral artery  is dominant. Atheromatous plaque at the origin of the right vertebral artery with severe ostial stenosis. No dissection or arterial injury. LUNGS AND MEDIASTINUM: Sequelae of prior axillary nodal dissection on the left. SOFT TISSUES: Patient is a denture list. BONES: Advanced multilevel cervical spondylosis, most pronounced at C4-C5. CTA HEAD: ANTERIOR CIRCULATION: Mild atherosclerotic change about the carotid siphons without hemodynamically significant stenosis. No significant stenosis of the anterior cerebral arteries. No significant stenosis of the middle cerebral arteries. No aneurysm. POSTERIOR CIRCULATION: No significant stenosis of the posterior cerebral arteries. No significant stenosis of the basilar artery. No significant stenosis of the vertebral arteries. No aneurysm. OTHER: No dural venous sinus thrombosis on this non-dedicated study. IMPRESSION: 1. Negative CTA for large vessel occlusion or other emergent finding. 2. Severe ostial stenosis at the origin of the right vertebral artery. 3. Mild atherosclerosis about the carotid bulbs and carotid siphons without hemodynamically significant stenosis. 4. Aortic atherosclerosis. Electronically signed by: Morene Hoard MD 12/06/2023 01:56 AM EDT RP Workstation: [id number]   MR BRAIN WO CONTRAST Result Date: 12/05/2023 EXAM: MRI BRAIN WITHOUT CONTRAST 12/05/2023 08:50:25 PM TECHNIQUE: Multiplanar multisequence MRI of the head/brain was performed without the administration of intravenous contrast. COMPARISON: CT from earlier the same day.  CLINICAL HISTORY: Neuro deficit, acute, stroke suspected. Since Sunday, patient has had difficulty with memory recall. She also began having difficulty speaking yesterday. Patient combative and agitated, all images sent. FINDINGS: BRAIN AND VENTRICLES: Generalized age-related atrophy with mild chronic microvascular ischemic disease. Patchy small volume restricted diffusion seen involving the left insular cortex and overlying left frontal lobe (series 5, images 54, 57) consistent with a small acute ischemic left MCA distribution infarct. No associated hemorrhage or mass effect. Single chronic microhemorrhage noted within the left temporal occipital region, of doubtful significance in isolation. No mass. No midline shift. No hydrocephalus. The sellar/suprasellar regions appear unremarkable. Normal flow voids. ORBITS: Prior bilateral ocular lens replacement. SINUSES AND MASTOIDS: No acute abnormality. BONES AND SOFT TISSUES: Normal marrow signal. No acute soft tissue abnormality. Advanced spondylosis within the visualized upper cervical spine. IMPRESSION: 1. Small acute ischemic infarcts involving the left insula and overlying left frontal lobe. No associated hemorrhage or mass effect. 2. Underlying atrophy with mild chronic microvascular ischemic disease. Electronically signed by: Benjamin Mcclintock MD 12/05/2023 10:12 PM EDT RP Workstation: [id number]   CT HEAD WO CONTRAST Result Date: 12/05/2023 CLINICAL DATA:  Speech problems beginning yesterday. EXAM: CT HEAD WITHOUT CONTRAST TECHNIQUE: Contiguous axial images were obtained from the base of the skull through the vertex without intravenous contrast. RADIATION DOSE REDUCTION: This exam was performed according to the departmental dose-optimization program which includes automated exposure control, adjustment of the mA and/or kV according to patient size and/or use of iterative reconstruction technique. COMPARISON:  12/27/2022 FINDINGS: Brain: Ventricles,  cisterns and other CSF spaces are normal. No evidence of mass, mass effect, shift of midline structures or acute hemorrhage. Mild chronic ischemic microvascular disease is present. Bilateral basal ganglia calcifications. Vascular: No hyperdense vessel or unexpected calcification. Skull: Normal. Negative for fracture or focal lesion. Sinuses/Orbits: No acute finding. Other: None. IMPRESSION: 1. No acute findings. 2. Mild chronic ischemic microvascular disease. Electronically Signed   By: Daniel  Boyle M.D.   On: 12/05/2023 15:08   NCV with EMG (electromyography) Result Date: 11/23/2023 Newton, Frederic, MD     10 /22/2025  9:13 AM EMG & NCV Findings: Evaluation of the left median motor and the right median motor nerves showed prolonged distal onset latency (L4.8, R4.8 ms), reduced amplitude (  L2.5, R2.2 mV), and decreased conduction velocity (Elbow-Wrist, L41, R42 m/s).  The left ulnar motor nerve showed decreased conduction velocity (A Elbow-B Elbow, 50 m/s).  The left median (across palm) sensory nerve showed no response (Palm), prolonged distal peak latency (4.7 ms), and reduced amplitude (8.6 V).  The right median (across palm) sensory nerve showed no response (Wrist) and no response (Palm).  The left ulnar sensory nerve showed prolonged distal peak latency (3.8 ms) and decreased conduction velocity (Wrist-5th Digit, 37 m/s).  The right ulnar sensory nerve showed prolonged distal peak latency (6.0 ms), reduced amplitude (8.6 V), and decreased conduction velocity (Wrist-5th Digit, 23 m/s).  All remaining nerves (as indicated in the following tables) were within normal limits.  Left vs. Right side comparison data for the ulnar motor nerve indicates abnormal L-R amplitude difference (26.8 %).  The ulnar sensory nerve indicates abnormal L-R latency difference (2.2 ms).  All remaining left vs. right side differences were within normal limits.  Needle evaluation of the right abductor pollicis brevis muscle showed  increased insertional activity and moderately increased spontaneous activity.  All remaining muscles (as indicated in the following table) showed no evidence of electrical instability.  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  Active motor units on needle EMG portend a better outcome for recovery.  **As you know, this particular electrodiagnostic study cannot rule out chemical radiculitis or sensory only radiculopathy. There is no significant electrodiagnostic evidence of any other focal nerve entrapment, brachial plexopathy or cervical radiculopathy. Recommendations: 1.  Follow-up with referring physician. 2.  Continue current management of symptoms. 3.  Continue use of resting splint at night-time and as needed during the day. 4.  Suggest surgical evaluation. ___________________________ Prentice Masters FAAPMR Board Certified, American Board of Physical Medicine and Rehabilitation Nerve Conduction Studies Anti Sensory Summary Table  Stim Site NR Peak (ms) Norm Peak (ms) P-T Amp (V) Norm P-T Amp Site1 Site2 Delta-P (ms) Dist (cm) Vel (m/s) Norm Vel (m/s) Left Median Acr Palm Anti Sensory (2nd Digit)  31.9C Wrist    *4.7 <3.6 *8.6 >10 Wrist Palm  0.0   Palm *NR  <2.0         Right Median Acr Palm Anti Sensory (2nd Digit)  31.6C Wrist *NR  <3.6  >10 Wrist Palm  0.0   Palm *NR  <2.0         Left Radial Anti Sensory (Base 1st Digit)  32C Wrist    2.5 <3.1 16.4  Wrist Base 1st Digit 2.5 0.0   Right Radial Anti Sensory (Base 1st Digit)  31C Wrist    2.5 <3.1 6.4  Wrist Base 1st Digit 2.5 0.0   Left Ulnar Anti Sensory (5th Digit)  32.4C Wrist    *3.8 <3.7 16.6 >15.0 Wrist 5th Digit 3.8 14.0 *37 >38 Right Ulnar Anti Sensory (5th Digit)  31.6C Wrist    *6.0 <3.7 *8.6 >15.0 Wrist 5th Digit 6.0 14.0 *23 >38 B Elbow    14.2  4.4  B Elbow Wrist 8.2 0.0  >47 Motor  Summary Table  Stim Site NR Onset (ms) Norm Onset (ms) O-P Amp (mV) Norm O-P Amp Site1 Site2 Delta-0 (ms) Dist (cm) Vel (m/s) Norm Vel (m/s) Left Median Motor (Abd Poll Brev)  32.2C Wrist    *4.8 <4.2 *2.5 >5 Elbow Wrist 5.3 21.5 *41 >50 Elbow    10.1  1.9        Right Median Motor (Abd Poll Brev)  30.8C Wrist    *4.8 <4.2 *2.2 >5 Elbow Wrist 5.1 21.5 *42 >50 Elbow    9.9  2.0        Left Ulnar Motor (Abd Dig Min)  32.2C Wrist    3.3 <4.2 5.6 >3 B Elbow Wrist 3.8 20.0 53 >53 B Elbow    7.1  4.4  A Elbow B Elbow 2.0 10.0 *50 >53 A Elbow    9.1  2.8        Right Ulnar Motor (Abd Dig Min)  31.4C Wrist    3.7 <4.2 4.1 >3 B Elbow Wrist 4.0 21.0 53 >53 B Elbow    7.7  3.5  A Elbow B Elbow 1.8 10.0 56 >53 A Elbow    9.5  2.7        EMG  Side Muscle Nerve Root Ins Act Fibs Psw Amp Dur Poly Recrt Int Bruna Comment Right Abd Poll Brev Median C8-T1 *Incr *2+ *2+ Nml Nml 0 Nml Nml  Right 1stDorInt Ulnar C8-T1 Nml Nml Nml Nml Nml 0 Nml Nml  Right PronatorTeres Median C6-7 Nml Nml Nml Nml Nml 0 Nml Nml  Right Biceps Musculocut C5-6 Nml Nml Nml Nml Nml 0 Nml Nml  Right Deltoid Axillary C5-6 Nml Nml Nml Nml Nml 0 Nml Nml  Nerve Conduction Studies Anti Sensory Left/Right Comparison  Stim Site L Lat (ms) R Lat (ms) L-R Lat (ms) L Amp (V) R Amp (V) L-R Amp (%) Site1 Site2 L Vel (m/s) R Vel (m/s) L-R Vel (m/s) Median Acr Palm Anti Sensory (2nd Digit)  31.9C Wrist *4.7   *8.6   Wrist Palm    Palm            Radial Anti Sensory (Base 1st Digit)  32C Wrist 2.5 2.5 0.0 16.4 6.4 61.0 Wrist Base 1st Digit    Ulnar Anti Sensory (5th Digit)  32.4C Wrist *3.8 *6.0 *2.2 16.6 *8.6 48.2 Wrist 5th Digit *37 *23 14 Motor Left/Right Comparison  Stim Site L Lat (ms) R Lat (ms) L-R Lat (ms) L Amp (mV) R Amp (mV) L-R Amp (%) Site1 Site2 L Vel (m/s) R Vel (m/s) L-R Vel (m/s) Median Motor (Abd Poll Brev)  32.2C Wrist *4.8 *4.8 0.0 *2.5 *2.2 12.0 Elbow Wrist *41 *42 1 Elbow 10.1 9.9 0.2 1.9 2.0 5.0      Ulnar Motor (Abd Dig Min)  32.2C Wrist 3.3  3.7 0.4 5.6 4.1 *26.8 B Elbow Wrist 53 53 0 B Elbow 7.1 7.7 0.6 4.4 3.5 20.5 A Elbow B Elbow *50 56 6 A Elbow 9.1 9.5 0.4 2.8 2.7 3.6      Waveforms:              Microbiology: Results for orders placed or performed during the hospital encounter of 09/11/20  Resp Panel by RT-PCR (Flu A&B, Covid) Nasopharyngeal Swab     Status: None   Collection Time: 09/11/20  7:44 PM   Specimen: Nasopharyngeal Swab; Nasopharyngeal(NP) swabs in vial transport medium  Result Value Ref Range Status   SARS Coronavirus 2 by RT PCR NEGATIVE NEGATIVE Final    Comment: (NOTE) SARS-CoV-2 target nucleic acids are NOT DETECTED.  The SARS-CoV-2 RNA is generally detectable in upper respiratory specimens during the acute phase of infection. The lowest concentration of SARS-CoV-2 viral copies this assay can detect is 138 copies/mL. A negative result does not preclude SARS-Cov-2 infection and should not  be used as the sole basis for treatment or other patient management decisions. A negative result may occur with  improper specimen collection/handling, submission of specimen other than nasopharyngeal swab, presence of viral mutation(s) within the areas targeted by this assay, and inadequate number of viral copies(<138 copies/mL). A negative result must be combined with clinical observations, patient history, and epidemiological information. The expected result is Negative.  Fact Sheet for Patients:  bloggercourse.com  Fact Sheet for Healthcare Providers:  seriousbroker.it  This test is no t yet approved or cleared by the [place]  FDA and  has been authorized for detection and/or diagnosis of SARS-CoV-2 by FDA under an Emergency Use Authorization (EUA). This EUA will remain  in effect (meaning this test can be used) for the duration of the COVID-19 declaration under Section 564(b)(1) of the Act, 21 U.S.C.section 360bbb-3(b)(1), unless the authorization is  terminated  or revoked sooner.       Influenza A by PCR NEGATIVE NEGATIVE Final   Influenza B by PCR NEGATIVE NEGATIVE Final    Comment: (NOTE) The Xpert Xpress SARS-CoV-2/FLU/RSV plus assay is intended as an aid in the diagnosis of influenza from Nasopharyngeal swab specimens and should not be used as a sole basis for treatment. Nasal washings and aspirates are unacceptable for Xpert Xpress SARS-CoV-2/FLU/RSV testing.  Fact Sheet for Patients: bloggercourse.com  Fact Sheet for Healthcare Providers: seriousbroker.it  This test is not yet approved or cleared by the [place]  FDA and has been authorized for detection and/or diagnosis of SARS-CoV-2 by FDA under an Emergency Use Authorization (EUA). This EUA will remain in effect (meaning this test can be used) for the duration of the COVID-19 declaration under Section 564(b)(1) of the Act, 21 U.S.C. section 360bbb-3(b)(1), unless the authorization is terminated or revoked.  Performed at [facility], [address] [place], [place] [address]     Labs: CBC: Recent Labs  Lab 12/05/23 1357 12/06/23 0157  WBC 7.3 6.4  NEUTROABS 5.3  --   HGB 10.6* 10.3*  HCT 33.3* 32.3*  MCV 88.8 88.0  PLT 407* 393   Basic Metabolic Panel: Recent Labs  Lab 12/05/23 1357 12/06/23 0157  NA 137 134*  K 4.0 3.7  CL 100 96*  CO2 25 23  GLUCOSE 106* 87  BUN 11 9  CREATININE 0.88 0.77  CALCIUM  9.6 8.8*   Liver Function Tests: Recent Labs  Lab 12/05/23 1357 12/06/23 0157  AST 17 16  ALT <5 7  ALKPHOS 58 46  BILITOT 0.4 0.7  PROT 8.0 5.6*  ALBUMIN 4.0 3.1*   CBG: Recent Labs  Lab 12/05/23 1354 12/06/23 1750 12/06/23 2142 12/07/23 0625  GLUCAP 109* 101* 131* 93    Discharge time spent: greater than 30 minutes.  Signed: Landon FORBES Baller, MD [facility] 12/07/2023

## 2023-12-10 ENCOUNTER — Encounter: Payer: Self-pay | Admitting: Radiology

## 2023-12-11 ENCOUNTER — Ambulatory Visit: Admitting: Orthopedic Surgery

## 2023-12-11 DIAGNOSIS — G5603 Carpal tunnel syndrome, bilateral upper limbs: Secondary | ICD-10-CM

## 2023-12-11 NOTE — Progress Notes (Signed)
 [name] - 89 y.o. female MRN [id number]  Date of birth: 1933-10-13  Office Visit Note: Visit Date: 12/11/2023 PCP: Delores Rojelio Caldron, NP Referred by: Eldonna Novel, MD  Subjective: No chief complaint on file.  HPI: [name] is a pleasant 89 y.o. female who presents today with her sons for bilateral hand numbness and tingling that is been present now for multiple months, worsening in nature.  She has a history of a recent CVA, is on Xarelto  at baseline for atrial fibrillation.  Was recently placed onto a steroid taper as well.  Has ongoing numbness and tingling throughout the course of the day and nocturnal symptoms of night, has trialed bracing without lasting relief.  Symptoms are worse on the right than the left.  She is here today for specific hand surgical evaluation.  Pertinent ROS were reviewed with the patient and found to be negative unless otherwise specified above in HPI.   Visit Reason: bilateral hand R>L Duration of symptoms: 3+ months Hand dominance: right Occupation: retired Diabetic: no Smoking: No Heart/Lung History: A-fib Blood Thinners: xarelto   Prior Testing/EMG: EMG Injections (Date): none Treatments: none Prior Surgery: none    Assessment & Plan: Visit Diagnoses:  1. Bilateral carpal tunnel syndrome     Plan: Extensive discussion was had with the patient and her sons today about her ongoing bilateral carpal tunnel syndrome that is refractory to conservative care.  Patient has both clinical and electrodiagnostic evidence to confirm this diagnosis.  At this juncture, given the severity of her disease, she is indicated for bilateral, staged open carpal tunnel release.  Forms of anesthesia were discussed, patient would like to have surgery performed under local anesthesia.  Risks include but not limited to infection, bleeding, scarring, stiffness, nerve injury or vascular, tendon injury, risk of recurrence and need for subsequent  operation were all discussed in detail.  Patient consented understanding the above.  Will move forward surgical scheduling.  She would like to begin with the right side.  Will plan for surgical scheduling of the right open carpal tunnel release under local anesthesia.  Given her recent stroke, she is okay to continue Xarelto  leading into surgery.  Her blood sugars are well-controlled, she is no longer taking oral steroids.   Follow-up: No follow-ups on file.   Meds & Orders: No orders of the defined types were placed in this encounter.  No orders of the defined types were placed in this encounter.    Procedures: No procedures performed      Clinical History: EXAM: MRI CERVICAL SPINE WITHOUT CONTRAST   TECHNIQUE: Multiplanar, multisequence MR imaging of the cervical spine was performed. No intravenous contrast was administered.   COMPARISON:  None Available.   FINDINGS: Alignment: Grade 1 anterolisthesis at C5-6   Vertebrae: No fracture, evidence of discitis, or bone lesion.   Cord: Normal signal and morphology.   Posterior Fossa, vertebral arteries, paraspinal tissues: Negative.   Disc levels:   C1-2: Unremarkable.   C2-3: Small disc bulge with bilateral uncovertebral hypertrophy. No spinal canal stenosis. Moderate bilateral neural foraminal stenosis.   C3-4: Medium-sized disc bulge with large uncovertebral osteophytes. Severe spinal canal stenosis. Severe bilateral neural foraminal stenosis.   C4-5: Small disc bulge with uncovertebral hypertrophy. Moderate spinal canal stenosis. Moderate right and mild left neural foraminal stenosis.   C5-6: Small disc bulge with moderate bilateral uncovertebral hypertrophy. There is no spinal canal stenosis. Moderate bilateral neural foraminal stenosis.   C6-7: Small disc bulge. There is  no spinal canal stenosis. Mild left neural foraminal stenosis.   C7-T1: Medium-sized disc bulge with uncovertebral hypertrophy. Mild spinal  canal stenosis. Severe bilateral neural foraminal stenosis.   IMPRESSION: 1. Multilevel cervical degenerative disc disease with severe spinal canal stenosis at C3-4 and moderate spinal canal stenosis at C4-5. 2. Multilevel moderate to severe neural foraminal stenosis, worst at C3-4 and C7-T1.     Electronically Signed   By: Franky Stanford M.D.   On: 06/22/2021 00:11  She reports that she has never smoked. Her smokeless tobacco use includes snuff.  Recent Labs    12/06/23 0157  HGBA1C 5.8*    Objective:   Vital Signs: There were no vitals taken for this visit.  Physical Exam  Gen: Well-appearing, in no acute distress; non-toxic CV: Regular Rate. Well-perfused. Warm.  Resp: Breathing unlabored on room air; no wheezing. Psych: Fluid speech in conversation; appropriate affect; normal thought process  Ortho Exam PHYSICAL EXAM:  General: Patient is well appearing and in no distress.   Skin and Muscle: No skin changes are apparent to upper extremities.   Range of Motion and Palpation Tests: Mobility is full about the elbows with flexion and extension. Forearm supination and pronation are 60/50 bilaterally.  Wrist flexion/extension is 55/45 bilaterally.  Digital flexion and extension are limited secondary to pain.  Thumb opposition is full to the base of the small fingers bilaterally.    No cords or nodules are palpated.  No triggering is observed.     Neurologic, Vascular, Motor: Sensation is diminished to light touch in the bilateral median nerve distribution.    Thenar atrophy: Present bilateral Tinel sign: Positive bilateral carpal tunnel Carpal tunnel compression: Positive bilateral Phalen test: Positive bilateral  Sensory bilateral hand 2-point discrimination (thumb, index, middle): Indiscernible, greater than 10 mm  Motor bilateral hand APB: 3+/5, thumb opposition is preserved   Fingers pink and well perfused.  Capillary refill is brisk.     Lab Results   Component Value Date   HGBA1C 5.8 (H) 12/06/2023      Imaging: No results found.  Past Medical/Family/Surgical/Social History: Medications & Allergies reviewed per EMR, new medications updated. Patient Active Problem List   Diagnosis Date Noted   Acute CVA (cerebrovascular accident) (HCC) 12/05/2023   CHF exacerbation (HCC) 09/12/2020   Hypomagnesemia 09/12/2020   Acute on chronic diastolic CHF (congestive heart failure) (HCC) 09/11/2020   OSA on CPAP    CKD (chronic kidney disease), stage III (HCC)    Anemia    Hypokalemia    Benign essential hypertension 07/15/2020   Malnutrition of moderate degree 07/09/2020   Chest pain of uncertain etiology    Unstable angina (HCC) 07/03/2020   Primary osteoarthritis, right ankle and foot 01/24/2018   Chronic idiopathic constipation 09/28/2017   Arthritis, multiple joint involvement 09/28/2017   Arthritis of right ankle 08/21/2017   Controlled type 2 diabetes mellitus with hyperglycemia, without long-term current use of insulin  (HCC) 08/17/2017   Allergic urticaria 09/07/2016   Generalized pruritus 09/07/2016   Moderate persistent asthma 09/07/2016   Perennial and seasonal allergic rhinitis 09/07/2016   History of breast cancer in female 01/12/2016   Chest pain 01/06/2015   Angina at rest 01/06/2015   Diabetes (HCC) 01/06/2015   Persistent atrial fibrillation (HCC) 01/06/2015   HLD (hyperlipidemia) 01/06/2015   Tobacco abuse 01/06/2015   Type II diabetes mellitus (HCC)    Painful total knee replacement 01/28/2013   Chronic obstructive asthma (HCC) 06/18/2012   Atrial fibrillation (HCC)  Left shoulder pain    Hypertension    Past Medical History:  Diagnosis Date   Arthritis    all over   Atrial fibrillation (HCC)    Diagnosed ~2009   Breast cancer, left breast (HCC) 02/07/1996   S/P chemo and mastectomy    CHF (congestive heart failure) (HCC)    GERD (gastroesophageal reflux disease)    Hyperlipidemia     Hypertension    Left shoulder pain    MRI showed pinched nerve - per pt   MI (myocardial infarction) (HCC)    OSA on CPAP    Pneumonia    4-5 times (01/06/2015)   Type II diabetes mellitus (HCC)    Family History  Problem Relation Age of Onset   Heart attack Mother        53s   Emphysema Brother    Allergic rhinitis Neg Hx    Angioedema Neg Hx    Asthma Neg Hx    Eczema Neg Hx    Immunodeficiency Neg Hx    Urticaria Neg Hx    Past Surgical History:  Procedure Laterality Date   BIOPSY  07/23/2022   Procedure: BIOPSY;  Surgeon: Kriss Estefana DEL, DO;  Location: [facility];  Service: Gastroenterology;;   BREAST BIOPSY Left 02/07/1996   CATARACT EXTRACTION W/ INTRAOCULAR LENS  IMPLANT, BILATERAL Bilateral    ESOPHAGOGASTRODUODENOSCOPY N/A 07/23/2022   Procedure: ESOPHAGOGASTRODUODENOSCOPY (EGD);  Surgeon: Kriss Estefana DEL, DO;  Location: [facility];  Service: Gastroenterology;  Laterality: N/A;   JOINT REPLACEMENT     LAPAROSCOPIC CHOLECYSTECTOMY     LEFT HEART CATH AND CORONARY ANGIOGRAPHY N/A 07/07/2020   Procedure: LEFT HEART CATH AND CORONARY ANGIOGRAPHY;  Surgeon: Dann Candyce RAMAN, MD;  Location: [facility] INVASIVE CV LAB;  Service: Cardiovascular;  Laterality: N/A;   MASTECTOMY Left 02/07/1996   TOTAL KNEE ARTHROPLASTY Left 02/07/1999   TOTAL KNEE REVISION Left 01/28/2013   Procedure: LEFT TOTAL KNEE ARTHROPLASTY REVISION;  Surgeon: Kay Ozell Cummins, MD;  Location: [facility] OR;  Service: Orthopedics;  Laterality: Left;   Social History   Occupational History   Not on file  Tobacco Use   Smoking status: Never   Smokeless tobacco: Current    Types: Snuff  Vaping Use   Vaping status: Never Used  Substance and Sexual Activity   Alcohol use: Not Currently    Comment: 01/06/2015 I'll have a beer q once in awhile   Drug use: No   Sexual activity: Never    Jermya Dowding Estela) Arlinda, M.D. [facility], Hand Surgery

## 2023-12-19 ENCOUNTER — Other Ambulatory Visit: Payer: Self-pay

## 2023-12-19 DIAGNOSIS — G5603 Carpal tunnel syndrome, bilateral upper limbs: Secondary | ICD-10-CM

## 2023-12-26 ENCOUNTER — Other Ambulatory Visit: Payer: Self-pay | Admitting: Orthopedic Surgery

## 2023-12-26 MED ORDER — ACETAMINOPHEN-CODEINE 300-30 MG PO TABS: 1.0000 | ORAL_TABLET | Freq: Four times a day (QID) | ORAL | 0 refills | Status: DC | PRN

## 2023-12-27 DIAGNOSIS — G5601 Carpal tunnel syndrome, right upper limb: Secondary | ICD-10-CM | POA: Diagnosis not present

## 2023-12-31 ENCOUNTER — Encounter: Admitting: Orthopedic Surgery

## 2023-12-31 ENCOUNTER — Other Ambulatory Visit

## 2023-12-31 ENCOUNTER — Ambulatory Visit (INDEPENDENT_AMBULATORY_CARE_PROVIDER_SITE_OTHER): Admitting: Orthopedic Surgery

## 2023-12-31 VITALS — BP 118/79 | HR 70 | Ht 64.0 in | Wt 134.0 lb

## 2023-12-31 DIAGNOSIS — M542 Cervicalgia: Secondary | ICD-10-CM

## 2024-01-01 NOTE — Progress Notes (Signed)
 Orthopedic Spine Surgery Office Note  Assessment: Patient is a 89 y.o. female with cervical radiculopathy.  Her pain is from the neck to the level of the elbow.  Has foraminal stenosis bilaterally at C3/4 and C4/5   Plan: - Patient has tried all the typical conservative treatments for cervical radiculopathy without any relief of her symptoms, so discussed operative management in the form of a C3-5 ACDF.  Although she does have multiple areas with foraminal stenosis, these levels would match the areas that she experiences the pain.  I covered the risks, benefits, and alternatives of surgery with her.  Patient had concerns about her age and surgery so she wanted to think about it more.  She will let me know if she would like to proceed -Patient has tried PT, Tylenol , ibuprofen, gabapentin , oral prednisone , steroid injections, muscle relaxers, Norco   Patient expressed understanding of the plan and all questions were answered to the patient's satisfaction.   ___________________________________________________________________________   History:  Patient is a 89 y.o. female who presents today for cervical spine.  Patient has had several years of neck pain that radiates into her bilateral upper extremities.  It has gotten progressively worse with time.  She feels the pain starts in her neck and goes into her shoulders and into the lateral arm to the level of the elbow.  It does not radiate past the elbow.  She feels pain with activity and at rest.  She feels pain on a daily basis.  She had temporary relief with a cervical ESI but that wore off.  She got subsequent ESI's that did not provide her with any relief.  The other conservative treatments she has tried have not given her any relief even initially.  No bowel or bladder incontinence.  Has had issues with imbalance and uses a walker at baseline.  No trouble with fine motor skills in the hands.  Treatments tried: PT, Tylenol , ibuprofen, gabapentin ,  oral prednisone , steroid injections, muscle relaxers, Norco  Review of systems: Denies fevers and chills, night sweats, unexplained weight loss. Has had pain that wakes her at night.  Has a history of breast cancer  Past medical history: Atrial fibrillation Breast cancer Myocardial infarction Diabetes (last A1c was 5.8 on 12/06/2023) PSA GERD HLD HTN  Allergies: ferumoxytol , lisinopril   Past surgical history:  Carpal tunnel release Breast cancer excision Cataract surgery Total knee replacement and subsequent revision Cholecystectomy  Social history: Denies use of nicotine product (smoking, vaping, patches, smokeless) Alcohol use: Denies Denies recreational drug use   Physical Exam:  BMI of 23.0  General: no acute distress, appears stated age Neurologic: alert, answering questions appropriately, following commands Respiratory: unlabored breathing on room air, symmetric chest rise Psychiatric: appropriate affect, normal cadence to speech   MSK (spine):  -Strength exam      Left  Right Grip strength                5/5  5/5 Interosseus   5/5   5/5 Wrist extension  5/5  5/5 Wrist flexion   5/5  5/5 Elbow flexion   5/5  5/5 Deltoid    5/5  5/5  -Sensory exam    Sensation intact to light touch in L3-S1 nerve distributions of bilateral lower extremities  Sensation intact to light touch in C5-T1 nerve distributions of bilateral upper extremities  -Brachioradialis DTR: 1/4 on the left, 1/4 on the right -Biceps DTR: 1/4 on the left, 1/4 on the right  -Spurling: Negative bilaterally -Hoffman  sign: Negative bilaterally -Clonus: No beats bilaterally -Interosseous wasting: None seen -Grip and release test: Negative  No pain through range of motion of either shoulder   Imaging: XRs of the cervical spine from 01/01/2024 were independently reviewed and interpreted, showing disc height loss with at multiple levels in the cervical spine.  There is anterior osteophyte  formation at multiple levels.  No fracture or dislocation seen.  No evidence of instability on flexion/extension views.  MRI of the cervical spine from 10/27/2023 was independently reviewed and interpreted, showing bilateral foraminal stenosis at C3/4, C4/5, C5/6, C6/7, and C7/T1.  Central stenosis seen at C3 3/4 and C4/5.  No T2 cord signal change seen.   Patient name: [name] Patient MRN: [id number] Date of visit: 12/31/2023

## 2024-01-02 NOTE — Progress Notes (Signed)
 [facility] [address] [place]. [place] [address] [phone]       HOSPITAL FOLLOW UP NOTE  [name] Date of Birth: [birth year] Medical Record Number: [id number]   Reason for Referral:  hospital stroke follow up    SUBJECTIVE:   CHIEF COMPLAINT:  Chief Complaint  Patient presents with   RM2/STROKE    Pt is here with her Son. Pt states she has been doing good since her stroke.     HPI:   [name] is a 89 y.o. who  has a past medical history of Arthritis, Atrial fibrillation (HCC), Breast cancer, left breast (HCC) (02/07/1996), CHF (congestive heart failure) (HCC), GERD (gastroesophageal reflux disease), Hyperlipidemia, Hypertension, Left shoulder pain, MI (myocardial infarction) (HCC), OSA on CPAP, Pneumonia, and Type II diabetes mellitus (HCC).  Patient presented on 12/05/2023 with acute onset slurred speech. CT unremarkable. MRI of the brain shows small acute infarct involving the left insula. 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 No TNK, outside window. She had missed Xarelto  for a couple of days. LDL 77. Atorvastatin  continued. A1C 5.8. Xarelto  resumed. Personally reviewed hospitalization pertinent progress notes, lab work and imaging.  Evaluated by Dr Jerri.   Since discharge, she reports doing fairly well. She feels speech is back to baseline. She feels balance is unsteady. She has used a [employer] Ex[employer] Officer for years. No recent falls. She has chronic neck, back and shoulder pain. She is s/p CTR with [facility]. She is scheduled to see OT tomorrow for CTR eval but has not heard back to schedule PT from hospital visit.   BP is typically 120s/70s. She continues metoprolol  and losartan. She is tolerating atorvastatin . Taking Xarelto  consistently. No missed doses.   She lives with her daughter and son-in-law. She has a caregiver that comes during the week for a few hours to  help her with meals and hygiene. She has a son that lives within walking distance and two other children that live out of town. She does not drive.    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 MRI left insular cortex and left frontal lobe 2-3 small infarcts 2D Echo EF 40-45%   A1C Lab Results  Component Value Date   HGBA1C 5.8 (H) 12/06/2023    Lipid Panel     Component Value Date/Time   CHOL 137 12/06/2023 0157   TRIG 59 12/06/2023 0157   HDL 48 12/06/2023 0157   CHOLHDL 2.9 12/06/2023 0157   VLDL 12 12/06/2023 0157   LDLCALC 77 12/06/2023 0157   LDLDIRECT 46 12/22/2020 1549      ROS:   14 system review of systems performed and negative with exception of those listed in HPI  PMH:  Past Medical History:  Diagnosis Date   Arthritis    all over   Atrial fibrillation (HCC)    Diagnosed ~2009   Breast cancer, left breast (HCC) 02/07/1996   S/P chemo and mastectomy    CHF (congestive heart failure) (HCC)    GERD (gastroesophageal reflux disease)    Hyperlipidemia    Hypertension    Left shoulder pain    MRI showed pinched nerve - per pt   MI (myocardial infarction) (HCC)    OSA on CPAP    Pneumonia    4-5 times (01/06/2015)   Type II diabetes mellitus (HCC)     PSH:  Past Surgical History:  Procedure Laterality Date   BIOPSY  07/23/2022   Procedure: BIOPSY;  Surgeon: Kriss Estefana DEL, DO;  Location: [facility];  Service: Gastroenterology;;   BREAST BIOPSY Left 02/07/1996   CARPAL TUNNEL RELEASE Right 2025   CATARACT EXTRACTION W/ INTRAOCULAR LENS  IMPLANT, BILATERAL Bilateral    ESOPHAGOGASTRODUODENOSCOPY N/A 07/23/2022   Procedure: ESOPHAGOGASTRODUODENOSCOPY (EGD);  Surgeon: Kriss Estefana DEL, DO;  Location: [facility];  Service: Gastroenterology;  Laterality: N/A;   JOINT REPLACEMENT     LAPAROSCOPIC CHOLECYSTECTOMY     LEFT HEART CATH AND CORONARY ANGIOGRAPHY N/A 07/07/2020    Procedure: LEFT HEART CATH AND CORONARY ANGIOGRAPHY;  Surgeon: Dann Candyce RAMAN, MD;  Location: [facility] INVASIVE CV LAB;  Service: Cardiovascular;  Laterality: N/A;   MASTECTOMY Left 02/07/1996   TOTAL KNEE ARTHROPLASTY Left 02/07/1999   TOTAL KNEE REVISION Left 01/28/2013   Procedure: LEFT TOTAL KNEE ARTHROPLASTY REVISION;  Surgeon: Kay Ozell Cummins, MD;  Location: [facility] OR;  Service: Orthopedics;  Laterality: Left;    Social History:  Social History   Socioeconomic History   Marital status: Widowed    Spouse name: Not on file   Number of children: Not on file   Years of education: Not on file   Highest education level: Not on file  Occupational History   Not on file  Tobacco Use   Smoking status: Never   Smokeless tobacco: Current    Types: Snuff  Vaping Use   Vaping status: Never Used  Substance and Sexual Activity   Alcohol use: Not Currently    Comment: 01/06/2015 I'll have a beer q once in awhile   Drug use: No   Sexual activity: Never  Other Topics Concern   Not on file  Social History Narrative   Moved from the [place] area to [place] ~ 22yrs ago to live with her daughter   Social Drivers of Corporate Investment Banker Strain: Low Risk  (06/13/2022)   Received from [facility]   Overall Financial Resource Strain (CARDIA)    Difficulty of Paying Living Expenses: Not hard at all  Food Insecurity: Patient Declined (12/06/2023)   Hunger Vital Sign    Worried About Running Out of Food in the Last Year: Patient declined    Ran Out of Food in the Last Year: Patient declined  Transportation Needs: No Transportation Needs (06/13/2022)   Received from [employer] - Transportation    Lack of Transportation (Medical): No    Lack of Transportation (Non-Medical): No  Physical Activity: Not on file  Stress: Not on file  Social Connections: Unknown (05/16/2022)   [facility] from [facility]   Social Network    Social Network: Not on file  Intimate Partner  Violence: Not At Risk (12/06/2023)   Humiliation, Afraid, Rape, and Kick questionnaire    Fear of Current or Ex-Partner: No    Emotionally Abused: No    Physically Abused: No    Sexually Abused: No    Family History:  Family History  Problem Relation Age of Onset   Heart attack Mother        110s   Emphysema Brother    Allergic rhinitis Neg Hx    Angioedema Neg Hx    Asthma Neg Hx    Eczema Neg Hx    Immunodeficiency Neg Hx    Urticaria Neg Hx     Medications:   Current Outpatient Medications on File Prior to Visit  Medication Sig Dispense  Refill   acetaminophen -codeine  (TYLENOL  #3) 300-30 MG tablet Take 1 tablet by mouth every 6 (six) hours as needed. 30 tablet 0   albuterol  (VENTOLIN  HFA) 108 (90 Base) MCG/ACT inhaler Inhale 2 puffs into the lungs every 4 (four) hours as needed for wheezing or shortness of breath.     alendronate (FOSAMAX) 70 MG tablet Take 70 mg by mouth once a week.     atorvastatin  (LIPITOR) 40 MG tablet TAKE 1 TABLET BY MOUTH EVERY DAY 90 tablet 3   Cholecalciferol  (VITAMIN D ) 2000 units tablet Take 2,000 Units by mouth daily.     dorzolamide (TRUSOPT) 2 % ophthalmic solution Place 1 drop into both eyes 2 (two) times daily.     FLOVENT  HFA 110 MCG/ACT inhaler TAKE 2 PUFFS BY MOUTH TWICE A DAY 12 g 0   fluticasone  (FLONASE ) 50 MCG/ACT nasal spray Place 1 spray into both nostrils as needed.     furosemide  (LASIX ) 40 MG tablet Take 1 tablet (40 mg total) by mouth daily. 90 tablet 3   hydrocortisone  (ANUSOL -HC) 2.5 % rectal cream Place 1 Application rectally 2 (two) times daily. 30 g 0   lidocaine  (LIDODERM ) 5 % Place 1 patch onto the skin daily. Remove & Discard patch within 12 hours or as directed by MD 14 patch 0   liver oil-zinc  oxide (DESITIN) 40 % ointment Apply topically as needed for irritation. 56.7 g 0   losartan (COZAAR) 25 MG tablet Take 25 mg by mouth daily.     pantoprazole  (PROTONIX ) 40 MG tablet Take 1 tablet (40 mg total) by mouth daily. 30  tablet 1   Rivaroxaban  (XARELTO ) 15 MG TABS tablet Take 1 tablet (15 mg total) by mouth daily with supper. 30 tablet 1   conjugated estrogens  (PREMARIN ) vaginal cream Place 1 Applicatorful vaginally daily. (Patient not taking: Reported on 01/08/2024) 42.5 g 12   diclofenac  Sodium (VOLTAREN ) 1 % GEL Apply 4 g topically 4 (four) times daily. (Patient not taking: Reported on 01/08/2024) 100 g 0   DULoxetine (CYMBALTA) 30 MG capsule Take 30 mg by mouth daily. (Patient not taking: Reported on 01/08/2024)     gabapentin  (NEURONTIN ) 100 MG capsule Take 100-200 mg by mouth at bedtime. (Patient not taking: Reported on 01/08/2024)     gabapentin  (NEURONTIN ) 600 MG tablet Take 600 mg by mouth 2 (two) times daily. (Patient not taking: Reported on 01/08/2024)     HYDROcodone -acetaminophen  (NORCO) 10-325 MG tablet Take 1 tablet by mouth every 6 (six) hours as needed. (Patient not taking: Reported on 01/08/2024)     hydrOXYzine (VISTARIL) 25 MG capsule Take 25 mg by mouth 3 (three) times daily. (Patient not taking: Reported on 01/08/2024)     ketorolac  (ACULAR ) 0.5 % ophthalmic solution Place into both eyes daily. (Patient not taking: Reported on 01/08/2024)     latanoprost (XALATAN) 0.005 % ophthalmic solution SMARTSIG:1 Drop(s) In Eye(s) Every Evening (Patient not taking: Reported on 01/08/2024)     meclizine (ANTIVERT) 25 MG tablet Take 25 mg by mouth daily as needed. (Patient not taking: Reported on 01/08/2024)     metoprolol  succinate (TOPROL -XL) 25 MG 24 hr tablet Take 0.5 tablets (12.5 mg total) by mouth daily. 15 tablet 0   omeprazole  (PRILOSEC) 20 MG capsule Take 20 mg by mouth 2 (two) times daily. (Patient not taking: Reported on 01/08/2024)     predniSONE  (STERAPRED UNI-PAK 21 TAB) 10 MG (21) TBPK tablet Take by mouth daily. Take 6 tabs by mouth daily  for 2  days, then 5 tabs for 2 days, then 4 tabs for 2 days, then 3 tabs for 2 days, 2 tabs for 2 days, then 1 tab by mouth daily for 2 days (Patient not taking:  Reported on 01/08/2024) 42 tablet 0   sucralfate  (CARAFATE ) 1 g tablet Take 1 tablet (1 g total) by mouth 4 (four) times daily for 7 days. (Patient not taking: Reported on 01/08/2024) 28 tablet 0   No current facility-administered medications on file prior to visit.    Allergies:   Allergies  Allergen Reactions   Ferumoxytol  Itching and Other (See Comments)    Relieved with IV Benadryl       Chocolate Other (See Comments) and Cough    Sweating, too   Cocoa Other (See Comments) and Cough    Sweating, also   Lisinopril  Other (See Comments) and Cough    Sweating, too   Peanut Oil Cough      OBJECTIVE:  Physical Exam  Vitals:   01/08/24 1425  BP: 120/76  Weight: 124 lb 8 oz (56.5 kg)  Height: 5' 4 (1.626 m)   Body mass index is 21.37 kg/m. No results found.      No data to display           General: well developed, well nourished, seated, in no evident distress Head: head normocephalic and atraumatic.   Neck: supple with no carotid or supraclavicular bruits Cardiovascular: regular rate and rhythm, no murmurs Musculoskeletal: no deformity Skin:  no rash/petichiae Vascular:  Normal pulses all extremities   Neurologic Exam Mental Status: Awake and fully alert.  Fluent speech and language.  Oriented to place and time. Recent and remote memory intact. Attention span, concentration and fund of knowledge appropriate. Mood and affect appropriate.  Cranial Nerves: Fundoscopic exam reveals sharp disc margins. Pupils equal, briskly reactive to light. Extraocular movements full without nystagmus. Visual fields full to confrontation. Hearing intact. Facial sensation intact. Face, tongue, palate moves normally and symmetrically.  Motor: Normal bulk and tone. Normal strength in all tested extremity muscles, pain with exam reported in bilateral shoulders, mild edema of right hand s/p CTR Sensory.: intact to touch , pinprick , position and vibratory sensation.  Coordination: Rapid  alternating movements normal in all extremities. Finger-to-nose and heel-to-shin performed accurately bilaterally. Gait and Station: Arises from chair without difficulty. Stance is normal. Gait demonstrates normal stride length and slight imbalance with Rolator. Unable to Tandem. Reflexes: 1+ and symmetric.    NIHSS  0 Modified Rankin  0    ASSESSMENT: [name] is a 89 y.o. year old female with acute onset slurred speech. Vascular risk factors include Afib, HLD, CHF, HTN.     PLAN:  Stroke:  left insular cortex and frontal lobe small infarcts embolic likely secondary to A-fib missed several doses of Xarelto  : Residual deficit: imbalance. Continue Xarelto  (rivaroxaban ) daily and atorvastatin  40mg  daily for secondary stroke prevention. Discussed secondary stroke prevention measures and importance of close PCP follow up for aggressive stroke risk factor management. I have gone over the pathophysiology of stroke, warning signs and symptoms, risk factors and their management in some detail with instructions to go to the closest emergency room for symptoms of concern. HTN: BP goal <130/90.  Stable on metoprolol  and losartan. Continue to monitor per PCP HLD: LDL goal <70. Recent LDL 77. Continue atorvastatin  40mg  daily per PCP.  DMII: A1c goal<7.0. Recent A1c 5.8. Prediabetes. Continue close follow up with PCP. Recommend well balanced diet low in simple  sugars and carbs.  Atrial fib: rate controlled. Continue Xarelto  and metoprolol  per PCP/cardiology.  CHF: EF 40-45%. Continue close follow up with cardiology.  Imbalance: will refer to PT for evaluation Chronic neck pain: PT referral for evaluation.   Follow up in 6 months or call earlier if needed   CC:  [facility] provider: Dr. Rosemarie PCP: Delores Rojelio Caldron, NP    I spent 45 minutes of face-to-face and non-face-to-face time with patient.  This included previsit chart review including review of recent hospitalization, lab review, study  review, order entry, electronic health record documentation, patient education regarding recent stroke including etiology, secondary stroke prevention measures and importance of managing stroke risk factors, residual deficits and typical recovery time and answered all other questions to patient satisfaction   Greig Forbes, [facility]  [facility] [address] [place], [place] [address]  Phone [phone] Fax [phone] Note: This document was prepared with digital dictation and possible smart phrase technology. Any transcriptional errors that result from this process are unintentional.

## 2024-01-02 NOTE — Patient Instructions (Signed)
 Below is our plan:  Stroke:  left insular cortex and frontal lobe small infarcts embolic likely secondary to A-fib missed several doses of Xarelto  : Residual deficit: imbalance. Continue Xarelto  (rivaroxaban ) daily and atorvastatin  40mg  daily for secondary stroke prevention. Discussed secondary stroke prevention measures and importance of close PCP follow up for aggressive stroke risk factor management. I have gone over the pathophysiology of stroke, warning signs and symptoms, risk factors and their management in some detail with instructions to go to the closest emergency room for symptoms of concern. HTN: BP goal <130/90.  Stable on metoprolol  and losartan. Continue to monitor per PCP HLD: LDL goal <70. Recent LDL 77. Continue atorvastatin  40mg  daily per PCP.  DMII: A1c goal<7.0. Recent A1c 5.8. Prediabetes. Continue close follow up with PCP. Recommend well balanced diet low in simple sugars and carbs.  Atrial fib: rate controlled. Continue Xarelto  and metoprolol  per PCP/cardiology.  CHF: EF 40-45%. Continue close follow up with cardiology.  Imbalance: will refer to PT for evaluation Chronic neck pain: PT referral for evaluation.  Goals:  1) Maintain strict control of hypertension with blood pressure goal below 130/90 2) Maintain good control of diabetes with hemoglobin A1c goal below 7%  3) Maintain good control of lipids with LDL cholesterol goal below 70 mg/dL.  4) Eat a healthy diet with plenty of whole grains, cereals, fruits and vegetables, exercise regularly and maintain ideal body weight   Resources: https://[email or web address].biz/  Please make sure you are staying well hydrated. I recommend 50-60 ounces daily. Well balanced diet and regular exercise encouraged. Consistent sleep schedule with 6-8 hours recommended.   Please continue follow up with care team as directed.   Follow up with me in 6 months   You may receive a  survey regarding today's visit. I encourage you to leave honest feed back as I do use this information to improve patient care. Thank you for seeing me today!

## 2024-01-07 NOTE — Therapy (Signed)
 OUTPATIENT OCCUPATIONAL THERAPY ORTHO EVALUATION  Patient Name: [name] MRN: [id number] DOB:06/19/1933, 89 y.o., female Today's Date: 01/09/2024  PCP: Delores Rojelio Caldron, NP  REFERRING PROVIDER: Arlinda Buster, MD  END OF SESSION:  OT End of Session - 01/09/24 1635     Visit Number 1    Number of Visits 8    Date for Recertification  03/11/24    Authorization Type [employer] Dual complete - covered 100%    OT Start Time 1100    OT Stop Time 1145    OT Time Calculation (min) 45 min    Activity Tolerance Patient tolerated treatment well    Behavior During Therapy WFL for tasks assessed/performed          Past Medical History:  Diagnosis Date   Arthritis    all over   Atrial fibrillation (HCC)    Diagnosed ~2009   Breast cancer, left breast (HCC) 02/07/1996   S/P chemo and mastectomy    CHF (congestive heart failure) (HCC)    GERD (gastroesophageal reflux disease)    Hyperlipidemia    Hypertension    Left shoulder pain    MRI showed pinched nerve - per pt   MI (myocardial infarction) (HCC)    OSA on CPAP    Pneumonia    4-5 times (01/06/2015)   Type II diabetes mellitus (HCC)    Past Surgical History:  Procedure Laterality Date   BIOPSY  07/23/2022   Procedure: BIOPSY;  Surgeon: Kriss Estefana DEL, DO;  Location: [facility];  Service: Gastroenterology;;   BREAST BIOPSY Left 02/07/1996   CARPAL TUNNEL RELEASE Right 2025   CATARACT EXTRACTION W/ INTRAOCULAR LENS  IMPLANT, BILATERAL Bilateral    ESOPHAGOGASTRODUODENOSCOPY N/A 07/23/2022   Procedure: ESOPHAGOGASTRODUODENOSCOPY (EGD);  Surgeon: Kriss Estefana DEL, DO;  Location: [facility];  Service: Gastroenterology;  Laterality: N/A;   JOINT REPLACEMENT     LAPAROSCOPIC CHOLECYSTECTOMY     LEFT HEART CATH AND CORONARY ANGIOGRAPHY N/A 07/07/2020   Procedure: LEFT HEART CATH AND CORONARY ANGIOGRAPHY;  Surgeon: Dann Candyce RAMAN, MD;  Location: [facility];  Service: Cardiovascular;   Laterality: N/A;   MASTECTOMY Left 02/07/1996   TOTAL KNEE ARTHROPLASTY Left 02/07/1999   TOTAL KNEE REVISION Left 01/28/2013   Procedure: LEFT TOTAL KNEE ARTHROPLASTY REVISION;  Surgeon: Kay Ozell Cummins, MD;  Location: [facility];  Service: Orthopedics;  Laterality: Left;   Patient Active Problem List   Diagnosis Date Noted   Acute CVA (cerebrovascular accident) (HCC) 12/05/2023   CHF exacerbation (HCC) 09/12/2020   Hypomagnesemia 09/12/2020   Acute on chronic diastolic CHF (congestive heart failure) (HCC) 09/11/2020   OSA on CPAP    CKD (chronic kidney disease), stage III (HCC)    Anemia    Hypokalemia    Benign essential hypertension 07/15/2020   Malnutrition of moderate degree 07/09/2020   Chest pain of uncertain etiology    Unstable angina (HCC) 07/03/2020   Primary osteoarthritis, right ankle and foot 01/24/2018   Chronic idiopathic constipation 09/28/2017   Arthritis, multiple joint involvement 09/28/2017   Arthritis of right ankle 08/21/2017   Controlled type 2 diabetes mellitus with hyperglycemia, without long-term current use of insulin  (HCC) 08/17/2017   Allergic urticaria 09/07/2016   Generalized pruritus 09/07/2016   Moderate persistent asthma 09/07/2016   Perennial and seasonal allergic rhinitis 09/07/2016   Seasonal allergic rhinitis due to pollen 09/07/2016   History of breast cancer in female 01/12/2016   Chest pain 01/06/2015   Angina at rest  01/06/2015   Diabetes (HCC) 01/06/2015   Persistent atrial fibrillation (HCC) 01/06/2015   HLD (hyperlipidemia) 01/06/2015   Tobacco abuse 01/06/2015   Tobacco dependence syndrome 01/06/2015   Type II diabetes mellitus (HCC)    Painful total knee replacement 01/28/2013   Chronic obstructive asthma (HCC) 06/18/2012   Gastroesophageal reflux disease without esophagitis 09/18/2011   Atrial fibrillation (HCC)    Left shoulder pain    Hypertension     ONSET DATE: 12/19/2023 (referral date)   REFERRING DIAG: G56.03  (ICD-10-CM) - Bilateral carpal tunnel syndrome  Needs OT 2 weeks s/p right CTR 12/27/23 NO SPLINT  THERAPY DIAG:  Muscle weakness (generalized)  Other lack of coordination  Other disturbances of skin sensation  Pain in right hand  Rationale for Evaluation and Treatment: Rehabilitation  SUBJECTIVE:   SUBJECTIVE STATEMENT: It still feels numb (Rt hand) - stitches removed today Pt accompanied by: SON  PERTINENT HISTORY: A-fib, CHF, HLD, HTN, MI, pre-diabetes, mild stroke, DDD cervical spine  PRECAUTIONS: Other: per protocol    WEIGHT BEARING RESTRICTIONS: No  PAIN:  Are you having pain? No  FALLS: Has patient fallen in last 6 months? Yes. Number of falls 1  LIVING ENVIRONMENT: Lives with: lives with their family and daughter Lives in: 2 story house, flight of steps up to bedroom but has stair lift, threshold to enter Has following equipment at home: Quad cane small base, shower chair, and rollator  PLOF: needed assist for ADLs due to hands and back   PATIENT GOALS: get my hand better  NEXT MD VISIT: 02/06/24  OBJECTIVE:  Note: Objective measures were completed at Evaluation unless otherwise noted.  HAND DOMINANCE: Right  ADLs: Overall ADLs: Max assist for BADLS - has aide M-F (3 hrs/day)   FUNCTIONAL OUTCOME MEASURES: Quick Dash: TBA  UPPER EXTREMITY ROM:   BUE AROM limited in shoulders to approx 70-75% flexion, ER less than 50%, BUE elbows, FA's WFLs,  75% wrist movement, Lt hand full composite flexion, Rt approx 90% limited d/t CTR and arthritis. Pt has arthritis MP joints both hands more radially (index and long fingers) but worse Rt hand. Rt thumb can only oppose to index finger    UPPER EXTREMITY MMT:   not tested    HAND FUNCTION: Grip strength: Right: TBA at 4 weeks post-op; Left: 13 lbs  COORDINATION: 9 Hole Peg test: Right: 60 sec; Left: 42 sec  SENSATION: Pt reports numbness remaining first 3 digits  EDEMA: moderate Rt  hand  COGNITION: Overall cognitive status: Within functional limits for tasks assessed  OBSERVATIONS: Pt uses rollator, OA in hands (Rt worse), DDD cervical spine causing additional deficits   TREATMENT DATE: 01/09/24                                                                                                                             Pt issued tan foam for writing and red foam for eating utensils and encouraged to resume eating  with Rt hand as pt has been using Lt non dominant hand. Practiced writing name with greater success using built up pen  Pt issued CTR HEP and reviewed - see pt instructions Pt issued scar massage education and demo - pt instructed to avoid 1 small open area but can perform everywhere else  Reviewed precautions - no strengthening (lifting, pushing, pulling, gripping) until 4 weeks post-op. Pt instructed she can hold rollator Rt hand for safety and fall prevention, but just avoid too tight of grip Rt hand. Pt can firmly hold w/ Lt hand   PATIENT EDUCATION: Education details: see above Person educated: Patient and Spouse Education method: Explanation, Demonstration, Verbal cues, and Handouts Education comprehension: verbalized understanding, returned demonstration, verbal cues required, and needs further education  HOME EXERCISE PROGRAM: 01/09/24: CTR HEP program, scar massage info  GOALS: Goals reviewed with patient? Yes    SHORT TERM GOALS: Target date: 02/09/24   Patient will demonstrate initial RUE HEP with 25% verbal cues or less for proper execution. Baseline: New to outpt OT Goal status: IN Progress -     2.  Pt will independently recall at least 3 joint protection, ergonomics, and body mechanic principles as noted in pt instructions to assist with daily tasks with increased comfort and confidence.  Baseline:  New to outpt OT Goal status: INITIAL   3.  Pt to perform 90% composite flexion Rt hand Baseline:  Goal status: INITIAL  4.  Pt to  demo thumb opposition to long finger Rt hand Baseline:  Goal status: INITIAL      LONG TERM GOALS: Target date: 03/11/24   Patient will demonstrate updated RUE HEP with visual instruction only for proper execution. Baseline: New to outpt OT Goal status: INITIAL    2.  Pt will improve coordination as evidenced by performing 9 hole peg test in 45 sec or less Rt hand Baseline:  60 sec Goal status: INITIAL   3.  Pt will demo 50% or more grip strength Rt hand compared to Lt hand Baseline: unable to assess d/t current precautions Goal status: INITIAL   4.  Quick Dash goal TBD Baseline: QuickDash Goal status: INITIAL   ASSESSMENT:  CLINICAL IMPRESSION: Patient is a 89 y.o. female who was seen today for occupational therapy evaluation for bilateral CTS s/p Rt CTR 12/27/23. Hx includes A-fib, CHF, HLD, HTN, MI, pre-diabetes, mild stroke, OA, DDD cervical spine. Patient currently presents below baseline level of functioning demonstrating functional deficits and impairments as noted below. Pt would benefit from skilled OT services in the outpatient setting to work on impairments as noted below to help pt return to PLOF as able.   SABRA   PERFORMANCE DEFICITS: in functional skills including ADLs, IADLs, coordination, sensation, edema, ROM, strength, pain, Fine motor control, Gross motor control, balance, body mechanics, decreased knowledge of precautions, decreased knowledge of use of DME, and UE functional use,.   IMPAIRMENTS: are limiting patient from ADLs, leisure, and social participation.   COMORBIDITIES: may have co-morbidities  that affects occupational performance. Patient will benefit from skilled OT to address above impairments and improve overall function.  MODIFICATION OR ASSISTANCE TO COMPLETE EVALUATION: No modification of tasks or assist necessary to complete an evaluation.  OT OCCUPATIONAL PROFILE AND HISTORY: Detailed assessment: Review of records and additional review of  physical, cognitive, psychosocial history related to current functional performance.  CLINICAL DECISION MAKING: Moderate - several treatment options, min-mod task modification necessary  REHAB POTENTIAL: Good  EVALUATION COMPLEXITY: Moderate  PLAN:  OT FREQUENCY: 1x/week  OT DURATION: 8 weeks  PLANNED INTERVENTIONS: 97535 self care/ADL training, 02889 therapeutic exercise, 97530 therapeutic activity, 97140 manual therapy, 97035 ultrasound, 97018 paraffin, 02960 fluidotherapy, 97010 moist heat, 97010 cryotherapy, 97034 contrast bath, 97760 Orthotic Initial, 97763 Orthotic/Prosthetic subsequent, functional mobility training, energy conservation, patient/family education, and DME and/or AE instructions  RECOMMENDED OTHER SERVICES: none  CONSULTED AND AGREED WITH PLAN OF CARE: Patient and family member/caregiver  PLAN FOR NEXT SESSION: Review HEP, review scar massage, assess Quick Dash, pulsed US , IASTM prn   Burnard JINNY Roads, OT 01/09/2024, 4:37 PM

## 2024-01-08 ENCOUNTER — Encounter: Payer: Self-pay | Admitting: Family Medicine

## 2024-01-08 ENCOUNTER — Ambulatory Visit: Admitting: Family Medicine

## 2024-01-08 VITALS — BP 120/76 | Ht 64.0 in | Wt 124.5 lb

## 2024-01-08 DIAGNOSIS — I63512 Cerebral infarction due to unspecified occlusion or stenosis of left middle cerebral artery: Secondary | ICD-10-CM | POA: Diagnosis not present

## 2024-01-08 DIAGNOSIS — R2689 Other abnormalities of gait and mobility: Secondary | ICD-10-CM | POA: Diagnosis not present

## 2024-01-08 DIAGNOSIS — M4802 Spinal stenosis, cervical region: Secondary | ICD-10-CM

## 2024-01-08 DIAGNOSIS — G8929 Other chronic pain: Secondary | ICD-10-CM

## 2024-01-08 DIAGNOSIS — M542 Cervicalgia: Secondary | ICD-10-CM | POA: Diagnosis not present

## 2024-01-08 NOTE — Progress Notes (Unsigned)
   [name] - 89 y.o. female MRN [id number]  Date of birth: [birth year]  Office Visit Note: Visit Date: 01/09/2024 PCP: Delores Rojelio Caldron, NP Referred by: Delores Rojelio Caldron, NP  Subjective:  HPI: [name] is a 89 y.o. female who presents today for follow up 2 weeks status post right wrist open carpal tunnel release.  Pertinent ROS were reviewed with the patient and found to be negative unless otherwise specified above in HPI.   Assessment & Plan: Visit Diagnoses: No diagnosis found.  Plan: ***  Follow-up: No follow-ups on file.   Meds & Orders: No orders of the defined types were placed in this encounter.  No orders of the defined types were placed in this encounter.    Procedures: No procedures performed       Objective:   Vital Signs: There were no vitals taken for this visit.  Ortho Exam ***  Imaging: No results found.   Coni Homesley Afton Alderton, M.D. [facility], Hand Surgery

## 2024-01-09 ENCOUNTER — Ambulatory Visit: Attending: Orthopedic Surgery | Admitting: Occupational Therapy

## 2024-01-09 ENCOUNTER — Ambulatory Visit: Admitting: Orthopedic Surgery

## 2024-01-09 ENCOUNTER — Other Ambulatory Visit: Payer: Self-pay | Admitting: Orthopedic Surgery

## 2024-01-09 DIAGNOSIS — M79641 Pain in right hand: Secondary | ICD-10-CM | POA: Insufficient documentation

## 2024-01-09 DIAGNOSIS — R208 Other disturbances of skin sensation: Secondary | ICD-10-CM | POA: Diagnosis present

## 2024-01-09 DIAGNOSIS — Z9889 Other specified postprocedural states: Secondary | ICD-10-CM

## 2024-01-09 DIAGNOSIS — R278 Other lack of coordination: Secondary | ICD-10-CM | POA: Insufficient documentation

## 2024-01-09 DIAGNOSIS — R29898 Other symptoms and signs involving the musculoskeletal system: Secondary | ICD-10-CM | POA: Diagnosis present

## 2024-01-09 DIAGNOSIS — L905 Scar conditions and fibrosis of skin: Secondary | ICD-10-CM | POA: Diagnosis present

## 2024-01-09 DIAGNOSIS — M6281 Muscle weakness (generalized): Secondary | ICD-10-CM | POA: Diagnosis present

## 2024-01-09 DIAGNOSIS — G5603 Carpal tunnel syndrome, bilateral upper limbs: Secondary | ICD-10-CM | POA: Diagnosis not present

## 2024-01-09 MED ORDER — ACETAMINOPHEN-CODEINE 300-30 MG PO TABS: 1.0000 | ORAL_TABLET | Freq: Four times a day (QID) | ORAL | 0 refills | Status: AC | PRN

## 2024-01-09 NOTE — Patient Instructions (Addendum)
Scar Massage Purpose: To soften/smooth scar tissue.   To desensitize sensitive areas after surgery.   To mechanically break up inner scar tissue, adhesions, therefore allowing freer        movement of injured tendons and muscle.  Technique: Use a cream to massage with, as it insures a smooth gliding motion and avoids irritation  caused by rubbing skin to skin.  Cream is preferred over a lotion.   May begin as soon as any suture areas are healed.   Apply a firm, steady pressure with your finger-tip, pulling the skin in a circular motion over the scarred area.  Do not rub.  Message 5 minutes, at least 2 times a day, unless you are getting tender afterwards

## 2024-01-18 ENCOUNTER — Ambulatory Visit: Attending: Internal Medicine | Admitting: Cardiovascular Disease

## 2024-01-18 ENCOUNTER — Encounter: Payer: Self-pay | Admitting: Cardiovascular Disease

## 2024-01-18 VITALS — BP 106/64 | HR 73 | Ht 64.0 in | Wt 127.0 lb

## 2024-01-18 DIAGNOSIS — I4819 Other persistent atrial fibrillation: Secondary | ICD-10-CM

## 2024-01-18 DIAGNOSIS — I361 Nonrheumatic tricuspid (valve) insufficiency: Secondary | ICD-10-CM

## 2024-01-18 DIAGNOSIS — I5020 Unspecified systolic (congestive) heart failure: Secondary | ICD-10-CM

## 2024-01-18 DIAGNOSIS — I071 Rheumatic tricuspid insufficiency: Secondary | ICD-10-CM

## 2024-01-18 MED ORDER — METOPROLOL SUCCINATE ER 25 MG PO TB24: 25.0000 mg | ORAL_TABLET | Freq: Every day | ORAL | 3 refills | Status: AC

## 2024-01-18 NOTE — Progress Notes (Signed)
 Cardiology Office Note:    Date:  01/18/2024   ID:  [name], DOB 12/15/1933, MRN [id number]  PCP:  Delores Rojelio Caldron, NP   [facility] Providers Cardiologist:  Ozell Fell, MD     Referring MD: Delores Rojelio Caldron, NP   Chief Complaint  Patient presents with   Atrial Fibrillation    History of Present Illness:    [name] is a 89 y.o. female with a hx of chronic HFpEF, permanent atrial fibrillation, nonobstructive CAD, and severe tricuspid regurgitation.  When I saw her last year, we noted that her tricuspid regurgitation was torrential.  After discussing treatment options with her, she expressed that she would not be interested in pursuing any surgical therapy but would be open to transcatheter treatment if needed.  She was hospitalized in October of this year with an acute stroke after presenting with slurred speech.  She had been off of her rivaroxaban  for a few days.  MRI of the brain showed a small acute infarct in the left insula.  Echocardiogram at that time showed LVEF 40 to 45%, moderate RV dysfunction, mild MR, and moderate TR.  The patient is here with her son today.  She states that she missed her rivaroxaban  for at least 3 to 4 days prior to her stroke.  She denies any chest pain, chest pressure, or shortness of breath.  She is having a lot of problems with cervical spine disease and pain associated to that.  She and her son have some questions about her surgical risk today.  The patient denies heart palpitations, lightheadedness, or presyncope.  She has no leg swelling.   Current Medications: Active Medications[1]   Allergies:   Ferumoxytol , Chocolate, Cocoa, Lisinopril , and Peanut oil   ROS:   Please see the history of present illness.    All other systems reviewed and are negative.  EKGs/Labs/Other Studies Reviewed:    The following studies were reviewed today: Cardiac Studies & Procedures    ______________________________________________________________________________________________ CARDIAC CATHETERIZATION  CARDIAC CATHETERIZATION 07/07/2020  Conclusion  Mid RCA lesion is 25% stenosed.  LV end diastolic pressure is normal.  There is no aortic valve stenosis.  Nonobstructive CAD.  Continue medical therapy.  Findings Coronary Findings Diagnostic  Dominance: Right  Left Anterior Descending The vessel exhibits minimal luminal irregularities.  Left Circumflex The vessel exhibits minimal luminal irregularities.  Right Coronary Artery Mid RCA lesion is 25% stenosed.  Intervention  No interventions have been documented.   STRESS TESTS  NM MYOCAR MULTI W/SPECT W 01/07/2015  Narrative  There was no ST segment deviation noted during stress.  No T wave inversion was noted during stress.  Defect 1: There is a medium defect of moderate severity.  Findings consistent with prior myocardial infarction.  Nuclear stress EF: 40%.  Moderate size and intensity fixed basal to mid-septal defect suggestive of scar. No significant reversible ischemia. LVEF 40% with septal akinesis. This is an intermediate risk study.   ECHOCARDIOGRAM  ECHOCARDIOGRAM COMPLETE 12/06/2023  Narrative ECHOCARDIOGRAM REPORT    Patient Name:   [name] Date of Exam: 12/06/2023 Medical Rec #:  [id number]          Height:       64.0 in Accession #:    [id number]         Weight:       134.0 lb Date of Birth:  07/25/1933          BSA:  1.650 m Patient Age:    89 years           BP:           114/88 mmHg Patient Gender: F                  HR:           97 bpm. Exam Location:  Inpatient  Procedure: 2D Echo, Cardiac Doppler and Color Doppler (Both Spectral and Color Flow Doppler were utilized during procedure).  Indications:    Stroke I63.9  History:        Patient has prior history of Echocardiogram examinations, most recent 11/16/2022. CHF, Angina, Stroke and CKD,  stage 3, Arrythmias:Atrial Fibrillation, Signs/Symptoms:Chest Pain; Risk Factors:Hypertension, Sleep Apnea, Diabetes, Dyslipidemia and Current Smoker.  Sonographer:    Thea Norlander RCS Referring Phys: FRANKY HALEY, N  IMPRESSIONS   1. Left ventricular ejection fraction, by estimation, is 40 to 45%. The left ventricle has mildly decreased function. Left ventricular endocardial border not optimally defined to evaluate regional wall motion. Left ventricular diastolic parameters are indeterminate. 2. Right ventricular systolic function is moderately reduced. The right ventricular size is normal. There is severely elevated pulmonary artery systolic pressure. 3. Right atrial size was severely dilated. 4. The mitral valve is normal in structure. Mild mitral valve regurgitation. No evidence of mitral stenosis. 5. Tricuspid valve regurgitation is moderate. 6. The aortic valve is tricuspid. Aortic valve regurgitation is trivial. Aortic valve sclerosis/calcification is present, without any evidence of aortic stenosis. Aortic valve Vmax measures 1.24 m/s. 7. Aortic dilatation noted. There is mild dilatation of the ascending aorta, measuring 38 mm. 8. The inferior vena cava is normal in size with greater than 50% respiratory variability, suggesting right atrial pressure of 3 mmHg.  FINDINGS Left Ventricle: Left ventricular ejection fraction, by estimation, is 40 to 45%. The left ventricle has mildly decreased function. Left ventricular endocardial border not optimally defined to evaluate regional wall motion. The left ventricular internal cavity size was normal in size. There is no left ventricular hypertrophy. Left ventricular diastolic function could not be evaluated due to atrial fibrillation. Left ventricular diastolic parameters are indeterminate. Normal left ventricular filling pressure.  Right Ventricle: The right ventricular size is normal. No increase in right ventricular wall  thickness. Right ventricular systolic function is moderately reduced. There is severely elevated pulmonary artery systolic pressure. The tricuspid regurgitant velocity is 285.00 m/s, and with an assumed right atrial pressure of 3 mmHg, the estimated right ventricular systolic pressure is 324903.0 mmHg.  Left Atrium: Left atrial size was normal in size.  Right Atrium: Right atrial size was severely dilated.  Pericardium: There is no evidence of pericardial effusion.  Mitral Valve: The mitral valve is normal in structure. Mild to moderate mitral annular calcification. Mild mitral valve regurgitation. No evidence of mitral valve stenosis.  Tricuspid Valve: The tricuspid valve is normal in structure. Tricuspid valve regurgitation is moderate . No evidence of tricuspid stenosis.  Aortic Valve: The aortic valve is tricuspid. Aortic valve regurgitation is trivial. Aortic valve sclerosis/calcification is present, without any evidence of aortic stenosis. Aortic valve peak gradient measures 6.2 mmHg.  Pulmonic Valve: The pulmonic valve was normal in structure. Pulmonic valve regurgitation is mild. No evidence of pulmonic stenosis.  Aorta: Aortic dilatation noted. There is mild dilatation of the ascending aorta, measuring 38 mm.  Venous: The inferior vena cava is normal in size with greater than 50% respiratory variability, suggesting right atrial pressure of 3 mmHg.  IAS/Shunts: No atrial level shunt detected by color flow Doppler.   LEFT VENTRICLE PLAX 2D LVIDd:         3.50 cm   Diastology LVIDs:         2.60 cm   LV e' medial:    9.25 cm/s LV PW:         0.80 cm   LV E/e' medial:  8.2 LV IVS:        0.80 cm   LV e' lateral:   12.40 cm/s LVOT diam:     1.90 cm   LV E/e' lateral: 6.1 LV SV:         29 LV SV Index:   18 LVOT Area:     2.84 cm   RIGHT VENTRICLE             IVC RV S prime:     14.90 cm/s  IVC diam: 1.40 cm TAPSE (M-mode): 1.3 cm  LEFT ATRIUM             Index         RIGHT ATRIUM           Index LA diam:        2.80 cm 1.70 cm/m   RA Area:     25.80 cm LA Vol (A2C):   45.5 ml 27.57 ml/m  RA Volume:   75.60 ml  45.81 ml/m LA Vol (A4C):   33.5 ml 20.30 ml/m LA Biplane Vol: 39.0 ml 23.63 ml/m AORTIC VALVE AV Area (Vmax): 1.68 cm AV Vmax:        124.00 cm/s AV Peak Grad:   6.2 mmHg LVOT Vmax:      73.56 cm/s LVOT Vmean:     46.840 cm/s LVOT VTI:       0.103 m  AORTA Ao Root diam: 3.00 cm Ao Asc diam:  3.80 cm  MITRAL VALVE               TRICUSPID VALVE MV Area (PHT): 3.53 cm    TR Peak grad:   324900.0 mmHg MV Decel Time: 215 msec    TR Vmax:        28500.00 cm/s MV E velocity: 76.00 cm/s MV A velocity: 26.70 cm/s  SHUNTS MV E/A ratio:  2.85        Systemic VTI:  0.10 m Systemic Diam: 1.90 cm  Wilbert Bihari MD Electronically signed by Wilbert Bihari MD Signature Date/Time: 12/06/2023/1:08:49 PM    Final          ______________________________________________________________________________________________      EKG:        Recent Labs: 10/26/2023: Pro Brain Natriuretic Peptide 1,335.0 12/06/2023: ALT 7; BUN 9; Creatinine, Ser 0.77; Hemoglobin 10.3; Platelets 393; Potassium 3.7; Sodium 134  Recent Lipid Panel    Component Value Date/Time   CHOL 137 12/06/2023 0157   TRIG 59 12/06/2023 0157   HDL 48 12/06/2023 0157   CHOLHDL 2.9 12/06/2023 0157   VLDL 12 12/06/2023 0157   LDLCALC 77 12/06/2023 0157   LDLDIRECT 46 12/22/2020 1549     Risk Assessment/Calculations:    CHA2DS2-VASc Score = 7   This indicates a 11.2% annual risk of stroke. The patient's score is based upon: CHF History: 1 HTN History: 1 Diabetes History: 0 Stroke History: 2 Vascular Disease History: 0 Age Score: 2 Gender Score: 1               Physical Exam:    VS:  BP  106/64 (BP Location: Left Arm, Patient Position: Sitting, Cuff Size: Normal)   Pulse 73   Ht 5' 4 (1.626 m)   Wt 127 lb (57.6 kg)   SpO2 97%   BMI 21.80 kg/m     Wt  Readings from Last 3 Encounters:  01/18/24 127 lb (57.6 kg)  01/08/24 124 lb 8 oz (56.5 kg)  12/31/23 134 lb (60.8 kg)     GEN: Elderly woman in no acute distress HEENT: Normal NECK: No JVD; No carotid bruits LYMPHATICS: No lymphadenopathy CARDIAC: Irregularly irregular, no murmurs, rubs, gallops RESPIRATORY:  Clear to auscultation without rales, wheezing or rhonchi  ABDOMEN: Soft, non-tender, non-distended MUSCULOSKELETAL:  No edema; No deformity  SKIN: Warm and dry NEUROLOGIC:  Alert and oriented x 3 PSYCHIATRIC:  Normal affect   Assessment & Plan Persistent atrial fibrillation (HCC) Increase metoprolol  succinate to 25 mg daily.  Continue rivaroxaban .  Emphasized the importance of medication compliance especially with rivaroxaban  after she has suffered a stroke. Severe tricuspid regurgitation Interestingly, her tricuspid regurgitation appears less severe on most recent echo.  She does not have a prominent murmur on exam.  Continue medical therapy. HFrEF (heart failure with reduced ejection fraction) (HCC) Treated with losartan and metoprolol  succinate.  Her advanced age with relatively low blood pressure, I do not think she would tolerate Entresto or aggressive GDMT.  LVEF 40 to 45% on most recent echo.  No evidence of volume overload on her exam.            Medication Adjustments/Labs and Tests Ordered: Current medicines are reviewed at length with the patient today.  Concerns regarding medicines are outlined above.  No orders of the defined types were placed in this encounter.  Meds ordered this encounter  Medications   metoprolol  succinate (TOPROL  XL) 25 MG 24 hr tablet    Sig: Take 1 tablet (25 mg total) by mouth daily.    Dispense:  90 tablet    Refill:  3    Patient Instructions  Medication Instructions:  Increase Toprol  XL 25 mg once daily *If you need a refill on your cardiac medications before your next appointment, please call your pharmacy*  Lab  Work: NONE If you have labs (blood work) drawn today and your tests are completely normal, you will receive your results only by: MyChart Message (if you have MyChart) OR A paper copy in the mail If you have any lab test that is abnormal or we need to change your treatment, we will call you to review the results.  Testing/Procedures: NONE  Follow-Up: At [facility], you and your health needs are our priority.  As part of our continuing mission to provide you with exceptional heart care, our providers are all part of one team.  This team includes your primary Cardiologist (physician) and Advanced Practice Providers or APPs (Physician Assistants and Nurse Practitioners) who all work together to provide you with the care you need, when you need it.  Your next appointment:   6 months APP 1 year Wonda, MD   We recommend signing up for the patient portal called MyChart.  Sign up information is provided on this After Visit Summary.  MyChart is used to connect with patients for Virtual Visits (Telemedicine).  Patients are able to view lab/test results, encounter notes, upcoming appointments, etc.  Non-urgent messages can be sent to your provider as well.   To learn more about what you can do with MyChart, go to [email or web address].  Signed, Ozell Fell, MD  01/18/2024 3:56 PM    [facility]     [1]  Current Meds  Medication Sig   acetaminophen -codeine  (TYLENOL  #3) 300-30 MG tablet Take 1 tablet by mouth every 6 (six) hours as needed.   albuterol  (VENTOLIN  HFA) 108 (90 Base) MCG/ACT inhaler Inhale 2 puffs into the lungs every 4 (four) hours as needed for wheezing or shortness of breath.   alendronate (FOSAMAX) 70 MG tablet Take 70 mg by mouth once a week.   atorvastatin  (LIPITOR) 40 MG tablet TAKE 1 TABLET BY MOUTH EVERY DAY   Cholecalciferol  (VITAMIN D ) 2000 units tablet Take 2,000 Units by mouth daily.   diclofenac  Sodium (VOLTAREN ) 1 % GEL Apply 4 g  topically 4 (four) times daily.   dorzolamide (TRUSOPT) 2 % ophthalmic solution Place 1 drop into both eyes 2 (two) times daily.   DULoxetine (CYMBALTA) 30 MG capsule Take 30 mg by mouth daily.   FLOVENT  HFA 110 MCG/ACT inhaler TAKE 2 PUFFS BY MOUTH TWICE A DAY   fluticasone  (FLONASE ) 50 MCG/ACT nasal spray Place 1 spray into both nostrils as needed.   furosemide  (LASIX ) 40 MG tablet Take 1 tablet (40 mg total) by mouth daily.   gabapentin  (NEURONTIN ) 100 MG capsule Take 100-200 mg by mouth at bedtime.   HYDROcodone -acetaminophen  (NORCO) 10-325 MG tablet Take 1 tablet by mouth every 6 (six) hours as needed.   hydrocortisone  (ANUSOL -HC) 2.5 % rectal cream Place 1 Application rectally 2 (two) times daily.   hydrOXYzine (VISTARIL) 25 MG capsule Take 25 mg by mouth 3 (three) times daily.   ketorolac  (ACULAR ) 0.5 % ophthalmic solution Place into both eyes daily.   lidocaine  (LIDODERM ) 5 % Place 1 patch onto the skin daily. Remove & Discard patch within 12 hours or as directed by MD   losartan (COZAAR) 25 MG tablet Take 25 mg by mouth daily.   meclizine (ANTIVERT) 25 MG tablet Take 25 mg by mouth daily as needed.   metoprolol  succinate (TOPROL  XL) 25 MG 24 hr tablet Take 1 tablet (25 mg total) by mouth daily.   omeprazole  (PRILOSEC) 20 MG capsule Take 20 mg by mouth 2 (two) times daily.   pantoprazole  (PROTONIX ) 40 MG tablet Take 1 tablet (40 mg total) by mouth daily.   Rivaroxaban  (XARELTO ) 15 MG TABS tablet Take 1 tablet (15 mg total) by mouth daily with supper.   [DISCONTINUED] metoprolol  succinate (TOPROL -XL) 25 MG 24 hr tablet Take 0.5 tablets (12.5 mg total) by mouth daily.

## 2024-01-18 NOTE — Assessment & Plan Note (Addendum)
 Increase metoprolol  succinate to 25 mg daily.  Continue rivaroxaban .  Emphasized the importance of medication compliance especially with rivaroxaban  after she has suffered a stroke.

## 2024-01-18 NOTE — Patient Instructions (Signed)
 Medication Instructions:  Increase Toprol  XL 25 mg once daily *If you need a refill on your cardiac medications before your next appointment, please call your pharmacy*  Lab Work: NONE If you have labs (blood work) drawn today and your tests are completely normal, you will receive your results only by: MyChart Message (if you have MyChart) OR A paper copy in the mail If you have any lab test that is abnormal or we need to change your treatment, we will call you to review the results.  Testing/Procedures: NONE  Follow-Up: At [facility], you and your health needs are our priority.  As part of our continuing mission to provide you with exceptional heart care, our providers are all part of one team.  This team includes your primary Cardiologist (physician) and Advanced Practice Providers or APPs (Physician Assistants and Nurse Practitioners) who all work together to provide you with the care you need, when you need it.  Your next appointment:   6 months APP 1 year Wonda, MD   We recommend signing up for the patient portal called MyChart.  Sign up information is provided on this After Visit Summary.  MyChart is used to connect with patients for Virtual Visits (Telemedicine).  Patients are able to view lab/test results, encounter notes, upcoming appointments, etc.  Non-urgent messages can be sent to your provider as well.   To learn more about what you can do with MyChart, go to [email or web address].

## 2024-01-23 ENCOUNTER — Ambulatory Visit: Admitting: Occupational Therapy

## 2024-01-23 ENCOUNTER — Encounter: Payer: Self-pay | Admitting: Occupational Therapy

## 2024-01-23 DIAGNOSIS — R278 Other lack of coordination: Secondary | ICD-10-CM

## 2024-01-23 DIAGNOSIS — M6281 Muscle weakness (generalized): Secondary | ICD-10-CM

## 2024-01-23 DIAGNOSIS — R208 Other disturbances of skin sensation: Secondary | ICD-10-CM

## 2024-01-23 DIAGNOSIS — M79641 Pain in right hand: Secondary | ICD-10-CM

## 2024-01-23 NOTE — Therapy (Signed)
 OUTPATIENT OCCUPATIONAL THERAPY ORTHO TREATMENT  Patient Name: [name] MRN: [id number] DOB:[birth year], 89 y.o., female Today's Date: 01/23/2024  PCP: Delores Rojelio Caldron, NP  REFERRING PROVIDER: Arlinda Buster, MD  END OF SESSION:  OT End of Session - 01/23/24 1454     Visit Number 2    Number of Visits 8    Date for Recertification  03/11/24    Authorization Type [employer] Dual complete - covered 100%    OT Start Time 1450    OT Stop Time 1530    OT Time Calculation (min) 40 min    Activity Tolerance Patient tolerated treatment well    Behavior During Therapy WFL for tasks assessed/performed          Past Medical History:  Diagnosis Date   Arthritis    all over   Atrial fibrillation (HCC)    Diagnosed ~2009   Breast cancer, left breast (HCC) 02/07/1996   S/P chemo and mastectomy    CHF (congestive heart failure) (HCC)    GERD (gastroesophageal reflux disease)    Hyperlipidemia    Hypertension    Left shoulder pain    MRI showed pinched nerve - per pt   MI (myocardial infarction) (HCC)    OSA on CPAP    Pneumonia    4-5 times (01/06/2015)   Type II diabetes mellitus (HCC)    Past Surgical History:  Procedure Laterality Date   BIOPSY  07/23/2022   Procedure: BIOPSY;  Surgeon: Kriss Estefana DEL, DO;  Location: [facility];  Service: Gastroenterology;;   BREAST BIOPSY Left 02/07/1996   CARPAL TUNNEL RELEASE Right 2025   CATARACT EXTRACTION W/ INTRAOCULAR LENS  IMPLANT, BILATERAL Bilateral    ESOPHAGOGASTRODUODENOSCOPY N/A 07/23/2022   Procedure: ESOPHAGOGASTRODUODENOSCOPY (EGD);  Surgeon: Kriss Estefana DEL, DO;  Location: [facility];  Service: Gastroenterology;  Laterality: N/A;   JOINT REPLACEMENT     LAPAROSCOPIC CHOLECYSTECTOMY     LEFT HEART CATH AND CORONARY ANGIOGRAPHY N/A 07/07/2020   Procedure: LEFT HEART CATH AND CORONARY ANGIOGRAPHY;  Surgeon: Dann Candyce RAMAN, MD;  Location: [facility] INVASIVE CV LAB;  Service: Cardiovascular;   Laterality: N/A;   MASTECTOMY Left 02/07/1996   TOTAL KNEE ARTHROPLASTY Left 02/07/1999   TOTAL KNEE REVISION Left 01/28/2013   Procedure: LEFT TOTAL KNEE ARTHROPLASTY REVISION;  Surgeon: Kay Ozell Cummins, MD;  Location: [facility] OR;  Service: Orthopedics;  Laterality: Left;   Patient Active Problem List   Diagnosis Date Noted   Acute CVA (cerebrovascular accident) (HCC) 12/05/2023   CHF exacerbation (HCC) 09/12/2020   Hypomagnesemia 09/12/2020   Acute on chronic diastolic CHF (congestive heart failure) (HCC) 09/11/2020   OSA on CPAP    CKD (chronic kidney disease), stage III (HCC)    Anemia    Hypokalemia    Benign essential hypertension 07/15/2020   Malnutrition of moderate degree 07/09/2020   Chest pain of uncertain etiology    Unstable angina (HCC) 07/03/2020   Primary osteoarthritis, right ankle and foot 01/24/2018   Chronic idiopathic constipation 09/28/2017   Arthritis, multiple joint involvement 09/28/2017   Arthritis of right ankle 08/21/2017   Controlled type 2 diabetes mellitus with hyperglycemia, without long-term current use of insulin  (HCC) 08/17/2017   Allergic urticaria 09/07/2016   Generalized pruritus 09/07/2016   Moderate persistent asthma 09/07/2016   Perennial and seasonal allergic rhinitis 09/07/2016   Seasonal allergic rhinitis due to pollen 09/07/2016   History of breast cancer in female 01/12/2016   Chest pain 01/06/2015   Angina at rest  01/06/2015   Diabetes (HCC) 01/06/2015   Persistent atrial fibrillation (HCC) 01/06/2015   HLD (hyperlipidemia) 01/06/2015   Tobacco abuse 01/06/2015   Tobacco dependence syndrome 01/06/2015   Type II diabetes mellitus (HCC)    Painful total knee replacement 01/28/2013   Chronic obstructive asthma (HCC) 06/18/2012   Gastroesophageal reflux disease without esophagitis 09/18/2011   Atrial fibrillation (HCC)    Left shoulder pain    Hypertension     ONSET DATE: 12/19/2023 (referral date)   REFERRING DIAG: G56.03  (ICD-10-CM) - Bilateral carpal tunnel syndrome  Needs OT 2 weeks s/p right CTR 12/27/23 NO SPLINT  THERAPY DIAG:  Other disturbances of skin sensation  Pain in right hand  Other lack of coordination  Muscle weakness (generalized)  Rationale for Evaluation and Treatment: Rehabilitation  SUBJECTIVE:   SUBJECTIVE STATEMENT: It still feels numb (Rt hand)  Pt accompanied by: home health aide  PERTINENT HISTORY: A-fib, CHF, HLD, HTN, MI, pre-diabetes, mild stroke, DDD cervical spine  PRECAUTIONS: Other: per protocol    WEIGHT BEARING RESTRICTIONS: No  PAIN:  Are you having pain? 4/10 today in fingertips (thumb, index, and long finger) but up to 7/10  FALLS: Has patient fallen in last 6 months? Yes. Number of falls 1  LIVING ENVIRONMENT: Lives with: lives with their family and daughter Lives in: 2 story house, flight of steps up to bedroom but has stair lift, threshold to enter Has following equipment at home: Quad cane small base, shower chair, and rollator  PLOF: needed assist for ADLs due to hands and back   PATIENT GOALS: get my hand better  NEXT MD VISIT: 02/06/24  OBJECTIVE:  Note: Objective measures were completed at Evaluation unless otherwise noted.  HAND DOMINANCE: Right  ADLs: Overall ADLs: Max assist for BADLS - has aide M-F (3 hrs/day)   FUNCTIONAL OUTCOME MEASURES: Quick Dash: TBA 01/23/24: Quick Dash = 73% deficit RUE  UPPER EXTREMITY ROM:   BUE AROM limited in shoulders to approx 70-75% flexion, ER less than 50%, BUE elbows, FA's WFLs,  75% wrist movement, Lt hand full composite flexion, Rt approx 90% limited d/t CTR and arthritis. Pt has arthritis MP joints both hands more radially (index and long fingers) but worse Rt hand. Rt thumb can only oppose to index finger    UPPER EXTREMITY MMT:   not tested    HAND FUNCTION: Grip strength: Right: TBA at 4 weeks post-op; Left: 13 lbs  COORDINATION: 9 Hole Peg test: Right: 60 sec; Left: 42  sec  SENSATION: Pt reports numbness remaining first 3 digits  EDEMA: moderate Rt hand  COGNITION: Overall cognitive status: Within functional limits for tasks assessed  OBSERVATIONS: Pt uses rollator, OA in hands (Rt worse), DDD cervical spine causing additional deficits   TREATMENT DATE: 01/23/24  Fluidotherapy x 10 minutes for Rt hand to address pain, numbness, and stiffness. No adverse reactions.   Quick Dash assessed - see above measurement and updated goal below  Reviewed scar massage and A/ROM HEP  - Pt required extensive cueing to perform correctly, however also has aide that can assist her with exercises  Pt reports she is eating and writing with Rt hand now  Pt issued putty HEP (yellow resistance) for grip and pinch strength and return demo with max cues to perform correctly (aide can assist at home)     PATIENT EDUCATION: Education details: putty HEP  Person educated: Patient and Caregiver   Education method: Explanation, Demonstration, Tactile cues, Verbal cues, and Handouts Education comprehension: verbalized understanding, returned demonstration, verbal cues required, tactile cues required, and needs further education  HOME EXERCISE PROGRAM: 01/09/24: CTR HEP program, scar massage info 01/23/24: putty HEP   GOALS: Goals reviewed with patient? Yes    SHORT TERM GOALS: Target date: 02/09/24   Patient will demonstrate initial RUE HEP with 25% verbal cues or less for proper execution. Baseline: New to outpt OT Goal status: IN Progress -     2.  Pt will independently recall at least 3 joint protection, ergonomics, and body mechanic principles as noted in pt instructions to assist with daily tasks with increased comfort and confidence.  Baseline:  New to outpt OT Goal status: INITIAL   3.  Pt to perform 90% composite flexion Rt  hand Baseline:  Goal status: IN PROGRESS  4.  Pt to demo thumb opposition to long finger Rt hand Baseline:  Goal status: IN PROGRESS      LONG TERM GOALS: Target date: 03/11/24   Patient will demonstrate updated RUE HEP with visual instruction only for proper execution. Baseline: New to outpt OT Goal status: IN PROGRESS   2.  Pt will improve coordination as evidenced by performing 9 hole peg test in 45 sec or less Rt hand Baseline:  60 sec Goal status: INITIAL   3.  Pt will demo 50% or more grip strength Rt hand compared to Lt hand Baseline: unable to assess d/t current precautions Goal status: INITIAL   4.  Quick [place]is to demo 15% or more decrease in deficit Baseline: 73% deficit Goal status: INITIAL   ASSESSMENT:  CLINICAL IMPRESSION: Patient is a 89 y.o. female who was seen today for occupational therapy treatment for bilateral CTS s/p Rt CTR 12/27/23. Pt almost 4 weeks post-op today therefore began light strengthening with putty today. Pt benefits from repetition and review of HEP. Hx includes A-fib, CHF, HLD, HTN, MI, pre-diabetes, mild stroke, OA, DDD cervical spine. Patient currently presents below baseline level of functioning demonstrating functional deficits and impairments as noted below. Pt would benefit from skilled OT services in the outpatient setting to work on impairments as noted below to help pt return to PLOF as able.   [name]   PERFORMANCE DEFICITS: in functional skills including ADLs, IADLs, coordination, sensation, edema, ROM, strength, pain, Fine motor control, Gross motor control, balance, body mechanics, decreased knowledge of precautions, decreased knowledge of use of DME, and UE functional use,.   IMPAIRMENTS: are limiting patient from ADLs, leisure, and social participation.   COMORBIDITIES: may have co-morbidities  that affects occupational performance. Patient will benefit from skilled OT to address above impairments and improve overall  function.  MODIFICATION OR ASSISTANCE TO COMPLETE EVALUATION: No modification of tasks or assist necessary to complete an evaluation.  OT OCCUPATIONAL PROFILE AND HISTORY: Detailed  assessment: Review of records and additional review of physical, cognitive, psychosocial history related to current functional performance.  CLINICAL DECISION MAKING: Moderate - several treatment options, min-mod task modification necessary  REHAB POTENTIAL: Good  EVALUATION COMPLEXITY: Moderate      PLAN:  OT FREQUENCY: 1x/week  OT DURATION: 8 weeks  PLANNED INTERVENTIONS: 97535 self care/ADL training, 02889 therapeutic exercise, 97530 therapeutic activity, 97140 manual therapy, 97035 ultrasound, 97018 paraffin, 02960 fluidotherapy, 97010 moist heat, 97010 cryotherapy, 97034 contrast bath, 97760 Orthotic Initial, 97763 Orthotic/Prosthetic subsequent, functional mobility training, energy conservation, patient/family education, and DME and/or AE instructions  RECOMMENDED OTHER SERVICES: none  CONSULTED AND AGREED WITH PLAN OF CARE: Patient and family member/caregiver  PLAN FOR NEXT SESSION: continue fluido, review HEP's prn, pulsed US , work on [facility] - practice picking up small things, assess grip strength   [name], OT 01/23/2024, 2:55 PM

## 2024-01-23 NOTE — Patient Instructions (Signed)
°  1. Grip Strengthening (Resistive Putty)   Squeeze putty using thumb and all fingers. Repeat _20___ times. Do __1-2__ sessions per day.   2. Roll putty into tube on table and pinch between first two fingers and thumb x 10 reps. Do 1-2 sessions per day

## 2024-01-29 ENCOUNTER — Ambulatory Visit: Admitting: Occupational Therapy

## 2024-01-29 DIAGNOSIS — M6281 Muscle weakness (generalized): Secondary | ICD-10-CM | POA: Diagnosis not present

## 2024-01-29 DIAGNOSIS — R208 Other disturbances of skin sensation: Secondary | ICD-10-CM

## 2024-01-29 DIAGNOSIS — R278 Other lack of coordination: Secondary | ICD-10-CM

## 2024-01-29 DIAGNOSIS — R29898 Other symptoms and signs involving the musculoskeletal system: Secondary | ICD-10-CM

## 2024-01-29 DIAGNOSIS — L905 Scar conditions and fibrosis of skin: Secondary | ICD-10-CM

## 2024-01-29 NOTE — Therapy (Signed)
 " OUTPATIENT OCCUPATIONAL THERAPY ORTHO TREATMENT  Patient Name: [name] MRN: [id number] DOB:July 30, 1933, 89 y.o., female Today's Date: 01/29/2024  PCP: Delores Rojelio Caldron, NP  REFERRING PROVIDER: Arlinda Buster, MD  END OF SESSION:  OT End of Session - 01/29/24 1529     Visit Number 3    Number of Visits 8    Date for Recertification  03/11/24    Authorization Type [employer] Dual complete - covered 100%    OT Start Time 1530    OT Stop Time 1625    OT Time Calculation (min) 55 min    Equipment Utilized During Treatment Fluido/FM objects    Activity Tolerance Patient tolerated treatment well    Behavior During Therapy WFL for tasks assessed/performed          Past Medical History:  Diagnosis Date   Arthritis    all over   Atrial fibrillation (HCC)    Diagnosed ~2009   Breast cancer, left breast (HCC) 02/07/1996   S/P chemo and mastectomy    CHF (congestive heart failure) (HCC)    GERD (gastroesophageal reflux disease)    Hyperlipidemia    Hypertension    Left shoulder pain    MRI showed pinched nerve - per pt   MI (myocardial infarction) (HCC)    OSA on CPAP    Pneumonia    4-5 times (01/06/2015)   Type II diabetes mellitus (HCC)    Past Surgical History:  Procedure Laterality Date   BIOPSY  07/23/2022   Procedure: BIOPSY;  Surgeon: Kriss Estefana DEL, DO;  Location: [facility];  Service: Gastroenterology;;   BREAST BIOPSY Left 02/07/1996   CARPAL TUNNEL RELEASE Right 2025   CATARACT EXTRACTION W/ INTRAOCULAR LENS  IMPLANT, BILATERAL Bilateral    ESOPHAGOGASTRODUODENOSCOPY N/A 07/23/2022   Procedure: ESOPHAGOGASTRODUODENOSCOPY (EGD);  Surgeon: Kriss Estefana DEL, DO;  Location: [facility];  Service: Gastroenterology;  Laterality: N/A;   JOINT REPLACEMENT     LAPAROSCOPIC CHOLECYSTECTOMY     LEFT HEART CATH AND CORONARY ANGIOGRAPHY N/A 07/07/2020   Procedure: LEFT HEART CATH AND CORONARY ANGIOGRAPHY;  Surgeon: Dann Candyce RAMAN, MD;   Location: [facility] IN[facility];  Service: Cardiovascular;  Laterality: N/A;   MASTECTOMY Left 02/07/1996   TOTAL KNEE ARTHROPLASTY Left 02/07/1999   TOTAL KNEE REVISION Left 01/28/2013   Procedure: LEFT TOTAL KNEE ARTHROPLASTY REVISION;  Surgeon: Kay Ozell Cummins, MD;  Location: [facility] OR;  Service: Orthopedics;  Laterality: Left;   Patient Active Problem List   Diagnosis Date Noted   Acute CVA (cerebrovascular accident) (HCC) 12/05/2023   CHF exacerbation (HCC) 09/12/2020   Hypomagnesemia 09/12/2020   Acute on chronic diastolic CHF (congestive heart failure) (HCC) 09/11/2020   OSA on CPAP    CKD (chronic kidney disease), stage III (HCC)    Anemia    Hypokalemia    Benign essential hypertension 07/15/2020   Malnutrition of moderate degree 07/09/2020   Chest pain of uncertain etiology    Unstable angina (HCC) 07/03/2020   Primary osteoarthritis, right ankle and foot 01/24/2018   Chronic idiopathic constipation 09/28/2017   Arthritis, multiple joint involvement 09/28/2017   Arthritis of right ankle 08/21/2017   Controlled type 2 diabetes mellitus with hyperglycemia, without long-term current use of insulin  (HCC) 08/17/2017   Allergic urticaria 09/07/2016   Generalized pruritus 09/07/2016   Moderate persistent asthma 09/07/2016   Perennial and seasonal allergic rhinitis 09/07/2016   Seasonal allergic rhinitis due to pollen 09/07/2016   History of breast cancer in female 01/12/2016  Chest pain 01/06/2015   Angina at rest 01/06/2015   Diabetes (HCC) 01/06/2015   Persistent atrial fibrillation (HCC) 01/06/2015   HLD (hyperlipidemia) 01/06/2015   Tobacco abuse 01/06/2015   Tobacco dependence syndrome 01/06/2015   Type II diabetes mellitus (HCC)    Painful total knee replacement 01/28/2013   Chronic obstructive asthma (HCC) 06/18/2012   Gastroesophageal reflux disease without esophagitis 09/18/2011   Atrial fibrillation (HCC)    Left shoulder pain    Hypertension     ONSET DATE:  12/19/2023 (referral date)   REFERRING DIAG: G56.03 (ICD-10-CM) - Bilateral carpal tunnel syndrome  Needs OT 2 weeks s/p right CTR 12/27/23 NO SPLINT  THERAPY DIAG:  Muscle weakness (generalized)  Other lack of coordination  Other disturbances of skin sensation  Scar condition and fibrosis of skin  Other symptoms and signs involving the musculoskeletal system  Rationale for Evaluation and Treatment: Rehabilitation  SUBJECTIVE:   SUBJECTIVE STATEMENT: Pt reports no pain today but some achiness this week. Pt reports numbness all the way around distal phalanges but that it is better since the surgery.  Pt accompanied by: Caregiver  PERTINENT HISTORY: A-fib, CHF, HLD, HTN, MI, pre-diabetes, mild stroke, DDD cervical spine  PRECAUTIONS: Other: per protocol    WEIGHT BEARING RESTRICTIONS: No  PAIN:  Are you having pain? No but up to 4-5/10 over past week   FALLS: Has patient fallen in last 6 months? Yes. Number of falls 1  LIVING ENVIRONMENT: Lives with: lives with their family and daughter Lives in: 2 story house, flight of steps up to bedroom but has stair lift, threshold to enter Has following equipment at home: Quad cane small base, shower chair, and rollator  PLOF: needed assist for ADLs due to hands and back   PATIENT GOALS: get my hand better  NEXT MD VISIT: 02/06/24  OBJECTIVE:  Note: Objective measures were completed at Evaluation unless otherwise noted.  HAND DOMINANCE: Right  ADLs: Overall ADLs: Max assist for BADLS - has aide M-F (3 hrs/day)   FUNCTIONAL OUTCOME MEASURES: Quick Dash: Eval: TBA 01/23/24: Quick Dash = 73% deficit RUE  UPPER EXTREMITY ROM:   BUE AROM limited in shoulders to approx 70-75% flexion, ER less than 50%, BUE elbows, FA's WFLs,  75% wrist movement, Lt hand full composite flexion, Rt approx 90% limited d/t CTR and arthritis. Pt has arthritis MP joints both hands more radially (index and long fingers) but worse Rt hand. Rt thumb  can only oppose to index finger  UPPER EXTREMITY MMT:   not tested   HAND FUNCTION: Grip strength: Right: TBA at 4 weeks post-op; Left: 13 lbs  01/29/24: Grip Strength Right 18.2, 18.2, 19.1lbs; Left 21.6, 25.1, 20.0 lbs Average Right: 18.5 lbs; Left 22.2 lbs  COORDINATION: 9 Hole Peg test: Right: 60 sec; Left: 42 sec  SENSATION: Pt reports numbness remaining first 3 digits  EDEMA: moderate Rt hand  COGNITION: Overall cognitive status: Within functional limits for tasks assessed  OBSERVATIONS: Pt uses rollator, OA in hands (Rt worse), DDD cervical spine causing additional deficits   TREATMENT DATE: 01/29/24                                                                                                                            -  Therapeutic exercises completed for duration as noted below including:  Pt placed RUE in Fluidotherapy machine with supervised ROM x 10 min. Pt was educated to complete AROM during modality time to improve ROM and decrease pain/stiffness of affected extremity by use of the machine's massaging action and thermal properties. Pt encourage to complete tendon gliding exercises per handout at the machine to isolate DIP, PIP and MCP joints for straight finger position, hook (DIP/PIP flexion), fist (DIP/PIP/MCP flexion), taco/duck (MCP flexion only) and flat fist (MCP and PIP flexion).  - Manual therapy completed for duration as noted below including:  Therapist provided manual techniques and educated pt in scar massage at healed site of incision for reduction of scar tissue, to promote improved AROM and pain reduction of affected surgical site. Improved texture of incision noted upon completion with less palpable adhesions and therefore improved ROM.  Pt provided instruction re: massaging scar in three directions: circles, side to side, and up and down with return demonstration sought and handout provided per pt instruction.  Pt required cues to increase pressure  during massage with caregiver present and expressing understanding re: techniques also.   - Therapeutic activities completed for duration as noted below including:  Coordination Exercise/Activity handout with images provided for various activities to work on R UE finger ROM, dexterity and isolated movements with demonstration and practice, as well as modification, hand over hand guidance and cues throughout to improve technique, digital isolation and ease of performing task.  Tasks included:  Pick up coins, dominoes, buttons, marbles, dried beans/pasta of different sizes ... To place in containers To stack - with guidance to work on include/isolate specific fingers. To pick up items one at a time until patient got 5+ in their hand and then move item from palm to fingertips to release ie) Finger-to-palm then palm-to-finger translation of small items - Options to vary difficulty include using a washcloth under items like coins or using larger items (checkers vs coins or blocks/dominos vs dice) for increased ease of picking up items.  Shuffling, Flipping and dealing cards 1 at a time. -- Setup patient to work on sorting cards, focusing on using index finger with thumb to flip cards or holding deck of cards in palm of hand and push off 1 card at a time from the top of the deck using only thumb.  Pt able to teach OT how to play Deuces per her recall of the 5 cards/hand game.     Rotate golf balls (clockwise and counter-clockwise) with forearm pronated and balls on table or supinated and balls in hand.   Twirl pen/cil between fingers. - Encouragement to isolate fingers individually and twirl (rotation) or flipping and shift up and down the pen (translation) to get it in position for writing or erasing.    Tear a piece of paper towel and roll it into small balls with fingertips ie) straw wrapping when eating out.    Patient is encouraged to take breaks, relax arm/shoulder by supporting forearm,  minimize compensatory motions and a try different activities throughout the day/week including games like Londa (for the dice), card games, Connect 4 etc.   Patient benefited from extra time, verbal/tactile cues, and modeling of task to allow time for processing of verbal instructions and improve motor planning of unfamiliar movements.   PATIENT EDUCATION: Education details: Scar massage and Coordination Activities Person educated: Patient and Caregiver   Education method: Explanation, Demonstration, Tactile cues, Verbal cues, and Handouts Education comprehension: verbalized understanding, returned demonstration,  verbal cues required, tactile cues required, and needs further education  HOME EXERCISE PROGRAM: 01/09/24: CTR HEP program, scar massage info 01/23/24: putty HEP  01/29/24: Scar massage handout, Coordination HEP  GOALS: Goals reviewed with patient? Yes    SHORT TERM GOALS: Target date: 02/09/24   Patient will demonstrate initial RUE HEP with 25% verbal cues or less for proper execution. Baseline: New to outpt OT Goal status: MET    2.  Pt will independently recall at least 3 joint protection, ergonomics, and body mechanic principles as noted in pt instructions to assist with daily tasks with increased comfort and confidence.  Baseline:  New to outpt OT Goal status: IN Progress   3.  Pt to perform 90% composite flexion Rt hand Baseline:  Goal status: IN PROGRESS  4.  Pt to demo thumb opposition to long finger Rt hand Baseline:  Goal status: IN PROGRESS      LONG TERM GOALS: Target date: 03/11/24   Patient will demonstrate updated RUE HEP with visual instruction only for proper execution. Baseline: New to outpt OT Goal status: IN PROGRESS   2.  Pt will improve coordination as evidenced by performing 9 hole peg test in 45 sec or less Rt hand Baseline:  60 sec Goal status: IN Progress   3.  Pt will demo  equal or greater grip strength in Rt hand compared to Lt  hand Baseline: unable to assess d/t current precautions Goal status: MET & Revised 01/29/24 Average Right: 18.5 lbs; Left 22.2 lbs   4.  Quick Hollis to demo 15% or more decrease in deficit Baseline: 73% deficit Goal status: IN Progress  ASSESSMENT:  CLINICAL IMPRESSION: Patient is a 89 y.o. female who was seen today for occupational therapy treatment for bilateral CTS s/p Rt CTR 12/27/23. Pt almost 5 weeks post-op today therefore reviewed scar massage, light strengthening and variety of coordination activities. Pt benefits from repetition and review of HEP. Pt will benefit from skilled OT services in the outpatient setting to work on impairments as noted at eval to help pt return to max level of independence and safety.   PERFORMANCE DEFICITS: in functional skills including ADLs, IADLs, coordination, sensation, edema, ROM, strength, pain, Fine motor control, Gross motor control, balance, body mechanics, decreased knowledge of precautions, decreased knowledge of use of DME, and UE functional use,.   IMPAIRMENTS: are limiting patient from ADLs, leisure, and social participation.   COMORBIDITIES: may have co-morbidities  that affects occupational performance. Patient will benefit from skilled OT to address above impairments and improve overall function.  REHAB POTENTIAL: Good  PLAN:  OT FREQUENCY: 1x/week  OT DURATION: 8 weeks  PLANNED INTERVENTIONS: 97535 self care/ADL training, 02889 therapeutic exercise, 97530 therapeutic activity, 97140 manual therapy, 97035 ultrasound, 97018 paraffin, 02960 fluidotherapy, 97010 moist heat, 97010 cryotherapy, 97034 contrast bath, 97760 Orthotic Initial, 97763 Orthotic/Prosthetic subsequent, functional mobility training, energy conservation, patient/family education, and DME and/or AE instructions  RECOMMENDED OTHER SERVICES: none  CONSULTED AND AGREED WITH PLAN OF CARE: Patient and family member/caregiver  PLAN FOR NEXT SESSION:  continue fluido,  review HEP's prn, pulsed US ,  Scar massage (cup/IASTM) Progress putty and Coordination (01/29/24) HEPs  Possible trial of paraffin for home modality consideration   Clarita LITTIE Pride, OT 01/29/2024, 4:34 PM   "

## 2024-01-29 NOTE — Patient Instructions (Signed)
 SABRA

## 2024-02-05 NOTE — Progress Notes (Unsigned)
" ° °  [name] - 89 y.o. female MRN [id number]  Date of birth: 22-Aug-1933  Office Visit Note: Visit Date: 02/06/2024 PCP: Delores Rojelio Caldron, NP Referred by: Delores Rojelio Caldron, NP  Subjective:  HPI: [name] is a 89 y.o. female who presents today for follow up 6 weeks status post right wrist open carpal tunnel release.  Pertinent ROS were reviewed with the patient and found to be negative unless otherwise specified above in HPI.   Assessment & Plan: Visit Diagnoses: No diagnosis found.  Plan: ***  Follow-up: No follow-ups on file.   Meds & Orders: No orders of the defined types were placed in this encounter.  No orders of the defined types were placed in this encounter.    Procedures: No procedures performed       Objective:   Vital Signs: There were no vitals taken for this visit.  Ortho Exam ***  Imaging: No results found.   Amiir Heckard Afton Alderton, M.D. [facility], Hand Surgery  "

## 2024-02-06 ENCOUNTER — Ambulatory Visit: Admitting: Orthopedic Surgery

## 2024-02-06 ENCOUNTER — Ambulatory Visit: Admitting: Occupational Therapy

## 2024-02-06 ENCOUNTER — Encounter: Payer: Self-pay | Admitting: Occupational Therapy

## 2024-02-06 DIAGNOSIS — G5602 Carpal tunnel syndrome, left upper limb: Secondary | ICD-10-CM | POA: Diagnosis not present

## 2024-02-06 DIAGNOSIS — M6281 Muscle weakness (generalized): Secondary | ICD-10-CM

## 2024-02-06 DIAGNOSIS — R278 Other lack of coordination: Secondary | ICD-10-CM

## 2024-02-06 DIAGNOSIS — Z9889 Other specified postprocedural states: Secondary | ICD-10-CM

## 2024-02-06 DIAGNOSIS — R208 Other disturbances of skin sensation: Secondary | ICD-10-CM

## 2024-02-06 DIAGNOSIS — R29898 Other symptoms and signs involving the musculoskeletal system: Secondary | ICD-10-CM

## 2024-02-06 NOTE — Therapy (Signed)
 " OUTPATIENT OCCUPATIONAL THERAPY ORTHO TREATMENT  Patient Name: [name] MRN: [id number] DOB:26-Dec-1933, 89 y.o., female Today's Date: 02/06/2024  PCP: Delores Rojelio Caldron, NP  REFERRING PROVIDER: Arlinda Buster, MD  END OF SESSION:  OT End of Session - 02/06/24 1018     Visit Number 4    Number of Visits 8    Date for Recertification  03/11/24    Authorization Type [employer] Dual complete - covered 100%    OT Start Time 1015    OT Stop Time 1055    OT Time Calculation (min) 40 min    Equipment Utilized During Treatment Fluido/FM objects    Activity Tolerance Patient tolerated treatment well    Behavior During Therapy WFL for tasks assessed/performed          Past Medical History:  Diagnosis Date   Arthritis    all over   Atrial fibrillation (HCC)    Diagnosed ~2009   Breast cancer, left breast (HCC) 02/07/1996   S/P chemo and mastectomy    CHF (congestive heart failure) (HCC)    GERD (gastroesophageal reflux disease)    Hyperlipidemia    Hypertension    Left shoulder pain    MRI showed pinched nerve - per pt   MI (myocardial infarction) (HCC)    OSA on CPAP    Pneumonia    4-5 times (01/06/2015)   Type II diabetes mellitus (HCC)    Past Surgical History:  Procedure Laterality Date   BIOPSY  07/23/2022   Procedure: BIOPSY;  Surgeon: Kriss Estefana DEL, DO;  Location: [facility];  Service: Gastroenterology;;   BREAST BIOPSY Left 02/07/1996   CARPAL TUNNEL RELEASE Right 2025   CATARACT EXTRACTION W/ INTRAOCULAR LENS  IMPLANT, BILATERAL Bilateral    ESOPHAGOGASTRODUODENOSCOPY N/A 07/23/2022   Procedure: ESOPHAGOGASTRODUODENOSCOPY (EGD);  Surgeon: Kriss Estefana DEL, DO;  Location: [facility];  Service: Gastroenterology;  Laterality: N/A;   JOINT REPLACEMENT     LAPAROSCOPIC CHOLECYSTECTOMY     LEFT HEART CATH AND CORONARY ANGIOGRAPHY N/A 07/07/2020   Procedure: LEFT HEART CATH AND CORONARY ANGIOGRAPHY;  Surgeon: Dann Candyce RAMAN, MD;   Location: [facility];  Service: Cardiovascular;  Laterality: N/A;   MASTECTOMY Left 02/07/1996   TOTAL KNEE ARTHROPLASTY Left 02/07/1999   TOTAL KNEE REVISION Left 01/28/2013   Procedure: LEFT TOTAL KNEE ARTHROPLASTY REVISION;  Surgeon: Kay Ozell Cummins, MD;  Location: [facility] OR;  Service: Orthopedics;  Laterality: Left;   Patient Active Problem List   Diagnosis Date Noted   Acute CVA (cerebrovascular accident) (HCC) 12/05/2023   CHF exacerbation (HCC) 09/12/2020   Hypomagnesemia 09/12/2020   Acute on chronic diastolic CHF (congestive heart failure) (HCC) 09/11/2020   OSA on CPAP    CKD (chronic kidney disease), stage III (HCC)    Anemia    Hypokalemia    Benign essential hypertension 07/15/2020   Malnutrition of moderate degree 07/09/2020   Chest pain of uncertain etiology    Unstable angina (HCC) 07/03/2020   Primary osteoarthritis, right ankle and foot 01/24/2018   Chronic idiopathic constipation 09/28/2017   Arthritis, multiple joint involvement 09/28/2017   Arthritis of right ankle 08/21/2017   Controlled type 2 diabetes mellitus with hyperglycemia, without long-term current use of insulin  (HCC) 08/17/2017   Allergic urticaria 09/07/2016   Generalized pruritus 09/07/2016   Moderate persistent asthma 09/07/2016   Perennial and seasonal allergic rhinitis 09/07/2016   Seasonal allergic rhinitis due to pollen 09/07/2016   History of breast cancer in female 01/12/2016  Chest pain 01/06/2015   Angina at rest 01/06/2015   Diabetes (HCC) 01/06/2015   Persistent atrial fibrillation (HCC) 01/06/2015   HLD (hyperlipidemia) 01/06/2015   Tobacco abuse 01/06/2015   Tobacco dependence syndrome 01/06/2015   Type II diabetes mellitus (HCC)    Painful total knee replacement 01/28/2013   Chronic obstructive asthma (HCC) 06/18/2012   Gastroesophageal reflux disease without esophagitis 09/18/2011   Atrial fibrillation (HCC)    Left shoulder pain    Hypertension     ONSET DATE:  12/19/2023 (referral date)   REFERRING DIAG: G56.03 (ICD-10-CM) - Bilateral carpal tunnel syndrome  Needs OT 2 weeks s/p right CTR 12/27/23 NO SPLINT  THERAPY DIAG:  Other disturbances of skin sensation  Other symptoms and signs involving the musculoskeletal system  Muscle weakness (generalized)  Other lack of coordination  Rationale for Evaluation and Treatment: Rehabilitation  SUBJECTIVE:   SUBJECTIVE STATEMENT: No pain Rt hand, and some occasional pain in Lt hand. Pt saw Dr. Erwin this morning and decided to proceed with Lt CTR - to be scheduled soon. Doing well with Rt CTR  Pt accompanied by: Caregiver  PERTINENT HISTORY: A-fib, CHF, HLD, HTN, MI, pre-diabetes, mild stroke, DDD cervical spine  PRECAUTIONS: Other: per protocol    WEIGHT BEARING RESTRICTIONS: No  PAIN:  Are you having pain? No but up to 4-5/10 over past week   FALLS: Has patient fallen in last 6 months? Yes. Number of falls 1  LIVING ENVIRONMENT: Lives with: lives with their family and daughter Lives in: 2 story house, flight of steps up to bedroom but has stair lift, threshold to enter Has following equipment at home: Quad cane small base, shower chair, and rollator  PLOF: needed assist for ADLs due to hands and back   PATIENT GOALS: get my hand better  NEXT MD VISIT: 02/06/24  OBJECTIVE:  Note: Objective measures were completed at Evaluation unless otherwise noted.  HAND DOMINANCE: Right  ADLs: Overall ADLs: Max assist for BADLS - has aide M-F (3 hrs/day)   FUNCTIONAL OUTCOME MEASURES: Quick Dash: Eval: TBA 01/23/24: Quick Dash = 73% deficit RUE  UPPER EXTREMITY ROM:   BUE AROM limited in shoulders to approx 70-75% flexion, ER less than 50%, BUE elbows, FA's WFLs,  75% wrist movement, Lt hand full composite flexion, Rt approx 90% limited d/t CTR and arthritis. Pt has arthritis MP joints both hands more radially (index and long fingers) but worse Rt hand. Rt thumb can only oppose to index  finger  UPPER EXTREMITY MMT:   not tested   HAND FUNCTION: Grip strength: Right: TBA at 4 weeks post-op; Left: 13 lbs  01/29/24: Grip Strength Right 18.2, 18.2, 19.1lbs; Left 21.6, 25.1, 20.0 lbs Average Right: 18.5 lbs; Left 22.2 lbs  COORDINATION: 9 Hole Peg test: Right: 60 sec; Left: 42 sec  SENSATION: Pt reports numbness remaining first 3 digits  EDEMA: moderate Rt hand  COGNITION: Overall cognitive status: Within functional limits for tasks assessed  OBSERVATIONS: Pt uses rollator, OA in hands (Rt worse), DDD cervical spine causing additional deficits   TREATMENT DATE: 02/06/24  Fluidotherapy x 10 minutes for Rt hand to address stiffness and desensitization. No adverse reactions.   Reviewed coordination HEP from 01/29/24 w/ mod cues and modifications/simplification to ensure proper carryover.   Reviewed goals and progress to date - see goal section Grip strength Rt = 20.5 lbs 9 hole peg test Rt = 42 sec  Pt saw MD this morning and will proceed with Lt CTR -  may need to d/c once surgery scheduled.                                                                                                                               PATIENT EDUCATION: Education details: Scar massage and Coordination Activities Person educated: Patient and Caregiver   Education method: Explanation, Demonstration, Tactile cues, Verbal cues, and Handouts Education comprehension: verbalized understanding, returned demonstration, verbal cues required, tactile cues required, and needs further education  HOME EXERCISE PROGRAM: 01/09/24: CTR HEP program, scar massage info 01/23/24: putty HEP  01/29/24: Scar massage handout, Coordination HEP  GOALS: Goals reviewed with patient? Yes    SHORT TERM GOALS: Target date: 02/09/24   Patient will demonstrate initial RUE HEP with 25% verbal cues or less for proper execution. Baseline: New to outpt OT Goal status: MET    2.  Pt will independently recall  at least 3 joint protection, ergonomics, and body mechanic principles as noted in pt instructions to assist with daily tasks with increased comfort and confidence.  Baseline:  New to outpt OT Goal status: DEFERRED   3.  Pt to perform 90% composite flexion Rt hand Baseline:  Goal status: MET  4.  Pt to demo thumb opposition to long finger Rt hand Baseline:  Goal status: MET      LONG TERM GOALS: Target date: 03/11/24   Patient will demonstrate updated RUE HEP with visual instruction only for proper execution. Baseline: New to outpt OT Goal status: MET    2.  Pt will improve coordination as evidenced by performing 9 hole peg test in 45 sec or less Rt hand Baseline:  60 sec 02/06/24: 42 sec  Goal status: MET    3.  Pt will demo  equal or greater grip strength in Rt hand compared to Lt hand Baseline: unable to assess d/t current precautions Goal status: MET & Revised 01/29/24 Average Right: 18.5 lbs; Left 22.2 lbs 02/06/24: Rt = 20.5 lbs   4.  Quick Hollis to demo 15% or more decrease in deficit Baseline: 73% deficit Goal status: IN Progress  ASSESSMENT:  CLINICAL IMPRESSION: Patient is a 89 y.o. female who was seen today for occupational therapy treatment for bilateral CTS s/p Rt CTR 12/27/23. Pt almost 6 weeks post-op today therefore reviewed scar massage, light strengthening and variety of coordination activities. Pt benefits from repetition and review of HEP. Pt will benefit from skilled OT services in the outpatient setting to work on impairments as noted at eval to help pt return to max level of independence and safety.   PERFORMANCE DEFICITS: in functional skills including ADLs, IADLs, coordination, sensation, edema, ROM, strength, pain, Fine motor control, Gross motor control, balance, body mechanics, decreased knowledge of precautions, decreased knowledge of use of DME, and UE functional use,.   IMPAIRMENTS: are limiting patient from ADLs, leisure, and social participation.    COMORBIDITIES: may have co-morbidities  that affects occupational performance. Patient will benefit  from skilled OT to address above impairments and improve overall function.  REHAB POTENTIAL: Good  PLAN:  OT FREQUENCY: 1x/week  OT DURATION: 8 weeks  PLANNED INTERVENTIONS: 97535 self care/ADL training, 02889 therapeutic exercise, 97530 therapeutic activity, 97140 manual therapy, 97035 ultrasound, 97018 paraffin, 02960 fluidotherapy, 97010 moist heat, 97010 cryotherapy, 97034 contrast bath, 97760 Orthotic Initial, 97763 Orthotic/Prosthetic subsequent, functional mobility training, energy conservation, patient/family education, and DME and/or AE instructions  RECOMMENDED OTHER SERVICES: none  CONSULTED AND AGREED WITH PLAN OF CARE: Patient and family member/caregiver  PLAN FOR NEXT SESSION Pt to bring in all exercises to review for when she can do them following LT CTR and date each for her if surgery has been scheduled Possible trial of paraffin for home modality consideration  Possible D/C next session - assess remaining goal (LTG #4)   Burnard JINNY Roads, OT 02/06/2024, 10:57 AM   "

## 2024-02-13 ENCOUNTER — Ambulatory Visit: Admitting: Occupational Therapy

## 2024-02-13 ENCOUNTER — Ambulatory Visit: Attending: Orthopedic Surgery | Admitting: Physical Therapy

## 2024-02-13 DIAGNOSIS — R29898 Other symptoms and signs involving the musculoskeletal system: Secondary | ICD-10-CM | POA: Insufficient documentation

## 2024-02-13 DIAGNOSIS — M542 Cervicalgia: Secondary | ICD-10-CM | POA: Insufficient documentation

## 2024-02-13 DIAGNOSIS — R278 Other lack of coordination: Secondary | ICD-10-CM | POA: Insufficient documentation

## 2024-02-13 DIAGNOSIS — M6281 Muscle weakness (generalized): Secondary | ICD-10-CM | POA: Diagnosis present

## 2024-02-13 DIAGNOSIS — M79641 Pain in right hand: Secondary | ICD-10-CM | POA: Insufficient documentation

## 2024-02-13 DIAGNOSIS — M4802 Spinal stenosis, cervical region: Secondary | ICD-10-CM | POA: Diagnosis not present

## 2024-02-13 DIAGNOSIS — G8929 Other chronic pain: Secondary | ICD-10-CM | POA: Diagnosis not present

## 2024-02-13 DIAGNOSIS — R29818 Other symptoms and signs involving the nervous system: Secondary | ICD-10-CM | POA: Diagnosis present

## 2024-02-13 DIAGNOSIS — I63512 Cerebral infarction due to unspecified occlusion or stenosis of left middle cerebral artery: Secondary | ICD-10-CM | POA: Diagnosis not present

## 2024-02-13 DIAGNOSIS — R2689 Other abnormalities of gait and mobility: Secondary | ICD-10-CM | POA: Insufficient documentation

## 2024-02-13 NOTE — Therapy (Signed)
 " OUTPATIENT PHYSICAL THERAPY NEURO EVALUATION   Patient Name: [name] MRN: [id number] DOB:04-May-1933, 89 y.o., female Today's Date: 02/13/2024   PCP: Delores Rojelio Caldron, NP REFERRING PROVIDER: Cary No, NP  END OF SESSION:  PT End of Session - 02/13/24 1403     Visit Number 1    Number of Visits 13   with eval   Date for Recertification  04/09/24    Authorization Type [employer] Dual    PT Start Time 1400    PT Stop Time 1444    PT Time Calculation (min) 44 min    Equipment Utilized During Treatment Gait belt    Activity Tolerance Patient tolerated treatment well    Behavior During Therapy WFL for tasks assessed/performed          Past Medical History:  Diagnosis Date   Arthritis    all over   Atrial fibrillation (HCC)    Diagnosed ~2009   Breast cancer, left breast (HCC) 02/07/1996   S/P chemo and mastectomy    CHF (congestive heart failure) (HCC)    GERD (gastroesophageal reflux disease)    Hyperlipidemia    Hypertension    Left shoulder pain    MRI showed pinched nerve - per pt   MI (myocardial infarction) (HCC)    OSA on CPAP    Pneumonia    4-5 times (01/06/2015)   Type II diabetes mellitus (HCC)    Past Surgical History:  Procedure Laterality Date   BIOPSY  07/23/2022   Procedure: BIOPSY;  Surgeon: Kriss Estefana DEL, DO;  Location: [facility];  Service: Gastroenterology;;   BREAST BIOPSY Left 02/07/1996   CARPAL TUNNEL RELEASE Right 2025   CATARACT EXTRACTION W/ INTRAOCULAR LENS  IMPLANT, BILATERAL Bilateral    ESOPHAGOGASTRODUODENOSCOPY N/A 07/23/2022   Procedure: ESOPHAGOGASTRODUODENOSCOPY (EGD);  Surgeon: Kriss Estefana DEL, DO;  Location: [facility];  Service: Gastroenterology;  Laterality: N/A;   JOINT REPLACEMENT     LAPAROSCOPIC CHOLECYSTECTOMY     LEFT HEART CATH AND CORONARY ANGIOGRAPHY N/A 07/07/2020   Procedure: LEFT HEART CATH AND CORONARY ANGIOGRAPHY;  Surgeon: Dann Candyce RAMAN, MD;  Location: [facility] INVASIVE CV LAB;   Service: Cardiovascular;  Laterality: N/A;   MASTECTOMY Left 02/07/1996   TOTAL KNEE ARTHROPLASTY Left 02/07/1999   TOTAL KNEE REVISION Left 01/28/2013   Procedure: LEFT TOTAL KNEE ARTHROPLASTY REVISION;  Surgeon: Kay Ozell Cummins, MD;  Location: [facility] OR;  Service: Orthopedics;  Laterality: Left;   Patient Active Problem List   Diagnosis Date Noted   Acute CVA (cerebrovascular accident) (HCC) 12/05/2023   CHF exacerbation (HCC) 09/12/2020   Hypomagnesemia 09/12/2020   Acute on chronic diastolic CHF (congestive heart failure) (HCC) 09/11/2020   OSA on CPAP    CKD (chronic kidney disease), stage III (HCC)    Anemia    Hypokalemia    Benign essential hypertension 07/15/2020   Malnutrition of moderate degree 07/09/2020   Chest pain of uncertain etiology    Unstable angina (HCC) 07/03/2020   Primary osteoarthritis, right ankle and foot 01/24/2018   Chronic idiopathic constipation 09/28/2017   Arthritis, multiple joint involvement 09/28/2017   Arthritis of right ankle 08/21/2017   Controlled type 2 diabetes mellitus with hyperglycemia, without long-term current use of insulin  (HCC) 08/17/2017   Allergic urticaria 09/07/2016   Generalized pruritus 09/07/2016   Moderate persistent asthma 09/07/2016   Perennial and seasonal allergic rhinitis 09/07/2016   Seasonal allergic rhinitis due to pollen 09/07/2016   History of breast cancer in female 01/12/2016  Chest pain 01/06/2015   Angina at rest 01/06/2015   Diabetes (HCC) 01/06/2015   Persistent atrial fibrillation (HCC) 01/06/2015   HLD (hyperlipidemia) 01/06/2015   Tobacco abuse 01/06/2015   Tobacco dependence syndrome 01/06/2015   Type II diabetes mellitus (HCC)    Painful total knee replacement 01/28/2013   Chronic obstructive asthma (HCC) 06/18/2012   Gastroesophageal reflux disease without esophagitis 09/18/2011   Atrial fibrillation (HCC)    Left shoulder pain    Hypertension     ONSET DATE: 01/08/2024 (referral  date)  REFERRING DIAG: P36.487 (ICD-10-CM) - Acute ischemic left MCA stroke (HCC) M54.2,G89.29 (ICD-10-CM) - Chronic neck pain M48.02 (ICD-10-CM) - Foraminal stenosis of cervical region R26.89 (ICD-10-CM) - Imbalance  THERAPY DIAG:  Other symptoms and signs involving the musculoskeletal system  Muscle weakness (generalized)  Other lack of coordination  Other symptoms and signs involving the nervous system  Other abnormalities of gait and mobility  Rationale for Evaluation and Treatment: Rehabilitation  SUBJECTIVE:                                                                                                                                                                                             SUBJECTIVE STATEMENT:  Pt reports she is having issues wih her balance and is also having chronic neck pain.  Pt reports that she has had issues with her balance since before her stroke, has been using a rollator for about a year. Pt reports that her most recent fall was the night before last. She isn't sure what happened with the fall but she needed help to get back up, was not injured with the fall. Pt reports that her most recent fall prior to that was right before Christmas.  Pt reports that her neck pain is chronic, has been going on for years. She reports she was told she has compression of her nerves and surgery is recommended, however not safe to perform thissurgery at her age.  Pt is currently seeing OT at this clinic after her carpal tunnel surgery, is going to get other side done as well but is not scheduled yet.   Pt accompanied by: self and caregiver [name]; son [name] at end of session for scheduling  PERTINENT HISTORY: PMH: A-fib, CHF, HLD, HTN, MI, pre-diabetes, mild stroke, DDD cervical spine  PAIN:  Are you having pain? Yes: NPRS scale: 6/10 Pain location: neck, down both arms (R worse) Pain description: sharp, achy pain, constant Aggravating factors:  nothing Relieving factors: nothing  PRECAUTIONS: Fall  RED FLAGS: Cervical red flags: Dysphagia No   WEIGHT BEARING RESTRICTIONS: No  FALLS: Has patient  fallen in last 6 months? Yes. Number of falls at least 2 falls in the past month, cannot get back up by herself, no injuries with falls so far  LIVING ENVIRONMENT: Lives with: lives with their family and lives with their daughter Lives in: House/apartment Stairs: 2 story house, flight of steps up to bedroom but has stair lift, threshold to enter Has following equipment at home: Counselling psychologist, Environmental Consultant - 4 wheeled, and shower chair  Pt always has someone with her; caregiver during the day and daughter and SIL at night   PLOF: Requires assistive device for independence, Needs assistance with ADLs, Needs assistance with gait, and Needs assistance with transfers  PATIENT GOALS: reduce pain and improve balance  OBJECTIVE:  Note: Objective measures were completed at Evaluation unless otherwise noted.  DIAGNOSTIC FINDINGS:  Brain MRI 12/05/23 IMPRESSION: 1. Small acute ischemic infarcts involving the left insula and overlying left frontal lobe. No associated hemorrhage or mass effect. 2. Underlying atrophy with mild chronic microvascular ischemic disease.  COGNITION: Overall cognitive status: Within functional limits for tasks assessed   SENSATION: N/T in hands and in feet  COORDINATION: Impaired in R and L side  EDEMA:  None in BLE  POSTURE: rounded shoulders, forward head, and posterior pelvic tilt  CERVICAL ROM:   Active ROM A/PROM (deg) eval  Pain  Flexion 28 Yes, hurts in back of neck  Extension 20 Yes, feels like sand in back of neck  Right lateral flexion 20 Yes, pain on R side of neck  Left lateral flexion 20 Yes, pain on L side of neck  Right rotation 58 Yes, both sides  Left rotation 72 Yes, both sides   (Blank rows = not tested)   UPPER EXTREMITY ROM:  Active ROM Right eval Left eval   Shoulder flexion Limited to less than 90 degrees, painful Limited to less than 90 degrees, painful  Shoulder extension    Shoulder abduction Limited to less than 90 degrees, painful Limited to less than 90 degrees, painful  Shoulder adduction    Shoulder extension    Shoulder internal rotation    Shoulder external rotation    Elbow flexion    Elbow extension    Wrist flexion    Wrist extension    Wrist ulnar deviation    Wrist radial deviation    Wrist pronation    Wrist supination     (Blank rows = not tested)   UPPER EXTREMITY MMT:  MMT Right eval Left eval  Shoulder flexion 2- 3  Shoulder extension    Shoulder abduction 2- 3  Shoulder adduction    Shoulder extension    Shoulder internal rotation    Shoulder external rotation    Middle trapezius    Lower trapezius    Elbow flexion 4 4  Elbow extension 4 4  Wrist flexion    Wrist extension    Wrist ulnar deviation    Wrist radial deviation    Wrist pronation    Wrist supination    Grip strength Slightly decreased Slightly decreased   (Blank rows = not tested)   LOWER EXTREMITY ROM:     Active  Right Eval Left Eval  Hip flexion    Hip extension    Hip abduction    Hip adduction    Hip internal rotation    Hip external rotation    Knee flexion    Knee extension Tight HS Tight HS  Ankle dorsiflexion    Ankle plantarflexion  Ankle inversion    Ankle eversion     (Blank rows = not tested)  LOWER EXTREMITY MMT:    MMT Right Eval Left Eval  Hip flexion 3 4  Hip extension    Hip abduction    Hip adduction    Hip internal rotation    Hip external rotation    Knee flexion 4 5  Knee extension 4 5  Ankle dorsiflexion 4 4  Ankle plantarflexion    Ankle inversion    Ankle eversion    (Blank rows = not tested)  BED MOBILITY:  Does not use AE but has to get assist to get in/out of bed  TRANSFERS: Sit to stand: SBA  Assistive device utilized: Environmental Consultant - 4 wheeled     Stand to sit: SBA  Assistive  device utilized: Environmental Consultant - 4 wheeled     Chair to chair: SBA  Assistive device utilized: Environmental Consultant - 4 wheeled       GAIT: Findings:  Gait pattern: path deviation, decreased step length- Right, decreased step length- Left, decreased hip/knee flexion- Right, decreased hip/knee flexion- Left, and trunk flexed Distance walked: various clinic distances Assistive device utilized: Environmental Consultant - 4 wheeled Level of assistance: CGA Comments: pt needs cues for safe rollator use including hand placement with sit to stands and brake management   FUNCTIONAL TESTS:    [facility] PT Assessment - 02/13/24 1426       Ambulation/Gait   Gait velocity 32.8 ft over 20.91 sec = 1.57 ft/sec   with rollator and CGA     Standardized Balance Assessment   Standardized Balance Assessment Timed Up and Go Test;Five Times Sit to Stand;Berg Balance Test    Five times sit to stand comments  26.78 sec   with UE     Berg Balance Test   Sit to Stand Able to stand  independently using hands    Standing Unsupported Able to stand 2 minutes with supervision    Sitting with Back Unsupported but Feet Supported on Floor or Stool Able to sit 2 minutes under supervision    Stand to Sit Controls descent by using hands    Transfers Able to transfer safely, definite need of hands    Standing Unsupported with Eyes Closed Needs help to keep from falling    Standing Unsupported with Feet Together Needs help to attain position but able to stand for 30 seconds with feet together    From Standing, Reach Forward with Outstretched Arm Loses balance while trying/requires external support    From Standing Position, Pick up Object from Floor Unable to try/needs assist to keep balance    From Standing Position, Turn to Look Behind Over each Shoulder Needs supervision when turning    Turn 360 Degrees Needs assistance while turning    Standing Unsupported, Alternately Place Feet on Step/Stool Needs assistance to keep from falling or unable to try     Standing Unsupported, One Foot in Front Able to take small step independently and hold 30 seconds    Standing on One Leg Unable to try or needs assist to prevent fall    Total Score 19    Berg comment: 19/56, high fall risk      Timed Up and Go Test   TUG Normal TUG    Normal TUG (seconds) 14.75   with rollator and CGA  TREATMENT DATE: PT Evaluation   PATIENT EDUCATION: Education details: Eval findings, PT POC, results of OM and functional implications Person educated: Patient and Teacher, Music Education method: Medical Illustrator Education comprehension: verbalized understanding, returned demonstration, and needs further education  HOME EXERCISE PROGRAM: To be initiated  GOALS: Goals reviewed with patient? Yes  SHORT TERM GOALS: Target date: 03/05/2024   Pt will be independent with initial HEP for improved strength, balance, transfers and gait as well as for increased independence with management of her pain symptoms. Baseline: Goal status: INITIAL  2.  Pt will improve 5 x STS to less than or equal to 23 seconds to demonstrate improved functional strength and transfer efficiency.  Baseline: 26.78 sec BUE (1/7) Goal status: INITIAL  3.  Pt will improve gait velocity to at least 1.75 ft/sec for improved gait efficiency and performance at SBA level  Baseline: 1.57 ft/sec with rollator CGA (1/7) Goal status: INITIAL  4.  Pt will improve Berg score to 23/56 for decreased fall risk Baseline: 19/56 (1/7) Goal status: INITIAL  5.  to be assessed and STG set Baseline:  Goal status: INITIAL     LONG TERM GOALS: Target date: 04/02/2024   Pt will be independent with final HEP for improved strength, balance, transfers and gait as well as for increased independence with management of her pain symptoms. Baseline:  Goal status:  INITIAL  2.  Pt will improve 5 x STS to less than or equal to 20 seconds to demonstrate improved functional strength and transfer efficiency.  Baseline: 26.78 sec BUE (1/7) Goal status: INITIAL  3.  Pt will improve normal TUG to less than or equal to 13 seconds for improved functional mobility and decreased fall risk. Baseline: 14.75 sec with rollator CGA (1/7) Goal status: INITIAL  4.  Pt will improve gait velocity to at least 2.0 ft/sec for improved gait efficiency and performance at SBA level  Baseline: 1.57 ft/sec with rollator CGA (1/7) Goal status: INITIAL  5.  Pt will improve Berg score to 27/56 for decreased fall risk Baseline: 19/56 (1/7) Goal status: INITIAL  6.  to be assessed and LTG set Baseline:  Goal status: INITIAL  ASSESSMENT:  CLINICAL IMPRESSION: Patient is a 89 year old female referred to Neuro OPPT for balance impairments and chronic neck pain.   Pt's PMH is significant for: A-fib, CHF, HLD, HTN, MI, pre-diabetes, mild stroke, DDD cervical spine. The following deficits were present during the exam: sensor impairments, decreased cervical and B shoulder AROM, decreased UE and LE strength, increased pain, and impaired balance. Based on her 5xSTS score of 26.78 sec, TUG score of 14.75 sec, gait speed of 1.57 ft/sec, Berg score of 19/56, and significant fall history as well as strength, ROM, and sensory impairments, pt is an increased risk for falls. Pt would benefit from skilled PT to address these impairments and functional limitations to maximize functional mobility independence.   OBJECTIVE IMPAIRMENTS: Abnormal gait, cardiopulmonary status limiting activity, decreased activity tolerance, decreased balance, decreased endurance, decreased knowledge of use of DME, difficulty walking, decreased ROM, decreased strength, impaired perceived functional ability, impaired UE functional use, improper body mechanics, postural dysfunction, and pain.   ACTIVITY LIMITATIONS:  carrying, lifting, bending, standing, squatting, stairs, transfers, bed mobility, and reach over head  PARTICIPATION LIMITATIONS: meal prep, cleaning, laundry, medication management, driving, and community activity  PERSONAL FACTORS: Age, Time since onset of injury/illness/exacerbation, and 3+ comorbidities:   A-fib, CHF, HLD, HTN, MI, pre-diabetes, mild stroke, DDD  cervical spineare also affecting patient's functional outcome.   REHAB POTENTIAL: Fair chronic neck pain, chronic balance impairments, age  CLINICAL DECISION MAKING: Unstable/unpredictable  EVALUATION COMPLEXITY: High  PLAN:  PT FREQUENCY: 2x/week  PT DURATION: 6 weeks  PLANNED INTERVENTIONS: [id number]- PT Re-evaluation, 97750- Physical Performance Testing, 97110-Therapeutic exercises, 97530- Therapeutic activity, [id number]- Neuromuscular re-education, 97535- Self Care, [id number]- Manual therapy, [phone]- Gait training, [phone]- Electrical stimulation (manual), [phone] (1-2 muscles), [id number] (3+ muscles)- Dry Needling, Patient/Family education, Balance training, Stair training, Taping, Joint mobilization, Spinal mobilization, DME instructions, Cryotherapy, and Moist heat  PLAN FOR NEXT SESSION: assess and set STG/LTG, safe rollator use (brake management, where to place hands during transfers), initiate HEP to work on chronic neck pain (cervical distract, cervical AROM to AAROM stretches, dowel for B shoulder AAROM) and balance and functional strengthening (sit to stands, step taps)   Waddell Southgate, PT Waddell Southgate, PT, DPT, CSRS  02/13/2024, 2:45 PM        "

## 2024-02-20 ENCOUNTER — Ambulatory Visit: Admitting: Occupational Therapy

## 2024-02-20 DIAGNOSIS — R278 Other lack of coordination: Secondary | ICD-10-CM

## 2024-02-20 DIAGNOSIS — M6281 Muscle weakness (generalized): Secondary | ICD-10-CM

## 2024-02-20 DIAGNOSIS — R29898 Other symptoms and signs involving the musculoskeletal system: Secondary | ICD-10-CM

## 2024-02-20 DIAGNOSIS — M79641 Pain in right hand: Secondary | ICD-10-CM

## 2024-02-20 NOTE — Therapy (Signed)
 " OUTPATIENT OCCUPATIONAL THERAPY ORTHO TREATMENT & DISCHARGE SUMMARY  Patient Name: [name] MRN: [id number] DOB:09/25/1933, 89 y.o., female Today's Date: 02/20/2024  PCP: Delores Rojelio Caldron, NP  REFERRING PROVIDER: Arlinda Buster, MD  END OF SESSION:  OT End of Session - 02/20/24 1451     Visit Number 5    Number of Visits 8    Date for Recertification  03/11/24    Authorization Type [employer] Dual complete - covered 100%    OT Start Time 1450    OT Stop Time 1530    OT Time Calculation (min) 40 min    Equipment Utilized During Treatment --    Activity Tolerance Patient tolerated treatment well    Behavior During Therapy WFL for tasks assessed/performed          Past Medical History:  Diagnosis Date   Arthritis    all over   Atrial fibrillation (HCC)    Diagnosed ~2009   Breast cancer, left breast (HCC) 02/07/1996   S/P chemo and mastectomy    CHF (congestive heart failure) (HCC)    GERD (gastroesophageal reflux disease)    Hyperlipidemia    Hypertension    Left shoulder pain    MRI showed pinched nerve - per pt   MI (myocardial infarction) (HCC)    OSA on CPAP    Pneumonia    4-5 times (01/06/2015)   Type II diabetes mellitus (HCC)    Past Surgical History:  Procedure Laterality Date   BIOPSY  07/23/2022   Procedure: BIOPSY;  Surgeon: Kriss Estefana DEL, DO;  Location: [facility];  Service: Gastroenterology;;   BREAST BIOPSY Left 02/07/1996   CARPAL TUNNEL RELEASE Right 2025   CATARACT EXTRACTION W/ INTRAOCULAR LENS  IMPLANT, BILATERAL Bilateral    ESOPHAGOGASTRODUODENOSCOPY N/A 07/23/2022   Procedure: ESOPHAGOGASTRODUODENOSCOPY (EGD);  Surgeon: Kriss Estefana DEL, DO;  Location: [facility];  Service: Gastroenterology;  Laterality: N/A;   JOINT REPLACEMENT     LAPAROSCOPIC CHOLECYSTECTOMY     LEFT HEART CATH AND CORONARY ANGIOGRAPHY N/A 07/07/2020   Procedure: LEFT HEART CATH AND CORONARY ANGIOGRAPHY;  Surgeon: Dann Candyce RAMAN, MD;   Location: [facility];  Service: Cardiovascular;  Laterality: N/A;   MASTECTOMY Left 02/07/1996   TOTAL KNEE ARTHROPLASTY Left 02/07/1999   TOTAL KNEE REVISION Left 01/28/2013   Procedure: LEFT TOTAL KNEE ARTHROPLASTY REVISION;  Surgeon: Kay Ozell Cummins, MD;  Location: [facility];  Service: Orthopedics;  Laterality: Left;   Patient Active Problem List   Diagnosis Date Noted   Acute CVA (cerebrovascular accident) (HCC) 12/05/2023   CHF exacerbation (HCC) 09/12/2020   Hypomagnesemia 09/12/2020   Acute on chronic diastolic CHF (congestive heart failure) (HCC) 09/11/2020   OSA on CPAP    CKD (chronic kidney disease), stage III (HCC)    Anemia    Hypokalemia    Benign essential hypertension 07/15/2020   Malnutrition of moderate degree 07/09/2020   Chest pain of uncertain etiology    Unstable angina (HCC) 07/03/2020   Primary osteoarthritis, right ankle and foot 01/24/2018   Chronic idiopathic constipation 09/28/2017   Arthritis, multiple joint involvement 09/28/2017   Arthritis of right ankle 08/21/2017   Controlled type 2 diabetes mellitus with hyperglycemia, without long-term current use of insulin  (HCC) 08/17/2017   Allergic urticaria 09/07/2016   Generalized pruritus 09/07/2016   Moderate persistent asthma 09/07/2016   Perennial and seasonal allergic rhinitis 09/07/2016   Seasonal allergic rhinitis due to pollen 09/07/2016   History of breast cancer in  female 01/12/2016   Chest pain 01/06/2015   Angina at rest 01/06/2015   Diabetes (HCC) 01/06/2015   Persistent atrial fibrillation (HCC) 01/06/2015   HLD (hyperlipidemia) 01/06/2015   Tobacco abuse 01/06/2015   Tobacco dependence syndrome 01/06/2015   Type II diabetes mellitus (HCC)    Painful total knee replacement 01/28/2013   Chronic obstructive asthma (HCC) 06/18/2012   Gastroesophageal reflux disease without esophagitis 09/18/2011   Atrial fibrillation (HCC)    Left shoulder pain    Hypertension     ONSET DATE:  12/19/2023 (referral date)   REFERRING DIAG: G56.03 (ICD-10-CM) - Bilateral carpal tunnel syndrome  Needs OT 2 weeks s/p right CTR 12/27/23 NO SPLINT  THERAPY DIAG:  Muscle weakness (generalized)  Other lack of coordination  Other symptoms and signs involving the musculoskeletal system  Pain in right hand  Rationale for Evaluation and Treatment: Rehabilitation  SUBJECTIVE:   SUBJECTIVE STATEMENT:  Pt asked when she was going to be done with her hand therapy and was open to being reassessed with determination to DC OT today.  She has put of the Lt CTR surgery at this time but is wearing her splint at night and is doing her exercises as prescribed by OTs during previous sessions.  Pt reports no pain Rt hand - just ongoing sensory changes, and some occasional pain in Lt hand.   Pt accompanied by: Caregiver  PERTINENT HISTORY: A-fib, CHF, HLD, HTN, MI, pre-diabetes, mild stroke, DDD cervical spine  PRECAUTIONS: Other: per protocol    WEIGHT BEARING RESTRICTIONS: No  PAIN:  Are you having pain? No but up to 4-5/10 over past week   FALLS: Has patient fallen in last 6 months? Yes. Number of falls 1  LIVING ENVIRONMENT: Lives with: lives with their family and daughter Lives in: 2 story house, flight of steps up to bedroom but has stair lift, threshold to enter Has following equipment at home: Quad cane small base, shower chair, and rollator  PLOF: needed assist for ADLs due to hands and back   PATIENT GOALS: get my hand better  NEXT MD VISIT: NA  OBJECTIVE:  Note: Objective measures were completed at Evaluation unless otherwise noted.  HAND DOMINANCE: Right  ADLs: Overall ADLs: Max assist for BADLS - has aide M-F (3 hrs/day)   FUNCTIONAL OUTCOME MEASURES: Quick Dash: Eval: TBA 01/23/24: Quick Dash = 73% deficit RUE 02/20/24: Quick Dash = 38.6%    UPPER EXTREMITY ROM:   BUE AROM limited in shoulders to approx 70-75% flexion, ER less than 50%, BUE elbows, FA's  WFLs,  75% wrist movement, Lt hand full composite flexion, Rt approx 90% limited d/t CTR and arthritis. Pt has arthritis MP joints both hands more radially (index and long fingers) but worse Rt hand. Rt thumb can only oppose to index finger  UPPER EXTREMITY MMT:   not tested   HAND FUNCTION: Grip strength: Right: TBA at 4 weeks post-op; Left: 13 lbs  01/29/24: Grip Strength Right 18.2, 18.2, 19.1lbs; Left 21.6, 25.1, 20.0 lbs Average Right: 18.5 lbs; Left 22.2 lbs  02/20/24 Right 21.1, 21.3, 21.8 lbs Left 27.7, 24.9, 24.0 lbs Average Right: 21.4 lbs; Left 25.5 lbs  COORDINATION: 9 Hole Peg test: Right: 60 sec; Left: 42 sec  SENSATION: Pt reports numbness remaining first 3 digits  02/20/24: Pt reports she still feels some numbness in the first 3 digits of her R hand but not as bad as before her surgery.  EDEMA: moderate Rt hand  COGNITION: Overall cognitive status:  Within functional limits for tasks assessed  OBSERVATIONS: Pt uses rollator, OA in hands (Rt worse), DDD cervical spine causing additional deficits   TREATMENT DATE: 02/20/24  - Therapeutic activities completed for duration as noted below including: Therapist reviewed goals with patient and updated patient progression.  No additional functional limitations identified.  Retested grip strength with further improvement from 2 weeks ago on both hands although RUE has not achieved same strength as LUE yet.  Right: 21.4 lbs; Left 25.5 lbs  Reviewed HEPs ie) putty exercises and digital ROM (as needed for stretch breaks and when sensory changes are bothersome).  - Self Care education and training completed for duration as noted below including:  Upon reviewed areas in Quick Dash - Pt was engaged in several of the tasks she reports she could not do ie) - Pt engaged in opening containers with and without use of dycem to help open different size and tightness of fastening with good success with pre opened containers (even if  they were fastened tightly. - Pt engaged in cutting yellow putty with a knife with education to let the knife do the work rather than bearing down on the knife excessively.  Reviewed joint protection suggestions and provided large foam handles for objects ie) fork, pencil etc;also gave her some dycem to open containers and instructed pt to rest as needed, especially if her hands bother her ie) not to over do her word search puzzles etc.  Pt has not being doing puzzles since her surgery and a sample puzzle was brought out for her to practice with.  She was encouraged to use the built up handle on the pen or to use a highlighter which was quite effective for her to draw lines though the words she found rather than having to circle each word.                                                                                         PATIENT EDUCATION: Education details: DC instructions/Joint protection Person educated: Patient and Caregiver   Education method: Explanation, Demonstration, and Verbal cues Education comprehension: verbalized understanding, returned demonstration, and verbal cues required  HOME EXERCISE PROGRAM: 01/09/24: CTR HEP program, scar massage info 01/23/24: putty HEP  01/29/24: Scar massage handout, Coordination HEP  GOALS: Goals reviewed with patient? Yes    SHORT TERM GOALS: Target date: 02/09/24   Patient will demonstrate initial RUE HEP with 25% verbal cues or less for proper execution. Baseline: New to outpt OT Goal status: MET   2.  Pt will independently recall at least 3 joint protection, ergonomics, and body mechanic principles as noted in pt instructions to assist with daily tasks with increased comfort and confidence.  Baseline:  New to outpt OT Goal status: gave pt large handles, dycem to open containers and instructed in rest/stretch breaks   3.  Pt to perform 90% composite flexion Rt hand Baseline:  Goal status: MET  4.  Pt to demo thumb opposition to long  finger Rt hand Baseline:  Goal status: MET      LONG TERM GOALS: Target date: 03/11/24   Patient will demonstrate updated RUE  HEP with visual instruction only for proper execution. Baseline: New to outpt OT Goal status: MET    2.  Pt will improve coordination as evidenced by performing 9 hole peg test in 45 sec or less Rt hand Baseline:  60 sec 02/06/24: 42 sec  Goal status: MET    3.  Pt will demo  equal or greater grip strength in Rt hand compared to Lt hand Baseline: unable to assess d/t current precautions Goal status: MET & Revised 01/29/24 Average Right: 18.5 lbs; Left 22.2 lbs 02/06/24: Rt = 20.5 lbs 02/20/24: Right: 21.4 lbs; Left 25.5 lbs   4.  Quick Hollis to demo 15% or more decrease in deficit Baseline: 73% deficit Goal status: MET 02/20/23: 02/20/24: Junie Hollis = 38.6%  ASSESSMENT:  CLINICAL IMPRESSION: Patient is a 89 y.o. female who was seen today for occupational therapy treatment for bilateral CTS s/p Rt CTR 12/27/23. Pt has decided not to have her L CTR surgery at this time but is satisfied with prrogress and ready for DC from OT at this time.   PERFORMANCE DEFICITS: in functional skills including ADLs, IADLs, coordination, sensation, edema, ROM, strength, pain, Fine motor control, Gross motor control, balance, body mechanics, decreased knowledge of precautions, decreased knowledge of use of DME, and UE functional use,.   IMPAIRMENTS: are limiting patient from ADLs, leisure, and social participation.   COMORBIDITIES: may have co-morbidities  that affects occupational performance. Patient will benefit from skilled OT to address above impairments and improve overall function.  REHAB POTENTIAL: Good  PLAN:  OCCUPATIONAL THERAPY DISCHARGE SUMMARY  Visits from Start of Care: 5  Current functional level related to goals / functional outcomes: Pt has met all initial goals established to satisfactory levels and is pleased with outcomes. She did not achieve R grip =  to L grip yet but is satisfied with home HEP including light putty etc.   Remaining deficits: Pt has no more significant functional deficits or pain.   Education / Equipment: Pt has all needed materials and education. Pt understands how to continue on with self-management. See tx notes for more details.   Patient agrees to discharge due to max benefits received from outpatient occupational therapy / hand therapy at this time.    Clarita LITTIE Pride, OT 02/20/2024, 2:51 PM   "

## 2024-02-25 ENCOUNTER — Encounter: Payer: Self-pay | Admitting: Physical Therapy

## 2024-02-25 ENCOUNTER — Ambulatory Visit: Admitting: Physical Therapy

## 2024-02-25 DIAGNOSIS — R29898 Other symptoms and signs involving the musculoskeletal system: Secondary | ICD-10-CM

## 2024-02-25 DIAGNOSIS — R29818 Other symptoms and signs involving the nervous system: Secondary | ICD-10-CM

## 2024-02-25 DIAGNOSIS — M6281 Muscle weakness (generalized): Secondary | ICD-10-CM

## 2024-02-25 DIAGNOSIS — R278 Other lack of coordination: Secondary | ICD-10-CM

## 2024-02-25 DIAGNOSIS — R2689 Other abnormalities of gait and mobility: Secondary | ICD-10-CM

## 2024-02-25 NOTE — Therapy (Signed)
 " OUTPATIENT PHYSICAL THERAPY NEURO TREATMENT   Patient Name: [name] MRN: [id number] DOB:12-Nov-1933, 89 y.o., female Today's Date: 02/25/2024   PCP: Delores Rojelio Caldron, NP REFERRING PROVIDER: Cary No, NP  END OF SESSION:  PT End of Session - 02/25/24 1407     Visit Number 2    Number of Visits 13   with eval   Date for Recertification  04/09/24    Authorization Type [employer] Dual    PT Start Time 1403    PT Stop Time 1443    PT Time Calculation (min) 40 min    Equipment Utilized During Treatment Gait belt    Activity Tolerance Patient tolerated treatment well    Behavior During Therapy WFL for tasks assessed/performed          Past Medical History:  Diagnosis Date   Arthritis    all over   Atrial fibrillation (HCC)    Diagnosed ~2009   Breast cancer, left breast (HCC) 02/07/1996   S/P chemo and mastectomy    CHF (congestive heart failure) (HCC)    GERD (gastroesophageal reflux disease)    Hyperlipidemia    Hypertension    Left shoulder pain    MRI showed pinched nerve - per pt   MI (myocardial infarction) (HCC)    OSA on CPAP    Pneumonia    4-5 times (01/06/2015)   Type II diabetes mellitus (HCC)    Past Surgical History:  Procedure Laterality Date   BIOPSY  07/23/2022   Procedure: BIOPSY;  Surgeon: Kriss Estefana DEL, DO;  Location: [facility];  Service: Gastroenterology;;   BREAST BIOPSY Left 02/07/1996   CARPAL TUNNEL RELEASE Right 2025   CATARACT EXTRACTION W/ INTRAOCULAR LENS  IMPLANT, BILATERAL Bilateral    ESOPHAGOGASTRODUODENOSCOPY N/A 07/23/2022   Procedure: ESOPHAGOGASTRODUODENOSCOPY (EGD);  Surgeon: Kriss Estefana DEL, DO;  Location: [facility];  Service: Gastroenterology;  Laterality: N/A;   JOINT REPLACEMENT     LAPAROSCOPIC CHOLECYSTECTOMY     LEFT HEART CATH AND CORONARY ANGIOGRAPHY N/A 07/07/2020   Procedure: LEFT HEART CATH AND CORONARY ANGIOGRAPHY;  Surgeon: Dann Candyce RAMAN, MD;  Location: [facility]ASIVE CV LAB;   Service: Cardiovascular;  Laterality: N/A;   MASTECTOMY Left 02/07/1996   TOTAL KNEE ARTHROPLASTY Left 02/07/1999   TOTAL KNEE REVISION Left 01/28/2013   Procedure: LEFT TOTAL KNEE ARTHROPLASTY REVISION;  Surgeon: Kay Ozell Cummins, MD;  Location: [facility] OR;  Service: Orthopedics;  Laterality: Left;   Patient Active Problem List   Diagnosis Date Noted   Acute CVA (cerebrovascular accident) (HCC) 12/05/2023   CHF exacerbation (HCC) 09/12/2020   Hypomagnesemia 09/12/2020   Acute on chronic diastolic CHF (congestive heart failure) (HCC) 09/11/2020   OSA on CPAP    CKD (chronic kidney disease), stage III (HCC)    Anemia    Hypokalemia    Benign essential hypertension 07/15/2020   Malnutrition of moderate degree 07/09/2020   Chest pain of uncertain etiology    Unstable angina (HCC) 07/03/2020   Primary osteoarthritis, right ankle and foot 01/24/2018   Chronic idiopathic constipation 09/28/2017   Arthritis, multiple joint involvement 09/28/2017   Arthritis of right ankle 08/21/2017   Controlled type 2 diabetes mellitus with hyperglycemia, without long-term current use of insulin  (HCC) 08/17/2017   Allergic urticaria 09/07/2016   Generalized pruritus 09/07/2016   Moderate persistent asthma 09/07/2016   Perennial and seasonal allergic rhinitis 09/07/2016   Seasonal allergic rhinitis due to pollen 09/07/2016   History of breast cancer in female 01/12/2016  Chest pain 01/06/2015   Angina at rest 01/06/2015   Diabetes (HCC) 01/06/2015   Persistent atrial fibrillation (HCC) 01/06/2015   HLD (hyperlipidemia) 01/06/2015   Tobacco abuse 01/06/2015   Tobacco dependence syndrome 01/06/2015   Type II diabetes mellitus (HCC)    Painful total knee replacement 01/28/2013   Chronic obstructive asthma (HCC) 06/18/2012   Gastroesophageal reflux disease without esophagitis 09/18/2011   Atrial fibrillation (HCC)    Left shoulder pain    Hypertension     ONSET DATE: 01/08/2024 (referral  date)  REFERRING DIAG: P36.487 (ICD-10-CM) - Acute ischemic left MCA stroke (HCC) M54.2,G89.29 (ICD-10-CM) - Chronic neck pain M48.02 (ICD-10-CM) - Foraminal stenosis of cervical region R26.89 (ICD-10-CM) - Imbalance  THERAPY DIAG:  Muscle weakness (generalized)  Other lack of coordination  Other symptoms and signs involving the musculoskeletal system  Other symptoms and signs involving the nervous system  Other abnormalities of gait and mobility  Rationale for Evaluation and Treatment: Rehabilitation  SUBJECTIVE:                                                                                                                                                                                             SUBJECTIVE STATEMENT:  Pt presents ambulatory with 2WW.  Daughter waits on pt in lobby.  Pt reports fall one day last week then reports not since evaluation, but cannot recall exact day/time of most recent fall.  Pt reports neck pain.  Pt is currently seeing OT at this clinic after her carpal tunnel surgery, is going to get other side done as well but is not scheduled yet.   Pt accompanied by: self and daughter GLENWOOD Joy   PERTINENT HISTORY: PMH: A-fib, CHF, HLD, HTN, MI, pre-diabetes, mild stroke, DDD cervical spine  PAIN:  Are you having pain? Yes: NPRS scale: 7/10 Pain location: neck, down both arms (R worse) Pain description: stiff and sore Aggravating factors: just moving it, laying on my side Relieving factors: heat (sometimes)  PRECAUTIONS: Fall  RED FLAGS: Cervical red flags: Dysphagia No   WEIGHT BEARING RESTRICTIONS: No  FALLS: Has patient fallen in last 6 months? Yes. Number of falls at least 2 falls in the past month, cannot get back up by herself, no injuries with falls so far  LIVING ENVIRONMENT: Lives with: lives with their family and lives with their daughter Lives in: House/apartment Stairs: 2 story house, flight of steps up to bedroom but has stair  lift, threshold to enter Has following equipment at home: Counselling psychologist, Walker - 4 wheeled, and shower chair  Pt always has someone with her; caregiver during the  day and daughter and SIL at night   PLOF: Requires assistive device for independence, Needs assistance with ADLs, Needs assistance with gait, and Needs assistance with transfers  PATIENT GOALS: reduce pain and improve balance  OBJECTIVE:  Note: Objective measures were completed at Evaluation unless otherwise noted.  DIAGNOSTIC FINDINGS:  Brain MRI 12/05/23 IMPRESSION: 1. Small acute ischemic infarcts involving the left insula and overlying left frontal lobe. No associated hemorrhage or mass effect. 2. Underlying atrophy with mild chronic microvascular ischemic disease.  COGNITION: Overall cognitive status: Within functional limits for tasks assessed   SENSATION: N/T in hands and in feet  COORDINATION: Impaired in R and L side  EDEMA:  None in BLE  POSTURE: rounded shoulders, forward head, and posterior pelvic tilt  CERVICAL ROM:   Active ROM A/PROM (deg) eval  Pain  Flexion 28 Yes, hurts in back of neck  Extension 20 Yes, feels like sand in back of neck  Right lateral flexion 20 Yes, pain on R side of neck  Left lateral flexion 20 Yes, pain on L side of neck  Right rotation 58 Yes, both sides  Left rotation 72 Yes, both sides   (Blank rows = not tested)   UPPER EXTREMITY ROM:  Active ROM Right eval Left eval  Shoulder flexion Limited to less than 90 degrees, painful Limited to less than 90 degrees, painful  Shoulder extension    Shoulder abduction Limited to less than 90 degrees, painful Limited to less than 90 degrees, painful  Shoulder adduction    Shoulder extension    Shoulder internal rotation    Shoulder external rotation    Elbow flexion    Elbow extension    Wrist flexion    Wrist extension    Wrist ulnar deviation    Wrist radial deviation    Wrist pronation    Wrist  supination     (Blank rows = not tested)   UPPER EXTREMITY MMT:  MMT Right eval Left eval  Shoulder flexion 2- 3  Shoulder extension    Shoulder abduction 2- 3  Shoulder adduction    Shoulder extension    Shoulder internal rotation    Shoulder external rotation    Middle trapezius    Lower trapezius    Elbow flexion 4 4  Elbow extension 4 4  Wrist flexion    Wrist extension    Wrist ulnar deviation    Wrist radial deviation    Wrist pronation    Wrist supination    Grip strength Slightly decreased Slightly decreased   (Blank rows = not tested)   LOWER EXTREMITY ROM:     Active  Right Eval Left Eval  Hip flexion    Hip extension    Hip abduction    Hip adduction    Hip internal rotation    Hip external rotation    Knee flexion    Knee extension Tight HS Tight HS  Ankle dorsiflexion    Ankle plantarflexion    Ankle inversion    Ankle eversion     (Blank rows = not tested)  LOWER EXTREMITY MMT:    MMT Right Eval Left Eval  Hip flexion 3 4  Hip extension    Hip abduction    Hip adduction    Hip internal rotation    Hip external rotation    Knee flexion 4 5  Knee extension 4 5  Ankle dorsiflexion 4 4  Ankle plantarflexion    Ankle inversion  Ankle eversion    (Blank rows = not tested)  BED MOBILITY:  Does not use AE but has to get assist to get in/out of bed  TRANSFERS: Sit to stand: SBA  Assistive device utilized: Environmental Consultant - 4 wheeled     Stand to sit: SBA  Assistive device utilized: Environmental Consultant - 4 wheeled     Chair to chair: SBA  Assistive device utilized: Environmental Consultant - 4 wheeled       GAIT: Findings:  Gait pattern: path deviation, decreased step length- Right, decreased step length- Left, decreased hip/knee flexion- Right, decreased hip/knee flexion- Left, and trunk flexed Distance walked: various clinic distances Assistive device utilized: Environmental Consultant - 4 wheeled Level of assistance: CGA Comments: pt needs cues for safe rollator use including hand  placement with sit to stands and brake management   FUNCTIONAL TESTS:                                                                                                                        TREATMENT DATE: 02/25/2024 - w/ 2WW:  216 ft SBA -Practice w/ rollator x315 ft, similar stride and step through mechanics to 2WW and good proximity to AD, but pt needs education on widened BOS, safety w/ obstacle management, and brake management.  STS sequencing discussed as well w/ strong encouragement of reach back.  -Chin tucks 2x10 -Cervical retraction 2x10 w/ 2 sec hold -Cervical rotation AROM 2x10 each side -Supine shoulder flexion w/ dowel 2x10 -STS w/ UE support as needed x10  PATIENT EDUCATION: Education details: Pillow support when sleeping.  Rollator safety.  STS sequencing.  Discussed benefits of 2WW vs rollator in regards to RUE grip deficits and rollator brake management. Person educated: Patient Education method: Medical Illustrator Education comprehension: verbalized understanding, returned demonstration, and needs further education  HOME EXERCISE PROGRAM: Access Code: [email or web address] URL: [email or web address] Date: 02/25/2024 Prepared by: Daved Bull  Exercises - Supine Chin Tuck  - 1 x daily - 4 x weekly - 2 sets - 10 reps - Supine Cervical Retraction with Towel  - 1 x daily - 4 x weekly - 2 sets - 10 reps - 2 second hold - Supine Cervical Rotation AROM on Pillow  - 1 x daily - 4 x weekly - 2 sets - 10 reps - Supine Shoulder Flexion with Dowel  - 1 x daily - 4 x weekly - 2 sets - 10 reps - Sit to Stand  - 1 x daily - 7 x weekly - 2 sets - 10 reps  GOALS: Goals reviewed with patient? Yes  SHORT TERM GOALS: Target date: 03/05/2024 Pt will be independent with initial HEP for improved strength, balance, transfers and gait as well as for increased independence with management of her pain symptoms. Baseline: Goal status: INITIAL  2.  Pt will improve 5  x STS to less than or equal to 23 seconds to demonstrate improved functional strength and transfer efficiency.  Baseline: 26.78 sec BUE (1/7) Goal status: INITIAL  3.  Pt will improve gait velocity to at least 1.75 ft/sec for improved gait efficiency and performance at SBA level  Baseline: 1.57 ft/sec with rollator CGA (1/7) Goal status: INITIAL  4.  Pt will improve Berg score to 23/56 for decreased fall risk Baseline: 19/56 (1/7) Goal status: INITIAL  5.  Pt will ambulate>/=316 feet on to demonstrate improved endurance for functional tasks in home and community. Baseline: 216 ft SBA (1/19) Goal status: INITIAL  LONG TERM GOALS: Target date: 04/02/2024 Pt will be independent with final HEP for improved strength, balance, transfers and gait as well as for increased independence with management of her pain symptoms. Baseline:  Goal status: INITIAL  2.  Pt will improve 5 x STS to less than or equal to 20 seconds to demonstrate improved functional strength and transfer efficiency.  Baseline: 26.78 sec BUE (1/7) Goal status: INITIAL  3.  Pt will improve normal TUG to less than or equal to 13 seconds for improved functional mobility and decreased fall risk. Baseline: 14.75 sec with rollator CGA (1/7) Goal status: INITIAL  4.  Pt will improve gait velocity to at least 2.0 ft/sec for improved gait efficiency and performance at SBA level  Baseline: 1.57 ft/sec with rollator CGA (1/7) Goal status: INITIAL  5.  Pt will improve Berg score to 27/56 for decreased fall risk Baseline: 19/56 (1/7) Goal status: INITIAL  6.  2Pt will ambulate>/=416 feet on to demonstrate improved endurance for functional tasks in home and community. Baseline: 216 ft SBA (1/19) Goal status: INITIAL  ASSESSMENT:  CLINICAL IMPRESSION: Patient presents for initial follow-up after evaluation.  She is able to ambulate 216 ft prior to fatigue on .  She is intermittently using 2WW to ambulate in home  and community vs rollator.  Time spent addressing rollator safety especially with brake use.  Provided introductory HEP with pt reporting some increased neck mobility and pain relief at end of session.  Will continue per POC.   OBJECTIVE IMPAIRMENTS: Abnormal gait, cardiopulmonary status limiting activity, decreased activity tolerance, decreased balance, decreased endurance, decreased knowledge of use of DME, difficulty walking, decreased ROM, decreased strength, impaired perceived functional ability, impaired UE functional use, improper body mechanics, postural dysfunction, and pain.   ACTIVITY LIMITATIONS: carrying, lifting, bending, standing, squatting, stairs, transfers, bed mobility, and reach over head  PARTICIPATION LIMITATIONS: meal prep, cleaning, laundry, medication management, driving, and community activity  PERSONAL FACTORS: Age, Time since onset of injury/illness/exacerbation, and 3+ comorbidities:   A-fib, CHF, HLD, HTN, MI, pre-diabetes, mild stroke, DDD cervical spineare also affecting patient's functional outcome.   REHAB POTENTIAL: Fair chronic neck pain, chronic balance impairments, age  CLINICAL DECISION MAKING: Unstable/unpredictable  EVALUATION COMPLEXITY: High  PLAN:  PT FREQUENCY: 2x/week  PT DURATION: 6 weeks  PLANNED INTERVENTIONS: 97164- PT Re-evaluation, 97750- Physical Performance Testing, 97110-Therapeutic exercises, 97530- Therapeutic activity, [id number]- Neuromuscular re-education, 97535- Self Care, [id number]- Manual therapy, [phone]- Gait training, [phone]- Electrical stimulation (manual), [phone] (1-2 muscles), [id number] (3+ muscles)- Dry Needling, Patient/Family education, Balance training, Stair training, Taping, Joint mobilization, Spinal mobilization, DME instructions, Cryotherapy, and Moist heat  PLAN FOR NEXT SESSION: safe rollator use (brake management, where to place hands during transfers), expand HEP to work on chronic neck pain (cervical distract, cervical AROM to  AAROM stretches) and balance and functional strengthening (step taps)   Daved KATHEE Bull, PT, DPT  02/25/2024, 2:47 PM        "

## 2024-02-25 NOTE — Patient Instructions (Signed)
 Access Code: [email or web address] URL: [email or web address] Date: 02/25/2024 Prepared by: Daved Bull  Exercises - Supine Chin Tuck  - 1 x daily - 4 x weekly - 2 sets - 10 reps - Supine Cervical Retraction with Towel  - 1 x daily - 4 x weekly - 2 sets - 10 reps - 2 second hold - Supine Cervical Rotation AROM on Pillow  - 1 x daily - 4 x weekly - 2 sets - 10 reps - Supine Shoulder Flexion with Dowel  - 1 x daily - 4 x weekly - 2 sets - 10 reps - Sit to Stand  - 1 x daily - 7 x weekly - 2 sets - 10 reps

## 2024-02-27 ENCOUNTER — Encounter: Payer: Self-pay | Admitting: Physical Therapy

## 2024-02-27 ENCOUNTER — Ambulatory Visit: Admitting: Physical Therapy

## 2024-02-27 ENCOUNTER — Ambulatory Visit: Admitting: Occupational Therapy

## 2024-02-27 VITALS — BP 118/85 | HR 88

## 2024-02-27 DIAGNOSIS — R29818 Other symptoms and signs involving the nervous system: Secondary | ICD-10-CM

## 2024-02-27 DIAGNOSIS — R29898 Other symptoms and signs involving the musculoskeletal system: Secondary | ICD-10-CM

## 2024-02-27 DIAGNOSIS — M6281 Muscle weakness (generalized): Secondary | ICD-10-CM

## 2024-02-27 DIAGNOSIS — R2689 Other abnormalities of gait and mobility: Secondary | ICD-10-CM

## 2024-02-27 DIAGNOSIS — R278 Other lack of coordination: Secondary | ICD-10-CM

## 2024-02-27 NOTE — Therapy (Unsigned)
 " OUTPATIENT PHYSICAL THERAPY NEURO TREATMENT   Patient Name: RONA TOMSON MRN: [id number] DOB:[birth year], 89 y.o., female Today's Date: 02/28/2024   PCP: Delores Rojelio Caldron, NP REFERRING PROVIDER: Cary No, NP  END OF SESSION:  PT End of Session - 02/27/24 1406     Visit Number 3    Number of Visits 13   with eval   Date for Recertification  04/09/24    Authorization Type [employer] Dual    PT Start Time 1404    PT Stop Time 1442    PT Time Calculation (min) 38 min    Equipment Utilized During Treatment Gait belt    Activity Tolerance Patient tolerated treatment well    Behavior During Therapy WFL for tasks assessed/performed          Past Medical History:  Diagnosis Date   Arthritis    all over   Atrial fibrillation (HCC)    Diagnosed ~2009   Breast cancer, left breast (HCC) 02/07/1996   S/P chemo and mastectomy    CHF (congestive heart failure) (HCC)    GERD (gastroesophageal reflux disease)    Hyperlipidemia    Hypertension    Left shoulder pain    MRI showed pinched nerve - per pt   MI (myocardial infarction) (HCC)    OSA on CPAP    Pneumonia    4-5 times (01/06/2015)   Type II diabetes mellitus (HCC)    Past Surgical History:  Procedure Laterality Date   BIOPSY  07/23/2022   Procedure: BIOPSY;  Surgeon: Kriss Estefana DEL, DO;  Location: [facility];  Service: Gastroenterology;;   BREAST BIOPSY Left 02/07/1996   CARPAL TUNNEL RELEASE Right 2025   CATARACT EXTRACTION W/ INTRAOCULAR LENS  IMPLANT, BILATERAL Bilateral    ESOPHAGOGASTRODUODENOSCOPY N/A 07/23/2022   Procedure: ESOPHAGOGASTRODUODENOSCOPY (EGD);  Surgeon: Kriss Estefana DEL, DO;  Location: [facility];  Service: Gastroenterology;  Laterality: N/A;   JOINT REPLACEMENT     LAPAROSCOPIC CHOLECYSTECTOMY     LEFT HEART CATH AND CORONARY ANGIOGRAPHY N/A 07/07/2020   Procedure: LEFT HEART CATH AND CORONARY ANGIOGRAPHY;  Surgeon: Dann Candyce RAMAN, MD;  Location: [facility]AS[facility];   Service: Cardiovascular;  Laterality: N/A;   MASTECTOMY Left 02/07/1996   TOTAL KNEE ARTHROPLASTY Left 02/07/1999   TOTAL KNEE REVISION Left 01/28/2013   Procedure: LEFT TOTAL KNEE ARTHROPLASTY REVISION;  Surgeon: Kay Ozell Cummins, MD;  Location: [facility];  Service: Orthopedics;  Laterality: Left;   Patient Active Problem List   Diagnosis Date Noted   Acute CVA (cerebrovascular accident) (HCC) 12/05/2023   CHF exacerbation (HCC) 09/12/2020   Hypomagnesemia 09/12/2020   Acute on chronic diastolic CHF (congestive heart failure) (HCC) 09/11/2020   OSA on CPAP    CKD (chronic kidney disease), stage III (HCC)    Anemia    Hypokalemia    Benign essential hypertension 07/15/2020   Malnutrition of moderate degree 07/09/2020   Chest pain of uncertain etiology    Unstable angina (HCC) 07/03/2020   Primary osteoarthritis, right ankle and foot 01/24/2018   Chronic idiopathic constipation 09/28/2017   Arthritis, multiple joint involvement 09/28/2017   Arthritis of right ankle 08/21/2017   Controlled type 2 diabetes mellitus with hyperglycemia, without long-term current use of insulin  (HCC) 08/17/2017   Allergic urticaria 09/07/2016   Generalized pruritus 09/07/2016   Moderate persistent asthma 09/07/2016   Perennial and seasonal allergic rhinitis 09/07/2016   Seasonal allergic rhinitis due to pollen 09/07/2016   History of breast cancer in female 01/12/2016  Chest pain 01/06/2015   Angina at rest 01/06/2015   Diabetes (HCC) 01/06/2015   Persistent atrial fibrillation (HCC) 01/06/2015   HLD (hyperlipidemia) 01/06/2015   Tobacco abuse 01/06/2015   Tobacco dependence syndrome 01/06/2015   Type II diabetes mellitus (HCC)    Painful total knee replacement 01/28/2013   Chronic obstructive asthma (HCC) 06/18/2012   Gastroesophageal reflux disease without esophagitis 09/18/2011   Atrial fibrillation (HCC)    Left shoulder pain    Hypertension     ONSET DATE: 01/08/2024 (referral  date)  REFERRING DIAG: P36.487 (ICD-10-CM) - Acute ischemic left MCA stroke (HCC) M54.2,G89.29 (ICD-10-CM) - Chronic neck pain M48.02 (ICD-10-CM) - Foraminal stenosis of cervical region R26.89 (ICD-10-CM) - Imbalance  THERAPY DIAG:  Muscle weakness (generalized)  Other lack of coordination  Other symptoms and signs involving the musculoskeletal system  Other symptoms and signs involving the nervous system  Other abnormalities of gait and mobility  Rationale for Evaluation and Treatment: Rehabilitation  SUBJECTIVE:                                                                                                                                                                                             SUBJECTIVE STATEMENT:  Pt reports no falls. Brings in her rollator today. Uses her rollator downstairs and her 2WW upstairs. Has tried her exercises, has no questions about them at this time. Feels like they helped her neck    Pt accompanied by: self and aide, Patsy    PERTINENT HISTORY: PMH: A-fib, CHF, HLD, HTN, MI, pre-diabetes, mild stroke, DDD cervical spine  PAIN:  Are you having pain? Yes: NPRS scale: 7-8/10 Pain location: from shoulders down both arms (R worse) Pain description: stiff and sore Aggravating factors: just moving it, laying on my side Relieving factors: heat (sometimes)  PRECAUTIONS: Fall  RED FLAGS: Cervical red flags: Dysphagia No   WEIGHT BEARING RESTRICTIONS: No  FALLS: Has patient fallen in last 6 months? Yes. Number of falls at least 2 falls in the past month, cannot get back up by herself, no injuries with falls so far  LIVING ENVIRONMENT: Lives with: lives with their family and lives with their daughter Lives in: House/apartment Stairs: 2 story house, flight of steps up to bedroom but has stair lift, threshold to enter Has following equipment at home: Counselling psychologist, Environmental Consultant - 4 wheeled, and shower chair  Pt always has someone with  her; caregiver during the day and daughter and SIL at night   PLOF: Requires assistive device for independence, Needs assistance with ADLs, Needs assistance with gait, and Needs assistance with transfers  PATIENT  GOALS: reduce pain and improve balance  OBJECTIVE:  Note: Objective measures were completed at Evaluation unless otherwise noted.  DIAGNOSTIC FINDINGS:  Brain MRI 12/05/23 IMPRESSION: 1. Small acute ischemic infarcts involving the left insula and overlying left frontal lobe. No associated hemorrhage or mass effect. 2. Underlying atrophy with mild chronic microvascular ischemic disease.  COGNITION: Overall cognitive status: Within functional limits for tasks assessed   SENSATION: N/T in hands and in feet  COORDINATION: Impaired in R and L side  EDEMA:  None in BLE  POSTURE: rounded shoulders, forward head, and posterior pelvic tilt  CERVICAL ROM:   Active ROM A/PROM (deg) eval  Pain  Flexion 28 Yes, hurts in back of neck  Extension 20 Yes, feels like sand in back of neck  Right lateral flexion 20 Yes, pain on R side of neck  Left lateral flexion 20 Yes, pain on L side of neck  Right rotation 58 Yes, both sides  Left rotation 72 Yes, both sides   (Blank rows = not tested)   UPPER EXTREMITY ROM:  Active ROM Right eval Left eval  Shoulder flexion Limited to less than 90 degrees, painful Limited to less than 90 degrees, painful  Shoulder extension    Shoulder abduction Limited to less than 90 degrees, painful Limited to less than 90 degrees, painful  Shoulder adduction    Shoulder extension    Shoulder internal rotation    Shoulder external rotation    Elbow flexion    Elbow extension    Wrist flexion    Wrist extension    Wrist ulnar deviation    Wrist radial deviation    Wrist pronation    Wrist supination     (Blank rows = not tested)   UPPER EXTREMITY MMT:  MMT Right eval Left eval  Shoulder flexion 2- 3  Shoulder extension     Shoulder abduction 2- 3  Shoulder adduction    Shoulder extension    Shoulder internal rotation    Shoulder external rotation    Middle trapezius    Lower trapezius    Elbow flexion 4 4  Elbow extension 4 4  Wrist flexion    Wrist extension    Wrist ulnar deviation    Wrist radial deviation    Wrist pronation    Wrist supination    Grip strength Slightly decreased Slightly decreased   (Blank rows = not tested)   LOWER EXTREMITY ROM:     Active  Right Eval Left Eval  Hip flexion    Hip extension    Hip abduction    Hip adduction    Hip internal rotation    Hip external rotation    Knee flexion    Knee extension Tight HS Tight HS  Ankle dorsiflexion    Ankle plantarflexion    Ankle inversion    Ankle eversion     (Blank rows = not tested)  LOWER EXTREMITY MMT:    MMT Right Eval Left Eval  Hip flexion 3 4  Hip extension    Hip abduction    Hip adduction    Hip internal rotation    Hip external rotation    Knee flexion 4 5  Knee extension 4 5  Ankle dorsiflexion 4 4  Ankle plantarflexion    Ankle inversion    Ankle eversion    (Blank rows = not tested)  BED MOBILITY:  Does not use AE but has to get assist to get in/out of bed  TRANSFERS:  Sit to stand: SBA  Assistive device utilized: Environmental Consultant - 4 wheeled     Stand to sit: SBA  Assistive device utilized: Environmental Consultant - 4 wheeled     Chair to chair: SBA  Assistive device utilized: Environmental Consultant - 4 wheeled       GAIT: Findings:  Gait pattern: path deviation, decreased step length- Right, decreased step length- Left, decreased hip/knee flexion- Right, decreased hip/knee flexion- Left, and trunk flexed Distance walked: various clinic distances Assistive device utilized: Walker - 4 wheeled Level of assistance: CGA Comments: pt needs cues for safe rollator use including hand placement with sit to stands and brake management                                                                                                                          TREATMENT DATE: 02/27/24 Therapeutic Activity: Vitals:   02/27/24 1411  BP: 118/85  Pulse: 88  Assessed BP and WFL for therapy.   Needs reminder cues for brake management with rollator throughout session as pt goes from sitting <> standing without locking brakes. Cues to also turn rollator all the way around prior to reaching back and sitting.   Discussed safety in the bathroom as pt ahs to leave her rollator/RW at the door and then walk in while holding onto the countertop. Asked if pt could bring a walker in, but states that she has no room for this. Educated on installing a grab bar into the wall if that would be possible to give pt something sturdy to hold onto. Pt reporting she thinks her family can do that   Lowered height of pt's rollator as it was initially too tall and pt having incr elevated shoulder position with more elbow flexion, pt then ambulated 115' with rollator and did report a decr in pain and that it felt a lot better   10 reps sit <> stands with no UE support, cued for incr forward lean and in standing for tall posture/hip extension, pt with a couple instances of bracing BLE against mat, genu valgum noted  At staircase: Alternating toe taps to first step 10 reps with BUE support, 2 x 10 reps no UE support with CGA/a few episodes of min A for balance 1 set of 10 reps forward step ups with each leg, with UE > fingertip support, performed an addiitonal 5 reps each side with just fingertip support   In corner: Romberg EO 2 x 30 seconds 1/2 tandem with each leg posteriorly 2 x 30 seconds bilat, pt fatigued easily after this    PATIENT EDUCATION: Education details: Continue HEP, Education administrator.  STS sequencing.  Continue HEP  Person educated: Patient Education method: Medical Illustrator Education comprehension: verbalized understanding, returned demonstration, and needs further education  HOME EXERCISE PROGRAM: Access Code:  [email or web address] URL: [email or web address] Date: 02/25/2024 Prepared by: Daved Bull  Exercises - Supine Chin Tuck  - 1 x daily - 4 x weekly - 2 sets - 10  reps - Supine Cervical Retraction with Towel  - 1 x daily - 4 x weekly - 2 sets - 10 reps - 2 second hold - Supine Cervical Rotation AROM on Pillow  - 1 x daily - 4 x weekly - 2 sets - 10 reps - Supine Shoulder Flexion with Dowel  - 1 x daily - 4 x weekly - 2 sets - 10 reps - Sit to Stand  - 1 x daily - 7 x weekly - 2 sets - 10 reps  GOALS: Goals reviewed with patient? Yes  SHORT TERM GOALS: Target date: 03/05/2024 Pt will be independent with initial HEP for improved strength, balance, transfers and gait as well as for increased independence with management of her pain symptoms. Baseline: Goal status: INITIAL  2.  Pt will improve 5 x STS to less than or equal to 23 seconds to demonstrate improved functional strength and transfer efficiency.  Baseline: 26.78 sec BUE (1/7) Goal status: INITIAL  3.  Pt will improve gait velocity to at least 1.75 ft/sec for improved gait efficiency and performance at SBA level  Baseline: 1.57 ft/sec with rollator CGA (1/7) Goal status: INITIAL  4.  Pt will improve Berg score to 23/56 for decreased fall risk Baseline: 19/56 (1/7) Goal status: INITIAL  5.  Pt will ambulate>/=316 feet on to demonstrate improved endurance for functional tasks in home and community. Baseline: 216 ft SBA (1/19) Goal status: INITIAL  LONG TERM GOALS: Target date: 04/02/2024 Pt will be independent with final HEP for improved strength, balance, transfers and gait as well as for increased independence with management of her pain symptoms. Baseline:  Goal status: INITIAL  2.  Pt will improve 5 x STS to less than or equal to 20 seconds to demonstrate improved functional strength and transfer efficiency.  Baseline: 26.78 sec BUE (1/7) Goal status: INITIAL  3.  Pt will improve normal TUG to less than or  equal to 13 seconds for improved functional mobility and decreased fall risk. Baseline: 14.75 sec with rollator CGA (1/7) Goal status: INITIAL  4.  Pt will improve gait velocity to at least 2.0 ft/sec for improved gait efficiency and performance at SBA level  Baseline: 1.57 ft/sec with rollator CGA (1/7) Goal status: INITIAL  5.  Pt will improve Berg score to 27/56 for decreased fall risk Baseline: 19/56 (1/7) Goal status: INITIAL  6.  2Pt will ambulate>/=416 feet on to demonstrate improved endurance for functional tasks in home and community. Baseline: 216 ft SBA (1/19) Goal status: INITIAL  ASSESSMENT:  CLINICAL IMPRESSION: Today's skilled session focused on safety with rollator, esp proper brake management with pt needing reminder cues for safety throughout. Also lowered height of rollator to a better position for pt which she reported felt better for her shoulders. Remainder of session focused on functional strength, and balance with SLS/narrow BOS. Pt fatigued easily after performing static balance for narrow BOS. Will continue per POC.     OBJECTIVE IMPAIRMENTS: Abnormal gait, cardiopulmonary status limiting activity, decreased activity tolerance, decreased balance, decreased endurance, decreased knowledge of use of DME, difficulty walking, decreased ROM, decreased strength, impaired perceived functional ability, impaired UE functional use, improper body mechanics, postural dysfunction, and pain.   ACTIVITY LIMITATIONS: carrying, lifting, bending, standing, squatting, stairs, transfers, bed mobility, and reach over head  PARTICIPATION LIMITATIONS: meal prep, cleaning, laundry, medication management, driving, and community activity  PERSONAL FACTORS: Age, Time since onset of injury/illness/exacerbation, and 3+ comorbidities:   A-fib, CHF, HLD, HTN, MI,  pre-diabetes, mild stroke, DDD cervical spineare also affecting patient's functional outcome.   REHAB POTENTIAL: Fair chronic  neck pain, chronic balance impairments, age  CLINICAL DECISION MAKING: Unstable/unpredictable  EVALUATION COMPLEXITY: High  PLAN:  PT FREQUENCY: 2x/week  PT DURATION: 6 weeks  PLANNED INTERVENTIONS: 97164- PT Re-evaluation, 97750- Physical Performance Testing, 97110-Therapeutic exercises, 97530- Therapeutic activity, [id number]- Neuromuscular re-education, 97535- Self Care, [id number]- Manual therapy, [phone]- Gait training, [phone]- Electrical stimulation (manual), [phone] (1-2 muscles), [id number] (3+ muscles)- Dry Needling, Patient/Family education, Balance training, Stair training, Taping, Joint mobilization, Spinal mobilization, DME instructions, Cryotherapy, and Moist heat  PLAN FOR NEXT SESSION: STGs due next week   safe rollator use (brake management, where to place hands during transfers), add to HEP as needed for balance/strength  and balance and functional strengthening (step taps), hip strengthening    [place] LOISE Senate, PT, DPT  02/28/2024, 7:45 AM        "

## 2024-03-03 ENCOUNTER — Ambulatory Visit: Admitting: Physical Therapy

## 2024-03-05 ENCOUNTER — Ambulatory Visit: Admitting: Physical Therapy

## 2024-03-05 NOTE — Therapy (Incomplete)
 " OUTPATIENT PHYSICAL THERAPY NEURO TREATMENT   Patient Name: [name] MRN: [id number] DOB:[birth year], 89 y.o., female Today's Date: 03/05/2024   PCP: Delores Rojelio Caldron, NP REFERRING PROVIDER: Cary No, NP  END OF SESSION:    Past Medical History:  Diagnosis Date   Arthritis    all over   Atrial fibrillation (HCC)    Diagnosed ~2009   Breast cancer, left breast (HCC) 02/07/1996   S/P chemo and mastectomy    CHF (congestive heart failure) (HCC)    GERD (gastroesophageal reflux disease)    Hyperlipidemia    Hypertension    Left shoulder pain    MRI showed pinched nerve - per pt   MI (myocardial infarction) (HCC)    OSA on CPAP    Pneumonia    4-5 times (01/06/2015)   Type II diabetes mellitus (HCC)    Past Surgical History:  Procedure Laterality Date   BIOPSY  07/23/2022   Procedure: BIOPSY;  Surgeon: Kriss Estefana DEL, DO;  Location: [facility];  Service: Gastroenterology;;   BREAST BIOPSY Left 02/07/1996   CARPAL TUNNEL RELEASE Right 2025   CATARACT EXTRACTION W/ INTRAOCULAR LENS  IMPLANT, BILATERAL Bilateral    ESOPHAGOGASTRODUODENOSCOPY N/A 07/23/2022   Procedure: ESOPHAGOGASTRODUODENOSCOPY (EGD);  Surgeon: Kriss Estefana DEL, DO;  Location: [facility];  Service: Gastroenterology;  Laterality: N/A;   JOINT REPLACEMENT     LAPAROSCOPIC CHOLECYSTECTOMY     LEFT HEART CATH AND CORONARY ANGIOGRAPHY N/A 07/07/2020   Procedure: LEFT HEART CATH AND CORONARY ANGIOGRAPHY;  Surgeon: Dann Candyce RAMAN, MD;  Location: [facility]IVE CV [facility];  Service: Cardiovascular;  Laterality: N/A;   MASTECTOMY Left 02/07/1996   TOTAL KNEE ARTHROPLASTY Left 02/07/1999   TOTAL KNEE REVISION Left 01/28/2013   Procedure: LEFT TOTAL KNEE ARTHROPLASTY REVISION;  Surgeon: Kay Ozell Cummins, MD;  Location: [facility] OR;  Service: Orthopedics;  Laterality: Left;   Patient Active Problem List   Diagnosis Date Noted   Acute CVA (cerebrovascular accident) (HCC) 12/05/2023   CHF  exacerbation (HCC) 09/12/2020   Hypomagnesemia 09/12/2020   Acute on chronic diastolic CHF (congestive heart failure) (HCC) 09/11/2020   OSA on CPAP    CKD (chronic kidney disease), stage III (HCC)    Anemia    Hypokalemia    Benign essential hypertension 07/15/2020   Malnutrition of moderate degree 07/09/2020   Chest pain of uncertain etiology    Unstable angina (HCC) 07/03/2020   Primary osteoarthritis, right ankle and foot 01/24/2018   Chronic idiopathic constipation 09/28/2017   Arthritis, multiple joint involvement 09/28/2017   Arthritis of right ankle 08/21/2017   Controlled type 2 diabetes mellitus with hyperglycemia, without long-term current use of insulin  (HCC) 08/17/2017   Allergic urticaria 09/07/2016   Generalized pruritus 09/07/2016   Moderate persistent asthma 09/07/2016   Perennial and seasonal allergic rhinitis 09/07/2016   Seasonal allergic rhinitis due to pollen 09/07/2016   History of breast cancer in female 01/12/2016   Chest pain 01/06/2015   Angina at rest 01/06/2015   Diabetes (HCC) 01/06/2015   Persistent atrial fibrillation (HCC) 01/06/2015   HLD (hyperlipidemia) 01/06/2015   Tobacco abuse 01/06/2015   Tobacco dependence syndrome 01/06/2015   Type II diabetes mellitus (HCC)    Painful total knee replacement 01/28/2013   Chronic obstructive asthma (HCC) 06/18/2012   Gastroesophageal reflux disease without esophagitis 09/18/2011   Atrial fibrillation (HCC)    Left shoulder pain    Hypertension     ONSET DATE: 01/08/2024 (referral date)  REFERRING DIAG: P36.487 (  ICD-10-CM) - Acute ischemic left MCA stroke (HCC) M54.2,G89.29 (ICD-10-CM) - Chronic neck pain M48.02 (ICD-10-CM) - Foraminal stenosis of cervical region R26.89 (ICD-10-CM) - Imbalance  THERAPY DIAG:  No diagnosis found.  Rationale for Evaluation and Treatment: Rehabilitation  SUBJECTIVE:                                                                                                                                                                                              SUBJECTIVE STATEMENT:  Pt reports no falls. Brings in her rollator today. Uses her rollator downstairs and her 2WW upstairs. Has tried her exercises, has no questions about them at this time. Feels like they helped her neck   ***   Pt accompanied by: self and aide, Patsy    PERTINENT HISTORY: PMH: A-fib, CHF, HLD, HTN, MI, pre-diabetes, mild stroke, DDD cervical spine  PAIN:  Are you having pain? Yes: NPRS scale: 7-8/10 Pain location: from shoulders down both arms (R worse) Pain description: stiff and sore Aggravating factors: just moving it, laying on my side Relieving factors: heat (sometimes)  PRECAUTIONS: Fall  RED FLAGS: Cervical red flags: Dysphagia No   WEIGHT BEARING RESTRICTIONS: No  FALLS: Has patient fallen in last 6 months? Yes. Number of falls at least 2 falls in the past month, cannot get back up by herself, no injuries with falls so far  LIVING ENVIRONMENT: Lives with: lives with their family and lives with their daughter Lives in: House/apartment Stairs: 2 story house, flight of steps up to bedroom but has stair lift, threshold to enter Has following equipment at home: Counselling psychologist, Environmental Consultant - 4 wheeled, and shower chair  Pt always has someone with her; caregiver during the day and daughter and SIL at night   PLOF: Requires assistive device for independence, Needs assistance with ADLs, Needs assistance with gait, and Needs assistance with transfers  PATIENT GOALS: reduce pain and improve balance  OBJECTIVE:  Note: Objective measures were completed at Evaluation unless otherwise noted.  DIAGNOSTIC FINDINGS:  Brain MRI 12/05/23 IMPRESSION: 1. Small acute ischemic infarcts involving the left insula and overlying left frontal lobe. No associated hemorrhage or mass effect. 2. Underlying atrophy with mild chronic microvascular ischemic  disease.  COGNITION: Overall cognitive status: Within functional limits for tasks assessed   SENSATION: N/T in hands and in feet  COORDINATION: Impaired in R and L side  EDEMA:  None in BLE  POSTURE: rounded shoulders, forward head, and posterior pelvic tilt  CERVICAL ROM:   Active ROM A/PROM (deg) eval  Pain  Flexion 28 Yes, hurts in back of neck  Extension  20 Yes, feels like sand in back of neck  Right lateral flexion 20 Yes, pain on R side of neck  Left lateral flexion 20 Yes, pain on L side of neck  Right rotation 58 Yes, both sides  Left rotation 72 Yes, both sides   (Blank rows = not tested)   UPPER EXTREMITY ROM:  Active ROM Right eval Left eval  Shoulder flexion Limited to less than 90 degrees, painful Limited to less than 90 degrees, painful  Shoulder extension    Shoulder abduction Limited to less than 90 degrees, painful Limited to less than 90 degrees, painful  Shoulder adduction    Shoulder extension    Shoulder internal rotation    Shoulder external rotation    Elbow flexion    Elbow extension    Wrist flexion    Wrist extension    Wrist ulnar deviation    Wrist radial deviation    Wrist pronation    Wrist supination     (Blank rows = not tested)   UPPER EXTREMITY MMT:  MMT Right eval Left eval  Shoulder flexion 2- 3  Shoulder extension    Shoulder abduction 2- 3  Shoulder adduction    Shoulder extension    Shoulder internal rotation    Shoulder external rotation    Middle trapezius    Lower trapezius    Elbow flexion 4 4  Elbow extension 4 4  Wrist flexion    Wrist extension    Wrist ulnar deviation    Wrist radial deviation    Wrist pronation    Wrist supination    Grip strength Slightly decreased Slightly decreased   (Blank rows = not tested)   LOWER EXTREMITY ROM:     Active  Right Eval Left Eval  Hip flexion    Hip extension    Hip abduction    Hip adduction    Hip internal rotation    Hip external rotation     Knee flexion    Knee extension Tight HS Tight HS  Ankle dorsiflexion    Ankle plantarflexion    Ankle inversion    Ankle eversion     (Blank rows = not tested)  LOWER EXTREMITY MMT:    MMT Right Eval Left Eval  Hip flexion 3 4  Hip extension    Hip abduction    Hip adduction    Hip internal rotation    Hip external rotation    Knee flexion 4 5  Knee extension 4 5  Ankle dorsiflexion 4 4  Ankle plantarflexion    Ankle inversion    Ankle eversion    (Blank rows = not tested)  BED MOBILITY:  Does not use AE but has to get assist to get in/out of bed  TRANSFERS: Sit to stand: SBA  Assistive device utilized: Environmental Consultant - 4 wheeled     Stand to sit: SBA  Assistive device utilized: Environmental Consultant - 4 wheeled     Chair to chair: SBA  Assistive device utilized: Environmental Consultant - 4 wheeled       GAIT: Findings:  Gait pattern: path deviation, decreased step length- Right, decreased step length- Left, decreased hip/knee flexion- Right, decreased hip/knee flexion- Left, and trunk flexed Distance walked: various clinic distances Assistive device utilized: Walker - 4 wheeled Level of assistance: CGA Comments: pt needs cues for safe rollator use including hand placement with sit to stands and brake management  TREATMENT DATE: 03/05/24 Self-Care/Home Management There were no vitals filed for this visit. Assessed BP and WFL for therapy. ***  Needs reminder cues for brake management with rollator throughout session as pt goes from sitting <> standing without locking brakes. Cues to also turn rollator all the way around prior to reaching back and sitting.   TherAct ***   PATIENT EDUCATION: Education details: Continue HEP, Rollator safety.  STS sequencing.  Continue HEP *** Person educated: Patient Education method: Medical Illustrator Education comprehension: verbalized  understanding, returned demonstration, and needs further education  HOME EXERCISE PROGRAM: Access Code: [email or web address] URL: [email or web address] Date: 02/25/2024 Prepared by: Daved Bull  Exercises - Supine Chin Tuck  - 1 x daily - 4 x weekly - 2 sets - 10 reps - Supine Cervical Retraction with Towel  - 1 x daily - 4 x weekly - 2 sets - 10 reps - 2 second hold - Supine Cervical Rotation AROM on Pillow  - 1 x daily - 4 x weekly - 2 sets - 10 reps - Supine Shoulder Flexion with Dowel  - 1 x daily - 4 x weekly - 2 sets - 10 reps - Sit to Stand  - 1 x daily - 7 x weekly - 2 sets - 10 reps  GOALS: Goals reviewed with patient? Yes  SHORT TERM GOALS: Target date: 03/05/2024*** Pt will be independent with initial HEP for improved strength, balance, transfers and gait as well as for increased independence with management of her pain symptoms. Baseline: Goal status: INITIAL  2.  Pt will improve 5 x STS to less than or equal to 23 seconds to demonstrate improved functional strength and transfer efficiency.  Baseline: 26.78 sec BUE (1/7) Goal status: INITIAL  3.  Pt will improve gait velocity to at least 1.75 ft/sec for improved gait efficiency and performance at SBA level  Baseline: 1.57 ft/sec with rollator CGA (1/7) Goal status: INITIAL  4.  Pt will improve Berg score to 23/56 for decreased fall risk Baseline: 19/56 (1/7) Goal status: INITIAL  5.  Pt will ambulate>/=316 feet on to demonstrate improved endurance for functional tasks in home and community. Baseline: 216 ft SBA (1/19) Goal status: INITIAL  LONG TERM GOALS: Target date: 04/02/2024 Pt will be independent with final HEP for improved strength, balance, transfers and gait as well as for increased independence with management of her pain symptoms. Baseline:  Goal status: INITIAL  2.  Pt will improve 5 x STS to less than or equal to 20 seconds to demonstrate improved functional strength and transfer  efficiency.  Baseline: 26.78 sec BUE (1/7) Goal status: INITIAL  3.  Pt will improve normal TUG to less than or equal to 13 seconds for improved functional mobility and decreased fall risk. Baseline: 14.75 sec with rollator CGA (1/7) Goal status: INITIAL  4.  Pt will improve gait velocity to at least 2.0 ft/sec for improved gait efficiency and performance at SBA level  Baseline: 1.57 ft/sec with rollator CGA (1/7) Goal status: INITIAL  5.  Pt will improve Berg score to 27/56 for decreased fall risk Baseline: 19/56 (1/7) Goal status: INITIAL  6.  2Pt will ambulate>/=416 feet on to demonstrate improved endurance for functional tasks in home and community. Baseline: 216 ft SBA (1/19) Goal status: INITIAL  ASSESSMENT:  CLINICAL IMPRESSION: Today's skilled session focused on safety with rollator, esp proper brake management with pt needing reminder cues for safety throughout. Also lowered height of rollator to a better position  for pt which she reported felt better for her shoulders. Remainder of session focused on functional strength, and balance with SLS/narrow BOS. Pt fatigued easily after performing static balance for narrow BOS. Will continue per POC.   Emphasis of skilled PT session*** Continue POC.     OBJECTIVE IMPAIRMENTS: Abnormal gait, cardiopulmonary status limiting activity, decreased activity tolerance, decreased balance, decreased endurance, decreased knowledge of use of DME, difficulty walking, decreased ROM, decreased strength, impaired perceived functional ability, impaired UE functional use, improper body mechanics, postural dysfunction, and pain.   ACTIVITY LIMITATIONS: carrying, lifting, bending, standing, squatting, stairs, transfers, bed mobility, and reach over head  PARTICIPATION LIMITATIONS: meal prep, cleaning, laundry, medication management, driving, and community activity  PERSONAL FACTORS: Age, Time since onset of injury/illness/exacerbation, and 3+  comorbidities:   A-fib, CHF, HLD, HTN, MI, pre-diabetes, mild stroke, DDD cervical spineare also affecting patient's functional outcome.   REHAB POTENTIAL: Fair chronic neck pain, chronic balance impairments, age  CLINICAL DECISION MAKING: Unstable/unpredictable  EVALUATION COMPLEXITY: High  PLAN:  PT FREQUENCY: 2x/week  PT DURATION: 6 weeks  PLANNED INTERVENTIONS: 97164- PT Re-evaluation, 97750- Physical Performance Testing, 97110-Therapeutic exercises, 97530- Therapeutic activity, [id number]- Neuromuscular re-education, 97535- Self Care, [id number]- Manual therapy, [phone]- Gait training, [phone]- Electrical stimulation (manual), [phone] (1-2 muscles), [id number] (3+ muscles)- Dry Needling, Patient/Family education, Balance training, Stair training, Taping, Joint mobilization, Spinal mobilization, DME instructions, Cryotherapy, and Moist heat  PLAN FOR NEXT SESSION: STGs due next week   safe rollator use (brake management, where to place hands during transfers), add to HEP as needed for balance/strength  and balance and functional strengthening (step taps), hip strengthening ***   Waddell Southgate, PT Waddell Southgate, PT, DPT, CSRS   03/05/2024, 7:43 AM        "

## 2024-03-10 ENCOUNTER — Ambulatory Visit: Admitting: Physical Therapy

## 2024-03-12 ENCOUNTER — Ambulatory Visit: Admitting: Physical Therapy

## 2024-03-12 VITALS — BP 132/62 | HR 85

## 2024-03-12 DIAGNOSIS — R2689 Other abnormalities of gait and mobility: Secondary | ICD-10-CM

## 2024-03-12 DIAGNOSIS — R29898 Other symptoms and signs involving the musculoskeletal system: Secondary | ICD-10-CM

## 2024-03-12 DIAGNOSIS — M6281 Muscle weakness (generalized): Secondary | ICD-10-CM

## 2024-03-12 DIAGNOSIS — R29818 Other symptoms and signs involving the nervous system: Secondary | ICD-10-CM

## 2024-03-12 NOTE — Therapy (Signed)
 " OUTPATIENT PHYSICAL THERAPY NEURO TREATMENT   Patient Name: [name] MRN: [id number] DOB:1934-01-09, 89 y.o., female Today's Date: 03/12/2024   PCP: Delores Rojelio Caldron, NP REFERRING PROVIDER: Cary No, NP  END OF SESSION:  PT End of Session - 03/12/24 1408     Visit Number 4    Number of Visits 13   with eval   Date for Recertification  04/09/24    Authorization Type [employer] Dual    PT Start Time 1406   pt arrived late   PT Stop Time 1446    PT Time Calculation (min) 40 min    Equipment Utilized During Treatment Gait belt    Activity Tolerance Patient tolerated treatment well    Behavior During Therapy WFL for tasks assessed/performed           Past Medical History:  Diagnosis Date   Arthritis    all over   Atrial fibrillation (HCC)    Diagnosed ~2009   Breast cancer, left breast (HCC) 02/07/1996   S/P chemo and mastectomy    CHF (congestive heart failure) (HCC)    GERD (gastroesophageal reflux disease)    Hyperlipidemia    Hypertension    Left shoulder pain    MRI showed pinched nerve - per pt   MI (myocardial infarction) (HCC)    OSA on CPAP    Pneumonia    4-5 times (01/06/2015)   Type II diabetes mellitus (HCC)    Past Surgical History:  Procedure Laterality Date   BIOPSY  07/23/2022   Procedure: BIOPSY;  Surgeon: Kriss Estefana DEL, DO;  Location: [facility];  Service: Gastroenterology;;   BREAST BIOPSY Left 02/07/1996   CARPAL TUNNEL RELEASE Right 2025   CATARACT EXTRACTION W/ INTRAOCULAR LENS  IMPLANT, BILATERAL Bilateral    ESOPHAGOGASTRODUODENOSCOPY N/A 07/23/2022   Procedure: ESOPHAGOGASTRODUODENOSCOPY (EGD);  Surgeon: Kriss Estefana DEL, DO;  Location: [facility];  Service: Gastroenterology;  Laterality: N/A;   JOINT REPLACEMENT     LAPAROSCOPIC CHOLECYSTECTOMY     LEFT HEART CATH AND CORONARY ANGIOGRAPHY N/A 07/07/2020   Procedure: LEFT HEART CATH AND CORONARY ANGIOGRAPHY;  Surgeon: Dann Candyce RAMAN, MD;  Location: [facility]  INVASIVE CV LAB;  Service: Cardiovascular;  Laterality: N/A;   MASTECTOMY Left 02/07/1996   TOTAL KNEE ARTHROPLASTY Left 02/07/1999   TOTAL KNEE REVISION Left 01/28/2013   Procedure: LEFT TOTAL KNEE ARTHROPLASTY REVISION;  Surgeon: Kay Ozell Cummins, MD;  Location: [facility];  Service: Orthopedics;  Laterality: Left;   Patient Active Problem List   Diagnosis Date Noted   Acute CVA (cerebrovascular accident) (HCC) 12/05/2023   CHF exacerbation (HCC) 09/12/2020   Hypomagnesemia 09/12/2020   Acute on chronic diastolic CHF (congestive heart failure) (HCC) 09/11/2020   OSA on CPAP    CKD (chronic kidney disease), stage III (HCC)    Anemia    Hypokalemia    Benign essential hypertension 07/15/2020   Malnutrition of moderate degree 07/09/2020   Chest pain of uncertain etiology    Unstable angina (HCC) 07/03/2020   Primary osteoarthritis, right ankle and foot 01/24/2018   Chronic idiopathic constipation 09/28/2017   Arthritis, multiple joint involvement 09/28/2017   Arthritis of right ankle 08/21/2017   Controlled type 2 diabetes mellitus with hyperglycemia, without long-term current use of insulin  (HCC) 08/17/2017   Allergic urticaria 09/07/2016   Generalized pruritus 09/07/2016   Moderate persistent asthma 09/07/2016   Perennial and seasonal allergic rhinitis 09/07/2016   Seasonal allergic rhinitis due to pollen 09/07/2016   History of  breast cancer in female 01/12/2016   Chest pain 01/06/2015   Angina at rest 01/06/2015   Diabetes (HCC) 01/06/2015   Persistent atrial fibrillation (HCC) 01/06/2015   HLD (hyperlipidemia) 01/06/2015   Tobacco abuse 01/06/2015   Tobacco dependence syndrome 01/06/2015   Type II diabetes mellitus (HCC)    Painful total knee replacement 01/28/2013   Chronic obstructive asthma (HCC) 06/18/2012   Gastroesophageal reflux disease without esophagitis 09/18/2011   Atrial fibrillation (HCC)    Left shoulder pain    Hypertension     ONSET DATE: 01/08/2024  (referral date)  REFERRING DIAG: P36.487 (ICD-10-CM) - Acute ischemic left MCA stroke (HCC) M54.2,G89.29 (ICD-10-CM) - Chronic neck pain M48.02 (ICD-10-CM) - Foraminal stenosis of cervical region R26.89 (ICD-10-CM) - Imbalance  THERAPY DIAG:  Muscle weakness (generalized)  Other symptoms and signs involving the musculoskeletal system  Other symptoms and signs involving the nervous system  Other abnormalities of gait and mobility  Rationale for Evaluation and Treatment: Rehabilitation  SUBJECTIVE:                                                                                                                                                                                             SUBJECTIVE STATEMENT:  Pt reports no falls or other acute changes since last visit. Pt reports mild pain in her R upper arm today.  Pt reports her HEP is going well, no questions. Her neck pain has improved, still needs to work on her balance.   Pt accompanied by: self and son in lobby    PERTINENT HISTORY: PMH: A-fib, CHF, HLD, HTN, MI, pre-diabetes, mild stroke, DDD cervical spine  PAIN:  Are you having pain? Yes: NPRS scale: 7-8/10 Pain location: from shoulders down both arms (R worse) Pain description: stiff and sore Aggravating factors: just moving it, laying on my side Relieving factors: heat (sometimes)  PRECAUTIONS: Fall  RED FLAGS: Cervical red flags: Dysphagia No   WEIGHT BEARING RESTRICTIONS: No  FALLS: Has patient fallen in last 6 months? Yes. Number of falls at least 2 falls in the past month, cannot get back up by herself, no injuries with falls so far  LIVING ENVIRONMENT: Lives with: lives with their family and lives with their daughter Lives in: House/apartment Stairs: 2 story house, flight of steps up to bedroom but has stair lift, threshold to enter Has following equipment at home: Counselling psychologist, Environmental Consultant - 4 wheeled, and shower chair  Pt always has someone with  her; caregiver during the day and daughter and SIL at night   PLOF: Requires assistive device for independence, Needs assistance with ADLs, Needs  assistance with gait, and Needs assistance with transfers  PATIENT GOALS: reduce pain and improve balance  OBJECTIVE:  Note: Objective measures were completed at Evaluation unless otherwise noted.  DIAGNOSTIC FINDINGS:  Brain MRI 12/05/23 IMPRESSION: 1. Small acute ischemic infarcts involving the left insula and overlying left frontal lobe. No associated hemorrhage or mass effect. 2. Underlying atrophy with mild chronic microvascular ischemic disease.  COGNITION: Overall cognitive status: Within functional limits for tasks assessed   SENSATION: N/T in hands and in feet  COORDINATION: Impaired in R and L side  EDEMA:  None in BLE  POSTURE: rounded shoulders, forward head, and posterior pelvic tilt  CERVICAL ROM:   Active ROM A/PROM (deg) eval  Pain  Flexion 28 Yes, hurts in back of neck  Extension 20 Yes, feels like sand in back of neck  Right lateral flexion 20 Yes, pain on R side of neck  Left lateral flexion 20 Yes, pain on L side of neck  Right rotation 58 Yes, both sides  Left rotation 72 Yes, both sides   (Blank rows = not tested)   UPPER EXTREMITY ROM:  Active ROM Right eval Left eval  Shoulder flexion Limited to less than 90 degrees, painful Limited to less than 90 degrees, painful  Shoulder extension    Shoulder abduction Limited to less than 90 degrees, painful Limited to less than 90 degrees, painful  Shoulder adduction    Shoulder extension    Shoulder internal rotation    Shoulder external rotation    Elbow flexion    Elbow extension    Wrist flexion    Wrist extension    Wrist ulnar deviation    Wrist radial deviation    Wrist pronation    Wrist supination     (Blank rows = not tested)   UPPER EXTREMITY MMT:  MMT Right eval Left eval  Shoulder flexion 2- 3  Shoulder extension     Shoulder abduction 2- 3  Shoulder adduction    Shoulder extension    Shoulder internal rotation    Shoulder external rotation    Middle trapezius    Lower trapezius    Elbow flexion 4 4  Elbow extension 4 4  Wrist flexion    Wrist extension    Wrist ulnar deviation    Wrist radial deviation    Wrist pronation    Wrist supination    Grip strength Slightly decreased Slightly decreased   (Blank rows = not tested)   LOWER EXTREMITY ROM:     Active  Right Eval Left Eval  Hip flexion    Hip extension    Hip abduction    Hip adduction    Hip internal rotation    Hip external rotation    Knee flexion    Knee extension Tight HS Tight HS  Ankle dorsiflexion    Ankle plantarflexion    Ankle inversion    Ankle eversion     (Blank rows = not tested)  LOWER EXTREMITY MMT:    MMT Right Eval Left Eval  Hip flexion 3 4  Hip extension    Hip abduction    Hip adduction    Hip internal rotation    Hip external rotation    Knee flexion 4 5  Knee extension 4 5  Ankle dorsiflexion 4 4  Ankle plantarflexion    Ankle inversion    Ankle eversion    (Blank rows = not tested)  BED MOBILITY:  Does not use AE but has  to get assist to get in/out of bed  TRANSFERS: Sit to stand: SBA  Assistive device utilized: Environmental Consultant - 4 wheeled     Stand to sit: SBA  Assistive device utilized: Environmental Consultant - 4 wheeled     Chair to chair: SBA  Assistive device utilized: Environmental Consultant - 4 wheeled       GAIT: Findings:  Gait pattern: path deviation, decreased step length- Right, decreased step length- Left, decreased hip/knee flexion- Right, decreased hip/knee flexion- Left, and trunk flexed Distance walked: various clinic distances Assistive device utilized: Environmental Consultant - 4 wheeled Level of assistance: CGA Comments: pt needs cues for safe rollator use including hand placement with sit to stands and brake management    [facility] PT Assessment - 03/12/24 1417       Ambulation/Gait   Gait velocity 32.8 ft over  14.78 sec = 2.22 ft/sec   with rollator     Standardized Balance Assessment   Standardized Balance Assessment Timed Up and Go Test;Five Times Sit to Stand;Berg Balance Test    Five times sit to stand comments  24.88 sec   no UE     Berg Balance Test   Sit to Stand Able to stand without using hands and stabilize independently    Standing Unsupported Able to stand 2 minutes with supervision    Sitting with Back Unsupported but Feet Supported on Floor or Stool Able to sit safely and securely 2 minutes    Stand to Sit Sits safely with minimal use of hands    Transfers Able to transfer safely, definite need of hands    Standing Unsupported with Eyes Closed Needs help to keep from falling    Standing Unsupported with Feet Together Needs help to attain position but able to stand for 30 seconds with feet together    From Standing, Reach Forward with Outstretched Arm Reaches forward but needs supervision    From Standing Position, Pick up Object from Floor Unable to try/needs assist to keep balance    From Standing Position, Turn to Look Behind Over each Shoulder Needs supervision when turning    Turn 360 Degrees Needs close supervision or verbal cueing    Standing Unsupported, Alternately Place Feet on Step/Stool Able to complete >2 steps/needs minimal assist    Standing Unsupported, One Foot in Front Loses balance while stepping or standing    Standing on One Leg Tries to lift leg/unable to hold 3 seconds but remains standing independently    Total Score 24    Berg comment: 24/56, high fall risk                                                                                                                              TREATMENT DATE: 03/12/24  Self-Care/Home Management Vitals:   03/12/24 1412  BP: 132/62  Pulse: 85   Assessed BP and WFL for therapy.   Needs reminder cues for brake management with rollator throughout session as  pt goes from sitting <> standing without locking brakes. Cues  to also turn rollator all the way around prior to reaching back and sitting. Pt could benefit from having a printed sign placed on her rollator next visit to remind her how to lock/unlock her brakes.   TherAct To work on addressing static standing balance impairments in corner with chair in front of patient: Romberg stance with EO 3 x 30 sec Wide stance with EC 3 x 15-20 sec before LOB Wide L/R tandem with EO 3 x 30 sec each  Added appropriate exercises to HEP. Encouraged patient to perform these exercises in a corner of her home with a sturdy chair in front of her as she did in the clinic AND to have family present for Supervision while she does these exercises.    Physical Performance For STG assessment:  [facility] PT Assessment - 03/12/24 1417       Ambulation/Gait   Gait velocity 32.8 ft over 14.78 sec = 2.22 ft/sec   with rollator     Standardized Balance Assessment   Standardized Balance Assessment Timed Up and Go Test;Five Times Sit to Stand;Berg Balance Test    Five times sit to stand comments  24.88 sec   no UE     Berg Balance Test   Sit to Stand Able to stand without using hands and stabilize independently    Standing Unsupported Able to stand 2 minutes with supervision    Sitting with Back Unsupported but Feet Supported on Floor or Stool Able to sit safely and securely 2 minutes    Stand to Sit Sits safely with minimal use of hands    Transfers Able to transfer safely, definite need of hands    Standing Unsupported with Eyes Closed Needs help to keep from falling    Standing Unsupported with Feet Together Needs help to attain position but able to stand for 30 seconds with feet together    From Standing, Reach Forward with Outstretched Arm Reaches forward but needs supervision    From Standing Position, Pick up Object from Floor Unable to try/needs assist to keep balance    From Standing Position, Turn to Look Behind Over each Shoulder Needs supervision when turning     Turn 360 Degrees Needs close supervision or verbal cueing    Standing Unsupported, Alternately Place Feet on Step/Stool Able to complete >2 steps/needs minimal assist    Standing Unsupported, One Foot in [employer] balance while stepping or standing    Standing on One Leg Tries to lift leg/unable to hold 3 seconds but remains standing independently    Total Score 24    Berg comment: 24/56, high fall risk         : 380 ft with rollator, 0/10 RPE   PATIENT EDUCATION: Education details: Continue HEP and added balance exercises to HEP, Rollator safety.  Results of OM and functional implications Person educated: Patient Education method: Explanation, Demonstration, Tactile cues, Verbal cues, and Handouts Education comprehension: verbalized understanding, returned demonstration, and needs further education  HOME EXERCISE PROGRAM: Access Code: [email or web address] URL: [email or web address] Date: 02/25/2024 Prepared by: Daved Bull  Exercises - Supine Chin Tuck  - 1 x daily - 4 x weekly - 2 sets - 10 reps - Supine Cervical Retraction with Towel  - 1 x daily - 4 x weekly - 2 sets - 10 reps - 2 second hold - Supine Cervical Rotation AROM on Pillow  - 1 x daily - 4 x weekly -  2 sets - 10 reps - Supine Shoulder Flexion with Dowel  - 1 x daily - 4 x weekly - 2 sets - 10 reps - Sit to Stand  - 1 x daily - 7 x weekly - 2 sets - 10 reps - Romberg Stance  - 1 x daily - 4-5 x weekly - 1 sets - 3-5 reps - 30 sec hold - Wide Tandem Stance with Eyes Open  - 1 x daily - 4-5 x weekly - 1 sets - 3-5 reps - 30 sec hold   GOALS: Goals reviewed with patient? Yes  SHORT TERM GOALS: Target date: 03/05/2024 Pt will be independent with initial HEP for improved strength, balance, transfers and gait as well as for increased independence with management of her pain symptoms. Baseline: independent with initial cervical HEP (2/4) Goal status: MET  2.  Pt will improve 5 x STS to less than or equal  to 23 seconds to demonstrate improved functional strength and transfer efficiency.  Baseline: 26.78 sec BUE (1/7), 24.88 sec no UE (2/4) Goal status: IN PROGRESS  3.  Pt will improve gait velocity to at least 1.75 ft/sec for improved gait efficiency and performance at SBA level  Baseline: 1.57 ft/sec with rollator CGA (1/7), 2.22 ft/sec with rollator SBA (2/4) Goal status: MET  4.  Pt will improve Berg score to 23/56 for decreased fall risk Baseline: 19/56 (1/7), 24/56 (2/4) Goal status: MET  5.  Pt will ambulate>/=316 feet on to demonstrate improved endurance for functional tasks in home and community. Baseline: 216 ft SBA (1/19), 380 ft with rollator SBA (2/4) Goal status: MET   LONG TERM GOALS: Target date: 04/02/2024 Pt will be independent with final HEP for improved strength, balance, transfers and gait as well as for increased independence with management of her pain symptoms. Baseline:  Goal status: INITIAL  2.  Pt will improve 5 x STS to less than or equal to 20 seconds to demonstrate improved functional strength and transfer efficiency.  Baseline: 26.78 sec BUE (1/7), 24.88 sec no UE (2/4) Goal status: INITIAL  3.  Pt will improve normal TUG to less than or equal to 13 seconds for improved functional mobility and decreased fall risk. Baseline: 14.75 sec with rollator CGA (1/7) Goal status: INITIAL  4.  Pt will improve gait velocity to at least 2.5 ft/sec for improved gait efficiency and performance at SBA level  Baseline: 1.57 ft/sec with rollator CGA (1/7), 2.22 ft/sec with rollator SBA (2/4) Goal status: REVISED/UPGRADED  5.  Pt will improve Berg score to 27/56 for decreased fall risk Baseline: 19/56 (1/7), 24/56 (2/4) Goal status: INITIAL  6.  Pt will ambulate>/=416 feet on to demonstrate improved endurance for functional tasks in home and community. Baseline: 216 ft SBA (1/19), 380 ft with rollator SBA (2/4) Goal status: INITIAL  ASSESSMENT:  CLINICAL  IMPRESSION: Emphasis of skilled PT session on assessing STG, continuing to work on safe management of rollator with transfers (sit to/from stand) and turns, and trialing various balance exercises to determine what would be appropriate to add to her HEP. Pt has met 4/5 STG due to being independent with her initial HEP, improving her gait speed from 1.57 ft/sec to 2.22 ft/sec, improving her Berg score from 19/56 to 24/56, and increasing her distance on the from 216 ft to 380 ft. She did improve her 5xSTS time and was able to perform test without UE support (!) however she did not quite improve her time  enough to meet her STG. However, it shows significant improvement in her functional BLE strength that she is able to perform the 5xSTS without any UE support. She continues to struggle with most with static stance with EC, forwards reach, SLS, and L/R turns on the [place] and continues to benefit from skilled practice with this in order to reduce her fall risk. Continue POC.     OBJECTIVE IMPAIRMENTS: Abnormal gait, cardiopulmonary status limiting activity, decreased activity tolerance, decreased balance, decreased endurance, decreased knowledge of use of DME, difficulty walking, decreased ROM, decreased strength, impaired perceived functional ability, impaired UE functional use, improper body mechanics, postural dysfunction, and pain.   ACTIVITY LIMITATIONS: carrying, lifting, bending, standing, squatting, stairs, transfers, bed mobility, and reach over head  PARTICIPATION LIMITATIONS: meal prep, cleaning, laundry, medication management, driving, and community activity  PERSONAL FACTORS: Age, Time since onset of injury/illness/exacerbation, and 3+ comorbidities:   A-fib, CHF, HLD, HTN, MI, pre-diabetes, mild stroke, DDD cervical spineare also affecting patient's functional outcome.   REHAB POTENTIAL: Fair chronic neck pain, chronic balance impairments, age  CLINICAL DECISION MAKING:  Unstable/unpredictable  EVALUATION COMPLEXITY: High  PLAN:  PT FREQUENCY: 2x/week  PT DURATION: 6 weeks  PLANNED INTERVENTIONS: 97164- PT Re-evaluation, 97750- Physical Performance Testing, 97110-Therapeutic exercises, 97530- Therapeutic activity, [id number]- Neuromuscular re-education, 97535- Self Care, [id number]- Manual therapy, [phone]- Gait training, [phone]- Electrical stimulation (manual), [phone] (1-2 muscles), [id number] (3+ muscles)- Dry Needling, Patient/Family education, Balance training, Stair training, Taping, Joint mobilization, Spinal mobilization, DME instructions, Cryotherapy, and Moist heat  PLAN FOR NEXT SESSION: safe rollator use (brake management, where to place hands during transfers), add to HEP as needed for balance/strength  and balance and functional strengthening (step taps), hip strengthening ; sign for rollator (PUSH DOWN to LOCK, PULL UP/SQUEEZE to UNLOCK)  Berg impairments: EC, reach forward, turning around, SLS   Waddell Southgate, PT Waddell Southgate, PT, DPT, CSRS   03/12/2024, 2:48 PM        "

## 2024-03-17 ENCOUNTER — Ambulatory Visit: Admitting: Physical Therapy

## 2024-03-19 ENCOUNTER — Ambulatory Visit: Admitting: Physical Therapy

## 2024-03-24 ENCOUNTER — Ambulatory Visit: Admitting: Physical Therapy

## 2024-03-26 ENCOUNTER — Ambulatory Visit: Admitting: Physical Therapy

## 2024-03-31 ENCOUNTER — Ambulatory Visit: Admitting: Physical Therapy

## 2024-04-02 ENCOUNTER — Ambulatory Visit: Admitting: Physical Therapy

## 2024-08-26 ENCOUNTER — Ambulatory Visit: Admitting: Family Medicine
